# Patient Record
Sex: Female | Born: 1945 | Race: Black or African American | Hispanic: No | Marital: Married | Smoking: Never smoker
Health system: Southern US, Community
[De-identification: ages and names within clinical notes are randomized; demographics above are authoritative.]

## PROBLEM LIST (undated history)

## (undated) ENCOUNTER — Emergency Department: Discharge status: Absent without leave | Payer: Medicare Other

## (undated) DIAGNOSIS — M109 Gout, unspecified: Secondary | ICD-10-CM

## (undated) DIAGNOSIS — K5790 Diverticulosis of intestine, part unspecified, without perforation or abscess without bleeding: Secondary | ICD-10-CM

## (undated) DIAGNOSIS — K648 Other hemorrhoids: Secondary | ICD-10-CM

## (undated) DIAGNOSIS — I509 Heart failure, unspecified: Secondary | ICD-10-CM

## (undated) DIAGNOSIS — I639 Cerebral infarction, unspecified: Secondary | ICD-10-CM

## (undated) DIAGNOSIS — I5042 Chronic combined systolic (congestive) and diastolic (congestive) heart failure: Secondary | ICD-10-CM

## (undated) DIAGNOSIS — G629 Polyneuropathy, unspecified: Secondary | ICD-10-CM

## (undated) DIAGNOSIS — K219 Gastro-esophageal reflux disease without esophagitis: Secondary | ICD-10-CM

## (undated) DIAGNOSIS — Z8673 Personal history of transient ischemic attack (TIA), and cerebral infarction without residual deficits: Secondary | ICD-10-CM

## (undated) DIAGNOSIS — N189 Chronic kidney disease, unspecified: Secondary | ICD-10-CM

## (undated) DIAGNOSIS — E669 Obesity, unspecified: Secondary | ICD-10-CM

## (undated) DIAGNOSIS — M549 Dorsalgia, unspecified: Secondary | ICD-10-CM

## (undated) DIAGNOSIS — K449 Diaphragmatic hernia without obstruction or gangrene: Secondary | ICD-10-CM

## (undated) DIAGNOSIS — G4733 Obstructive sleep apnea (adult) (pediatric): Secondary | ICD-10-CM

## (undated) DIAGNOSIS — I10 Essential (primary) hypertension: Secondary | ICD-10-CM

## (undated) DIAGNOSIS — E119 Type 2 diabetes mellitus without complications: Secondary | ICD-10-CM

## (undated) DIAGNOSIS — I428 Other cardiomyopathies: Secondary | ICD-10-CM

## (undated) DIAGNOSIS — I219 Acute myocardial infarction, unspecified: Secondary | ICD-10-CM

## (undated) DIAGNOSIS — M2550 Pain in unspecified joint: Secondary | ICD-10-CM

## (undated) HISTORY — DX: Obesity, unspecified: E66.9

## (undated) HISTORY — DX: Personal history of transient ischemic attack (TIA), and cerebral infarction without residual deficits: Z86.73

## (undated) HISTORY — DX: Gout, unspecified: M10.9

## (undated) HISTORY — DX: Heart failure, unspecified: I50.9

## (undated) HISTORY — DX: Other cardiomyopathies: I42.8

## (undated) HISTORY — DX: Other hemorrhoids: K64.8

## (undated) HISTORY — DX: Chronic kidney disease, unspecified: N18.9

## (undated) HISTORY — DX: Pain in unspecified joint: M25.50

## (undated) HISTORY — DX: Cerebral infarction, unspecified: I63.9

## (undated) HISTORY — DX: Gastro-esophageal reflux disease without esophagitis: K21.9

## (undated) HISTORY — PX: EYE SURGERY: SHX253

## (undated) HISTORY — PX: CARDIAC CATHETERIZATION: SHX172

## (undated) HISTORY — DX: Diaphragmatic hernia without obstruction or gangrene: K44.9

## (undated) HISTORY — DX: Diverticulosis of intestine, part unspecified, without perforation or abscess without bleeding: K57.90

## (undated) HISTORY — DX: Obstructive sleep apnea (adult) (pediatric): G47.33

## (undated) HISTORY — DX: Polyneuropathy, unspecified: G62.9

## (undated) HISTORY — DX: Acute myocardial infarction, unspecified: I21.9

## (undated) HISTORY — DX: Dorsalgia, unspecified: M54.9

---

## 1972-04-26 HISTORY — PX: TUBAL LIGATION: SHX77

## 1997-11-15 ENCOUNTER — Other Ambulatory Visit: Admission: RE | Admit: 1997-11-15 | Discharge: 1997-11-15 | Payer: Self-pay | Admitting: Obstetrics and Gynecology

## 1998-03-25 ENCOUNTER — Other Ambulatory Visit: Admission: RE | Admit: 1998-03-25 | Discharge: 1998-03-25 | Payer: Self-pay | Admitting: Obstetrics and Gynecology

## 1998-04-26 HISTORY — PX: ABDOMINAL HYSTERECTOMY: SHX81

## 1998-12-30 ENCOUNTER — Ambulatory Visit (HOSPITAL_COMMUNITY): Admission: RE | Admit: 1998-12-30 | Discharge: 1998-12-30 | Payer: Self-pay | Admitting: Obstetrics and Gynecology

## 1998-12-30 ENCOUNTER — Encounter: Payer: Self-pay | Admitting: Obstetrics and Gynecology

## 1999-02-18 ENCOUNTER — Encounter (INDEPENDENT_AMBULATORY_CARE_PROVIDER_SITE_OTHER): Payer: Self-pay

## 1999-02-18 ENCOUNTER — Inpatient Hospital Stay (HOSPITAL_COMMUNITY): Admission: RE | Admit: 1999-02-18 | Discharge: 1999-02-20 | Payer: Self-pay | Admitting: Obstetrics and Gynecology

## 2000-01-04 ENCOUNTER — Encounter: Admission: RE | Admit: 2000-01-04 | Discharge: 2000-01-04 | Payer: Self-pay | Admitting: Obstetrics and Gynecology

## 2000-01-04 ENCOUNTER — Encounter: Payer: Self-pay | Admitting: Obstetrics and Gynecology

## 2000-01-12 ENCOUNTER — Encounter: Admission: RE | Admit: 2000-01-12 | Discharge: 2000-01-12 | Payer: Self-pay | Admitting: Obstetrics and Gynecology

## 2000-01-12 ENCOUNTER — Encounter: Payer: Self-pay | Admitting: Obstetrics and Gynecology

## 2000-05-19 ENCOUNTER — Encounter: Payer: Self-pay | Admitting: Endocrinology

## 2000-05-19 ENCOUNTER — Ambulatory Visit (HOSPITAL_COMMUNITY): Admission: RE | Admit: 2000-05-19 | Discharge: 2000-05-19 | Payer: Self-pay | Admitting: Endocrinology

## 2000-10-25 ENCOUNTER — Ambulatory Visit (HOSPITAL_COMMUNITY): Admission: RE | Admit: 2000-10-25 | Discharge: 2000-10-25 | Payer: Self-pay | Admitting: Endocrinology

## 2000-10-25 ENCOUNTER — Encounter: Payer: Self-pay | Admitting: Endocrinology

## 2001-01-13 ENCOUNTER — Encounter: Payer: Self-pay | Admitting: Obstetrics and Gynecology

## 2001-01-13 ENCOUNTER — Encounter: Admission: RE | Admit: 2001-01-13 | Discharge: 2001-01-13 | Payer: Self-pay | Admitting: Obstetrics and Gynecology

## 2001-01-26 ENCOUNTER — Encounter: Admission: RE | Admit: 2001-01-26 | Discharge: 2001-01-26 | Payer: Self-pay | Admitting: Endocrinology

## 2001-01-26 ENCOUNTER — Encounter: Payer: Self-pay | Admitting: Endocrinology

## 2001-03-14 ENCOUNTER — Other Ambulatory Visit: Admission: RE | Admit: 2001-03-14 | Discharge: 2001-03-14 | Payer: Self-pay | Admitting: Obstetrics and Gynecology

## 2002-01-30 ENCOUNTER — Encounter: Payer: Self-pay | Admitting: Endocrinology

## 2002-01-30 ENCOUNTER — Encounter: Admission: RE | Admit: 2002-01-30 | Discharge: 2002-01-30 | Payer: Self-pay | Admitting: Endocrinology

## 2002-02-21 ENCOUNTER — Encounter (INDEPENDENT_AMBULATORY_CARE_PROVIDER_SITE_OTHER): Payer: Self-pay | Admitting: *Deleted

## 2002-02-21 ENCOUNTER — Ambulatory Visit (HOSPITAL_COMMUNITY): Admission: RE | Admit: 2002-02-21 | Discharge: 2002-02-21 | Payer: Self-pay | Admitting: *Deleted

## 2002-11-26 ENCOUNTER — Encounter: Payer: Self-pay | Admitting: Orthopedic Surgery

## 2002-11-27 ENCOUNTER — Ambulatory Visit (HOSPITAL_COMMUNITY): Admission: RE | Admit: 2002-11-27 | Discharge: 2002-11-28 | Payer: Self-pay | Admitting: Orthopedic Surgery

## 2003-02-20 ENCOUNTER — Encounter: Admission: RE | Admit: 2003-02-20 | Discharge: 2003-02-20 | Payer: Self-pay | Admitting: Endocrinology

## 2003-04-27 HISTORY — PX: ACHILLES TENDON REPAIR: SUR1153

## 2003-04-27 HISTORY — PX: KNEE SURGERY: SHX244

## 2003-10-24 ENCOUNTER — Encounter: Admission: RE | Admit: 2003-10-24 | Discharge: 2003-10-24 | Payer: Self-pay

## 2003-12-03 ENCOUNTER — Observation Stay (HOSPITAL_COMMUNITY): Admission: RE | Admit: 2003-12-03 | Discharge: 2003-12-04 | Payer: Self-pay | Admitting: Orthopedic Surgery

## 2005-02-16 ENCOUNTER — Encounter: Admission: RE | Admit: 2005-02-16 | Discharge: 2005-02-16 | Payer: Self-pay | Admitting: Endocrinology

## 2005-10-07 ENCOUNTER — Encounter: Admission: RE | Admit: 2005-10-07 | Discharge: 2006-01-05 | Payer: Self-pay | Admitting: Surgery

## 2005-10-14 ENCOUNTER — Encounter (INDEPENDENT_AMBULATORY_CARE_PROVIDER_SITE_OTHER): Payer: Self-pay | Admitting: Specialist

## 2005-10-14 ENCOUNTER — Ambulatory Visit (HOSPITAL_COMMUNITY): Admission: RE | Admit: 2005-10-14 | Discharge: 2005-10-14 | Payer: Self-pay | Admitting: *Deleted

## 2005-10-15 ENCOUNTER — Ambulatory Visit (HOSPITAL_COMMUNITY): Admission: RE | Admit: 2005-10-15 | Discharge: 2005-10-15 | Payer: Self-pay | Admitting: Surgery

## 2005-10-18 ENCOUNTER — Ambulatory Visit (HOSPITAL_COMMUNITY): Admission: RE | Admit: 2005-10-18 | Discharge: 2005-10-18 | Payer: Self-pay | Admitting: Surgery

## 2005-11-15 ENCOUNTER — Ambulatory Visit (HOSPITAL_COMMUNITY): Admission: RE | Admit: 2005-11-15 | Discharge: 2005-11-15 | Payer: Self-pay | Admitting: Surgery

## 2005-12-29 ENCOUNTER — Ambulatory Visit (HOSPITAL_COMMUNITY): Admission: RE | Admit: 2005-12-29 | Discharge: 2005-12-29 | Payer: Self-pay | Admitting: *Deleted

## 2006-01-31 ENCOUNTER — Ambulatory Visit (HOSPITAL_COMMUNITY): Admission: RE | Admit: 2006-01-31 | Discharge: 2006-01-31 | Payer: Self-pay | Admitting: *Deleted

## 2006-11-25 ENCOUNTER — Encounter: Admission: RE | Admit: 2006-11-25 | Discharge: 2006-11-25 | Payer: Self-pay | Admitting: Endocrinology

## 2008-03-05 ENCOUNTER — Encounter: Admission: RE | Admit: 2008-03-05 | Discharge: 2008-04-25 | Payer: Self-pay

## 2008-04-29 ENCOUNTER — Encounter: Admission: RE | Admit: 2008-04-29 | Discharge: 2008-05-02 | Payer: Self-pay

## 2008-05-16 ENCOUNTER — Encounter: Admission: RE | Admit: 2008-05-16 | Discharge: 2008-05-16 | Payer: Self-pay | Admitting: Endocrinology

## 2008-07-09 ENCOUNTER — Ambulatory Visit: Payer: Self-pay | Admitting: Pulmonary Disease

## 2008-07-09 ENCOUNTER — Ambulatory Visit: Payer: Self-pay | Admitting: Cardiology

## 2008-07-09 ENCOUNTER — Inpatient Hospital Stay (HOSPITAL_COMMUNITY): Admission: EM | Admit: 2008-07-09 | Discharge: 2008-07-18 | Payer: Self-pay | Admitting: Emergency Medicine

## 2008-07-10 ENCOUNTER — Encounter (INDEPENDENT_AMBULATORY_CARE_PROVIDER_SITE_OTHER): Payer: Self-pay | Admitting: Internal Medicine

## 2008-11-24 DIAGNOSIS — Z8673 Personal history of transient ischemic attack (TIA), and cerebral infarction without residual deficits: Secondary | ICD-10-CM

## 2008-11-24 HISTORY — DX: Personal history of transient ischemic attack (TIA), and cerebral infarction without residual deficits: Z86.73

## 2008-12-12 ENCOUNTER — Encounter: Admission: RE | Admit: 2008-12-12 | Discharge: 2008-12-12 | Payer: Self-pay | Admitting: Endocrinology

## 2009-04-28 ENCOUNTER — Encounter: Admission: RE | Admit: 2009-04-28 | Discharge: 2009-04-28 | Payer: Self-pay | Admitting: Endocrinology

## 2009-06-04 ENCOUNTER — Emergency Department (HOSPITAL_COMMUNITY): Admission: EM | Admit: 2009-06-04 | Discharge: 2009-06-04 | Payer: Self-pay | Admitting: Emergency Medicine

## 2009-12-17 ENCOUNTER — Ambulatory Visit: Payer: Self-pay | Admitting: Cardiology

## 2009-12-17 ENCOUNTER — Telehealth (INDEPENDENT_AMBULATORY_CARE_PROVIDER_SITE_OTHER): Payer: Self-pay | Admitting: *Deleted

## 2010-01-26 ENCOUNTER — Ambulatory Visit: Payer: Self-pay | Admitting: Cardiology

## 2010-01-28 IMAGING — CR DG CHEST 2V
2 series · 2 of 2 positions shown · non-contrast
Comparison: 07/13/2008.

CLINICAL DATA: Respiratory failure.

CHEST - 2 VIEW

[w chest pa]
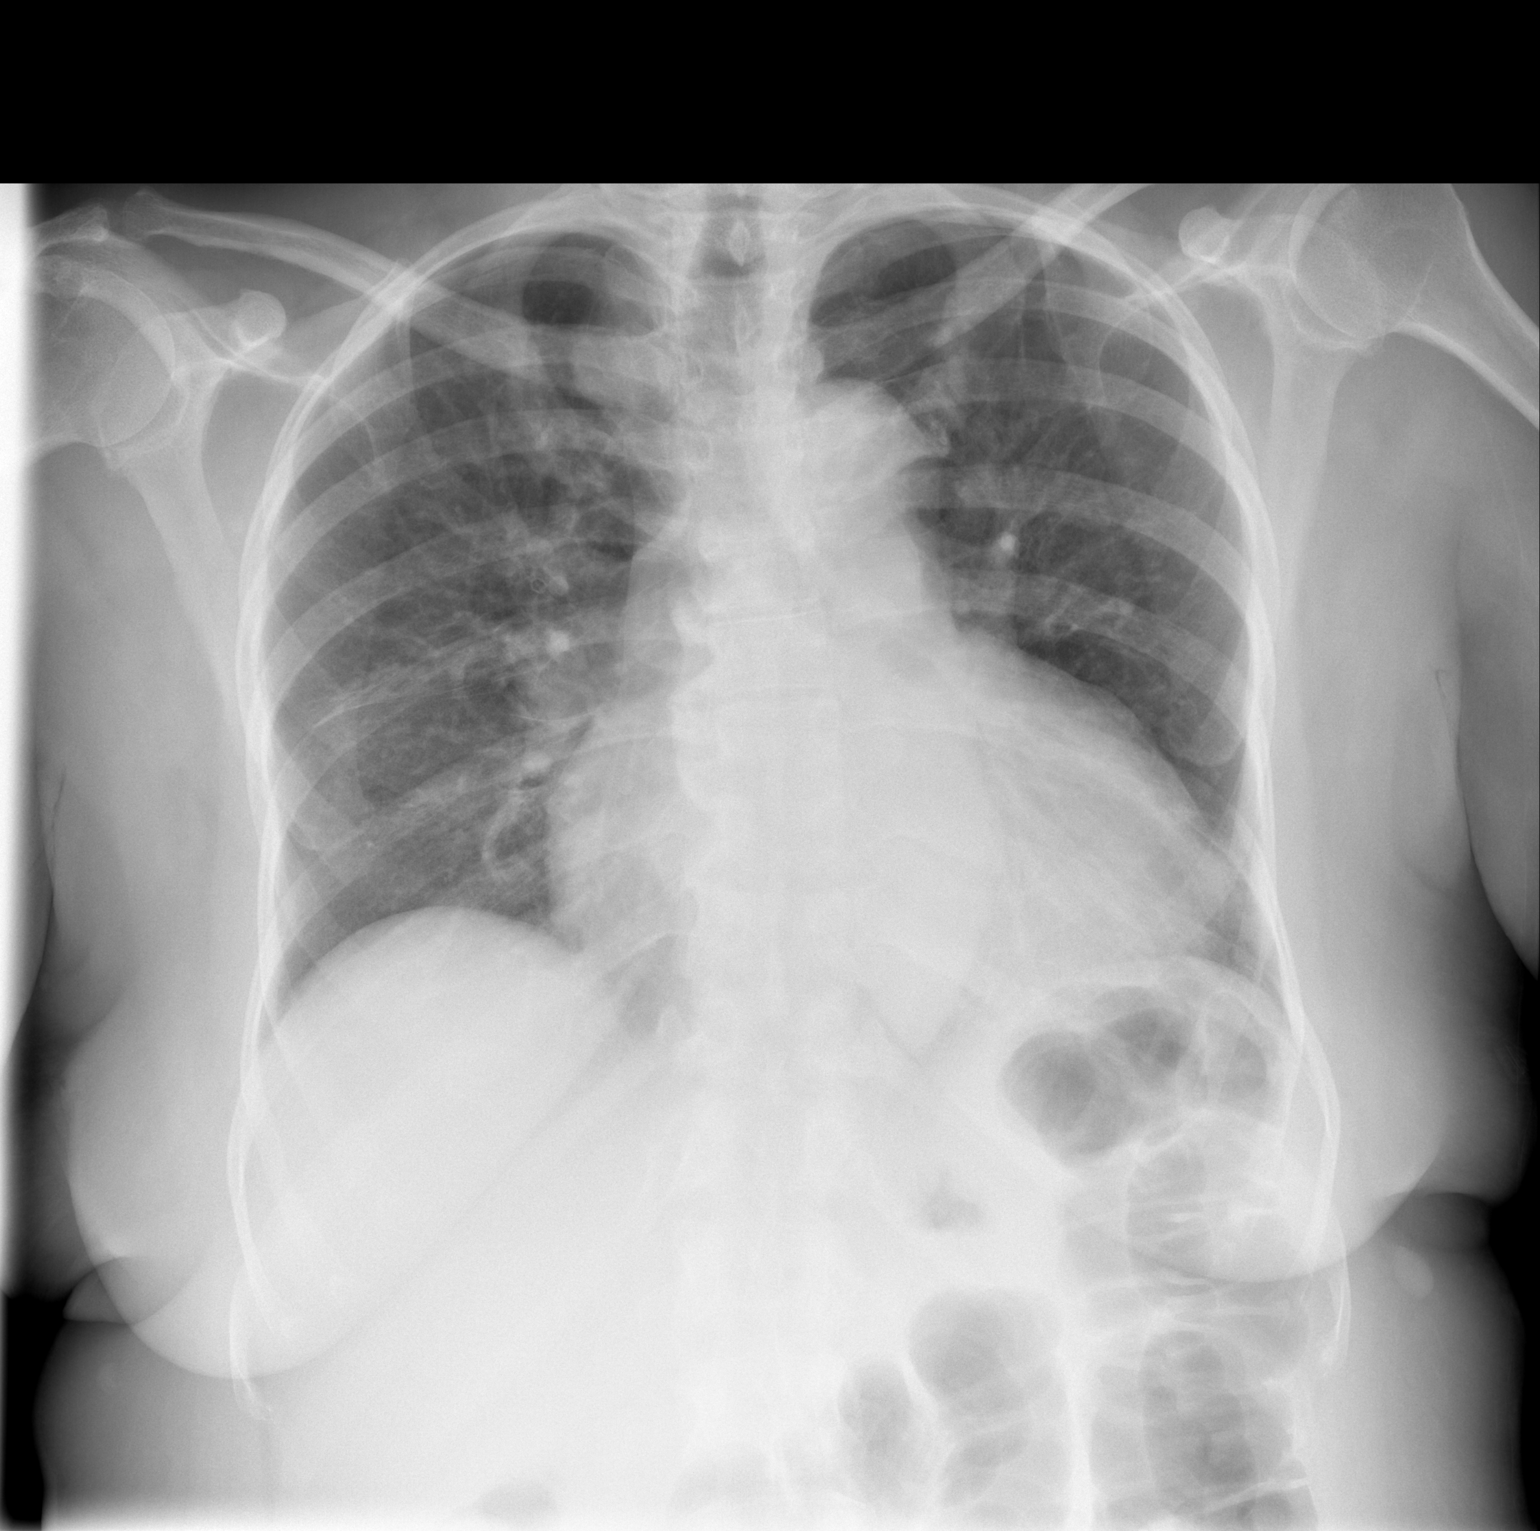

[w chest lat]
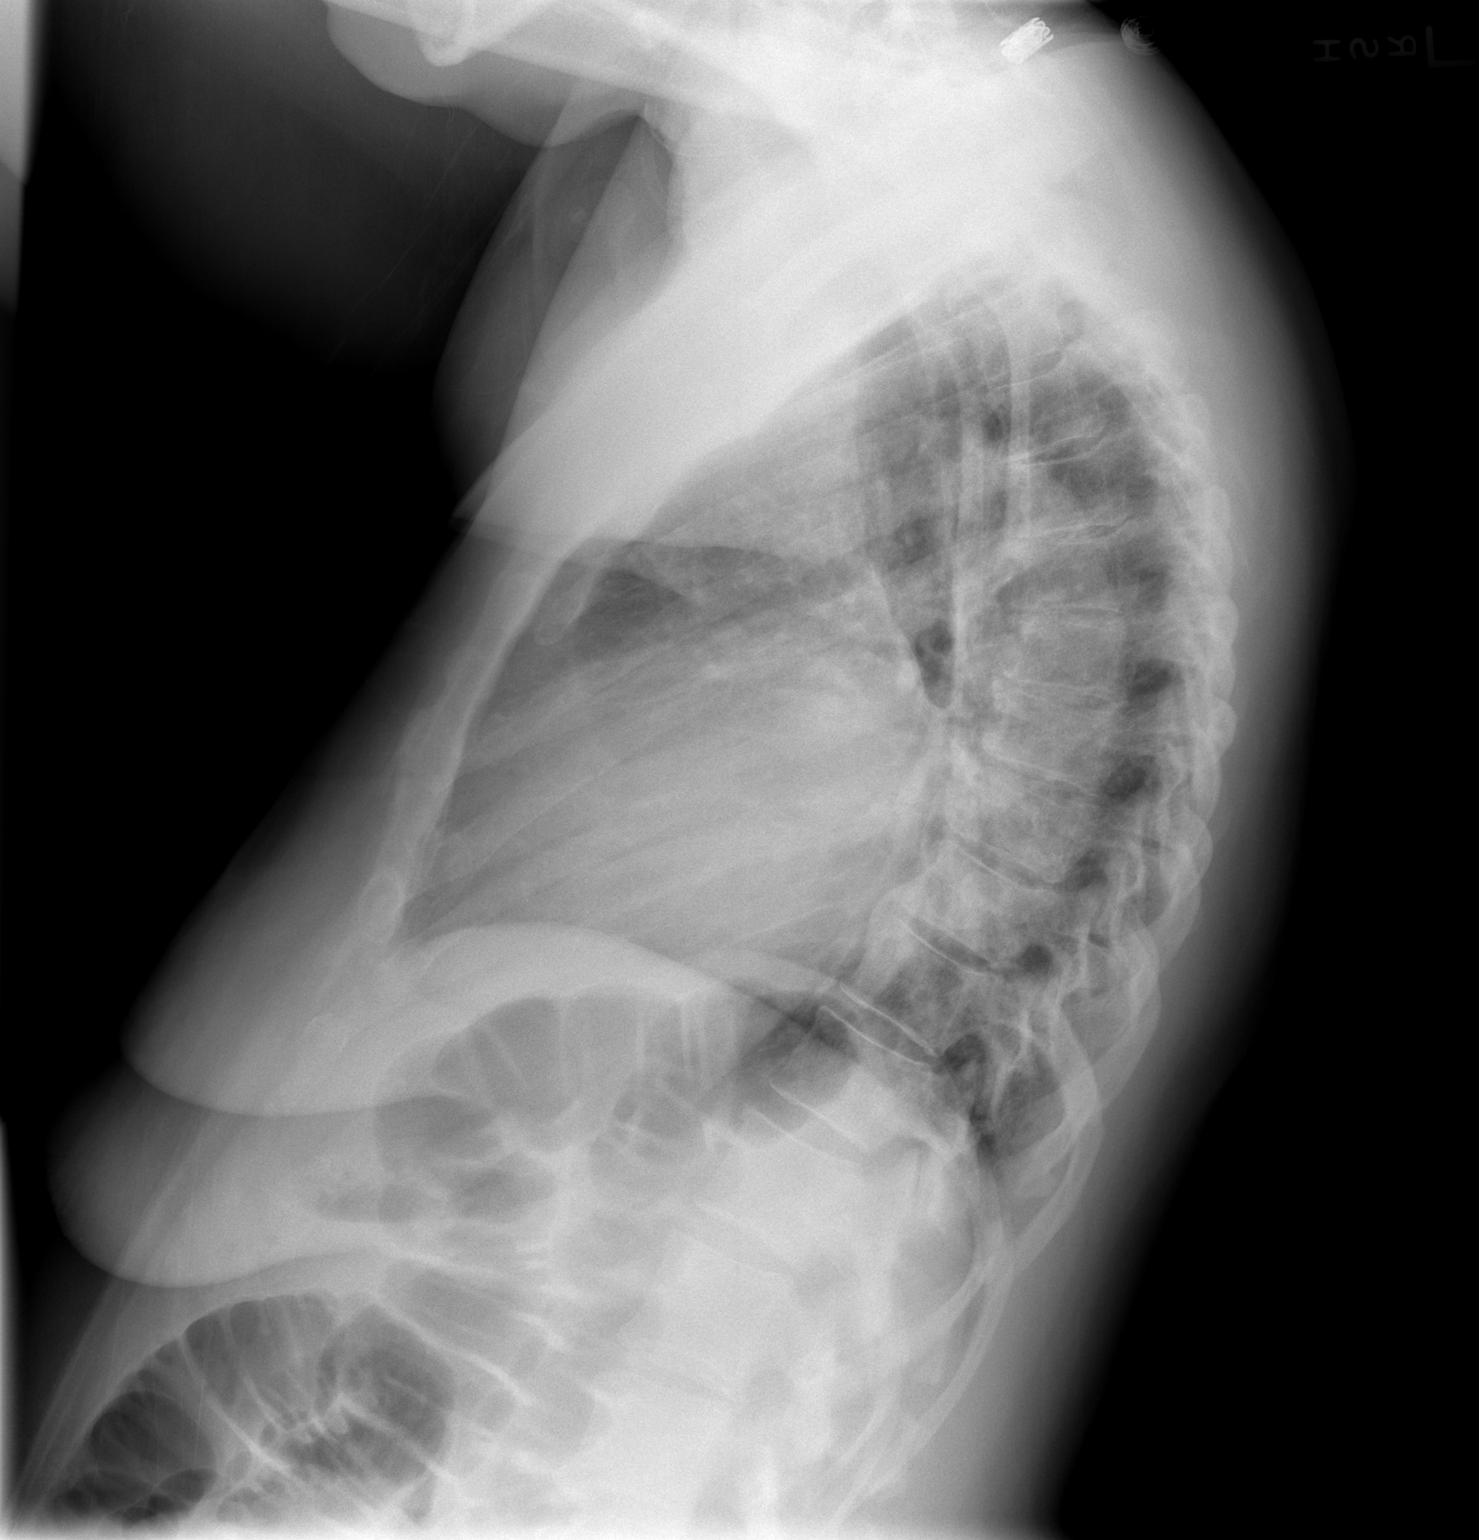

[2 of 2 positions shown; findings below may reference images not displayed]

FINDINGS: The heart is enlarged.  There is improvement in vascular
congestion.  There is no infiltrate or edema.  Negative for pleural
effusion.
IMPRESSION: Interval improvement in cardiac enlargement  and  pulmonary
vascular congestion.

## 2010-01-29 ENCOUNTER — Telehealth (INDEPENDENT_AMBULATORY_CARE_PROVIDER_SITE_OTHER): Payer: Self-pay | Admitting: Radiology

## 2010-01-30 IMAGING — CR DG CHEST 1V PORT
1 series · 1 of 1 positions shown · non-contrast
Comparison: 07/15/2008.

CLINICAL DATA: Respiratory failure.

PORTABLE CHEST - 1 VIEW

[AP]
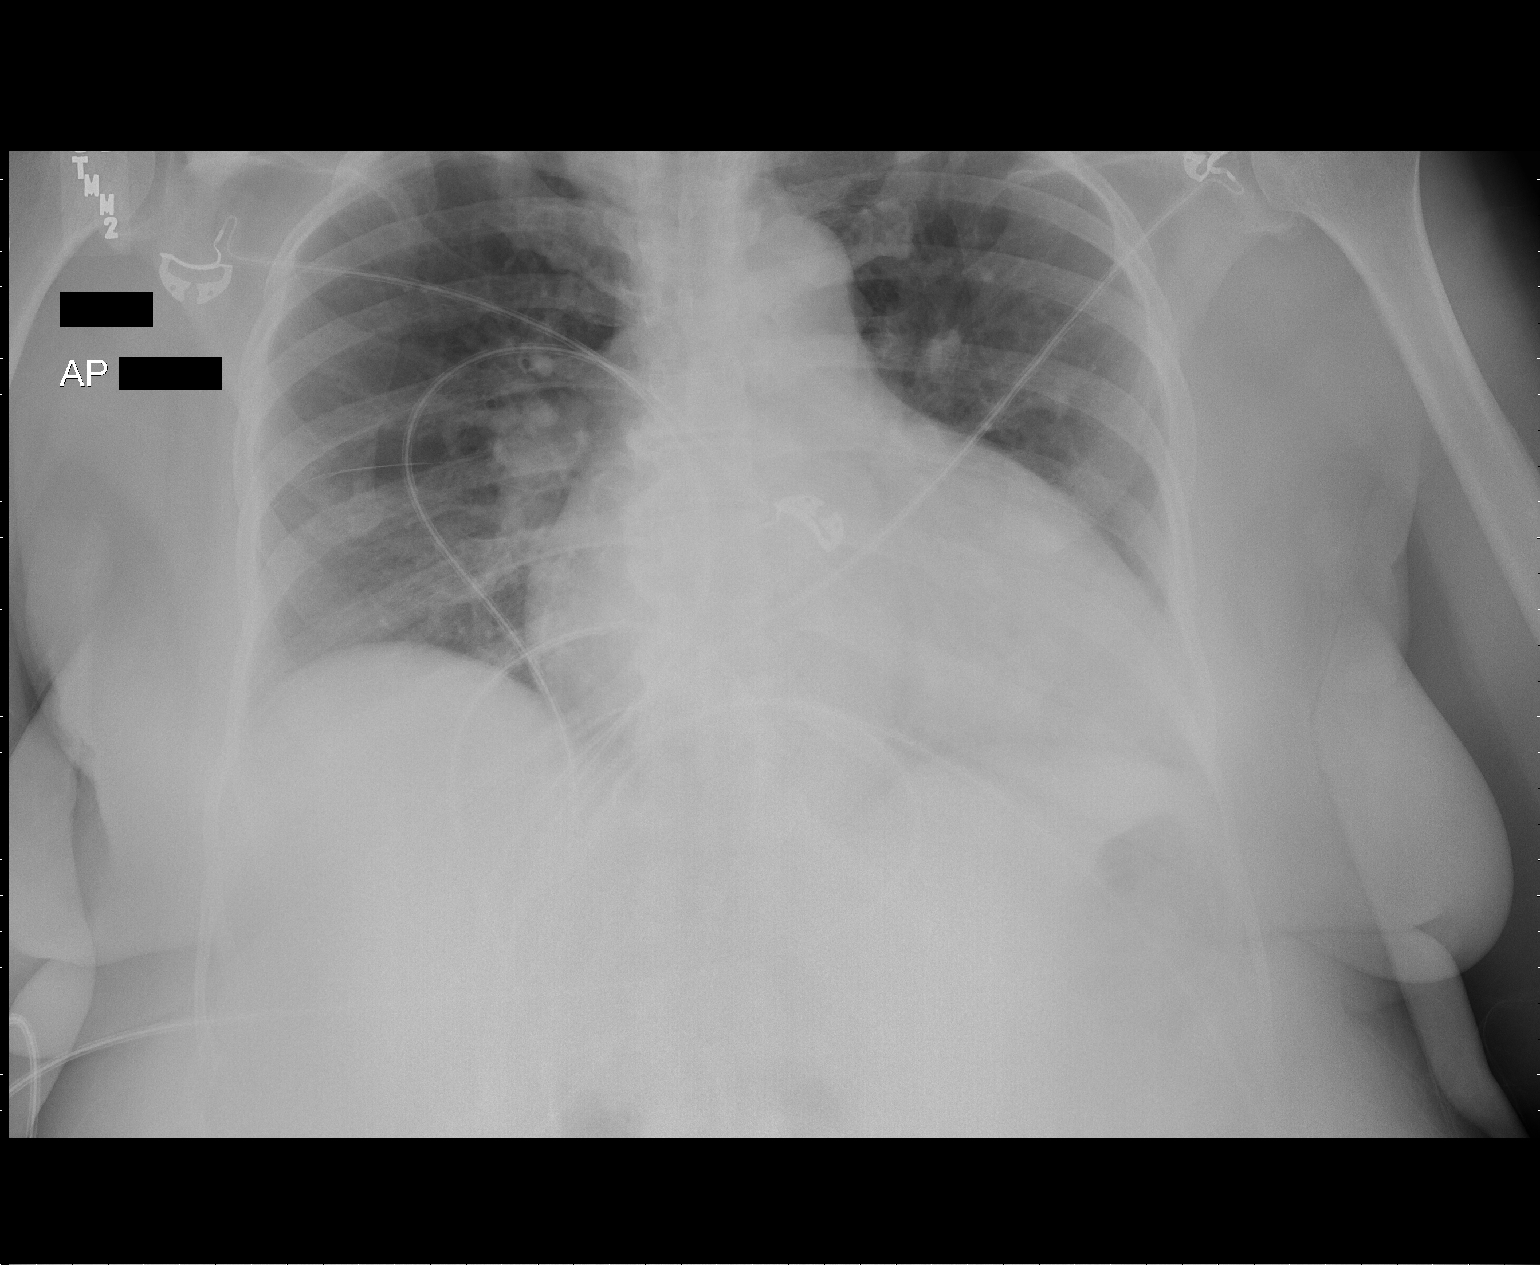

[1 of 1 positions shown; findings below may reference images not displayed]

FINDINGS: The cardiopericardial silhouette is enlarged.  Lung
volumes are low.  Moderate pulmonary vascular congestion is not
significantly changed.  No focal airspace disease is seen.
IMPRESSION: 1.  Stable cardiomegaly and moderate pulmonary vascular congestion.
2.  Low lung volumes.

## 2010-02-02 ENCOUNTER — Encounter: Payer: Self-pay | Admitting: Cardiology

## 2010-02-02 ENCOUNTER — Encounter (HOSPITAL_COMMUNITY)
Admission: RE | Admit: 2010-02-02 | Discharge: 2010-02-20 | Payer: Self-pay | Source: Home / Self Care | Admitting: Cardiology

## 2010-02-02 ENCOUNTER — Ambulatory Visit: Payer: Self-pay

## 2010-02-02 ENCOUNTER — Ambulatory Visit: Payer: Self-pay | Admitting: Internal Medicine

## 2010-02-05 ENCOUNTER — Ambulatory Visit: Payer: Self-pay

## 2010-02-23 ENCOUNTER — Ambulatory Visit: Payer: Self-pay | Admitting: Cardiology

## 2010-03-02 ENCOUNTER — Ambulatory Visit: Payer: Self-pay | Admitting: Cardiology

## 2010-03-10 ENCOUNTER — Ambulatory Visit: Payer: Self-pay | Admitting: Cardiology

## 2010-03-23 ENCOUNTER — Ambulatory Visit: Payer: Self-pay | Admitting: Cardiology

## 2010-05-18 ENCOUNTER — Encounter: Payer: Self-pay | Admitting: Endocrinology

## 2010-05-26 NOTE — Progress Notes (Signed)
  Recieved Records from [facility] gave to [place]  December 17, 2009 8:04 AM     Appended Document:  Records Recieved from [facility] gave to [place]:  These records are supposed to go to [facility] not here, faxed over to [facility]

## 2010-05-26 NOTE — Progress Notes (Signed)
Summary: nuc pre-procedure  Phone Note Outgoing Call   Call placed by: Charlton Amor, CNMT,  January 29, 2010 3:32 PM Call placed to: Patient Summary of Call: Left message with information on Myoview Information Sheet (see scanned document for details).  Initial call taken by: Charlton Amor, CNMT,  January 29, 2010 3:32 PM     Nuclear Med Background Indications for Stress Test: Evaluation for Ischemia, Surgical Clearance   History: Heart Catheterization, Myocardial Infarction  History Comments: 3/10 MI / Cath totalled LAD / CFX Med Tx CHF     Nuclear Pre-Procedure Cardiac Risk Factors: CVA, Hypertension

## 2010-05-26 NOTE — Assessment & Plan Note (Signed)
Summary: Cardiology Nuclear Testing  Nuclear Med Background Indications for Stress Test: Evaluation for Ischemia, Surgical Clearance   History: Heart Catheterization, Myocardial Infarction  History Comments: 3/10 MI / Cath totalled LAD / CFX Med Tx CHF     Nuclear Pre-Procedure Cardiac Risk Factors: CVA, Hypertension Caffeine/Decaff Intake: None NPO After: 8:30 PM IV 0.9% NS with Angio Cath: 22g     IV Site: R Antecubital IV Started by: Irven Baltimore, RN Chest Size (in) 42     Cup Size B     Height (in): 62.5 Weight (lb): 255 BMI: 46.06 Tech Comments: Held carvedilol this am.  Nuclear Med Study 1 or 2 day study:  2 day     Stress Test Type:  Carlton Adam Reading MD:  Kirk Ruths, MD     Referring MD:  Bea Laura Resting Radionuclide:  Technetium 25m Tetrofosmin     Resting Radionuclide Dose:  32.1 mCi  Stress Radionuclide:  Technetium 91m Tetrofosmin     Stress Radionuclide Dose:  33 mCi   Stress Protocol     Stress Test Technologist:  Ileene Hutchinson, EMT-P     Nuclear Technologist:  Annye Rusk, CNMT  Rest Procedure  Myocardial perfusion imaging was performed at rest 45 minutes following the intravenous administration of Technetium 86m Tetrofosmin.  Stress Procedure  The patient received IV Lexiscan 0.4 mg over 15-seconds.  Technetium 43m Tetrofosmin injected at 30-seconds.  There were nonspecific ST-T wave changes with infusion.  Quantitative spect images were obtained after a 45 minute delay.  QPS Raw Data Images:  There is no interference from other nuclear activity. Stress Images:  There is decreased uptake in the inferior wall Rest Images:  There is decreased uptake in the inferior wall, less prominent compared to the stress images. Subtraction (SDS):  These findings are consistent with prior inferior infarct and very mild peri-infarct ischemia. Transient Ischemic Dilatation:  .94  (Normal <1.22)  Lung/Heart Ratio:  .40  (Normal <0.45)  Quantitative Gated  Spect Images QGS EDV:  179 ml QGS ESV:  122 ml QGS EF:  32 % QGS cine images:  Inferior akinesis; evidence of left ventricular enlargement.   Overall Impression  Exercise Capacity: Lexiscan with no exercise. BP Response: Normal blood pressure response. Clinical Symptoms: No chest pain ECG Impression: No significant ST segment change suggestive of ischemia. Overall Impression: Abnormal lexiscan nuclear study with prior inferior infarct and very mild peri-infarct ischemia.  Appended Document: Cardiology Nuclear Testing copy sent to Dr. Doreatha Lew

## 2010-07-08 ENCOUNTER — Ambulatory Visit (INDEPENDENT_AMBULATORY_CARE_PROVIDER_SITE_OTHER): Payer: Federal, State, Local not specified - PPO | Admitting: Cardiology

## 2010-07-08 DIAGNOSIS — R0602 Shortness of breath: Secondary | ICD-10-CM

## 2010-07-08 DIAGNOSIS — I2510 Atherosclerotic heart disease of native coronary artery without angina pectoris: Secondary | ICD-10-CM

## 2010-08-06 LAB — COMPREHENSIVE METABOLIC PANEL
ALT: 605 U/L — ABNORMAL HIGH (ref 0–35)
AST: 1102 U/L — ABNORMAL HIGH (ref 0–37)
AST: 472 U/L — ABNORMAL HIGH (ref 0–37)
Albumin: 2.7 g/dL — ABNORMAL LOW (ref 3.5–5.2)
Albumin: 3 g/dL — ABNORMAL LOW (ref 3.5–5.2)
Alkaline Phosphatase: 80 U/L (ref 39–117)
Alkaline Phosphatase: 84 U/L (ref 39–117)
BUN: 19 mg/dL (ref 6–23)
BUN: 26 mg/dL — ABNORMAL HIGH (ref 6–23)
CO2: 29 mEq/L (ref 19–32)
Calcium: 7 mg/dL — ABNORMAL LOW (ref 8.4–10.5)
Calcium: 7.9 mg/dL — ABNORMAL LOW (ref 8.4–10.5)
Chloride: 105 mEq/L (ref 96–112)
Creatinine, Ser: 1.53 mg/dL — ABNORMAL HIGH (ref 0.4–1.2)
Creatinine, Ser: 1.63 mg/dL — ABNORMAL HIGH (ref 0.4–1.2)
GFR calc Af Amer: 39 mL/min — ABNORMAL LOW (ref >60)
GFR calc Af Amer: 41 mL/min — ABNORMAL LOW (ref >60)
GFR calc non Af Amer: 32 mL/min — ABNORMAL LOW (ref >60)
Glucose, Bld: 122 mg/dL — ABNORMAL HIGH (ref 70–99)
Glucose, Bld: 417 mg/dL — ABNORMAL HIGH (ref 70–99)
Potassium: 3.5 mEq/L (ref 3.5–5.1)
Potassium: 5.1 mEq/L (ref 3.5–5.1)
Sodium: 142 mEq/L (ref 135–145)
Total Bilirubin: 0.5 mg/dL (ref 0.3–1.2)
Total Protein: 6.3 g/dL (ref 6.0–8.3)
Total Protein: 6.4 g/dL (ref 6.0–8.3)

## 2010-08-06 LAB — HEPARIN LEVEL (UNFRACTIONATED)
Heparin Unfractionated: 0.29 IU/mL — ABNORMAL LOW (ref 0.30–0.70)
Heparin Unfractionated: 0.38 IU/mL (ref 0.30–0.70)
Heparin Unfractionated: 0.39 IU/mL (ref 0.30–0.70)
Heparin Unfractionated: 0.52 IU/mL (ref 0.30–0.70)
Heparin Unfractionated: 0.63 IU/mL (ref 0.30–0.70)
Heparin Unfractionated: <0.1 IU/mL — ABNORMAL LOW (ref 0.30–0.70)

## 2010-08-06 LAB — URINE CULTURE
Colony Count: 2000
Culture: NO GROWTH

## 2010-08-06 LAB — POCT I-STAT 3, ART BLOOD GAS (G3+)
Acid-Base Excess: 2 mmol/L (ref 0.0–2.0)
Acid-Base Excess: 3 mmol/L — ABNORMAL HIGH (ref 0.0–2.0)
Acid-Base Excess: 3 mmol/L — ABNORMAL HIGH (ref 0.0–2.0)
Acid-Base Excess: 4 mmol/L — ABNORMAL HIGH (ref 0.0–2.0)
Bicarbonate: 11.7 mEq/L — ABNORMAL LOW (ref 20.0–24.0)
Bicarbonate: 22.8 mEq/L (ref 20.0–24.0)
Bicarbonate: 26.1 mEq/L — ABNORMAL HIGH (ref 20.0–24.0)
Bicarbonate: 27.9 mEq/L — ABNORMAL HIGH (ref 20.0–24.0)
O2 Saturation: 100 %
O2 Saturation: 97 %
O2 Saturation: 98 %
O2 Saturation: 99 %
O2 Saturation: 99 %
Patient temperature: 98.2
Patient temperature: 99
TCO2: 12 mmol/L (ref 0–100)
TCO2: 24 mmol/L (ref 0–100)
TCO2: 29 mmol/L (ref 0–100)
TCO2: 30 mmol/L (ref 0–100)
pCO2 arterial: 44.6 mmHg (ref 35.0–45.0)
pCO2 arterial: 55.3 mmHg — ABNORMAL HIGH (ref 35.0–45.0)
pH, Arterial: 7.193 — CL (ref 7.350–7.400)
pO2, Arterial: 110 mmHg — ABNORMAL HIGH (ref 80.0–100.0)
pO2, Arterial: 129 mmHg — ABNORMAL HIGH (ref 80.0–100.0)
pO2, Arterial: 145 mmHg — ABNORMAL HIGH (ref 80.0–100.0)
pO2, Arterial: 166 mmHg — ABNORMAL HIGH (ref 80.0–100.0)
pO2, Arterial: 33 mmHg — CL (ref 80.0–100.0)

## 2010-08-06 LAB — URINALYSIS, ROUTINE W REFLEX MICROSCOPIC
Bilirubin Urine: NEGATIVE
Bilirubin Urine: NEGATIVE
Glucose, UA: 250 mg/dL — AB
Glucose, UA: NEGATIVE mg/dL
Ketones, ur: NEGATIVE mg/dL
Nitrite: NEGATIVE
Specific Gravity, Urine: 1.009 (ref 1.005–1.030)
Urobilinogen, UA: 0.2 mg/dL (ref 0.0–1.0)
pH: 6 (ref 5.0–8.0)

## 2010-08-06 LAB — CBC
HCT: 25.9 % — ABNORMAL LOW (ref 36.0–46.0)
HCT: 28.7 % — ABNORMAL LOW (ref 36.0–46.0)
HCT: 30.4 % — ABNORMAL LOW (ref 36.0–46.0)
HCT: 31.9 % — ABNORMAL LOW (ref 36.0–46.0)
HCT: 33.1 % — ABNORMAL LOW (ref 36.0–46.0)
Hemoglobin: 10.7 g/dL — ABNORMAL LOW (ref 12.0–15.0)
Hemoglobin: 10.9 g/dL — ABNORMAL LOW (ref 12.0–15.0)
Hemoglobin: 11.2 g/dL — ABNORMAL LOW (ref 12.0–15.0)
Hemoglobin: 8.7 g/dL — ABNORMAL LOW (ref 12.0–15.0)
Hemoglobin: 9.4 g/dL — ABNORMAL LOW (ref 12.0–15.0)
Hemoglobin: 9.8 g/dL — ABNORMAL LOW (ref 12.0–15.0)
MCHC: 32 g/dL (ref 30.0–36.0)
MCHC: 32 g/dL (ref 30.0–36.0)
MCHC: 32.3 g/dL (ref 30.0–36.0)
MCHC: 32.4 g/dL (ref 30.0–36.0)
MCHC: 32.6 g/dL (ref 30.0–36.0)
MCHC: 32.9 g/dL (ref 30.0–36.0)
MCHC: 33.2 g/dL (ref 30.0–36.0)
MCHC: 35 g/dL (ref 30.0–36.0)
MCV: 75.9 fL — ABNORMAL LOW (ref 78.0–100.0)
MCV: 76.3 fL — ABNORMAL LOW (ref 78.0–100.0)
MCV: 76.3 fL — ABNORMAL LOW (ref 78.0–100.0)
MCV: 76.4 fL — ABNORMAL LOW (ref 78.0–100.0)
MCV: 76.7 fL — ABNORMAL LOW (ref 78.0–100.0)
MCV: 77.5 fL — ABNORMAL LOW (ref 78.0–100.0)
Platelets: 152 10*3/uL (ref 150–400)
Platelets: 190 10*3/uL (ref 150–400)
Platelets: 197 10*3/uL (ref 150–400)
Platelets: 237 10*3/uL (ref 150–400)
Platelets: 281 10*3/uL (ref 150–400)
Platelets: 347 10*3/uL (ref 150–400)
RBC: 3.54 MIL/uL — ABNORMAL LOW (ref 3.87–5.11)
RBC: 3.78 MIL/uL — ABNORMAL LOW (ref 3.87–5.11)
RBC: 4.21 MIL/uL (ref 3.87–5.11)
RBC: 4.28 MIL/uL (ref 3.87–5.11)
RDW: 17.9 % — ABNORMAL HIGH (ref 11.5–15.5)
RDW: 18 % — ABNORMAL HIGH (ref 11.5–15.5)
RDW: 18.5 % — ABNORMAL HIGH (ref 11.5–15.5)
RDW: 18.7 % — ABNORMAL HIGH (ref 11.5–15.5)
RDW: 18.9 % — ABNORMAL HIGH (ref 11.5–15.5)
WBC: 10.5 10*3/uL (ref 4.0–10.5)
WBC: 11.3 10*3/uL — ABNORMAL HIGH (ref 4.0–10.5)
WBC: 9.2 10*3/uL (ref 4.0–10.5)
WBC: 9.6 10*3/uL (ref 4.0–10.5)

## 2010-08-06 LAB — CARDIAC PANEL(CRET KIN+CKTOT+MB+TROPI)
CK, MB: 2 ng/mL (ref 0.3–4.0)
CK, MB: 2 ng/mL (ref 0.3–4.0)
Relative Index: 1.7 (ref 0.0–2.5)
Relative Index: 1.8 (ref 0.0–2.5)
Total CK: 109 U/L (ref 7–177)
Total CK: 118 U/L (ref 7–177)
Total CK: 2033 U/L — ABNORMAL HIGH (ref 7–177)
Total CK: 2346 U/L — ABNORMAL HIGH (ref 7–177)
Total CK: 2430 U/L — ABNORMAL HIGH (ref 7–177)
Troponin I: 0.27 ng/mL — ABNORMAL HIGH (ref 0.00–0.06)
Troponin I: 30.68 ng/mL (ref 0.00–0.06)
Troponin I: 87.74 ng/mL (ref 0.00–0.06)

## 2010-08-06 LAB — GLUCOSE, CAPILLARY
Glucose-Capillary: 103 mg/dL — ABNORMAL HIGH (ref 70–99)
Glucose-Capillary: 113 mg/dL — ABNORMAL HIGH (ref 70–99)
Glucose-Capillary: 129 mg/dL — ABNORMAL HIGH (ref 70–99)
Glucose-Capillary: 133 mg/dL — ABNORMAL HIGH (ref 70–99)
Glucose-Capillary: 148 mg/dL — ABNORMAL HIGH (ref 70–99)
Glucose-Capillary: 148 mg/dL — ABNORMAL HIGH (ref 70–99)
Glucose-Capillary: 150 mg/dL — ABNORMAL HIGH (ref 70–99)
Glucose-Capillary: 153 mg/dL — ABNORMAL HIGH (ref 70–99)
Glucose-Capillary: 154 mg/dL — ABNORMAL HIGH (ref 70–99)
Glucose-Capillary: 155 mg/dL — ABNORMAL HIGH (ref 70–99)
Glucose-Capillary: 156 mg/dL — ABNORMAL HIGH (ref 70–99)
Glucose-Capillary: 160 mg/dL — ABNORMAL HIGH (ref 70–99)
Glucose-Capillary: 165 mg/dL — ABNORMAL HIGH (ref 70–99)
Glucose-Capillary: 170 mg/dL — ABNORMAL HIGH (ref 70–99)
Glucose-Capillary: 172 mg/dL — ABNORMAL HIGH (ref 70–99)
Glucose-Capillary: 180 mg/dL — ABNORMAL HIGH (ref 70–99)
Glucose-Capillary: 188 mg/dL — ABNORMAL HIGH (ref 70–99)
Glucose-Capillary: 190 mg/dL — ABNORMAL HIGH (ref 70–99)
Glucose-Capillary: 195 mg/dL — ABNORMAL HIGH (ref 70–99)
Glucose-Capillary: 202 mg/dL — ABNORMAL HIGH (ref 70–99)
Glucose-Capillary: 204 mg/dL — ABNORMAL HIGH (ref 70–99)
Glucose-Capillary: 223 mg/dL — ABNORMAL HIGH (ref 70–99)
Glucose-Capillary: 231 mg/dL — ABNORMAL HIGH (ref 70–99)
Glucose-Capillary: 246 mg/dL — ABNORMAL HIGH (ref 70–99)
Glucose-Capillary: 262 mg/dL — ABNORMAL HIGH (ref 70–99)

## 2010-08-06 LAB — CULTURE, BLOOD (ROUTINE X 2)
Culture: NO GROWTH
Culture: NO GROWTH
Culture: NO GROWTH

## 2010-08-06 LAB — LEGIONELLA ANTIGEN, URINE: Legionella Antigen, Urine: NEGATIVE

## 2010-08-06 LAB — BASIC METABOLIC PANEL
BUN: 19 mg/dL (ref 6–23)
BUN: 20 mg/dL (ref 6–23)
CO2: 25 mEq/L (ref 19–32)
CO2: 26 mEq/L (ref 19–32)
CO2: 26 mEq/L (ref 19–32)
CO2: 27 mEq/L (ref 19–32)
CO2: 30 mEq/L (ref 19–32)
Calcium: 7.3 mg/dL — ABNORMAL LOW (ref 8.4–10.5)
Calcium: 7.6 mg/dL — ABNORMAL LOW (ref 8.4–10.5)
Calcium: 7.9 mg/dL — ABNORMAL LOW (ref 8.4–10.5)
Calcium: 8.4 mg/dL (ref 8.4–10.5)
Chloride: 101 mEq/L (ref 96–112)
Chloride: 104 mEq/L (ref 96–112)
Chloride: 97 mEq/L (ref 96–112)
Chloride: 99 mEq/L (ref 96–112)
Creatinine, Ser: 1.14 mg/dL (ref 0.4–1.2)
Creatinine, Ser: 1.14 mg/dL (ref 0.4–1.2)
Creatinine, Ser: 1.14 mg/dL (ref 0.4–1.2)
Creatinine, Ser: 1.2 mg/dL (ref 0.4–1.2)
Creatinine, Ser: 1.27 mg/dL — ABNORMAL HIGH (ref 0.4–1.2)
GFR calc Af Amer: 39 mL/min — ABNORMAL LOW (ref >60)
GFR calc Af Amer: 57 mL/min — ABNORMAL LOW (ref >60)
GFR calc Af Amer: 59 mL/min — ABNORMAL LOW (ref >60)
GFR calc Af Amer: 59 mL/min — ABNORMAL LOW (ref >60)
GFR calc non Af Amer: 43 mL/min — ABNORMAL LOW (ref >60)
GFR calc non Af Amer: 48 mL/min — ABNORMAL LOW (ref >60)
GFR calc non Af Amer: 48 mL/min — ABNORMAL LOW (ref >60)
Glucose, Bld: 147 mg/dL — ABNORMAL HIGH (ref 70–99)
Glucose, Bld: 167 mg/dL — ABNORMAL HIGH (ref 70–99)
Glucose, Bld: 214 mg/dL — ABNORMAL HIGH (ref 70–99)
Potassium: 3 mEq/L — ABNORMAL LOW (ref 3.5–5.1)
Potassium: 3.1 mEq/L — ABNORMAL LOW (ref 3.5–5.1)
Potassium: 3.5 mEq/L (ref 3.5–5.1)
Potassium: 3.9 mEq/L (ref 3.5–5.1)
Sodium: 133 mEq/L — ABNORMAL LOW (ref 135–145)
Sodium: 133 mEq/L — ABNORMAL LOW (ref 135–145)
Sodium: 138 mEq/L (ref 135–145)
Sodium: 142 mEq/L (ref 135–145)

## 2010-08-06 LAB — CULTURE, RESPIRATORY W GRAM STAIN: Culture: NO GROWTH

## 2010-08-06 LAB — PROTIME-INR: Prothrombin Time: 13.3 seconds (ref 11.6–15.2)

## 2010-08-06 LAB — BRAIN NATRIURETIC PEPTIDE
Pro B Natriuretic peptide (BNP): 1118 pg/mL — ABNORMAL HIGH (ref 0.0–100.0)
Pro B Natriuretic peptide (BNP): 123 pg/mL — ABNORMAL HIGH (ref 0.0–100.0)
Pro B Natriuretic peptide (BNP): 2964 pg/mL — ABNORMAL HIGH (ref 0.0–100.0)
Pro B Natriuretic peptide (BNP): 716 pg/mL — ABNORMAL HIGH (ref 0.0–100.0)

## 2010-08-06 LAB — URINE MICROSCOPIC-ADD ON

## 2010-08-06 LAB — HEPATITIS PANEL, ACUTE
HCV Ab: NEGATIVE
Hepatitis B Surface Ag: NEGATIVE

## 2010-08-06 LAB — DIFFERENTIAL
Basophils Absolute: 0 10*3/uL (ref 0.0–0.1)
Basophils Relative: 0 % (ref 0–1)
Lymphocytes Relative: 15 % (ref 12–46)
Lymphocytes Relative: 18 % (ref 12–46)
Monocytes Absolute: 0.5 10*3/uL (ref 0.1–1.0)
Monocytes Relative: 3 % (ref 3–12)
Monocytes Relative: 6 % (ref 3–12)
Neutro Abs: 6.2 10*3/uL (ref 1.7–7.7)
Neutro Abs: 8.3 10*3/uL — ABNORMAL HIGH (ref 1.7–7.7)
Neutrophils Relative %: 79 % — ABNORMAL HIGH (ref 43–77)

## 2010-08-06 LAB — CROSSMATCH: ABO/RH(D): AB POS

## 2010-08-06 LAB — HEPATIC FUNCTION PANEL
AST: 98 U/L — ABNORMAL HIGH (ref 0–37)
Alkaline Phosphatase: 91 U/L (ref 39–117)
Bilirubin, Direct: 0.1 mg/dL (ref 0.0–0.3)
Total Bilirubin: 0.5 mg/dL (ref 0.3–1.2)

## 2010-08-06 LAB — HEMOCCULT GUIAC POC 1CARD (OFFICE): Fecal Occult Bld: NEGATIVE

## 2010-08-06 LAB — RETICULOCYTES: Retic Ct Pct: 0.6 % (ref 0.4–3.1)

## 2010-08-06 LAB — FOLATE: Folate: >20 ng/mL

## 2010-08-06 LAB — IRON AND TIBC: UIBC: 291 ug/dL

## 2010-08-06 LAB — APTT: aPTT: 112 seconds — ABNORMAL HIGH (ref 24–37)

## 2010-08-06 LAB — CK TOTAL AND CKMB (NOT AT ARMC)
CK, MB: 0.5 ng/mL (ref 0.3–4.0)
Total CK: 88 U/L (ref 7–177)

## 2010-08-06 LAB — FERRITIN: Ferritin: 339 ng/mL — ABNORMAL HIGH (ref 10–291)

## 2010-08-06 LAB — HEMOGLOBIN A1C: Mean Plasma Glucose: 163 mg/dL

## 2010-08-06 LAB — DIGOXIN LEVEL: Digoxin Level: 1 ng/mL (ref 0.8–2.0)

## 2010-08-06 LAB — ABO/RH: ABO/RH(D): AB POS

## 2010-09-08 NOTE — Group Therapy Note (Signed)
NAMESALAH, TINES                ACCOUNT NO.:  [id number]   MEDICAL RECORD NO.:  [id number]          PATIENT TYPE:  INP   LOCATION:  2908                         FACILITY:  [facility]   PHYSICIAN:  Hind I Elsaid, MD      DATE OF BIRTH:  24-Jan-1946                                 PROGRESS NOTE   CURRENT DISCHARGE DIAGNOSES:  1. Acute respiratory failure status post extubation.  2. Flash pulmonary edema.  3. Pneumonia.  4. Dilated cardiomyopathy.  5. Acute systolic congestive heart failure with an ejection fraction      25%.  6. Uncontrolled hypertension.  7. Diabetes mellitus.  8. 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  9. ST segment elevation myocardial infarction of the inferolateral      during cath.  10.Left anterior descending occlusion.  11.Hypothyroidism.  12.Renal insufficiency.  13.Anemia of iron and chronic disease, hemoglobin stable.  14.Ischemic dilated cardiomyopathy.   MEDICATIONS:  To be dictated at the date of actual discharge.   PROCEDURE:  1. Chest x-ray status post intubation.  2. Ultrasound of the kidney, normal renal ultrasound.  3. Chest x-ray, some improvement in air space.  4. Improved aeration.  5. Chest x-ray, interval improvement in cardiac enlargement and      pulmonary vascular congestion.  6. A 2D echo, left ventricular systolic function is moderately to      largely decreased.  EF 30%.  Left ventricular wall thickness was      above limit of normal, moderate to severe tricuspid regurg.  7. Cardiac cath, official report pending.   HISTORY OF PRESENT ILLNESS:  This is a 65 year old African American with history of hypertension,  diabetes, hyperlipidemia, followed by Dr. Glade Lloyd for 6 months for  congestive heart failure.  Patient was under the critical care service  from July 09, 2008, until July 12, 2008, when service switched to  transfer to [facility].  Patient admitted with shortness of breath and  was intubated and extubated  secondary to CHF.  Patient was then  transferred to telemetry.  1. Acute systolic congestive heart failure.  Patient found to have      ejection fraction 25% to 30%.  Patient was started on nitroglycerin      drip and Lasix with close monitoring of her urine output.  Also,      patient has spiked temperature and patient was rated as acute      respiratory failure secondary to flash pulmonary edema and      pneumonia.  Patient was placed on broad-spectrum antibiotics.      Blood cultures were negative and patient has 1 bottle of      staphylococcus species which was coagulase negative.  Urine culture      with enterococcus species with greater than 2000.  Her CHF improved      and cardiology being follow up with the patient and their      recommendation was to proceed with cath when the patient is stable.      Patient underwent cath and I do not have the official report today  but apparently there is large thrombus in the proximal left      circumflex and condition complicated by STEMI of the inferolateral      status post IV heparin and nitroglycerin infusion. Plan of this      patient to be addressed by cardiology for possible transesophageal      echo.  2. Hypertension which seems uncontrolled.  Patient's blood pressure      being monitored by the medication and patient had to resume Imdur      and digoxin.  3. Anemia.  Patient had workup done on the bus in 2007 which showed      normal EGD and colonoscopy with hemorrhoid.  H and H remained      stable during hospital stay.  4. Renal insufficiency.   DISPOSITION:  Patient needs further hospital stay until stabilization and cardiology  input will be appreciated.      Hind Franco Collet, MD  Electronically Signed     HIE/MEDQ  D:  07/16/2008  T:  07/16/2008  Job:  [id number]

## 2010-09-08 NOTE — Discharge Summary (Signed)
Grace Bradford, Grace Bradford                ACCOUNT NO.:  [id number]   MEDICAL RECORD NO.:  [id number]          PATIENT TYPE:  INP   LOCATION:  2908                         FACILITY:  [facility]   PHYSICIAN:  Noel Christmas, MD    DATE OF BIRTH:  08-07-1945   DATE OF ADMISSION:  07/09/2008  DATE OF DISCHARGE:  07/17/2008                               DISCHARGE SUMMARY   REASON FOR TRANSFER:  Family requests for the patient to be transferred  to [facility].  Please refer to the aforementioned to progress notes versus  discharge summary dictated yesterday by Dr. Donia Ast for final  discharge diagnoses, procedures, history of present illness.  In the  interim, the patient underwent a left heart catheterization yesterday  and the report is well-documented in Dr. Irven Shelling procedure note.  During  the left heart catheterization, an attempt at percutaneous angioplasty  of the left circumflex and the LAD were made.  Attempts at angioplasty  were mostly unsuccessful, so no stents were placed.  Subsequently the  patient's enzymes have been on the high side with a troponin being 30.6  this morning and increasing to 65.4 this afternoon.  The patient has  remained mostly stable during all this while.  Her vitals show a  temperature of 98.5, pulse 85, respirations 23, blood pressure 132/70  and saturating 98% on room air.  No chest pain and no shortness of  breath noted today.  As noted above, the family have requested the  patient be transferred to [facility] and this is because they have a  doctor over there they know.  A call was put through to the cardiologist  over at [facility], who has graciously accepted this patient.  The  call was placed by the cardiologist from here and we are getting  everything ready for this patient to be transferred over there.  In the  meantime, this patient is on the following medications over here.   1. Norvasc 10 mg daily.  2. Aspirin 325 mg daily.  3. Coreg 3.125 mg  b.i.d.  4. Plavix 75 mg daily.  5. Digoxin 0.125 mg daily.  6. Lovenox 100 mg subcu q.12 h.  7. Lasix 80 mg p.o. daily.  8. Bidil one tablet p.o. daily.  9. Hydrochlorothiazide 25 mg daily.  10.Insulin Lantus 10 units subcu q.h.s.  11.Levothyroxine 125 mcg p.o. daily.  12.Avelox 400 mg p.o. daily.  13.Protonix 40 mg p.o. daily.  14.Potassium chloride 40 mg p.o. daily.  15.Patient is also on Integrilin drip   Apart from this, patient is on p.r.n. medications and these include  morphine, Percocet, IV Lopressor, Tylenol.   Time used for transfer planning:  Greater than 30-minutes.      Noel Christmas, MD  Electronically Signed     GU/MEDQ  D:  07/17/2008  T:  07/17/2008  Job:  [id number]   cc:   [facility]

## 2010-09-08 NOTE — Cardiovascular Report (Signed)
Grace Bradford                ACCOUNT NO.:  [id number]   MEDICAL RECORD NO.:  [id number]          PATIENT TYPE:  INP   LOCATION:  2908                         FACILITY:  [facility]   PHYSICIAN:  Eden Lathe. Einar Gip, MD       DATE OF BIRTH:  15-Sep-1945   DATE OF PROCEDURE:  07/16/2008  DATE OF DISCHARGE:                            CARDIAC CATHETERIZATION   PROCEDURES PERFORMED:  1. Attempted percutaneous transluminal coronary angioplasty of the      circumflex coronary artery.  2. Attempted percutaneous transluminal coronary angioplasty at the      distal left anterior descending.   INDICATION:  [name] is a 3-year female who was recently  admitted to the [facility] with acute on chronic systolic and  diastolic heart failure who was found to have severe cardiomyopathy with  ejection fraction of 20%.  She also had pneumonia and hypertension.  Given the new diagnosis of cardiomyopathy, she also had positive cardiac  markers.  She was referred for cardiac catheterization.  She underwent  cardiac catheterization by Dr. Quincy Carnes, and please see the  dictation of the same for diagnostic catheterization.  Because of acute  thrombotic occlusion of the circumflex coronary artery and the distal  LAD, I was called in to see if I can intervene on the same.  Distal  embolization from left ventriculography was suspected given her  cardiomyopathy.   INTERVENTION DATA:  Failed attempt at crossing the 100% occluded small-  to-moderate size circumflex coronary artery.  In spite of multiple wires  that were utilized that includes [place], PT-2 moderate support,  Fielder XT, [employer] 3.0, I was unable to cross the lesion.  I  also used a balloon support, which was a 2.0 x 10-mm sprinter.  In spite  of this, I was unable to cross the stenosis and a stenosis into the  circumflex coronary artery.   Unsuccessful attempt at crossing the 100% occluded LAD.  Again balloon  support  was used.  This vessel especially being straight.  In spite of  being a straight vessel, I was unable to cross the lesion.   I suspect the embolic complications that occurred is form a very  organized thrombus.   I do not think that she is a patient for coronary artery bypass  grafting.  The vessel itself is small-to-moderate size at most and the  distal LAD is very small.  She has nonischemic cardiomyopathy.  Also  given the fact that she has embolic complications.  Perioperatively  during or after coronary artery bypass grafting, she would have a high  risk for embolic complication.  Overall, the risks out way the benefits  of coronary artery bypass grafting and revascularization.  Hence, she  would be considered for medical therapy only.  The situation has been  apprised to the patient's family.   A 400 mL of contrast was utilized for diagnostic and interventional  procedure.   TECHNIQUE OF THE PROCEDURE:  I exchanged a 5-French sheath to a 7-French  sheath.  Initially, I used a 7-French XB3.5 guide and  then I used a Q-  curve to engage the left main coronary artery.  Using multiple guide  wires, I was able to finally cross the occluded circumflex coronary  artery.  Because I was not able to advance it freely, I used a 2.0 x 10  mm sprinter balloon to give me support.  In spite of this, I was unable  to cross the circumflex coronary artery stenosis.  Given this, I  abandoned the procedure and I concentrate of the LAD.  Similar situation  was met.  In spite of the LAD being a straight vessel, I was unable to  cross the lesion in spite of balloon support.  Hence, I abandoned the  procedure.   There was a small amount of thrombus with a 30% stenosis noted in the  ramus intermediate branch.  However, this appeared to be stable and I  left this alone without performing any thrombectomy as I felt that we  can probably restudy her back if she remains stable.  Unless there is   significant EKG changes or enzymes continued to bump, I would recommend  medical therapy only with heparin, Integrilin, and possibly consider  giving her Coumadin.  We may also consider doing a TEE event when she is  stable to evaluate her LV mural thrombus.   Overall, I withdraw the guidewire and the guide catheter out of the  body.  The patient tolerated the procedure well.  No immediate  complication noted.   The patient did have chest pain 5-6/10 during the procedure.  However,  at the end of the procedure, her chest pain was significantly better and  she complained of 3/10 chest pain and she was feeling much more  comfortable.      Eden Lathe. Einar Gip, MD  Electronically Signed     JRG/MEDQ  D:  07/16/2008  T:  07/17/2008  Job:  [id number]

## 2010-09-08 NOTE — Cardiovascular Report (Signed)
NAMEANAYA, Grace Bradford                ACCOUNT NO.:  [id number]   MEDICAL RECORD NO.:  [id number]           PATIENT TYPE:   LOCATION:                                 FACILITY:   PHYSICIAN:  Quincy Carnes, MD      DATE OF BIRTH:  1945-08-24   DATE OF PROCEDURE:  07/16/2008  DATE OF DISCHARGE:                            CARDIAC CATHETERIZATION   INDICATIONS:  Grace Bradford is a 65 year old lady who presented to [facility] with evidence of pulmonary edema.  Further workup revealed  markedly decreased left ventricular function with an estimated ejection  fraction of 40%.  Grace Bradford was referred for cardiac catheterization to  evaluate for evidence of ischemic cardiomyopathy.  Furthermore, Ms.  Bradford had an abnormal cardiac stress test.   PROCEDURES:  1. Left heart catheterization.  2. Selective coronary angiography.  3. Left ventriculography.  4. Abdominal aortography.   DESCRIPTION OF PROCEDURE:  After informed consent was obtained, the  patient was brought to the [facility] at cath lab in  postabsorptive state.  Local anesthesia to the right groin was obtained  using 1% Xylocaine.  Subsequently 5-French arterial sheath was  introduced in the right femoral artery using the modified Seldinger  technique.  There were no complications.  A 5-inche #4 Judkins catheter  was used for selective coronary angiography of the native coronaries.  A  pigtail catheter was used for left ventriculography as well as abdominal  aortography.   FINDINGS:  Left main artery.  The left main artery is a medium size  caliber, short vessel that trifurcates into the LAD, ramus intermedius,  and left circumflex artery.  The left main artery is free of disease.   Left anterior descending coronary artery.  The LAD is a large triple  vessel wrapping around the apex of the heart.  It gives off 3 diagonal  branches and has mild lesion in the mid segment of the LAD.  There is a  mild, 40% lesion in  the mid segment of the LAD.   Ramus intermedius.  The ramus intermedius is a large vessel that was  free of disease.   Left circumflex artery.  The left circumflex artery is a small vessel  without angiographically significant disease.   Right coronary artery.  The right coronary artery is a large dominant  vessel that gives off the PDA.  The RCA has no angiographically  significant disease.   LV gram.  The left ventricle appears to be dilated with global  hypokinesis.  The ejection fraction is estimated at 20%.   HEMODYNAMICS:  Left ventricular pressure 172/18 and aortic pressure  176/92.   After completion of the left ventriculogram, ST elevation were noted on  telemetry.  This stay, the patient complained of new onset of chest  pain.  Subsequently, the left and right coronary artery system were  visualized again.  The left circumflex artery was found to be 100%  occluded in the proximal segment and the LAD was 100% occluded in the  distal segment.   Reviewing the procedure, the patient  likely presented with an left  ventricular thrombus that was disturbed during the left ventriculogram  and subsequently embolized to the left circumflex artery as well as the  LAD.  There was no evidence of dissection.  The patient was subsequently  started on heparin, nitroglycerin as well as integument and Dr. Einar Gip  was called for percutaneous coronary intervention and thrombectomy.   The patient remained hemodynamically stable at the end of the diagnostic  part of the procedure.   The procedure was performed by Dr. Quincy Carnes.  Dr. Ida Rogue  Proctored the case.      Quincy Carnes, MD  Electronically Signed     JE/MEDQ  D:  07/16/2008  T:  07/17/2008  Job:  [id number]

## 2010-09-11 NOTE — Op Note (Signed)
NAME:  Grace Bradford, Grace Bradford                          ACCOUNT NO.:  [id number]   MEDICAL RECORD NO.:  [id number]                   PATIENT TYPE:  AMB   LOCATION:  DAY                                  [facility]ACILITY[facility]  [facility]   PHYSICIAN:  Tarri Glenn, M.D.               DATE OF BIRTH:  04-03-1946   DATE OF PROCEDURE:  12/03/2003  DATE OF DISCHARGE:                                 OPERATIVE REPORT   PREOPERATIVE DIAGNOSES:  1. Torn medial meniscus.  2. Osteoarthritis, left knee.   POSTOPERATIVE DIAGNOSES:  1. Torn medial and lateral menisci.  2. Osteoarthritis, left knee.   OPERATION:  Left knee arthroscopy with (1) Partial medial and lateral  meniscectomy.  (2) Debridement of lateral femoral condyle.   SURGEON:  Laurice Record. Aplington, M.D.   ASSISTANT:  Nurse.   ANESTHESIA:  General.   PATHOLOGY AND JUSTIFICATION FOR PROCEDURE:  Pain, swelling, of left knee  with MRI demonstrating patellofemoral arthritis and a mid body tear of the  medial meniscus.  At surgery, the patella was worn.  There was not a lot to  correct arthroscopically in this joint.  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   DESCRIPTION OF PROCEDURE:  Satisfactory general anesthesia, pneumatic  tourniquet with leg Esmarched out nonsterilely.  She was a very heavy woman,  and we started out at 400 mmHg, thigh stabilizer.  Left leg was prepped with  DuraPrep and draped in a sterile field.  The right knee was supported  beneath and an Ace wrap was used to support the leg.  Superior and medial  saline inflow.  First, through an anterolateral portal, the medial  compartment of the knee joint was evaluated.  We had immediate problems with  substantial bleeding in the joint.   Also, the arthroscope had a scrape on  it.  We autoclaved a new scope but opening it, found that it was not  thoroughly sterilized and unfortunately, there was some contamination of the  leg, so we started on some prophylactic antibiotics at this time, and we  will keep her on it for 24 hours.  We continued with the original  arthroscope.  Despite adrenalin in the bags, bleeding continued, and I  finally released the tourniquet, and this allowed me to finish the case with  a relatively bloodless knee joint.  The findings noted above were observed  during the case.  Through an anterolateral portal, I was able to shave down  the anterior third of the medial meniscus and reaming with the 3.5 shaver,  shaved down the tear of the mid and curved area of the meniscus down  to a  stable rim.  Pre and postfilms were taken.  Looking up in the medial gutters  and suprapatellar area, I did some minimal debridement of the suprapubic  fossa, but there really was not a lot to fix arthroscopically.  On reversing  portals, shaved the lateral meniscus anteriorly, and the defect and the  lateral femoral condyle.  The knee joint was then irrigated until clear and  all fluid possible removed.  The two anterior portals were closed with 4-0  nylon.  Marcaine 0.5% 20 mL with adrenalin and 4 mg of  morphine were then instilled through the inflow apparatus which was removed  and this portal closed with 4-0 nylon as well.  Betadine, Adaptic dry  sterile dressing were applied.  Tourniquet was already released.  She was  taken to the recovery room in satisfactory condition with no known  complications.                                               Tarri Glenn, M.D.    JA/MEDQ  D:  12/03/2003  T:  12/03/2003  Job:  [id number]

## 2010-09-11 NOTE — Op Note (Signed)
   Grace Bradford, Grace Bradford                            ACCOUNT NO.:  [id number]   MEDICAL RECORD NO.:  [id number]                   PATIENT TYPE:  AMB   LOCATION:  ENDO                                 FACILITY:  [facility]   PHYSICIAN:  Waverly Ferrari, M.D.                 DATE OF BIRTH:  [birth year]   DATE OF PROCEDURE:  02/21/2002  DATE OF DISCHARGE:                                 OPERATIVE REPORT   PROCEDURE:  Upper endoscopy with biopsy.   ENDOSCOPIST:  Waverly Ferrari, M.D.   INDICATIONS:  GERD.   ANESTHESIA:  Demerol 50 mg, Versed 5 mg.   DESCRIPTION OF PROCEDURE:  With the patient mildly sedated in the left  lateral decubitus position, the Olympus videoscopic endoscope was inserted  into the mouth, passed under direct vision through the esophagus, which I  think appeared normal.  A hiatal hernia was seen and photographed, and the  most distal portion of the esophagus was sampled for Barretts.  We entered  into the stomach.  The fundus appeared normal.  The body and antrum showed  scattered areas of erythema that were photographed, and biopsy samples were  taken.  The duodenal bulb showed mild degree of duodenitis.  The second  portion of the duodenum was normal.  From this point, the endoscope was  slowly withdrawn taking circumferential views of entire duodenal mucosa  until the endoscope was pulled back into the stomach, placed in retroflexion  to view the stomach from below, and this showed the hiatal hernia.  The  endoscope was then straightened and withdrawn taking circumferential views  of the remaining gastric and esophageal mucosa.  The patient's  vital signs  and pulse oximeter remained stable.  The patient tolerated the procedure  well without apparent complications.   FINDINGS:  1. Erythema of body and antrum.  2. Mild duodenitis.  3. Hiatal hernia.  4. Distal esophageal biopsies taken.   PLAN:  Await biopsy report.  The patient will call me for results and follow  up  with me as an outpatient.                                               Waverly Ferrari, M.D.    GMO/MEDQ  D:  02/21/2002  T:  02/21/2002  Job:  [id number]

## 2010-09-11 NOTE — Op Note (Signed)
NAMEMONSSERRAT, KINZLER                ACCOUNT NO.:  [id number]   MEDICAL RECORD NO.:  [id number]          PATIENT TYPE:  AMB   LOCATION:  ENDO                         FACILITY:  [facility]   PHYSICIAN:  Waverly Ferrari, M.D.    DATE OF BIRTH:  Feb 18, 1946   DATE OF PROCEDURE:  10/14/2005  DATE OF DISCHARGE:                                 OPERATIVE REPORT   PROCEDURE:  Upper endoscopy with biopsy.   INDICATIONS:  Hemoccult positivity.   ANESTHESIA:  Demerol 60, Versed 6 mg.   DESCRIPTION OF PROCEDURE:  With the patient mildly sedated in the left  lateral decubitus position, the Olympus videoscopic endoscope was inserted  in the mouth and passed under direct vision through the esophagus which  appeared normal; into the stomach, fundus, body, antrum, duodenal bulb,  second portion of duodenum were visualized.  From this point the endoscope  was slowly withdrawn taking circumferential views of  the duodenal mucosa  until the endoscope had been pulled back, into the stomach, placed in  retroflexion to view the stomach from below.  The endoscope was then  straightened and withdrawn taking circumferential views of the remaining  gastric and esophageal mucosa; stopping in the antrum where linear erosions  were noted and photographed and biopsied, as were polyps that were seen in  the fundus of the stomach.  The patient's vital signs and pulse oximeter  remained stable.  The patient tolerated the procedure well without apparent  complications.   FINDINGS:  Fundic gland polyps, biopsied.  Antral layer erosions biopsied.  Await biopsy reports.  The patient will call me for results and follow up  with me as an outpatient.           ______________________________  Waverly Ferrari, M.D.     GMO/MEDQ  D:  10/14/2005  T:  10/14/2005  Job:  [id number]

## 2010-09-11 NOTE — Op Note (Signed)
[name], Grace Bradford                            ACCOUNT NO.:  [id number]   MEDICAL RECORD NO.:  [id number]                   PATIENT TYPE:  OIB   LOCATION:  2888                                 FACILITY:  [facility]   PHYSICIAN:  Anderson Malta, M.D.                 DATE OF BIRTH:  January 24, 1946   DATE OF PROCEDURE:  11/27/2002  DATE OF DISCHARGE:                                 OPERATIVE REPORT   PREOPERATIVE DIAGNOSIS:  Right Achilles tendon avulsion.   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   OPERATION PERFORMED:  Right Achilles tendon rupture repair with debridement  of tendinosis.   SURGEON:  Anderson Malta, M.D.   ANESTHESIA:  General endotracheal.   ESTIMATED BLOOD LOSS:  79mL.   DRAINS:  None.   TOURNIQUET TIME:  Ankle Esmarch utilized for one hour.   DESCRIPTION OF PROCEDURE:  The patient was brought to the operating room  where general endotracheal anesthesia was induced.  Preoperative antibiotics  were administered.  The patient was placed prone on the operating table with  the contralateral knee and extremities well padded.  The right leg was then  prepped with DuraPrep solution and draped in sterile manner.  Charlie Pitter was used  to cover the operative field.  Ankle Esmarch was utilized.  Incision was  made beginning at the medial aspect of the Achilles tendon approximately 10  cm above the tip of the calcaneus.  Incision was carried down along the  central medial aspect of the Achilles tendon.  A palpable defect was noted  distally.  The skin was incised.  The peritenon was also incised and then  was inflamed over the tendon.  The inflamed portion of the peritenon was  excised.  95% thickness tear was noted about 2 cm distal to the calcaneal  attachment of the Achilles tendon.  The Achilles tendon itself was very  woody-feeling. It was very rounded and not flattened as a normal tendon  would be. The diseased tendon was  debrided.  Multiple longitudinal punctures  with a 15 blade to promote healing were made.  Bony osteophytes were removed  at the distal attachment of the Achilles tendon.  Using a __________  two  drill holes were placed through the calcaneal tuberosity.  #2 FiberWire was  then placed through the drill holes.  Two #2 FiberWires were placed in  modified [place] fashion in the proximal tendon.  The edges were then  reapproximated and tied.  Four strands of FiberWire across the site across  the rupture site.  There was good reapproximation of collagenous tissue.  The ends were also debrided prior to reapproximation.  At this time the  tourniquet was released.  Bleeding points encountered were controlled with  electrocautery.  The skin was then carefully reapproximated using  interrupted inverted 3-0 Vicryl suture approximating the peritenon where  possible.  Approximating that part of the peritenon that was not excised  when possible.  The well-padded posterior splint was then applied.  The  patient tolerated the procedure well without immediate complications.                                               Anderson Malta, M.D.   GSD/MEDQ  D:  11/27/2002  T:  11/28/2002  Job:  [id number]

## 2010-09-11 NOTE — Op Note (Signed)
Grace Bradford, Grace Bradford                ACCOUNT NO.:  [id number]   MEDICAL RECORD NO.:  [id number]          PATIENT TYPE:  AMB   LOCATION:  ENDO                         FACILITY:  [facility]   PHYSICIAN:  Waverly Ferrari, M.D.    DATE OF BIRTH:  [birth year]   DATE OF PROCEDURE:  10/14/2005  DATE OF DISCHARGE:                                 OPERATIVE REPORT   PROCEDURE:  Colonoscopy.   INDICATIONS:  Hemoccult positivity.   ANESTHESIA:  Demerol 40, Versed 2 mg.   PROCEDURE:  With the patient mildly sedated in the left lateral decubitus  position, the Olympus videoscopic colonoscope was inserted in the rectum and  passed under direct vision with pressure applied and patient rolled to her  back.  We were able to reach the cecum, identified by ileocecal valve and  appendiceal orifice, both of which were photographed.  From this point, the  colonoscope was slowly withdrawn, taking circumferential views of colonic  mucosa, stopping to photograph diverticula seen along the way until we  reached the rectum which appeared normal on direct and showed hemorrhoids on  retroflexed view.  The endoscope was straightened and withdrawn.  The  patient's vital signs and pulse oximeter remained stable.  The patient  tolerated the procedure well without apparent complications.   FINDINGS:  Diverticulosis of the colon, right side mild, left-sided  moderate; internal hemorrhoids.  Otherwise an unremarkable exam.   PLAN:  See endoscopy note for further details.           ______________________________  Waverly Ferrari, M.D.     GMO/MEDQ  D:  10/14/2005  T:  10/14/2005  Job:  [id number]

## 2010-09-24 ENCOUNTER — Encounter: Payer: Self-pay | Admitting: Endocrinology

## 2010-09-28 ENCOUNTER — Encounter: Payer: Self-pay | Admitting: Cardiology

## 2010-09-28 ENCOUNTER — Ambulatory Visit (INDEPENDENT_AMBULATORY_CARE_PROVIDER_SITE_OTHER): Admitting: Cardiology

## 2010-09-28 VITALS — BP 106/52 | HR 88 | Ht 62.5 in | Wt 256.4 lb

## 2010-09-28 DIAGNOSIS — I509 Heart failure, unspecified: Secondary | ICD-10-CM

## 2010-09-28 DIAGNOSIS — J449 Chronic obstructive pulmonary disease, unspecified: Secondary | ICD-10-CM

## 2010-09-28 DIAGNOSIS — I428 Other cardiomyopathies: Secondary | ICD-10-CM

## 2010-09-28 DIAGNOSIS — E119 Type 2 diabetes mellitus without complications: Secondary | ICD-10-CM

## 2010-09-28 DIAGNOSIS — I6350 Cerebral infarction due to unspecified occlusion or stenosis of unspecified cerebral artery: Secondary | ICD-10-CM

## 2010-09-28 DIAGNOSIS — I639 Cerebral infarction, unspecified: Secondary | ICD-10-CM

## 2010-09-28 DIAGNOSIS — E669 Obesity, unspecified: Secondary | ICD-10-CM

## 2010-09-28 DIAGNOSIS — I10 Essential (primary) hypertension: Secondary | ICD-10-CM

## 2010-09-29 ENCOUNTER — Encounter: Payer: Self-pay | Admitting: Cardiology

## 2010-09-29 DIAGNOSIS — E669 Obesity, unspecified: Secondary | ICD-10-CM | POA: Insufficient documentation

## 2010-09-29 DIAGNOSIS — I429 Cardiomyopathy, unspecified: Secondary | ICD-10-CM | POA: Insufficient documentation

## 2010-09-29 DIAGNOSIS — J984 Other disorders of lung: Secondary | ICD-10-CM | POA: Insufficient documentation

## 2010-09-29 DIAGNOSIS — I10 Essential (primary) hypertension: Secondary | ICD-10-CM | POA: Insufficient documentation

## 2010-09-29 DIAGNOSIS — E119 Type 2 diabetes mellitus without complications: Secondary | ICD-10-CM | POA: Insufficient documentation

## 2010-09-29 DIAGNOSIS — I639 Cerebral infarction, unspecified: Secondary | ICD-10-CM | POA: Insufficient documentation

## 2010-09-29 NOTE — Assessment & Plan Note (Signed)
She has both a nonischemic cardiomyopathy that began initially and an ischemic cardiomyopathy in the result of complications of catheterization in March of 2010. She had a stress Cardiolite study with inferior akinesis and ejection fraction of 32% with left ventricular enlargement in October of 2011. She's continued to have shortness of breath but her last BNP on March 14 was only 124. She does have a history of diabetes with poor control but she reports her blood sugars have improved. He is hard to totally separate her COPD from her cardiomyopathy. We'll be checking a 2-D echocardiogram and BMP and chemistries today. For now will not have any significant change in medications
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
## 2010-10-01 ENCOUNTER — Other Ambulatory Visit: Payer: Self-pay | Admitting: *Deleted

## 2010-10-01 DIAGNOSIS — R079 Chest pain, unspecified: Secondary | ICD-10-CM

## 2010-10-01 DIAGNOSIS — R0602 Shortness of breath: Secondary | ICD-10-CM

## 2010-10-02 ENCOUNTER — Other Ambulatory Visit (INDEPENDENT_AMBULATORY_CARE_PROVIDER_SITE_OTHER): Payer: Federal, State, Local not specified - PPO | Admitting: *Deleted

## 2010-10-02 DIAGNOSIS — R0602 Shortness of breath: Secondary | ICD-10-CM

## 2010-10-02 LAB — BRAIN NATRIURETIC PEPTIDE: Pro B Natriuretic peptide (BNP): 526 pg/mL — ABNORMAL HIGH (ref 0.0–100.0)

## 2010-10-06 ENCOUNTER — Telehealth: Payer: Self-pay | Admitting: *Deleted

## 2010-10-06 DIAGNOSIS — I509 Heart failure, unspecified: Secondary | ICD-10-CM

## 2010-10-06 NOTE — Telephone Encounter (Signed)
Called 3x msg left at home number to increase lasix, pt to take lasix 80mg  am and 40 mg mid afternoon, reminded to keep app w lori for the 19 and will draw labs.Corwin Levins RN

## 2010-10-08 ENCOUNTER — Ambulatory Visit (HOSPITAL_COMMUNITY): Payer: Federal, State, Local not specified - PPO | Attending: Cardiology | Admitting: Radiology

## 2010-10-08 DIAGNOSIS — I501 Left ventricular failure: Secondary | ICD-10-CM | POA: Insufficient documentation

## 2010-10-08 DIAGNOSIS — I10 Essential (primary) hypertension: Secondary | ICD-10-CM | POA: Insufficient documentation

## 2010-10-08 DIAGNOSIS — E119 Type 2 diabetes mellitus without complications: Secondary | ICD-10-CM | POA: Insufficient documentation

## 2010-10-08 DIAGNOSIS — I379 Nonrheumatic pulmonary valve disorder, unspecified: Secondary | ICD-10-CM | POA: Insufficient documentation

## 2010-10-08 DIAGNOSIS — I428 Other cardiomyopathies: Secondary | ICD-10-CM

## 2010-10-08 DIAGNOSIS — I079 Rheumatic tricuspid valve disease, unspecified: Secondary | ICD-10-CM | POA: Insufficient documentation

## 2010-10-08 DIAGNOSIS — I509 Heart failure, unspecified: Secondary | ICD-10-CM

## 2010-10-09 ENCOUNTER — Telehealth: Payer: Self-pay | Admitting: *Deleted

## 2010-10-09 NOTE — Telephone Encounter (Signed)
Pt notified to increase Lasix to 80 mg in am and 40 mg in pm.  Pt will f/u with Cecille Rubin on 10/19/10.

## 2010-10-12 ENCOUNTER — Encounter: Payer: Self-pay | Admitting: *Deleted

## 2010-10-12 DIAGNOSIS — N189 Chronic kidney disease, unspecified: Secondary | ICD-10-CM | POA: Insufficient documentation

## 2010-10-12 DIAGNOSIS — G629 Polyneuropathy, unspecified: Secondary | ICD-10-CM | POA: Insufficient documentation

## 2010-10-12 DIAGNOSIS — M2550 Pain in unspecified joint: Secondary | ICD-10-CM | POA: Insufficient documentation

## 2010-10-12 DIAGNOSIS — I509 Heart failure, unspecified: Secondary | ICD-10-CM | POA: Insufficient documentation

## 2010-10-12 DIAGNOSIS — M549 Dorsalgia, unspecified: Secondary | ICD-10-CM | POA: Insufficient documentation

## 2010-10-12 DIAGNOSIS — I428 Other cardiomyopathies: Secondary | ICD-10-CM | POA: Insufficient documentation

## 2010-10-12 DIAGNOSIS — Z8673 Personal history of transient ischemic attack (TIA), and cerebral infarction without residual deficits: Secondary | ICD-10-CM | POA: Insufficient documentation

## 2010-10-12 DIAGNOSIS — I219 Acute myocardial infarction, unspecified: Secondary | ICD-10-CM | POA: Insufficient documentation

## 2010-10-12 DIAGNOSIS — K219 Gastro-esophageal reflux disease without esophagitis: Secondary | ICD-10-CM | POA: Insufficient documentation

## 2010-10-13 ENCOUNTER — Telehealth: Payer: Self-pay | Admitting: *Deleted

## 2010-10-13 ENCOUNTER — Ambulatory Visit: Admitting: Nurse Practitioner

## 2010-10-13 ENCOUNTER — Other Ambulatory Visit: Payer: Federal, State, Local not specified - PPO | Admitting: *Deleted

## 2010-10-13 NOTE — Telephone Encounter (Signed)
Pt notified of echo results and informed of appointment to discuss defibrillator with Dr. Rayann Heman on 11/16/10 at 0915.  Pt will see Truitt Merle NP on 10/19/10.

## 2010-10-19 ENCOUNTER — Other Ambulatory Visit (INDEPENDENT_AMBULATORY_CARE_PROVIDER_SITE_OTHER): Payer: Federal, State, Local not specified - PPO | Admitting: *Deleted

## 2010-10-19 ENCOUNTER — Encounter: Payer: Self-pay | Admitting: Nurse Practitioner

## 2010-10-19 ENCOUNTER — Ambulatory Visit (INDEPENDENT_AMBULATORY_CARE_PROVIDER_SITE_OTHER): Payer: Federal, State, Local not specified - PPO | Admitting: Nurse Practitioner

## 2010-10-19 VITALS — BP 108/80 | HR 84 | Ht 62.5 in | Wt 253.0 lb

## 2010-10-19 DIAGNOSIS — I509 Heart failure, unspecified: Secondary | ICD-10-CM

## 2010-10-19 LAB — BASIC METABOLIC PANEL
BUN: 58 mg/dL — ABNORMAL HIGH (ref 6–23)
CO2: 29 mEq/L (ref 19–32)
Calcium: 8.1 mg/dL — ABNORMAL LOW (ref 8.4–10.5)
Chloride: 100 mEq/L (ref 96–112)
Creatinine, Ser: 2.3 mg/dL — ABNORMAL HIGH (ref 0.4–1.2)
GFR: 27.06 mL/min — ABNORMAL LOW (ref >60.00)
Glucose, Bld: 288 mg/dL — ABNORMAL HIGH (ref 70–99)
Potassium: 4.4 mEq/L (ref 3.5–5.1)
Sodium: 138 mEq/L (ref 135–145)

## 2010-10-19 LAB — BRAIN NATRIURETIC PEPTIDE: Pro B Natriuretic peptide (BNP): 342 pg/mL — ABNORMAL HIGH (ref 0.0–100.0)

## 2010-10-19 NOTE — Patient Instructions (Signed)
Continue with your current medicines. Weigh yourself each morning and record. Take extra dose of diuretic for weight gain of 3 pounds in 24 hours. Limit sodium intake.  See Dr. Rayann Heman on July 23rd for consideration of a defibrillator See Dr. Tempie Hoist on August 1st
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
## 2010-10-19 NOTE — Assessment & Plan Note (Signed)
She will be seeing Dr. Rayann Heman for ICD consideration in July. She is to see Dr. Haroldine Laws in August. I have left her on her current regimen. I don't think her blood pressure will tolerate titration of her medicines at this time. We will see what her labs looks like. BMET and BNP are checked today. She does see Dr. Florene Glen for her kidneys. I suspect her progressive renal failure is resulting in worsening heart failure. She is not on low dose Digoxin but I would avoid due to her kidneys. Her options appear limited and her prognosis does not look good to me. May need to consider Palliative care consult.

## 2010-10-21 ENCOUNTER — Telehealth: Payer: Self-pay | Admitting: *Deleted

## 2010-10-21 NOTE — Telephone Encounter (Signed)
Message copied by Steva Ready on Wed Oct 21, 2010  8:42 AM ------      Message from: Burtis Junes      Created: Wed Oct 21, 2010  7:42 AM       Same medicines for now. I have sent a note to Dr. Haroldine Laws. They will try to see her sooner.

## 2010-10-21 NOTE — Telephone Encounter (Signed)
Pt notified of lab results and will continue same medications.  RN informed pt we are trying to get her in sooner with Dr. Haroldine Laws.

## 2010-11-16 ENCOUNTER — Ambulatory Visit: Payer: Federal, State, Local not specified - PPO | Admitting: Internal Medicine

## 2010-11-19 ENCOUNTER — Encounter: Payer: Self-pay | Admitting: Internal Medicine

## 2010-11-25 ENCOUNTER — Institutional Professional Consult (permissible substitution): Admitting: Internal Medicine

## 2010-11-26 ENCOUNTER — Encounter: Payer: Self-pay | Admitting: Internal Medicine

## 2010-11-26 ENCOUNTER — Ambulatory Visit (INDEPENDENT_AMBULATORY_CARE_PROVIDER_SITE_OTHER): Payer: Federal, State, Local not specified - PPO | Admitting: Internal Medicine

## 2010-11-26 VITALS — BP 100/66 | HR 96 | Resp 14 | Ht 62.0 in | Wt 245.0 lb

## 2010-11-26 DIAGNOSIS — I509 Heart failure, unspecified: Secondary | ICD-10-CM

## 2010-11-26 DIAGNOSIS — I5022 Chronic systolic (congestive) heart failure: Secondary | ICD-10-CM | POA: Insufficient documentation

## 2010-11-26 MED ORDER — HYDRALAZINE HCL 25 MG PO TABS: 37.5000 mg | ORAL_TABLET | Freq: Three times a day (TID) | ORAL | Status: DC

## 2010-11-26 NOTE — Assessment & Plan Note (Signed)
I have reviewed her previous cath films and echos. I have also reviewed her records in depth. Difficult situation. She has NYHA III-IIIB symptoms with well controlled volume status. She is currently comfortable with her functional abilities though she feels if she got even a little bit worse this might not be the case. Unfortunately, given her renal insufficiency and body habitus not ideal candidate for transplant or mechanical support; however could consider inotropic therapy. We have made the decision to continue current therapy and if she gets any worse would proceed with RHC to further evaluate. We also discussed risks/indications of ICD and she is currently not interested in proceeding with this even though she realizes that she is at increased risk for SCD. We will follow her closely in HF clinic. Will continue to titrate hydralazine and nitrates and at some point we may consider careful re-introduction of ACE-I if OK with Dr. Florene Glen. Time spent 75 minutes.

## 2010-11-26 NOTE — Assessment & Plan Note (Deleted)
NYHA 3 continues to be short of breath on exertion. Weight decreased 7 pounds since last visit. She is tolerating medications .
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
## 2010-11-26 NOTE — Patient Instructions (Signed)
Increase Hyrdalazine to 1 & 1/2 tab three times a day  Your physician recommends that you schedule a follow-up appointment in: 1 month in [facility]

## 2010-12-04 ENCOUNTER — Encounter: Payer: Self-pay | Admitting: Internal Medicine

## 2010-12-24 ENCOUNTER — Ambulatory Visit (HOSPITAL_COMMUNITY)
Admission: RE | Admit: 2010-12-24 | Discharge: 2010-12-24 | Disposition: A | Discharge status: Home / Self Care | Payer: Federal, State, Local not specified - PPO | Source: Ambulatory Visit | Attending: Internal Medicine | Admitting: Internal Medicine

## 2010-12-24 DIAGNOSIS — I5022 Chronic systolic (congestive) heart failure: Secondary | ICD-10-CM | POA: Insufficient documentation

## 2010-12-24 NOTE — Assessment & Plan Note (Signed)
Difficult situation. She has NYHA III-IIIB symptoms with well controlled volume status. Discussed possible RHC to re evaluate hemodynamics.  Discussed possible inotropic therapy. We also discussed risks/indications of ICD and she is currently not interested in proceeding with this even though she realizes that she is at increased risk for SCD. After discussion she prefers to re-evaluate in two months.

## 2010-12-24 NOTE — Patient Instructions (Signed)
Your physician recommends that you schedule a follow-up appointment in: 1 month  

## 2010-12-29 ENCOUNTER — Ambulatory Visit (HOSPITAL_COMMUNITY): Payer: Federal, State, Local not specified - PPO

## 2011-01-14 ENCOUNTER — Emergency Department (HOSPITAL_COMMUNITY)
Admission: EM | Admit: 2011-01-14 | Discharge: 2011-01-14 | Disposition: A | Discharge status: Home / Self Care | Payer: Federal, State, Local not specified - PPO | Attending: Emergency Medicine | Admitting: Emergency Medicine

## 2011-01-14 DIAGNOSIS — Z7982 Long term (current) use of aspirin: Secondary | ICD-10-CM | POA: Insufficient documentation

## 2011-01-14 DIAGNOSIS — R040 Epistaxis: Secondary | ICD-10-CM | POA: Insufficient documentation

## 2011-01-14 DIAGNOSIS — E039 Hypothyroidism, unspecified: Secondary | ICD-10-CM | POA: Insufficient documentation

## 2011-01-14 DIAGNOSIS — I509 Heart failure, unspecified: Secondary | ICD-10-CM | POA: Insufficient documentation

## 2011-01-14 DIAGNOSIS — I10 Essential (primary) hypertension: Secondary | ICD-10-CM | POA: Insufficient documentation

## 2011-01-14 DIAGNOSIS — Z79899 Other long term (current) drug therapy: Secondary | ICD-10-CM | POA: Insufficient documentation

## 2011-01-14 DIAGNOSIS — E119 Type 2 diabetes mellitus without complications: Secondary | ICD-10-CM | POA: Insufficient documentation

## 2011-01-14 LAB — POCT I-STAT, CHEM 8
BUN: 65 mg/dL — ABNORMAL HIGH (ref 6–23)
Creatinine, Ser: 2.1 mg/dL — ABNORMAL HIGH (ref 0.50–1.10)
Potassium: 4.5 mEq/L (ref 3.5–5.1)
Sodium: 136 mEq/L (ref 135–145)
TCO2: 31 mmol/L (ref 0–100)

## 2011-01-27 ENCOUNTER — Encounter (HOSPITAL_COMMUNITY): Payer: Federal, State, Local not specified - PPO

## 2011-02-01 ENCOUNTER — Ambulatory Visit (HOSPITAL_COMMUNITY)
Admission: RE | Admit: 2011-02-01 | Discharge: 2011-02-01 | Disposition: A | Discharge status: Home / Self Care | Payer: Federal, State, Local not specified - PPO | Source: Ambulatory Visit | Attending: Internal Medicine | Admitting: Internal Medicine

## 2011-02-01 ENCOUNTER — Encounter (HOSPITAL_COMMUNITY): Payer: Self-pay

## 2011-02-01 VITALS — BP 112/76 | HR 97 | Wt 243.5 lb

## 2011-02-01 DIAGNOSIS — I5022 Chronic systolic (congestive) heart failure: Secondary | ICD-10-CM | POA: Insufficient documentation
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
## 2011-02-01 NOTE — Patient Instructions (Addendum)
Important Heart Failure Medications: Carvedilol: helps control heart rate & blood pressure, keeps your heart from getting overworked Isosorbide Dinitrate/Hydralazine: helps reduce blood pressure, reduces the amount of stress on your heart Spironolactone: helps keep fluid off, helps you feel better, protects your heart **Plus, all of these medicines help you live longer!**  Other important medications: Furosemide: removes extra fluid and helps you feel better Metolazone: boosts the effect of furosemide  Please continue to weigh daily  Follow up one month

## 2011-02-01 NOTE — Assessment & Plan Note (Addendum)
NYHA III-III with well controlled volume status. She did not tolerate increase in Hydralazine due to dizziness.  We discussed risks/indications of ICD with her daughter and husband at length. She realizes that she is at increased risk for SCD however she declines at this time. Follow up in 1 month.   Patient seen and examined with Darrick Grinder, NP. We discussed all aspects of the encounter. I agree with the assessment and plan as stated above.

## 2011-02-14 ENCOUNTER — Emergency Department (HOSPITAL_COMMUNITY)
Admission: EM | Admit: 2011-02-14 | Discharge: 2011-02-15 | Disposition: A | Discharge status: Home / Self Care | Payer: Federal, State, Local not specified - PPO | Attending: Emergency Medicine | Admitting: Emergency Medicine

## 2011-02-14 ENCOUNTER — Inpatient Hospital Stay (INDEPENDENT_AMBULATORY_CARE_PROVIDER_SITE_OTHER)
Admission: RE | Admit: 2011-02-14 | Discharge: 2011-02-14 | Disposition: A | Discharge status: Home / Self Care | Payer: Federal, State, Local not specified - PPO | Source: Ambulatory Visit | Attending: Family Medicine | Admitting: Family Medicine

## 2011-02-14 ENCOUNTER — Emergency Department (HOSPITAL_COMMUNITY): Payer: Federal, State, Local not specified - PPO

## 2011-02-14 DIAGNOSIS — R109 Unspecified abdominal pain: Secondary | ICD-10-CM

## 2011-02-14 DIAGNOSIS — I10 Essential (primary) hypertension: Secondary | ICD-10-CM | POA: Insufficient documentation

## 2011-02-14 DIAGNOSIS — E119 Type 2 diabetes mellitus without complications: Secondary | ICD-10-CM | POA: Insufficient documentation

## 2011-02-14 DIAGNOSIS — K5900 Constipation, unspecified: Secondary | ICD-10-CM | POA: Insufficient documentation

## 2011-02-14 DIAGNOSIS — Z79899 Other long term (current) drug therapy: Secondary | ICD-10-CM | POA: Insufficient documentation

## 2011-02-14 DIAGNOSIS — E039 Hypothyroidism, unspecified: Secondary | ICD-10-CM | POA: Insufficient documentation

## 2011-02-14 DIAGNOSIS — R110 Nausea: Secondary | ICD-10-CM | POA: Insufficient documentation

## 2011-02-14 DIAGNOSIS — I509 Heart failure, unspecified: Secondary | ICD-10-CM | POA: Insufficient documentation

## 2011-02-14 LAB — URINALYSIS, ROUTINE W REFLEX MICROSCOPIC
Hgb urine dipstick: NEGATIVE
Protein, ur: NEGATIVE mg/dL
Urobilinogen, UA: 0.2 mg/dL (ref 0.0–1.0)

## 2011-02-14 LAB — COMPREHENSIVE METABOLIC PANEL
ALT: 9 U/L (ref 0–35)
BUN: 50 mg/dL — ABNORMAL HIGH (ref 6–23)
CO2: 30 mEq/L (ref 19–32)
Calcium: 9.1 mg/dL (ref 8.4–10.5)
Creatinine, Ser: 1.85 mg/dL — ABNORMAL HIGH (ref 0.50–1.10)
GFR calc Af Amer: 32 mL/min — ABNORMAL LOW (ref >90)
GFR calc non Af Amer: 28 mL/min — ABNORMAL LOW (ref >90)
Glucose, Bld: 183 mg/dL — ABNORMAL HIGH (ref 70–99)
Sodium: 140 mEq/L (ref 135–145)

## 2011-02-14 LAB — CBC
HCT: 37.4 % (ref 36.0–46.0)
Hemoglobin: 12.7 g/dL (ref 12.0–15.0)
MCH: 28.2 pg (ref 26.0–34.0)
MCV: 83.1 fL (ref 78.0–100.0)
RBC: 4.5 MIL/uL (ref 3.87–5.11)

## 2011-02-14 LAB — LIPASE, BLOOD: Lipase: 111 U/L — ABNORMAL HIGH (ref 11–59)

## 2011-02-14 LAB — DIFFERENTIAL
Lymphocytes Relative: 29 % (ref 12–46)
Lymphs Abs: 2.4 10*3/uL (ref 0.7–4.0)
Monocytes Absolute: 0.9 10*3/uL (ref 0.1–1.0)
Monocytes Relative: 11 % (ref 3–12)
Neutro Abs: 4.7 10*3/uL (ref 1.7–7.7)
Neutrophils Relative %: 58 % (ref 43–77)

## 2011-02-16 LAB — URINE CULTURE

## 2011-03-01 ENCOUNTER — Encounter: Payer: Self-pay | Admitting: Internal Medicine

## 2011-03-04 ENCOUNTER — Encounter (HOSPITAL_COMMUNITY): Payer: Self-pay

## 2011-03-04 ENCOUNTER — Ambulatory Visit (HOSPITAL_COMMUNITY)
Admission: RE | Admit: 2011-03-04 | Discharge: 2011-03-04 | Disposition: A | Discharge status: Home / Self Care | Payer: Federal, State, Local not specified - PPO | Source: Ambulatory Visit | Attending: Internal Medicine | Admitting: Internal Medicine

## 2011-03-04 DIAGNOSIS — I5022 Chronic systolic (congestive) heart failure: Secondary | ICD-10-CM | POA: Insufficient documentation

## 2011-03-04 MED ORDER — TORSEMIDE 20 MG PO TABS: ORAL_TABLET | ORAL | Status: DC

## 2011-03-04 NOTE — Assessment & Plan Note (Addendum)
NYHA III-IIIb. Volume status remains elevated with prn Metolazone. Due to CKD I suspect Lasix is not penetrating therefore will stop Lasix and Metolazone. Will start Toresemide 40 mg bid. She continues to decline ICD. Follow up in one week . Will check bmet next week.    Patient seen and examined with Darrick Grinder, NP. We discussed all aspects of the encounter. I agree with the assessment and plan as stated above.
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
## 2011-03-04 NOTE — Patient Instructions (Addendum)
  Stop Metolazone  Stop Furosemide  Take Demadex 40 mg in am and pm  Please continue to weigh and record daily  Follow up in one week.

## 2011-03-09 ENCOUNTER — Ambulatory Visit (HOSPITAL_COMMUNITY)
Admission: RE | Admit: 2011-03-09 | Discharge: 2011-03-09 | Disposition: A | Discharge status: Home / Self Care | Payer: Federal, State, Local not specified - PPO | Source: Ambulatory Visit | Attending: Internal Medicine | Admitting: Internal Medicine

## 2011-03-09 DIAGNOSIS — I5022 Chronic systolic (congestive) heart failure: Secondary | ICD-10-CM

## 2011-03-09 LAB — BASIC METABOLIC PANEL
Chloride: 97 mEq/L (ref 96–112)
GFR calc Af Amer: 25 mL/min — ABNORMAL LOW (ref >90)
Potassium: 4.5 mEq/L (ref 3.5–5.1)

## 2011-03-09 NOTE — Assessment & Plan Note (Addendum)
NYHA III-IIIb. Volume status mildly elevated. Continue Toresemide 40 mg bid. She continues to decline ICD.  Will check bmet today.  Follow up in three weeks.   Patient seen and examined with Darrick Grinder, NP. We discussed all aspects of the encounter. I agree with the assessment and plan as stated above.

## 2011-03-09 NOTE — Patient Instructions (Signed)
Please in 3-4 weeks  Continue to weigh and record daily
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
## 2011-03-12 ENCOUNTER — Encounter (HOSPITAL_COMMUNITY): Payer: Federal, State, Local not specified - PPO

## 2011-03-18 NOTE — Progress Notes (Signed)
Patient seen and examined with Amy Clegg, NP. We discussed all aspects of the encounter. I agree with the assessment and plan as stated above.   

## 2011-04-05 ENCOUNTER — Encounter (HOSPITAL_COMMUNITY): Payer: Federal, State, Local not specified - PPO

## 2011-04-12 ENCOUNTER — Ambulatory Visit (HOSPITAL_COMMUNITY)
Admission: RE | Admit: 2011-04-12 | Discharge: 2011-04-12 | Disposition: A | Discharge status: Home / Self Care | Payer: Federal, State, Local not specified - PPO | Source: Ambulatory Visit | Attending: Internal Medicine | Admitting: Internal Medicine

## 2011-04-12 VITALS — BP 132/78 | HR 95 | Wt 244.2 lb

## 2011-04-12 DIAGNOSIS — I5022 Chronic systolic (congestive) heart failure: Secondary | ICD-10-CM | POA: Insufficient documentation

## 2011-04-12 MED ORDER — HYDRALAZINE HCL 25 MG PO TABS: 37.5000 mg | ORAL_TABLET | Freq: Three times a day (TID) | ORAL | Status: DC

## 2011-04-12 NOTE — Assessment & Plan Note (Addendum)
Volume status up today.  NYHA III.  Will increase demadex 60/40 mg for 3 days then back to 40/40 mg if weight back down to 237.  Will also titrate hydralazine 37.5 mg TID.  BMET next visit.   Patient seen and examined with Darrick Grinder, NP. We discussed all aspects of the encounter. I agree with the assessment and plan as stated above.  Doing fairly well. Reinforced need for fluid restriction. Agree with med changes as above. Continues to refuse ICD.

## 2011-04-12 NOTE — Patient Instructions (Signed)
Increase demadex 60 mg (3 tabs) in am and 40 mg (2 tabs) in pm until weight back down 4 lbs.  If weight not back down by Thursday call the clinic.    Increase hydralazine 37.5 mg (1.5 tabs) three times a day.   Follow up in 1 month.    Do the following things EVERYDAY: 1) Weigh yourself in the morning before breakfast. Write it down and keep it in a log. 2) Take your medicines as prescribed 3) Eat low salt foods-Limit salt (sodium) to 2000mg  per day.  4) Stay as active as you can everyday
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
## 2011-04-12 NOTE — Assessment & Plan Note (Signed)
Increase hydralazine, as above.

## 2011-05-17 ENCOUNTER — Ambulatory Visit (HOSPITAL_COMMUNITY)
Admission: RE | Admit: 2011-05-17 | Discharge: 2011-05-17 | Disposition: A | Discharge status: Home / Self Care | Payer: Federal, State, Local not specified - PPO | Source: Ambulatory Visit | Attending: Internal Medicine | Admitting: Internal Medicine

## 2011-05-17 VITALS — BP 126/78 | HR 99 | Wt 240.8 lb

## 2011-05-17 DIAGNOSIS — I5022 Chronic systolic (congestive) heart failure: Secondary | ICD-10-CM | POA: Insufficient documentation

## 2011-05-17 DIAGNOSIS — I428 Other cardiomyopathies: Secondary | ICD-10-CM
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
## 2011-05-17 NOTE — Patient Instructions (Signed)
Increase hydralazine to 2 tablets (50 mg) three times daily.  Follow up 6 weeks with and echo.  Your physician has requested that you have an echocardiogram. Echocardiography is a painless test that uses sound waves to create images of your heart. It provides your doctor with information about the size and shape of your heart and how well your heart's chambers and valves are working. This procedure takes approximately one hour. There are no restrictions for this procedure.  Do the following things EVERYDAY: 1) Weigh yourself in the morning before breakfast. Write it down and keep it in a log. 2) Take your medicines as prescribed 3) Eat low salt foods-Limit salt (sodium) to 2000mg  per day.  4) Stay as active as you can everyday

## 2011-05-17 NOTE — Assessment & Plan Note (Addendum)
NYHA III with volume status stable.  Will increase Hydralazine 50 mg TID.  We discussed risks/indications of ICD with her and her husband at length. She realizes that she is at increased risk for SCD however she declines at this time. Follow up in 6 weeks with echo.   Patient seen and examined with Darrick Grinder, NP. We discussed all aspects of the encounter. I agree with the assessment and plan as stated above. Doing quite well. Functional capacity improving. Continues to refuse ICD. Increase hydralazine as BP tolerates.

## 2011-06-23 ENCOUNTER — Ambulatory Visit (HOSPITAL_COMMUNITY)
Admission: RE | Admit: 2011-06-23 | Discharge: 2011-06-23 | Disposition: A | Discharge status: Home / Self Care | Payer: Federal, State, Local not specified - PPO | Source: Ambulatory Visit | Attending: Physician Assistant | Admitting: Physician Assistant

## 2011-06-23 DIAGNOSIS — I5022 Chronic systolic (congestive) heart failure: Secondary | ICD-10-CM

## 2011-06-23 DIAGNOSIS — E119 Type 2 diabetes mellitus without complications: Secondary | ICD-10-CM | POA: Insufficient documentation

## 2011-06-23 DIAGNOSIS — I079 Rheumatic tricuspid valve disease, unspecified: Secondary | ICD-10-CM | POA: Insufficient documentation

## 2011-06-23 DIAGNOSIS — I509 Heart failure, unspecified: Secondary | ICD-10-CM | POA: Insufficient documentation

## 2011-06-23 DIAGNOSIS — I10 Essential (primary) hypertension: Secondary | ICD-10-CM | POA: Insufficient documentation

## 2011-06-23 DIAGNOSIS — I369 Nonrheumatic tricuspid valve disorder, unspecified: Secondary | ICD-10-CM

## 2011-06-23 NOTE — Progress Notes (Signed)
Encounter addended by: Scarlette Calico, RN on: 06/23/2011  2:05 PM<BR>     Documentation filed: Orders

## 2011-06-28 ENCOUNTER — Ambulatory Visit (HOSPITAL_COMMUNITY)
Admission: RE | Admit: 2011-06-28 | Discharge: 2011-06-28 | Disposition: A | Discharge status: Home / Self Care | Payer: Federal, State, Local not specified - PPO | Source: Ambulatory Visit | Attending: Internal Medicine | Admitting: Internal Medicine

## 2011-06-28 VITALS — BP 118/84 | HR 80 | Wt 242.0 lb

## 2011-06-28 DIAGNOSIS — K219 Gastro-esophageal reflux disease without esophagitis: Secondary | ICD-10-CM | POA: Insufficient documentation

## 2011-06-28 DIAGNOSIS — N189 Chronic kidney disease, unspecified: Secondary | ICD-10-CM | POA: Insufficient documentation

## 2011-06-28 DIAGNOSIS — I5022 Chronic systolic (congestive) heart failure: Secondary | ICD-10-CM

## 2011-06-28 DIAGNOSIS — I10 Essential (primary) hypertension: Secondary | ICD-10-CM | POA: Insufficient documentation

## 2011-06-28 DIAGNOSIS — I509 Heart failure, unspecified: Secondary | ICD-10-CM | POA: Insufficient documentation

## 2011-06-28 DIAGNOSIS — I2589 Other forms of chronic ischemic heart disease: Secondary | ICD-10-CM | POA: Insufficient documentation

## 2011-06-28 DIAGNOSIS — I129 Hypertensive chronic kidney disease with stage 1 through stage 4 chronic kidney disease, or unspecified chronic kidney disease: Secondary | ICD-10-CM | POA: Insufficient documentation

## 2011-06-28 DIAGNOSIS — IMO0001 Reserved for inherently not codable concepts without codable children: Secondary | ICD-10-CM | POA: Insufficient documentation
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
## 2011-06-28 NOTE — Assessment & Plan Note (Addendum)
Volume status looks good today.  Discussed echo results in full, answered all questions.  Will continue with current regimen.  She will have labs with Dr. Wilson Singer tomorrow.  Risk, benefits and indications for ICD were discussed and she refuses implantation at this time.    Patient seen and examined with Leone Haven PA-C. We discussed all aspects of the encounter. I agree with the assessment and plan as stated above. Echo reviewed EF remains 20-25%. Despite severe LV dysfunction she is doing well. Volume status looks good on exam and functional status improving. Continues to refuse ICD.

## 2011-06-28 NOTE — Assessment & Plan Note (Signed)
Stable.       - Continue to monitor

## 2011-06-28 NOTE — Patient Instructions (Addendum)
Continue current medications.    Follow up 3 months.   Do the following things EVERYDAY: 1) Weigh yourself in the morning before breakfast. Write it down and keep it in a log. 2) Take your medicines as prescribed 3) Eat low salt foods-Limit salt (sodium) to 2000mg  per day.  4) Stay as active as you can everyday

## 2011-09-22 ENCOUNTER — Other Ambulatory Visit (HOSPITAL_COMMUNITY): Payer: Self-pay | Admitting: *Deleted

## 2011-09-22 MED ORDER — TORSEMIDE 20 MG PO TABS: 40.0000 mg | ORAL_TABLET | Freq: Two times a day (BID) | ORAL | Status: DC

## 2011-09-30 ENCOUNTER — Ambulatory Visit (HOSPITAL_COMMUNITY)
Admission: RE | Admit: 2011-09-30 | Discharge: 2011-09-30 | Disposition: A | Discharge status: Home / Self Care | Payer: Federal, State, Local not specified - PPO | Source: Ambulatory Visit | Attending: Internal Medicine | Admitting: Internal Medicine

## 2011-09-30 ENCOUNTER — Encounter (HOSPITAL_COMMUNITY): Payer: Self-pay

## 2011-09-30 VITALS — BP 120/79 | HR 90 | Ht 62.0 in | Wt 241.0 lb

## 2011-09-30 DIAGNOSIS — Z8673 Personal history of transient ischemic attack (TIA), and cerebral infarction without residual deficits: Secondary | ICD-10-CM | POA: Insufficient documentation

## 2011-09-30 DIAGNOSIS — I428 Other cardiomyopathies: Secondary | ICD-10-CM | POA: Insufficient documentation

## 2011-09-30 DIAGNOSIS — N189 Chronic kidney disease, unspecified: Secondary | ICD-10-CM | POA: Insufficient documentation

## 2011-09-30 DIAGNOSIS — E119 Type 2 diabetes mellitus without complications: Secondary | ICD-10-CM | POA: Insufficient documentation

## 2011-09-30 DIAGNOSIS — Z7982 Long term (current) use of aspirin: Secondary | ICD-10-CM | POA: Insufficient documentation

## 2011-09-30 DIAGNOSIS — I252 Old myocardial infarction: Secondary | ICD-10-CM | POA: Insufficient documentation

## 2011-09-30 DIAGNOSIS — I2510 Atherosclerotic heart disease of native coronary artery without angina pectoris: Secondary | ICD-10-CM | POA: Insufficient documentation

## 2011-09-30 DIAGNOSIS — E669 Obesity, unspecified: Secondary | ICD-10-CM | POA: Insufficient documentation

## 2011-09-30 DIAGNOSIS — I5022 Chronic systolic (congestive) heart failure: Secondary | ICD-10-CM | POA: Insufficient documentation

## 2011-09-30 DIAGNOSIS — I509 Heart failure, unspecified: Secondary | ICD-10-CM | POA: Insufficient documentation

## 2011-09-30 DIAGNOSIS — I10 Essential (primary) hypertension: Secondary | ICD-10-CM | POA: Insufficient documentation

## 2011-09-30 DIAGNOSIS — K219 Gastro-esophageal reflux disease without esophagitis: Secondary | ICD-10-CM | POA: Insufficient documentation

## 2011-09-30 NOTE — Assessment & Plan Note (Signed)
Have encouraged her to watch portion sizes.  Have also asked her to start walking 20 minutes a day.  She is agreeable to this plan.
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
## 2011-09-30 NOTE — Assessment & Plan Note (Signed)
Well controlled, continue current therapy. 

## 2011-09-30 NOTE — Assessment & Plan Note (Signed)
Mrs. Stehl is doing well.  She is getting around more, NYHA III.  Her volume status looks good today.  Have discussed sliding scale lasix and daily weights.  She voices understanding.  Have discussed risk, benefits and indications for ICD placement but she continues to decline.  Tolerating therapy at this time, will continue.

## 2011-09-30 NOTE — Patient Instructions (Signed)
Continue current therapy.  Follow up 3 months with Dr. Haroldine Laws.    Do the following things EVERYDAY: 1) Weigh yourself in the morning before breakfast. Write it down and keep it in a log. 2) Take your medicines as prescribed 3) Eat low salt foods--Limit salt (sodium) to 2000 mg per day.  4) Stay as active as you can everyday 5) Limit all fluids for the day to less than 2 liters

## 2011-10-07 ENCOUNTER — Telehealth (HOSPITAL_COMMUNITY): Payer: Self-pay | Admitting: *Deleted

## 2011-10-07 MED ORDER — TORSEMIDE 20 MG PO TABS: ORAL_TABLET | ORAL | Status: DC

## 2011-10-07 NOTE — Telephone Encounter (Signed)
Received labs from [facility], pt's bun 81 cr 2.4 per Dr Haroldine Laws decrease Torsemide to 40 mg in AM and 20 mg in PM pt is aware and verbalizes understanding, she is aware that she can take extra tab if wt increasing

## 2011-10-12 ENCOUNTER — Encounter (HOSPITAL_COMMUNITY): Payer: Self-pay

## 2011-11-30 DIAGNOSIS — E119 Type 2 diabetes mellitus without complications: Secondary | ICD-10-CM | POA: Diagnosis not present

## 2011-11-30 DIAGNOSIS — R0609 Other forms of dyspnea: Secondary | ICD-10-CM | POA: Diagnosis not present

## 2011-11-30 DIAGNOSIS — E789 Disorder of lipoprotein metabolism, unspecified: Secondary | ICD-10-CM | POA: Diagnosis not present

## 2011-11-30 DIAGNOSIS — IMO0001 Reserved for inherently not codable concepts without codable children: Secondary | ICD-10-CM | POA: Diagnosis not present

## 2011-11-30 DIAGNOSIS — R0989 Other specified symptoms and signs involving the circulatory and respiratory systems: Secondary | ICD-10-CM | POA: Diagnosis not present

## 2011-11-30 DIAGNOSIS — I10 Essential (primary) hypertension: Secondary | ICD-10-CM | POA: Diagnosis not present

## 2011-12-07 DIAGNOSIS — E119 Type 2 diabetes mellitus without complications: Secondary | ICD-10-CM | POA: Diagnosis not present

## 2012-01-20 ENCOUNTER — Other Ambulatory Visit: Payer: Self-pay | Admitting: Internal Medicine

## 2012-01-20 NOTE — Telephone Encounter (Signed)
F/u   [facility], [place] [phone], needs a RX reflecting dosage increase. plz return call to pharmacy

## 2012-01-24 DIAGNOSIS — N390 Urinary tract infection, site not specified: Secondary | ICD-10-CM | POA: Diagnosis not present

## 2012-02-01 ENCOUNTER — Other Ambulatory Visit: Payer: Self-pay | Admitting: Endocrinology

## 2012-02-01 DIAGNOSIS — N390 Urinary tract infection, site not specified: Secondary | ICD-10-CM | POA: Diagnosis not present

## 2012-02-01 DIAGNOSIS — R102 Pelvic and perineal pain: Secondary | ICD-10-CM

## 2012-02-01 DIAGNOSIS — N9489 Other specified conditions associated with female genital organs and menstrual cycle: Secondary | ICD-10-CM | POA: Diagnosis not present

## 2012-02-04 ENCOUNTER — Ambulatory Visit
Admission: RE | Admit: 2012-02-04 | Discharge: 2012-02-04 | Disposition: A | Discharge status: Home / Self Care | Payer: Federal, State, Local not specified - PPO | Source: Ambulatory Visit | Attending: Endocrinology | Admitting: Endocrinology

## 2012-02-04 ENCOUNTER — Ambulatory Visit
Admission: RE | Admit: 2012-02-04 | Discharge: 2012-02-04 | Disposition: A | Discharge status: Home / Self Care | Payer: Medicare Other | Source: Ambulatory Visit | Attending: Endocrinology | Admitting: Endocrinology

## 2012-02-04 DIAGNOSIS — R102 Pelvic and perineal pain: Secondary | ICD-10-CM

## 2012-02-04 DIAGNOSIS — N9489 Other specified conditions associated with female genital organs and menstrual cycle: Secondary | ICD-10-CM | POA: Diagnosis not present

## 2012-02-25 DIAGNOSIS — N390 Urinary tract infection, site not specified: Secondary | ICD-10-CM | POA: Diagnosis not present

## 2012-02-25 DIAGNOSIS — E789 Disorder of lipoprotein metabolism, unspecified: Secondary | ICD-10-CM | POA: Diagnosis not present

## 2012-02-25 DIAGNOSIS — I10 Essential (primary) hypertension: Secondary | ICD-10-CM | POA: Diagnosis not present

## 2012-02-25 DIAGNOSIS — E119 Type 2 diabetes mellitus without complications: Secondary | ICD-10-CM | POA: Diagnosis not present

## 2012-03-06 DIAGNOSIS — R300 Dysuria: Secondary | ICD-10-CM | POA: Diagnosis not present

## 2012-03-06 DIAGNOSIS — N949 Unspecified condition associated with female genital organs and menstrual cycle: Secondary | ICD-10-CM | POA: Diagnosis not present

## 2012-03-06 DIAGNOSIS — R32 Unspecified urinary incontinence: Secondary | ICD-10-CM | POA: Diagnosis not present

## 2012-03-07 DIAGNOSIS — S199XXA Unspecified injury of neck, initial encounter: Secondary | ICD-10-CM | POA: Diagnosis not present

## 2012-03-07 DIAGNOSIS — S46909A Unspecified injury of unspecified muscle, fascia and tendon at shoulder and upper arm level, unspecified arm, initial encounter: Secondary | ICD-10-CM | POA: Diagnosis not present

## 2012-03-07 DIAGNOSIS — M542 Cervicalgia: Secondary | ICD-10-CM | POA: Diagnosis not present

## 2012-03-07 DIAGNOSIS — M25519 Pain in unspecified shoulder: Secondary | ICD-10-CM | POA: Diagnosis not present

## 2012-03-07 DIAGNOSIS — R05 Cough: Secondary | ICD-10-CM | POA: Diagnosis not present

## 2012-03-07 DIAGNOSIS — S4980XA Other specified injuries of shoulder and upper arm, unspecified arm, initial encounter: Secondary | ICD-10-CM | POA: Diagnosis not present

## 2012-03-07 DIAGNOSIS — R059 Cough, unspecified: Secondary | ICD-10-CM | POA: Diagnosis not present

## 2012-03-07 DIAGNOSIS — M79609 Pain in unspecified limb: Secondary | ICD-10-CM | POA: Diagnosis not present

## 2012-03-07 DIAGNOSIS — S0993XA Unspecified injury of face, initial encounter: Secondary | ICD-10-CM | POA: Diagnosis not present

## 2012-03-09 DIAGNOSIS — M19019 Primary osteoarthritis, unspecified shoulder: Secondary | ICD-10-CM | POA: Diagnosis not present

## 2012-03-14 DIAGNOSIS — M79609 Pain in unspecified limb: Secondary | ICD-10-CM | POA: Diagnosis not present

## 2012-03-21 ENCOUNTER — Ambulatory Visit: Payer: Federal, State, Local not specified - PPO | Admitting: Physical Therapy

## 2012-03-27 ENCOUNTER — Ambulatory Visit: Payer: Medicare Other | Attending: Endocrinology | Admitting: Physical Therapy

## 2012-03-27 DIAGNOSIS — M25519 Pain in unspecified shoulder: Secondary | ICD-10-CM | POA: Insufficient documentation

## 2012-03-27 DIAGNOSIS — IMO0001 Reserved for inherently not codable concepts without codable children: Secondary | ICD-10-CM | POA: Diagnosis not present

## 2012-03-27 DIAGNOSIS — R293 Abnormal posture: Secondary | ICD-10-CM | POA: Diagnosis not present

## 2012-03-28 DIAGNOSIS — R059 Cough, unspecified: Secondary | ICD-10-CM | POA: Diagnosis not present

## 2012-03-28 DIAGNOSIS — R0609 Other forms of dyspnea: Secondary | ICD-10-CM | POA: Diagnosis not present

## 2012-03-28 DIAGNOSIS — R0989 Other specified symptoms and signs involving the circulatory and respiratory systems: Secondary | ICD-10-CM | POA: Diagnosis not present

## 2012-03-28 DIAGNOSIS — I10 Essential (primary) hypertension: Secondary | ICD-10-CM | POA: Diagnosis not present

## 2012-03-28 DIAGNOSIS — E119 Type 2 diabetes mellitus without complications: Secondary | ICD-10-CM | POA: Diagnosis not present

## 2012-03-28 DIAGNOSIS — R05 Cough: Secondary | ICD-10-CM | POA: Diagnosis not present

## 2012-03-29 ENCOUNTER — Ambulatory Visit: Payer: Medicare Other | Admitting: Rehabilitative and Restorative Service Providers"

## 2012-03-30 DIAGNOSIS — E119 Type 2 diabetes mellitus without complications: Secondary | ICD-10-CM | POA: Diagnosis not present

## 2012-03-30 DIAGNOSIS — N189 Chronic kidney disease, unspecified: Secondary | ICD-10-CM | POA: Diagnosis not present

## 2012-03-30 DIAGNOSIS — E789 Disorder of lipoprotein metabolism, unspecified: Secondary | ICD-10-CM | POA: Diagnosis not present

## 2012-04-04 ENCOUNTER — Encounter: Payer: Medicare Other | Admitting: Physical Therapy

## 2012-04-05 DIAGNOSIS — R0609 Other forms of dyspnea: Secondary | ICD-10-CM | POA: Diagnosis not present

## 2012-04-05 DIAGNOSIS — I10 Essential (primary) hypertension: Secondary | ICD-10-CM | POA: Diagnosis not present

## 2012-04-05 DIAGNOSIS — E0789 Other specified disorders of thyroid: Secondary | ICD-10-CM | POA: Diagnosis not present

## 2012-04-05 DIAGNOSIS — R0989 Other specified symptoms and signs involving the circulatory and respiratory systems: Secondary | ICD-10-CM | POA: Diagnosis not present

## 2012-04-05 DIAGNOSIS — E789 Disorder of lipoprotein metabolism, unspecified: Secondary | ICD-10-CM | POA: Diagnosis not present

## 2012-04-06 ENCOUNTER — Ambulatory Visit: Payer: Medicare Other | Admitting: Physical Therapy

## 2012-04-07 ENCOUNTER — Other Ambulatory Visit (HOSPITAL_COMMUNITY): Payer: Self-pay | Admitting: *Deleted

## 2012-04-07 MED ORDER — TORSEMIDE 20 MG PO TABS: ORAL_TABLET | ORAL | Status: DC

## 2012-04-10 ENCOUNTER — Ambulatory Visit: Payer: Medicare Other | Admitting: Physical Therapy

## 2012-04-12 ENCOUNTER — Ambulatory Visit: Payer: Medicare Other | Admitting: Physical Therapy

## 2012-04-17 ENCOUNTER — Encounter: Payer: Self-pay | Admitting: Internal Medicine

## 2012-04-17 DIAGNOSIS — H43819 Vitreous degeneration, unspecified eye: Secondary | ICD-10-CM | POA: Diagnosis not present

## 2012-04-17 DIAGNOSIS — E1139 Type 2 diabetes mellitus with other diabetic ophthalmic complication: Secondary | ICD-10-CM | POA: Diagnosis not present

## 2012-04-17 DIAGNOSIS — H02839 Dermatochalasis of unspecified eye, unspecified eyelid: Secondary | ICD-10-CM | POA: Diagnosis not present

## 2012-04-17 DIAGNOSIS — H43399 Other vitreous opacities, unspecified eye: Secondary | ICD-10-CM | POA: Diagnosis not present

## 2012-04-17 DIAGNOSIS — H2510 Age-related nuclear cataract, unspecified eye: Secondary | ICD-10-CM | POA: Diagnosis not present

## 2012-04-17 DIAGNOSIS — H538 Other visual disturbances: Secondary | ICD-10-CM | POA: Diagnosis not present

## 2012-04-17 DIAGNOSIS — E11319 Type 2 diabetes mellitus with unspecified diabetic retinopathy without macular edema: Secondary | ICD-10-CM | POA: Diagnosis not present

## 2012-04-24 ENCOUNTER — Ambulatory Visit: Payer: Medicare Other | Admitting: Rehabilitative and Restorative Service Providers"

## 2012-04-27 ENCOUNTER — Encounter: Payer: Medicare Other | Admitting: Rehabilitative and Restorative Service Providers"

## 2012-04-27 DIAGNOSIS — R059 Cough, unspecified: Secondary | ICD-10-CM | POA: Diagnosis not present

## 2012-04-27 DIAGNOSIS — E789 Disorder of lipoprotein metabolism, unspecified: Secondary | ICD-10-CM | POA: Diagnosis not present

## 2012-04-27 DIAGNOSIS — E119 Type 2 diabetes mellitus without complications: Secondary | ICD-10-CM | POA: Diagnosis not present

## 2012-04-27 DIAGNOSIS — R05 Cough: Secondary | ICD-10-CM | POA: Diagnosis not present

## 2012-04-29 ENCOUNTER — Emergency Department (HOSPITAL_COMMUNITY)
Admission: EM | Admit: 2012-04-29 | Discharge: 2012-04-30 | Disposition: A | Discharge status: Home / Self Care | Payer: Medicare Other | Attending: Emergency Medicine | Admitting: Emergency Medicine

## 2012-04-29 ENCOUNTER — Encounter (HOSPITAL_COMMUNITY): Payer: Self-pay

## 2012-04-29 DIAGNOSIS — Z8679 Personal history of other diseases of the circulatory system: Secondary | ICD-10-CM | POA: Diagnosis not present

## 2012-04-29 DIAGNOSIS — R040 Epistaxis: Secondary | ICD-10-CM | POA: Diagnosis not present

## 2012-04-29 DIAGNOSIS — E669 Obesity, unspecified: Secondary | ICD-10-CM | POA: Insufficient documentation

## 2012-04-29 DIAGNOSIS — Z87828 Personal history of other (healed) physical injury and trauma: Secondary | ICD-10-CM | POA: Diagnosis not present

## 2012-04-29 DIAGNOSIS — I129 Hypertensive chronic kidney disease with stage 1 through stage 4 chronic kidney disease, or unspecified chronic kidney disease: Secondary | ICD-10-CM | POA: Diagnosis not present

## 2012-04-29 DIAGNOSIS — Z7982 Long term (current) use of aspirin: Secondary | ICD-10-CM | POA: Insufficient documentation

## 2012-04-29 DIAGNOSIS — Z8673 Personal history of transient ischemic attack (TIA), and cerebral infarction without residual deficits: Secondary | ICD-10-CM | POA: Insufficient documentation

## 2012-04-29 DIAGNOSIS — Z7902 Long term (current) use of antithrombotics/antiplatelets: Secondary | ICD-10-CM | POA: Diagnosis not present

## 2012-04-29 DIAGNOSIS — N189 Chronic kidney disease, unspecified: Secondary | ICD-10-CM | POA: Insufficient documentation

## 2012-04-29 DIAGNOSIS — Z79899 Other long term (current) drug therapy: Secondary | ICD-10-CM | POA: Diagnosis not present

## 2012-04-29 DIAGNOSIS — I252 Old myocardial infarction: Secondary | ICD-10-CM | POA: Insufficient documentation

## 2012-04-29 DIAGNOSIS — I509 Heart failure, unspecified: Secondary | ICD-10-CM | POA: Insufficient documentation

## 2012-04-29 DIAGNOSIS — E119 Type 2 diabetes mellitus without complications: Secondary | ICD-10-CM | POA: Insufficient documentation

## 2012-04-29 DIAGNOSIS — Z8669 Personal history of other diseases of the nervous system and sense organs: Secondary | ICD-10-CM | POA: Diagnosis not present

## 2012-04-29 DIAGNOSIS — Z8739 Personal history of other diseases of the musculoskeletal system and connective tissue: Secondary | ICD-10-CM | POA: Insufficient documentation

## 2012-04-29 DIAGNOSIS — Z8719 Personal history of other diseases of the digestive system: Secondary | ICD-10-CM | POA: Insufficient documentation

## 2012-04-29 MED ORDER — OXYMETAZOLINE HCL 0.05 % NA SOLN
2.0000 | Freq: Once | NASAL | Status: AC
  Administered 2012-04-29: 2 via NASAL
  Filled 2012-04-29: qty 30
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
## 2012-04-29 NOTE — ED Notes (Signed)
Was d/c'ing the patient to home and her nose started to rebleed

## 2012-04-29 NOTE — ED Notes (Signed)
Patient reports that she still feels blood in the back of her throat

## 2012-04-29 NOTE — ED Notes (Signed)
Per pt, spontaneous nosebleed tonight at 8pm. Pt takes plavix and asa.  No trauma to nose.  Rt nasal noted with slight blood.

## 2012-04-30 NOTE — ED Provider Notes (Signed)
Medical screening examination/treatment/procedure(s) were performed by non-physician practitioner and as supervising physician I was immediately available for consultation/collaboration. Rolland Porter, MD, Abram Sander   Janice Norrie, MD 04/30/12 Laureen Abrahams

## 2012-05-01 ENCOUNTER — Encounter: Payer: Medicare Other | Admitting: Physical Therapy

## 2012-05-04 ENCOUNTER — Encounter: Payer: Medicare Other | Admitting: Physical Therapy

## 2012-05-08 DIAGNOSIS — N183 Chronic kidney disease, stage 3 unspecified: Secondary | ICD-10-CM | POA: Diagnosis not present

## 2012-05-12 ENCOUNTER — Encounter: Payer: Self-pay | Admitting: Internal Medicine

## 2012-05-12 ENCOUNTER — Ambulatory Visit (INDEPENDENT_AMBULATORY_CARE_PROVIDER_SITE_OTHER): Payer: Federal, State, Local not specified - PPO | Admitting: Internal Medicine

## 2012-05-12 VITALS — BP 112/70 | HR 100 | Ht 62.5 in | Wt 244.6 lb

## 2012-05-12 DIAGNOSIS — Z1211 Encounter for screening for malignant neoplasm of colon: Secondary | ICD-10-CM | POA: Diagnosis not present

## 2012-05-12 DIAGNOSIS — K219 Gastro-esophageal reflux disease without esophagitis: Secondary | ICD-10-CM | POA: Diagnosis not present

## 2012-05-12 NOTE — Patient Instructions (Addendum)
I have scheduled a recall colonoscopy for you for 09/2015  Please follow up with Dr. Henrene Pastor as needed
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
## 2012-06-20 DIAGNOSIS — R32 Unspecified urinary incontinence: Secondary | ICD-10-CM | POA: Diagnosis not present

## 2012-06-20 DIAGNOSIS — R300 Dysuria: Secondary | ICD-10-CM | POA: Diagnosis not present

## 2012-06-20 DIAGNOSIS — N816 Rectocele: Secondary | ICD-10-CM | POA: Diagnosis not present

## 2012-07-05 DIAGNOSIS — E119 Type 2 diabetes mellitus without complications: Secondary | ICD-10-CM | POA: Diagnosis not present

## 2012-07-05 DIAGNOSIS — R0609 Other forms of dyspnea: Secondary | ICD-10-CM | POA: Diagnosis not present

## 2012-07-05 DIAGNOSIS — R0989 Other specified symptoms and signs involving the circulatory and respiratory systems: Secondary | ICD-10-CM | POA: Diagnosis not present

## 2012-07-05 DIAGNOSIS — G589 Mononeuropathy, unspecified: Secondary | ICD-10-CM | POA: Diagnosis not present

## 2012-07-05 DIAGNOSIS — R059 Cough, unspecified: Secondary | ICD-10-CM | POA: Diagnosis not present

## 2012-07-27 ENCOUNTER — Encounter (HOSPITAL_COMMUNITY): Payer: Self-pay

## 2012-07-27 ENCOUNTER — Telehealth (HOSPITAL_COMMUNITY): Payer: Self-pay | Admitting: Adult Health

## 2012-07-27 ENCOUNTER — Ambulatory Visit (HOSPITAL_COMMUNITY)
Admission: RE | Admit: 2012-07-27 | Discharge: 2012-07-27 | Disposition: A | Discharge status: Home / Self Care | Payer: Medicare Other | Source: Ambulatory Visit | Attending: Internal Medicine | Admitting: Internal Medicine

## 2012-07-27 VITALS — BP 140/80 | HR 100 | Wt 246.0 lb

## 2012-07-27 DIAGNOSIS — I5022 Chronic systolic (congestive) heart failure: Secondary | ICD-10-CM | POA: Insufficient documentation

## 2012-07-27 DIAGNOSIS — I509 Heart failure, unspecified: Secondary | ICD-10-CM | POA: Diagnosis not present

## 2012-07-27 LAB — BASIC METABOLIC PANEL
Calcium: 9.1 mg/dL (ref 8.4–10.5)
GFR calc Af Amer: 22 mL/min — ABNORMAL LOW (ref >90)
GFR calc non Af Amer: 19 mL/min — ABNORMAL LOW (ref >90)
Sodium: 139 mEq/L (ref 135–145)

## 2012-07-27 MED ORDER — TORSEMIDE 20 MG PO TABS: 20.0000 mg | ORAL_TABLET | Freq: Two times a day (BID) | ORAL | Status: DC

## 2012-07-27 NOTE — Patient Instructions (Addendum)
   Follow up in 4 months  Do the following things EVERYDAY: 1) Weigh yourself in the morning before breakfast. Write it down and keep it in a log. 2) Take your medicines as prescribed 3) Eat low salt foods-Limit salt (sodium) to 2000 mg per day.  4) Stay as active as you can everyday 5) Limit all fluids for the day to less than 2 liters 

## 2012-07-27 NOTE — Assessment & Plan Note (Signed)
NYHA III. Volume status stable. Continue current diuretic regimen. Check BMET today. Schedule ECHO. Follow up in 3 months.

## 2012-07-27 NOTE — Telephone Encounter (Signed)
Provided lab results. Renal function up. Instructed to hold Torsemide for 2 days. On Sunday (07/30/12) take Torsemide 20 mg twice a day. Check BMET on 07/31/12 at [facility] cardiology.  Grace Bradford verbalized understanding.
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
## 2012-07-27 NOTE — Addendum Note (Signed)
Encounter addended by: Kerry Dory, CMA on: 07/27/2012  2:26 PM<BR>     Documentation filed: Visit Diagnoses, Orders

## 2012-08-01 ENCOUNTER — Other Ambulatory Visit (INDEPENDENT_AMBULATORY_CARE_PROVIDER_SITE_OTHER): Payer: Medicare Other

## 2012-08-01 DIAGNOSIS — I5022 Chronic systolic (congestive) heart failure: Secondary | ICD-10-CM | POA: Diagnosis not present

## 2012-08-01 LAB — BASIC METABOLIC PANEL
GFR: 30.85 mL/min — ABNORMAL LOW (ref >60.00)
Potassium: 4 mEq/L (ref 3.5–5.1)
Sodium: 140 mEq/L (ref 135–145)

## 2012-08-03 ENCOUNTER — Ambulatory Visit (HOSPITAL_COMMUNITY)
Admission: RE | Admit: 2012-08-03 | Discharge: 2012-08-03 | Disposition: A | Discharge status: Home / Self Care | Payer: Medicare Other | Source: Ambulatory Visit | Attending: Internal Medicine | Admitting: Internal Medicine

## 2012-08-03 DIAGNOSIS — I252 Old myocardial infarction: Secondary | ICD-10-CM | POA: Insufficient documentation

## 2012-08-03 DIAGNOSIS — J449 Chronic obstructive pulmonary disease, unspecified: Secondary | ICD-10-CM | POA: Diagnosis not present

## 2012-08-03 DIAGNOSIS — I509 Heart failure, unspecified: Secondary | ICD-10-CM | POA: Insufficient documentation

## 2012-08-03 DIAGNOSIS — Z8673 Personal history of transient ischemic attack (TIA), and cerebral infarction without residual deficits: Secondary | ICD-10-CM | POA: Insufficient documentation

## 2012-08-03 DIAGNOSIS — I10 Essential (primary) hypertension: Secondary | ICD-10-CM | POA: Diagnosis not present

## 2012-08-03 DIAGNOSIS — I5022 Chronic systolic (congestive) heart failure: Secondary | ICD-10-CM

## 2012-08-03 DIAGNOSIS — J4489 Other specified chronic obstructive pulmonary disease: Secondary | ICD-10-CM | POA: Insufficient documentation

## 2012-08-03 DIAGNOSIS — E785 Hyperlipidemia, unspecified: Secondary | ICD-10-CM | POA: Insufficient documentation

## 2012-08-03 DIAGNOSIS — E119 Type 2 diabetes mellitus without complications: Secondary | ICD-10-CM | POA: Diagnosis not present

## 2012-08-03 DIAGNOSIS — I379 Nonrheumatic pulmonary valve disorder, unspecified: Secondary | ICD-10-CM | POA: Diagnosis not present

## 2012-08-03 NOTE — Progress Notes (Signed)
*  PRELIMINARY RESULTS* Echocardiogram 2D Echocardiogram has been performed.  Leavy Cella 08/03/2012, 1:12 PM

## 2012-08-08 DIAGNOSIS — R059 Cough, unspecified: Secondary | ICD-10-CM | POA: Diagnosis not present

## 2012-08-08 DIAGNOSIS — M109 Gout, unspecified: Secondary | ICD-10-CM | POA: Diagnosis not present

## 2012-08-08 DIAGNOSIS — R05 Cough: Secondary | ICD-10-CM | POA: Diagnosis not present

## 2012-08-08 DIAGNOSIS — H9210 Otorrhea, unspecified ear: Secondary | ICD-10-CM | POA: Diagnosis not present

## 2012-08-08 DIAGNOSIS — I509 Heart failure, unspecified: Secondary | ICD-10-CM | POA: Diagnosis not present

## 2012-08-08 DIAGNOSIS — I2510 Atherosclerotic heart disease of native coronary artery without angina pectoris: Secondary | ICD-10-CM | POA: Diagnosis not present

## 2012-08-10 DIAGNOSIS — M542 Cervicalgia: Secondary | ICD-10-CM | POA: Diagnosis not present

## 2012-08-10 DIAGNOSIS — E119 Type 2 diabetes mellitus without complications: Secondary | ICD-10-CM | POA: Diagnosis not present

## 2012-08-10 DIAGNOSIS — M25519 Pain in unspecified shoulder: Secondary | ICD-10-CM | POA: Diagnosis not present

## 2012-08-10 DIAGNOSIS — R0989 Other specified symptoms and signs involving the circulatory and respiratory systems: Secondary | ICD-10-CM | POA: Diagnosis not present

## 2012-08-10 DIAGNOSIS — S0993XA Unspecified injury of face, initial encounter: Secondary | ICD-10-CM | POA: Diagnosis not present

## 2012-08-10 DIAGNOSIS — S4980XA Other specified injuries of shoulder and upper arm, unspecified arm, initial encounter: Secondary | ICD-10-CM | POA: Diagnosis not present

## 2012-08-10 DIAGNOSIS — R059 Cough, unspecified: Secondary | ICD-10-CM | POA: Diagnosis not present

## 2012-08-10 DIAGNOSIS — R05 Cough: Secondary | ICD-10-CM | POA: Diagnosis not present

## 2012-08-10 DIAGNOSIS — S199XXA Unspecified injury of neck, initial encounter: Secondary | ICD-10-CM | POA: Diagnosis not present

## 2012-08-10 DIAGNOSIS — S46909A Unspecified injury of unspecified muscle, fascia and tendon at shoulder and upper arm level, unspecified arm, initial encounter: Secondary | ICD-10-CM | POA: Diagnosis not present

## 2012-08-10 DIAGNOSIS — R0609 Other forms of dyspnea: Secondary | ICD-10-CM | POA: Diagnosis not present

## 2012-08-16 ENCOUNTER — Telehealth (HOSPITAL_COMMUNITY): Payer: Self-pay | Admitting: Adult Health

## 2012-08-16 NOTE — Telephone Encounter (Signed)
Dr Haroldine Laws reviewed ECHO. No change from previous ECHOs.   Left message to return call.   Jakel Alphin NP-C 10:46 AM

## 2012-08-17 DIAGNOSIS — E119 Type 2 diabetes mellitus without complications: Secondary | ICD-10-CM | POA: Diagnosis not present

## 2012-08-17 DIAGNOSIS — R0989 Other specified symptoms and signs involving the circulatory and respiratory systems: Secondary | ICD-10-CM | POA: Diagnosis not present

## 2012-08-17 DIAGNOSIS — R059 Cough, unspecified: Secondary | ICD-10-CM | POA: Diagnosis not present

## 2012-08-17 DIAGNOSIS — R0609 Other forms of dyspnea: Secondary | ICD-10-CM | POA: Diagnosis not present

## 2012-08-17 DIAGNOSIS — R05 Cough: Secondary | ICD-10-CM | POA: Diagnosis not present

## 2012-10-10 DIAGNOSIS — R05 Cough: Secondary | ICD-10-CM | POA: Diagnosis not present

## 2012-10-10 DIAGNOSIS — M79609 Pain in unspecified limb: Secondary | ICD-10-CM | POA: Diagnosis not present

## 2012-10-10 DIAGNOSIS — E119 Type 2 diabetes mellitus without complications: Secondary | ICD-10-CM | POA: Diagnosis not present

## 2012-10-10 DIAGNOSIS — R059 Cough, unspecified: Secondary | ICD-10-CM | POA: Diagnosis not present

## 2012-10-23 DIAGNOSIS — E11319 Type 2 diabetes mellitus with unspecified diabetic retinopathy without macular edema: Secondary | ICD-10-CM | POA: Diagnosis not present

## 2012-10-23 DIAGNOSIS — H43399 Other vitreous opacities, unspecified eye: Secondary | ICD-10-CM | POA: Diagnosis not present

## 2012-10-23 DIAGNOSIS — E1139 Type 2 diabetes mellitus with other diabetic ophthalmic complication: Secondary | ICD-10-CM | POA: Diagnosis not present

## 2012-10-23 DIAGNOSIS — H2510 Age-related nuclear cataract, unspecified eye: Secondary | ICD-10-CM | POA: Diagnosis not present

## 2012-11-06 DIAGNOSIS — Z01818 Encounter for other preprocedural examination: Secondary | ICD-10-CM | POA: Diagnosis not present

## 2012-11-14 DIAGNOSIS — N183 Chronic kidney disease, stage 3 unspecified: Secondary | ICD-10-CM | POA: Diagnosis not present

## 2012-11-15 DIAGNOSIS — R0609 Other forms of dyspnea: Secondary | ICD-10-CM | POA: Diagnosis not present

## 2012-11-15 DIAGNOSIS — R0989 Other specified symptoms and signs involving the circulatory and respiratory systems: Secondary | ICD-10-CM | POA: Diagnosis not present

## 2012-11-15 DIAGNOSIS — R05 Cough: Secondary | ICD-10-CM | POA: Diagnosis not present

## 2012-11-15 DIAGNOSIS — I509 Heart failure, unspecified: Secondary | ICD-10-CM | POA: Diagnosis not present

## 2012-11-15 DIAGNOSIS — E119 Type 2 diabetes mellitus without complications: Secondary | ICD-10-CM | POA: Diagnosis not present

## 2012-11-15 DIAGNOSIS — R059 Cough, unspecified: Secondary | ICD-10-CM | POA: Diagnosis not present

## 2012-12-04 ENCOUNTER — Encounter: Payer: Self-pay | Admitting: Internal Medicine

## 2012-12-04 DIAGNOSIS — E119 Type 2 diabetes mellitus without complications: Secondary | ICD-10-CM | POA: Diagnosis not present

## 2012-12-05 DIAGNOSIS — N183 Chronic kidney disease, stage 3 unspecified: Secondary | ICD-10-CM | POA: Diagnosis not present

## 2012-12-11 DIAGNOSIS — R0989 Other specified symptoms and signs involving the circulatory and respiratory systems: Secondary | ICD-10-CM | POA: Diagnosis not present

## 2012-12-11 DIAGNOSIS — E119 Type 2 diabetes mellitus without complications: Secondary | ICD-10-CM | POA: Diagnosis not present

## 2012-12-11 DIAGNOSIS — L299 Pruritus, unspecified: Secondary | ICD-10-CM | POA: Diagnosis not present

## 2012-12-11 DIAGNOSIS — E0789 Other specified disorders of thyroid: Secondary | ICD-10-CM | POA: Diagnosis not present

## 2012-12-11 DIAGNOSIS — R0609 Other forms of dyspnea: Secondary | ICD-10-CM | POA: Diagnosis not present

## 2012-12-18 ENCOUNTER — Ambulatory Visit (HOSPITAL_COMMUNITY)
Admission: RE | Admit: 2012-12-18 | Discharge: 2012-12-18 | Disposition: A | Discharge status: Home / Self Care | Payer: Medicare Other | Source: Ambulatory Visit | Attending: Internal Medicine | Admitting: Internal Medicine

## 2012-12-18 VITALS — BP 104/78 | HR 100 | Wt 238.0 lb

## 2012-12-18 DIAGNOSIS — I5022 Chronic systolic (congestive) heart failure: Secondary | ICD-10-CM | POA: Diagnosis not present

## 2012-12-18 DIAGNOSIS — N189 Chronic kidney disease, unspecified: Secondary | ICD-10-CM | POA: Diagnosis not present

## 2012-12-18 DIAGNOSIS — I2510 Atherosclerotic heart disease of native coronary artery without angina pectoris: Secondary | ICD-10-CM | POA: Diagnosis not present

## 2012-12-18 DIAGNOSIS — I509 Heart failure, unspecified: Secondary | ICD-10-CM | POA: Insufficient documentation

## 2012-12-18 NOTE — Patient Instructions (Addendum)
   Follow up in 4 months  Do the following things EVERYDAY: 1) Weigh yourself in the morning before breakfast. Write it down and keep it in a log. 2) Take your medicines as prescribed 3) Eat low salt foods-Limit salt (sodium) to 2000 mg per day.  4) Stay as active as you can everyday 5) Limit all fluids for the day to less than 2 liters 

## 2012-12-23 DIAGNOSIS — I2510 Atherosclerotic heart disease of native coronary artery without angina pectoris: Secondary | ICD-10-CM | POA: Insufficient documentation
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
## 2013-01-23 DIAGNOSIS — R0989 Other specified symptoms and signs involving the circulatory and respiratory systems: Secondary | ICD-10-CM | POA: Diagnosis not present

## 2013-01-23 DIAGNOSIS — R3915 Urgency of urination: Secondary | ICD-10-CM | POA: Diagnosis not present

## 2013-01-23 DIAGNOSIS — R0609 Other forms of dyspnea: Secondary | ICD-10-CM | POA: Diagnosis not present

## 2013-01-23 DIAGNOSIS — N390 Urinary tract infection, site not specified: Secondary | ICD-10-CM | POA: Diagnosis not present

## 2013-01-23 DIAGNOSIS — E119 Type 2 diabetes mellitus without complications: Secondary | ICD-10-CM | POA: Diagnosis not present

## 2013-02-28 DIAGNOSIS — N183 Chronic kidney disease, stage 3 unspecified: Secondary | ICD-10-CM | POA: Diagnosis not present

## 2013-02-28 DIAGNOSIS — I129 Hypertensive chronic kidney disease with stage 1 through stage 4 chronic kidney disease, or unspecified chronic kidney disease: Secondary | ICD-10-CM | POA: Diagnosis not present

## 2013-03-07 DIAGNOSIS — E789 Disorder of lipoprotein metabolism, unspecified: Secondary | ICD-10-CM | POA: Diagnosis not present

## 2013-03-07 DIAGNOSIS — E119 Type 2 diabetes mellitus without complications: Secondary | ICD-10-CM | POA: Diagnosis not present

## 2013-03-07 DIAGNOSIS — I10 Essential (primary) hypertension: Secondary | ICD-10-CM | POA: Diagnosis not present

## 2013-03-07 DIAGNOSIS — I509 Heart failure, unspecified: Secondary | ICD-10-CM | POA: Diagnosis not present

## 2013-03-13 DIAGNOSIS — Z23 Encounter for immunization: Secondary | ICD-10-CM | POA: Diagnosis not present

## 2013-04-05 DIAGNOSIS — E119 Type 2 diabetes mellitus without complications: Secondary | ICD-10-CM | POA: Diagnosis not present

## 2013-04-09 DIAGNOSIS — Z1231 Encounter for screening mammogram for malignant neoplasm of breast: Secondary | ICD-10-CM | POA: Diagnosis not present

## 2013-04-12 DIAGNOSIS — E0789 Other specified disorders of thyroid: Secondary | ICD-10-CM | POA: Diagnosis not present

## 2013-04-12 DIAGNOSIS — R0989 Other specified symptoms and signs involving the circulatory and respiratory systems: Secondary | ICD-10-CM | POA: Diagnosis not present

## 2013-04-12 DIAGNOSIS — I10 Essential (primary) hypertension: Secondary | ICD-10-CM | POA: Diagnosis not present

## 2013-04-12 DIAGNOSIS — R0609 Other forms of dyspnea: Secondary | ICD-10-CM | POA: Diagnosis not present

## 2013-04-12 DIAGNOSIS — M7730 Calcaneal spur, unspecified foot: Secondary | ICD-10-CM | POA: Diagnosis not present

## 2013-04-12 DIAGNOSIS — M79609 Pain in unspecified limb: Secondary | ICD-10-CM | POA: Diagnosis not present

## 2013-04-12 DIAGNOSIS — E119 Type 2 diabetes mellitus without complications: Secondary | ICD-10-CM | POA: Diagnosis not present

## 2013-05-04 DIAGNOSIS — E119 Type 2 diabetes mellitus without complications: Secondary | ICD-10-CM | POA: Diagnosis not present

## 2013-05-04 DIAGNOSIS — E789 Disorder of lipoprotein metabolism, unspecified: Secondary | ICD-10-CM | POA: Diagnosis not present

## 2013-05-04 DIAGNOSIS — E0789 Other specified disorders of thyroid: Secondary | ICD-10-CM | POA: Diagnosis not present

## 2013-05-04 DIAGNOSIS — N189 Chronic kidney disease, unspecified: Secondary | ICD-10-CM | POA: Diagnosis not present

## 2013-05-07 DIAGNOSIS — M722 Plantar fascial fibromatosis: Secondary | ICD-10-CM | POA: Diagnosis not present

## 2013-05-15 DIAGNOSIS — E789 Disorder of lipoprotein metabolism, unspecified: Secondary | ICD-10-CM | POA: Diagnosis not present

## 2013-05-15 DIAGNOSIS — E119 Type 2 diabetes mellitus without complications: Secondary | ICD-10-CM | POA: Diagnosis not present

## 2013-05-15 DIAGNOSIS — I10 Essential (primary) hypertension: Secondary | ICD-10-CM | POA: Diagnosis not present

## 2013-05-15 DIAGNOSIS — I509 Heart failure, unspecified: Secondary | ICD-10-CM | POA: Diagnosis not present

## 2013-06-01 DIAGNOSIS — R32 Unspecified urinary incontinence: Secondary | ICD-10-CM | POA: Diagnosis not present

## 2013-06-01 DIAGNOSIS — N816 Rectocele: Secondary | ICD-10-CM | POA: Diagnosis not present

## 2013-06-01 DIAGNOSIS — R300 Dysuria: Secondary | ICD-10-CM | POA: Diagnosis not present

## 2013-06-05 DIAGNOSIS — E119 Type 2 diabetes mellitus without complications: Secondary | ICD-10-CM | POA: Diagnosis not present

## 2013-06-05 DIAGNOSIS — E789 Disorder of lipoprotein metabolism, unspecified: Secondary | ICD-10-CM | POA: Diagnosis not present

## 2013-06-05 DIAGNOSIS — L0291 Cutaneous abscess, unspecified: Secondary | ICD-10-CM | POA: Diagnosis not present

## 2013-06-05 DIAGNOSIS — L0390 Cellulitis, unspecified: Secondary | ICD-10-CM | POA: Diagnosis not present

## 2013-07-17 DIAGNOSIS — E789 Disorder of lipoprotein metabolism, unspecified: Secondary | ICD-10-CM | POA: Diagnosis not present

## 2013-07-17 DIAGNOSIS — E119 Type 2 diabetes mellitus without complications: Secondary | ICD-10-CM | POA: Diagnosis not present

## 2013-07-17 DIAGNOSIS — E0789 Other specified disorders of thyroid: Secondary | ICD-10-CM | POA: Diagnosis not present

## 2013-07-17 DIAGNOSIS — I509 Heart failure, unspecified: Secondary | ICD-10-CM | POA: Diagnosis not present

## 2013-07-18 ENCOUNTER — Ambulatory Visit (HOSPITAL_COMMUNITY)
Admission: RE | Admit: 2013-07-18 | Discharge: 2013-07-18 | Disposition: A | Discharge status: Home / Self Care | Payer: Medicare Other | Source: Ambulatory Visit | Attending: Internal Medicine | Admitting: Internal Medicine

## 2013-07-18 VITALS — BP 132/72 | HR 99 | Wt 247.5 lb

## 2013-07-18 DIAGNOSIS — I5022 Chronic systolic (congestive) heart failure: Secondary | ICD-10-CM | POA: Diagnosis not present

## 2013-07-18 DIAGNOSIS — N189 Chronic kidney disease, unspecified: Secondary | ICD-10-CM | POA: Insufficient documentation

## 2013-07-18 DIAGNOSIS — I2510 Atherosclerotic heart disease of native coronary artery without angina pectoris: Secondary | ICD-10-CM | POA: Insufficient documentation
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
## 2013-07-18 NOTE — Addendum Note (Signed)
Encounter addended by: Jolaine Artist, MD on: 07/18/2013  1:42 PM<BR>     Documentation filed: Notes Section

## 2013-07-18 NOTE — Addendum Note (Signed)
Encounter addended by: Scarlette Calico, RN on: 07/18/2013  1:45 PM<BR>     Documentation filed: Patient Instructions Section, Medications

## 2013-07-18 NOTE — Patient Instructions (Signed)
Stop Hydralazine and Isosorbide  We will contact you in 3 months to schedule your next appointment.

## 2013-07-25 DIAGNOSIS — R05 Cough: Secondary | ICD-10-CM | POA: Diagnosis not present

## 2013-07-25 DIAGNOSIS — I509 Heart failure, unspecified: Secondary | ICD-10-CM | POA: Diagnosis not present

## 2013-07-25 DIAGNOSIS — R059 Cough, unspecified: Secondary | ICD-10-CM | POA: Diagnosis not present

## 2013-07-25 DIAGNOSIS — I10 Essential (primary) hypertension: Secondary | ICD-10-CM | POA: Diagnosis not present

## 2013-07-25 DIAGNOSIS — E119 Type 2 diabetes mellitus without complications: Secondary | ICD-10-CM | POA: Diagnosis not present

## 2013-08-08 DIAGNOSIS — M1A00X0 Idiopathic chronic gout, unspecified site, without tophus (tophi): Secondary | ICD-10-CM | POA: Diagnosis not present

## 2013-08-08 DIAGNOSIS — M159 Polyosteoarthritis, unspecified: Secondary | ICD-10-CM | POA: Diagnosis not present

## 2013-08-30 ENCOUNTER — Encounter: Payer: Self-pay | Admitting: Internal Medicine

## 2013-08-30 DIAGNOSIS — R059 Cough, unspecified: Secondary | ICD-10-CM | POA: Diagnosis not present

## 2013-08-30 DIAGNOSIS — R05 Cough: Secondary | ICD-10-CM | POA: Diagnosis not present

## 2013-08-30 DIAGNOSIS — E119 Type 2 diabetes mellitus without complications: Secondary | ICD-10-CM | POA: Diagnosis not present

## 2013-08-31 DIAGNOSIS — R059 Cough, unspecified: Secondary | ICD-10-CM | POA: Diagnosis not present

## 2013-08-31 DIAGNOSIS — R05 Cough: Secondary | ICD-10-CM | POA: Diagnosis not present

## 2013-09-11 ENCOUNTER — Encounter: Payer: Self-pay | Admitting: Internal Medicine

## 2013-09-11 ENCOUNTER — Telehealth (HOSPITAL_COMMUNITY): Payer: Self-pay | Admitting: Cardiology

## 2013-09-11 DIAGNOSIS — I509 Heart failure, unspecified: Secondary | ICD-10-CM | POA: Diagnosis not present

## 2013-09-11 DIAGNOSIS — E119 Type 2 diabetes mellitus without complications: Secondary | ICD-10-CM | POA: Diagnosis not present

## 2013-09-11 DIAGNOSIS — R0609 Other forms of dyspnea: Secondary | ICD-10-CM | POA: Diagnosis not present

## 2013-09-11 DIAGNOSIS — R0989 Other specified symptoms and signs involving the circulatory and respiratory systems: Secondary | ICD-10-CM | POA: Diagnosis not present

## 2013-09-11 DIAGNOSIS — I10 Essential (primary) hypertension: Secondary | ICD-10-CM | POA: Diagnosis not present

## 2013-09-11 NOTE — Telephone Encounter (Signed)
Pt called with c/o increased edema and SOB  Coughing all night While waiting on some one to return he call pt also called Dr. Tina Griffiths office Pt will be seen at 2:00 pm today Advise pt to keep appt and call us back to schedule her follow up that is due inJune  Please advise further if neede

## 2013-09-12 ENCOUNTER — Ambulatory Visit (HOSPITAL_COMMUNITY)
Admission: RE | Admit: 2013-09-12 | Discharge: 2013-09-12 | Disposition: A | Discharge status: Home / Self Care | Payer: Medicare Other | Source: Ambulatory Visit | Attending: Internal Medicine | Admitting: Internal Medicine

## 2013-09-12 ENCOUNTER — Encounter (HOSPITAL_COMMUNITY): Payer: Self-pay

## 2013-09-12 VITALS — BP 133/64 | HR 89 | Resp 20 | Wt 252.5 lb

## 2013-09-12 DIAGNOSIS — N189 Chronic kidney disease, unspecified: Secondary | ICD-10-CM | POA: Diagnosis not present

## 2013-09-12 DIAGNOSIS — I428 Other cardiomyopathies: Secondary | ICD-10-CM | POA: Diagnosis not present

## 2013-09-12 DIAGNOSIS — I509 Heart failure, unspecified: Secondary | ICD-10-CM | POA: Diagnosis not present

## 2013-09-12 DIAGNOSIS — M2550 Pain in unspecified joint: Secondary | ICD-10-CM

## 2013-09-12 DIAGNOSIS — E669 Obesity, unspecified: Secondary | ICD-10-CM

## 2013-09-12 DIAGNOSIS — I5022 Chronic systolic (congestive) heart failure: Secondary | ICD-10-CM | POA: Diagnosis not present

## 2013-09-12 DIAGNOSIS — I2510 Atherosclerotic heart disease of native coronary artery without angina pectoris: Secondary | ICD-10-CM | POA: Diagnosis not present

## 2013-09-12 MED ORDER — ISOSORB DINITRATE-HYDRALAZINE 20-37.5 MG PO TABS: 2.0000 | ORAL_TABLET | Freq: Three times a day (TID) | ORAL | Status: DC

## 2013-09-12 NOTE — Patient Instructions (Signed)
Increase Bidil to 2 tablets three times a day  Take 2.5 mg (1/2 tablet) of metolazone for the next 2 days.  Follow up with Pulmonology about CPAP.  Call if any issues.  Follow up in 2 weeks.  Do the following things EVERYDAY: 1) Weigh yourself in the morning before breakfast. Write it down and keep it in a log. 2) Take your medicines as prescribed 3) Eat low salt foods-Limit salt (sodium) to 2000 mg per day.  4) Stay as active as you can everyday 5) Limit all fluids for the day to less than 2 liters 6)
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
## 2013-09-13 DIAGNOSIS — N183 Chronic kidney disease, stage 3 unspecified: Secondary | ICD-10-CM | POA: Diagnosis not present

## 2013-09-13 DIAGNOSIS — I10 Essential (primary) hypertension: Secondary | ICD-10-CM | POA: Diagnosis not present

## 2013-09-13 DIAGNOSIS — E8779 Other fluid overload: Secondary | ICD-10-CM | POA: Diagnosis not present

## 2013-09-19 ENCOUNTER — Institutional Professional Consult (permissible substitution): Payer: Medicare Other | Admitting: Pulmonary Disease

## 2013-09-21 ENCOUNTER — Encounter: Payer: Self-pay | Admitting: Pulmonary Disease

## 2013-09-21 ENCOUNTER — Ambulatory Visit (INDEPENDENT_AMBULATORY_CARE_PROVIDER_SITE_OTHER): Payer: Medicare Other | Admitting: Pulmonary Disease

## 2013-09-21 VITALS — BP 132/68 | HR 89 | Ht 62.5 in | Wt 256.0 lb

## 2013-09-21 DIAGNOSIS — K219 Gastro-esophageal reflux disease without esophagitis: Secondary | ICD-10-CM

## 2013-09-21 DIAGNOSIS — J449 Chronic obstructive pulmonary disease, unspecified: Secondary | ICD-10-CM | POA: Diagnosis not present

## 2013-09-21 DIAGNOSIS — R0609 Other forms of dyspnea: Secondary | ICD-10-CM | POA: Diagnosis not present

## 2013-09-21 DIAGNOSIS — I2510 Atherosclerotic heart disease of native coronary artery without angina pectoris: Secondary | ICD-10-CM | POA: Diagnosis not present

## 2013-09-21 DIAGNOSIS — I509 Heart failure, unspecified: Secondary | ICD-10-CM | POA: Diagnosis not present

## 2013-09-21 DIAGNOSIS — R0989 Other specified symptoms and signs involving the circulatory and respiratory systems: Secondary | ICD-10-CM

## 2013-09-21 DIAGNOSIS — R059 Cough, unspecified: Secondary | ICD-10-CM | POA: Insufficient documentation

## 2013-09-21 DIAGNOSIS — R05 Cough: Secondary | ICD-10-CM | POA: Diagnosis not present

## 2013-09-21 DIAGNOSIS — J398 Other specified diseases of upper respiratory tract: Secondary | ICD-10-CM | POA: Insufficient documentation

## 2013-09-21 DIAGNOSIS — R0600 Dyspnea, unspecified: Secondary | ICD-10-CM

## 2013-09-21 NOTE — Assessment & Plan Note (Signed)
Most likely due to postnasal drip and acid reflux.  Plan:  - Zyrtec, Nasacort, saline rinses - Continue PPI, add Pepcid at night, reviewed GERD lifestyle changes

## 2013-09-21 NOTE — Addendum Note (Signed)
Addended by: Len Blalock on: 09/21/2013 12:26 PM   Modules accepted: Orders

## 2013-09-21 NOTE — Patient Instructions (Signed)
We will set up a lung function test for you and a CT scan of the chest  For acid reflux, follow the information we gave you; consider taking pepcid at night in addition to your Zegerid  For the sinus congestion: Use Neil Med rinses with distilled water at least twice per day using the instructions on the package. 1/2 hour after using the [facility] Med rinse, use Nasacort two puffs in each nostril once per day.  Remember that the Nasacort can take 1-2 weeks to work after regular use. Use generic zyrtec (cetirizine) every day.  If this doesn't help, then stop taking it and use chlorpheniramine-phenylephrine combination tablets.  We will see you back in 2 weeks or sooner if needed
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
## 2013-09-21 NOTE — Assessment & Plan Note (Signed)
Reviewed lifestyle modification. Continue PPI Had nighttime H2 blockers Consider barium swallow considering mild intermittent dysphasia

## 2013-09-21 NOTE — Assessment & Plan Note (Signed)
Presumptive diagnosis. No PFT on record  Plan:  -continue Breo and Spiriva for now

## 2013-09-21 NOTE — Assessment & Plan Note (Signed)
She is a past medical history significant for COPD but she has never smoked and she tells me that she has never had breathing test. Per the differential diagnosis for her shortness of breath is broad. I think most significantly is her obesity and deconditioning followed by her congestive heart failure.  What bothers me those her most recent chest x-ray showed interstitial changes which I don't think are completely explained by congestive heart failure. It does not sound like this was taken during a time when she was particularly volume overloaded or she had a respiratory infection so I question the possibility of interstitial lung disease.  Plan:  -full pulmonary function test -Chest CT - Followup 2 weeks

## 2013-09-21 NOTE — Assessment & Plan Note (Signed)
Continue followup with Dr. Haroldine Laws

## 2013-09-24 ENCOUNTER — Inpatient Hospital Stay: Admission: RE | Admit: 2013-09-24 | Payer: Medicare Other | Source: Ambulatory Visit

## 2013-09-24 ENCOUNTER — Ambulatory Visit (HOSPITAL_COMMUNITY)
Admission: RE | Admit: 2013-09-24 | Discharge: 2013-09-24 | Disposition: A | Discharge status: Home / Self Care | Payer: Medicare Other | Source: Ambulatory Visit | Attending: Pulmonary Disease | Admitting: Pulmonary Disease

## 2013-09-24 DIAGNOSIS — R05 Cough: Secondary | ICD-10-CM | POA: Diagnosis not present

## 2013-09-24 DIAGNOSIS — R059 Cough, unspecified: Secondary | ICD-10-CM | POA: Insufficient documentation

## 2013-09-24 DIAGNOSIS — R0609 Other forms of dyspnea: Secondary | ICD-10-CM | POA: Insufficient documentation

## 2013-09-24 DIAGNOSIS — R062 Wheezing: Secondary | ICD-10-CM | POA: Insufficient documentation

## 2013-09-24 DIAGNOSIS — R0989 Other specified symptoms and signs involving the circulatory and respiratory systems: Secondary | ICD-10-CM | POA: Diagnosis not present

## 2013-09-24 DIAGNOSIS — R0600 Dyspnea, unspecified: Secondary | ICD-10-CM

## 2013-09-24 LAB — PULMONARY FUNCTION TEST
DL/VA % pred: 59 %
DL/VA: 2.7 ml/min/mmHg/L
DLCO UNC: 5.82 ml/min/mmHg
DLCO unc % pred: 27 %
FEF 25-75 POST: 2.28 L/s
FEF 25-75 Pre: 0.88 L/sec
FEF2575-%Change-Post: 157 %
FEF2575-%PRED-POST: 135 %
FEF2575-%Pred-Pre: 52 %
FEV1-%CHANGE-POST: 17 %
FEV1-%PRED-POST: 73 %
FEV1-%Pred-Pre: 63 %
FEV1-POST: 1.29 L
FEV1-Pre: 1.1 L
FEV1FVC-%CHANGE-POST: 7 %
FEV1FVC-%Pred-Pre: 98 %
FEV6-%Change-Post: 9 %
FEV6-%PRED-PRE: 65 %
FEV6-%Pred-Post: 71 %
FEV6-Post: 1.55 L
FEV6-Pre: 1.42 L
FEV6FVC-%Change-Post: 0 %
FEV6FVC-%PRED-POST: 104 %
FEV6FVC-%Pred-Pre: 103 %
FVC-%CHANGE-POST: 9 %
FVC-%Pred-Post: 69 %
FVC-%Pred-Pre: 63 %
FVC-PRE: 1.42 L
FVC-Post: 1.55 L
PRE FEV1/FVC RATIO: 77 %
PRE FEV6/FVC RATIO: 100 %
Post FEV1/FVC ratio: 83 %
Post FEV6/FVC ratio: 100 %
RV % PRED: 195 %
RV: 3.95 L
TLC % pred: 107 %
TLC: 5.1 L

## 2013-09-24 MED ORDER — ALBUTEROL SULFATE (2.5 MG/3ML) 0.083% IN NEBU
2.5000 mg | INHALATION_SOLUTION | Freq: Once | RESPIRATORY_TRACT | Status: AC
  Administered 2013-09-24: 2.5 mg via RESPIRATORY_TRACT

## 2013-09-25 ENCOUNTER — Encounter: Payer: Self-pay | Admitting: Pulmonary Disease

## 2013-09-25 ENCOUNTER — Telehealth: Payer: Self-pay | Admitting: Pulmonary Disease

## 2013-09-25 DIAGNOSIS — J449 Chronic obstructive pulmonary disease, unspecified: Secondary | ICD-10-CM
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
## 2013-09-25 NOTE — Progress Notes (Signed)
Quick Note:  lmtcb X1. Will wait to order CBC until I speak to her. ______

## 2013-09-25 NOTE — Telephone Encounter (Signed)
Notes Recorded by Len Blalock, CMA on 09/25/2013 at 12:32 PM lmtcb X1. Will wait to order CBC until I speak to her. ------ Please let her know that her PFT's showed that oxygen has a hard time getting in her blood stream from her lungs. We need to check a hemoglobin (CBC) and if that is normal we will need to do more tests like a CT scan and possibly an echocardiogram. ---   I spoke with patient about results and she verbalized understanding and had no questions. Order placed

## 2013-09-26 ENCOUNTER — Ambulatory Visit (HOSPITAL_COMMUNITY)
Admission: RE | Admit: 2013-09-26 | Discharge: 2013-09-26 | Disposition: A | Discharge status: Home / Self Care | Payer: Medicare Other | Source: Ambulatory Visit | Attending: Internal Medicine | Admitting: Internal Medicine

## 2013-09-26 ENCOUNTER — Encounter (HOSPITAL_COMMUNITY): Payer: Self-pay

## 2013-09-26 ENCOUNTER — Other Ambulatory Visit (INDEPENDENT_AMBULATORY_CARE_PROVIDER_SITE_OTHER): Payer: Medicare Other

## 2013-09-26 VITALS — BP 95/69 | HR 91 | Resp 20 | Ht 62.6 in | Wt 252.0 lb

## 2013-09-26 DIAGNOSIS — E669 Obesity, unspecified: Secondary | ICD-10-CM

## 2013-09-26 DIAGNOSIS — R0600 Dyspnea, unspecified: Secondary | ICD-10-CM

## 2013-09-26 DIAGNOSIS — I10 Essential (primary) hypertension: Secondary | ICD-10-CM

## 2013-09-26 DIAGNOSIS — I509 Heart failure, unspecified: Secondary | ICD-10-CM | POA: Insufficient documentation

## 2013-09-26 DIAGNOSIS — I5022 Chronic systolic (congestive) heart failure: Secondary | ICD-10-CM | POA: Diagnosis not present

## 2013-09-26 DIAGNOSIS — R0609 Other forms of dyspnea: Secondary | ICD-10-CM

## 2013-09-26 DIAGNOSIS — R0989 Other specified symptoms and signs involving the circulatory and respiratory systems: Secondary | ICD-10-CM

## 2013-09-26 DIAGNOSIS — I129 Hypertensive chronic kidney disease with stage 1 through stage 4 chronic kidney disease, or unspecified chronic kidney disease: Secondary | ICD-10-CM | POA: Insufficient documentation

## 2013-09-26 DIAGNOSIS — J449 Chronic obstructive pulmonary disease, unspecified: Secondary | ICD-10-CM

## 2013-09-26 DIAGNOSIS — I2510 Atherosclerotic heart disease of native coronary artery without angina pectoris: Secondary | ICD-10-CM | POA: Diagnosis not present

## 2013-09-26 DIAGNOSIS — N189 Chronic kidney disease, unspecified: Secondary | ICD-10-CM

## 2013-09-26 LAB — CBC WITH DIFFERENTIAL/PLATELET
BASOS PCT: 0.4 % (ref 0.0–3.0)
Basophils Absolute: 0 10*3/uL (ref 0.0–0.1)
EOS PCT: 2 % (ref 0.0–5.0)
Eosinophils Absolute: 0.1 10*3/uL (ref 0.0–0.7)
HEMATOCRIT: 38.3 % (ref 36.0–46.0)
Hemoglobin: 12.5 g/dL (ref 12.0–15.0)
LYMPHS ABS: 1.6 10*3/uL (ref 0.7–4.0)
Lymphocytes Relative: 27.4 % (ref 12.0–46.0)
MCHC: 32.7 g/dL (ref 30.0–36.0)
MCV: 84.1 fl (ref 78.0–100.0)
MONO ABS: 0.7 10*3/uL (ref 0.1–1.0)
Monocytes Relative: 11.3 % (ref 3.0–12.0)
Neutro Abs: 3.4 10*3/uL (ref 1.4–7.7)
Neutrophils Relative %: 58.9 % (ref 43.0–77.0)
Platelets: 220 10*3/uL (ref 150.0–400.0)
RBC: 4.55 Mil/uL (ref 3.87–5.11)
RDW: 14.9 % (ref 11.5–15.5)
WBC: 5.8 10*3/uL (ref 4.0–10.5)

## 2013-09-26 LAB — BASIC METABOLIC PANEL
BUN: 49 mg/dL — AB (ref 6–23)
CALCIUM: 7.8 mg/dL — AB (ref 8.4–10.5)
CO2: 30 mEq/L (ref 19–32)
Chloride: 94 mEq/L — ABNORMAL LOW (ref 96–112)
Creatinine, Ser: 1.97 mg/dL — ABNORMAL HIGH (ref 0.50–1.10)
GFR calc Af Amer: 29 mL/min — ABNORMAL LOW (ref >90)
GFR, EST NON AFRICAN AMERICAN: 25 mL/min — AB (ref >90)
GLUCOSE: 133 mg/dL — AB (ref 70–99)
Potassium: 3.6 mEq/L — ABNORMAL LOW (ref 3.7–5.3)
Sodium: 138 mEq/L (ref 137–147)

## 2013-09-26 MED ORDER — IVABRADINE HCL 5 MG PO TABS: 5.0000 mg | ORAL_TABLET | Freq: Two times a day (BID) | ORAL | Status: DC

## 2013-09-26 NOTE — Patient Instructions (Addendum)
Will get ECHO tomorrow morning at 9 am.  Go to pharmacy and see cost of Corlanor 5 mg twice a day  Stop Bidil and see if headaches improve.   Follow up in 1 month.  Call any issues.  Do the following things EVERYDAY: 1) Weigh yourself in the morning before breakfast. Write it down and keep it in a log. 2) Take your medicines as prescribed 3) Eat low salt foods-Limit salt (sodium) to 2000 mg per day.  4) Stay as active as you can everyday 5) Limit all fluids for the day to less than 2 liters 6)

## 2013-09-27 ENCOUNTER — Ambulatory Visit (HOSPITAL_COMMUNITY)
Admission: RE | Admit: 2013-09-27 | Discharge: 2013-09-27 | Disposition: A | Discharge status: Home / Self Care | Payer: Medicare Other | Source: Ambulatory Visit | Attending: Internal Medicine | Admitting: Internal Medicine

## 2013-09-27 ENCOUNTER — Ambulatory Visit (INDEPENDENT_AMBULATORY_CARE_PROVIDER_SITE_OTHER)
Admission: RE | Admit: 2013-09-27 | Discharge: 2013-09-27 | Disposition: A | Discharge status: Home / Self Care | Payer: Medicare Other | Source: Ambulatory Visit | Attending: Pulmonary Disease | Admitting: Pulmonary Disease

## 2013-09-27 ENCOUNTER — Other Ambulatory Visit (HOSPITAL_COMMUNITY): Payer: Self-pay | Admitting: Internal Medicine

## 2013-09-27 ENCOUNTER — Telehealth (HOSPITAL_COMMUNITY): Payer: Self-pay

## 2013-09-27 DIAGNOSIS — R0600 Dyspnea, unspecified: Secondary | ICD-10-CM

## 2013-09-27 DIAGNOSIS — I509 Heart failure, unspecified: Secondary | ICD-10-CM

## 2013-09-27 DIAGNOSIS — R0989 Other specified symptoms and signs involving the circulatory and respiratory systems: Secondary | ICD-10-CM

## 2013-09-27 DIAGNOSIS — J398 Other specified diseases of upper respiratory tract: Secondary | ICD-10-CM | POA: Diagnosis not present

## 2013-09-27 DIAGNOSIS — R0609 Other forms of dyspnea: Secondary | ICD-10-CM | POA: Diagnosis not present

## 2013-09-27 DIAGNOSIS — J988 Other specified respiratory disorders: Secondary | ICD-10-CM | POA: Diagnosis not present

## 2013-09-27 DIAGNOSIS — I369 Nonrheumatic tricuspid valve disorder, unspecified: Secondary | ICD-10-CM | POA: Diagnosis not present

## 2013-09-27 NOTE — Progress Notes (Signed)
  Echocardiogram 2D Echocardiogram has been performed.  Alyse Low F Bazil Dhanani 09/27/2013, 11:20 AM

## 2013-09-27 NOTE — Telephone Encounter (Signed)
Lab results reviewed with patient, instructed to take extra 20 meq potassium today.  Aware and agreeable.

## 2013-09-27 NOTE — Progress Notes (Signed)
Quick Note:  lmtcb X1 ______ 

## 2013-09-27 NOTE — Addendum Note (Signed)
Encounter addended by: Georgina Peer, [place] on: 09/27/2013  8:13 AM<BR>     Documentation filed: Inpatient Document Flowsheet

## 2013-09-28 ENCOUNTER — Telehealth: Payer: Self-pay

## 2013-09-28 ENCOUNTER — Telehealth (HOSPITAL_COMMUNITY): Payer: Self-pay | Admitting: Anesthesiology

## 2013-09-28 ENCOUNTER — Encounter: Payer: Self-pay | Admitting: Pulmonary Disease

## 2013-09-28 DIAGNOSIS — R911 Solitary pulmonary nodule: Secondary | ICD-10-CM

## 2013-09-28 MED ORDER — IVABRADINE HCL 5 MG PO TABS: 2.5000 mg | ORAL_TABLET | Freq: Two times a day (BID) | ORAL | Status: DC

## 2013-09-28 MED ORDER — SPIRONOLACTONE 25 MG PO TABS: 25.0000 mg | ORAL_TABLET | Freq: Once | ORAL | Status: DC

## 2013-09-28 NOTE — Telephone Encounter (Signed)
Message copied by Len Blalock on Fri Sep 28, 2013  9:35 AM ------      Message from: Juanito Doom      Created: Fri Sep 28, 2013  9:17 AM       A,            Please let her know that her chest CT showed that she has narrow airways which are making her short of breath and she has a pulmonary nodule.  Because of her smoking history we need to get a repeat CT chest in 6 months.  Can you order that?            Thanks      B ------

## 2013-09-28 NOTE — Addendum Note (Signed)
Encounter addended by: [name], NP on: 09/28/2013 12:50 PM<BR>     Documentation filed: Notes Section

## 2013-09-28 NOTE — Progress Notes (Signed)
Quick Note:  Spoke with pt, she is aware. Nothing further needed. ______

## 2013-09-28 NOTE — Telephone Encounter (Signed)
Reviewed ECHO results and EF remain 20-25%, peak PA pressure severely increased 93. WE discussed possibly scheduling for RHC.  Discussed with Dr. Aundra Dubin and CrCl by [place] gault 52.56 mL/min. Will start corlanor 2.5 mg BID. Patient aware and will come back to clinic in 2 weeks. Decrease spiro to 25 mg daily.

## 2013-09-28 NOTE — Telephone Encounter (Signed)
Pt is aware of results and recs.  CT ordered in 6 months.  Nothing further needed.

## 2013-10-09 ENCOUNTER — Telehealth (HOSPITAL_COMMUNITY): Payer: Self-pay

## 2013-10-11 ENCOUNTER — Encounter: Payer: Self-pay | Admitting: Pulmonary Disease

## 2013-10-11 ENCOUNTER — Encounter (HOSPITAL_COMMUNITY): Payer: Medicare Other

## 2013-10-11 ENCOUNTER — Ambulatory Visit (INDEPENDENT_AMBULATORY_CARE_PROVIDER_SITE_OTHER): Payer: Medicare Other | Admitting: Pulmonary Disease

## 2013-10-11 ENCOUNTER — Ambulatory Visit: Payer: Medicare Other

## 2013-10-11 VITALS — BP 134/76 | HR 89 | Ht 62.5 in | Wt 247.0 lb

## 2013-10-11 DIAGNOSIS — R0609 Other forms of dyspnea: Secondary | ICD-10-CM

## 2013-10-11 DIAGNOSIS — R0600 Dyspnea, unspecified: Secondary | ICD-10-CM

## 2013-10-11 DIAGNOSIS — I2510 Atherosclerotic heart disease of native coronary artery without angina pectoris: Secondary | ICD-10-CM

## 2013-10-11 DIAGNOSIS — J398 Other specified diseases of upper respiratory tract: Secondary | ICD-10-CM | POA: Diagnosis not present

## 2013-10-11 DIAGNOSIS — R0989 Other specified symptoms and signs involving the circulatory and respiratory systems: Secondary | ICD-10-CM

## 2013-10-11 DIAGNOSIS — J988 Other specified respiratory disorders: Secondary | ICD-10-CM

## 2013-10-11 DIAGNOSIS — G4733 Obstructive sleep apnea (adult) (pediatric): Secondary | ICD-10-CM | POA: Diagnosis not present

## 2013-10-11 DIAGNOSIS — R911 Solitary pulmonary nodule: Secondary | ICD-10-CM

## 2013-10-11 MED ORDER — ZOLPIDEM TARTRATE 10 MG PO TABS: ORAL_TABLET | ORAL | Status: DC

## 2013-10-11 NOTE — Assessment & Plan Note (Signed)
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 There is no other evidence of parenchymal lung disease other than some small airways disease. Tracheomalacia can be associated with obesity and connective tissue diseases such as relapsing polychondritis. She tells me that last year she did have a strange episode where she had significant ear redness and bleeding which was never fully understood. She has not specifically had nose or other joint problems.  Plan: -Check ANA, serum ANCA, sedimentation rate and CRP -Set up sleep study for CPAP (to act as a pneumatic stent)

## 2013-10-11 NOTE — Assessment & Plan Note (Addendum)
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  My best guess is that this is related to her prior cigarette use (small airways disease) and obesity (tracheomalacia).  Plan: -Continue Breo

## 2013-10-11 NOTE — Assessment & Plan Note (Signed)
This is currently untreated. She is sleepy today in clinic and has chronic daytime fatigue. She has classic symptoms. Apparently she has had a positive polysomnogram several years ago. She has gained weight since then.  Plan:  -split-night study

## 2013-10-11 NOTE — Patient Instructions (Signed)
We will order a sleep study We will call you with the results of today's blood work We will see you back in 6-8 weeks

## 2013-10-11 NOTE — Assessment & Plan Note (Signed)
She will need a repeat CT chest in 6 months.
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
## 2013-10-12 LAB — ANCA TITERS: C-ANCA: 1:80 {titer} — ABNORMAL HIGH

## 2013-10-12 LAB — ANCA SCREEN W REFLEX TITER
ATYPICAL P-ANCA SCREEN: NEGATIVE
c-ANCA Screen: POSITIVE — AB
p-ANCA Screen: NEGATIVE

## 2013-10-15 ENCOUNTER — Ambulatory Visit
Admission: RE | Admit: 2013-10-15 | Discharge: 2013-10-15 | Disposition: A | Discharge status: Home / Self Care | Payer: Medicare Other | Source: Ambulatory Visit | Attending: Pulmonary Disease | Admitting: Pulmonary Disease

## 2013-10-15 DIAGNOSIS — R911 Solitary pulmonary nodule: Secondary | ICD-10-CM

## 2013-10-18 DIAGNOSIS — M159 Polyosteoarthritis, unspecified: Secondary | ICD-10-CM | POA: Diagnosis not present

## 2013-10-18 DIAGNOSIS — M1A00X0 Idiopathic chronic gout, unspecified site, without tophus (tophi): Secondary | ICD-10-CM | POA: Diagnosis not present

## 2013-10-18 DIAGNOSIS — M25569 Pain in unspecified knee: Secondary | ICD-10-CM | POA: Diagnosis not present

## 2013-10-23 ENCOUNTER — Telehealth: Payer: Self-pay

## 2013-10-23 DIAGNOSIS — N183 Chronic kidney disease, stage 3 unspecified: Secondary | ICD-10-CM | POA: Diagnosis not present

## 2013-10-23 DIAGNOSIS — E8779 Other fluid overload: Secondary | ICD-10-CM | POA: Diagnosis not present

## 2013-10-23 DIAGNOSIS — I10 Essential (primary) hypertension: Secondary | ICD-10-CM | POA: Diagnosis not present

## 2013-10-23 DIAGNOSIS — J398 Other specified diseases of upper respiratory tract: Secondary | ICD-10-CM

## 2013-10-23 NOTE — Telephone Encounter (Signed)
Message copied by Len Blalock on Tue Oct 23, 2013 11:44 AM ------      Message from: Simonne Maffucci B      Created: Mon Oct 22, 2013  9:16 PM       A,            Please let her know that her lab work was abnormal and she needs to have a test called a pr-3 antibody.            Please order that test so we can see that result before the next clinic.            Thanks      B ------

## 2013-10-23 NOTE — Telephone Encounter (Signed)
lmtcb X1 to relay results and recs.  Future lab has been placed already, pt just needs to know to come have it drawn.

## 2013-10-24 NOTE — Telephone Encounter (Signed)
Spoke with pt, she is aware of results and recs.  Lab is placed and verified rov on 8/11.  Nothing further needed at this time.

## 2013-10-24 NOTE — Telephone Encounter (Signed)
Pt returned Ashley's call.

## 2013-10-25 ENCOUNTER — Encounter (HOSPITAL_COMMUNITY): Payer: Medicare Other

## 2013-10-25 ENCOUNTER — Other Ambulatory Visit: Payer: Self-pay | Admitting: *Deleted

## 2013-10-25 ENCOUNTER — Encounter (HOSPITAL_COMMUNITY): Payer: Self-pay | Admitting: *Deleted

## 2013-10-25 ENCOUNTER — Ambulatory Visit (HOSPITAL_COMMUNITY)
Admission: RE | Admit: 2013-10-25 | Discharge: 2013-10-25 | Disposition: A | Discharge status: Home / Self Care | Payer: Medicare Other | Source: Ambulatory Visit | Attending: Internal Medicine | Admitting: Internal Medicine

## 2013-10-25 VITALS — BP 124/76 | HR 78 | Wt 254.2 lb

## 2013-10-25 DIAGNOSIS — I272 Pulmonary hypertension, unspecified: Secondary | ICD-10-CM

## 2013-10-25 DIAGNOSIS — I509 Heart failure, unspecified: Secondary | ICD-10-CM | POA: Insufficient documentation

## 2013-10-25 DIAGNOSIS — N189 Chronic kidney disease, unspecified: Secondary | ICD-10-CM | POA: Insufficient documentation

## 2013-10-25 DIAGNOSIS — I428 Other cardiomyopathies: Secondary | ICD-10-CM | POA: Diagnosis not present

## 2013-10-25 DIAGNOSIS — I5022 Chronic systolic (congestive) heart failure: Secondary | ICD-10-CM | POA: Insufficient documentation

## 2013-10-25 DIAGNOSIS — I252 Old myocardial infarction: Secondary | ICD-10-CM | POA: Insufficient documentation

## 2013-10-25 DIAGNOSIS — I2510 Atherosclerotic heart disease of native coronary artery without angina pectoris: Secondary | ICD-10-CM | POA: Insufficient documentation

## 2013-10-25 DIAGNOSIS — J9610 Chronic respiratory failure, unspecified whether with hypoxia or hypercapnia: Secondary | ICD-10-CM | POA: Insufficient documentation

## 2013-10-25 DIAGNOSIS — E669 Obesity, unspecified: Secondary | ICD-10-CM | POA: Diagnosis not present

## 2013-10-25 DIAGNOSIS — I129 Hypertensive chronic kidney disease with stage 1 through stage 4 chronic kidney disease, or unspecified chronic kidney disease: Secondary | ICD-10-CM | POA: Diagnosis not present

## 2013-10-25 DIAGNOSIS — K219 Gastro-esophageal reflux disease without esophagitis: Secondary | ICD-10-CM | POA: Insufficient documentation

## 2013-10-25 DIAGNOSIS — E119 Type 2 diabetes mellitus without complications: Secondary | ICD-10-CM | POA: Insufficient documentation

## 2013-10-25 DIAGNOSIS — I2789 Other specified pulmonary heart diseases: Secondary | ICD-10-CM | POA: Diagnosis not present

## 2013-10-25 DIAGNOSIS — G4733 Obstructive sleep apnea (adult) (pediatric): Secondary | ICD-10-CM | POA: Diagnosis not present

## 2013-10-25 DIAGNOSIS — I6990 Unspecified sequelae of unspecified cerebrovascular disease: Secondary | ICD-10-CM | POA: Insufficient documentation

## 2013-10-25 DIAGNOSIS — Z7982 Long term (current) use of aspirin: Secondary | ICD-10-CM | POA: Insufficient documentation

## 2013-10-25 MED ORDER — IVABRADINE HCL 5 MG PO TABS: 5.0000 mg | ORAL_TABLET | Freq: Two times a day (BID) | ORAL | Status: DC

## 2013-10-25 NOTE — Patient Instructions (Signed)
Increase Corlanor to 5 mg Twice daily   You are scheduled for a heart catheterization on Thur 11/01/13, see instruction sheet  Your physician recommends that you schedule a follow-up appointment in: 1 month
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
## 2013-10-28 DIAGNOSIS — I272 Pulmonary hypertension, unspecified: Secondary | ICD-10-CM | POA: Insufficient documentation

## 2013-10-29 ENCOUNTER — Other Ambulatory Visit: Payer: Medicare Other

## 2013-10-29 ENCOUNTER — Encounter (HOSPITAL_COMMUNITY): Payer: Self-pay | Admitting: Pharmacy Technician

## 2013-10-29 ENCOUNTER — Telehealth (HOSPITAL_COMMUNITY): Payer: Self-pay | Admitting: Cardiology

## 2013-10-29 DIAGNOSIS — J398 Other specified diseases of upper respiratory tract: Secondary | ICD-10-CM

## 2013-10-29 NOTE — Telephone Encounter (Signed)
Pt scheduled for RHC on 11/01/13 Cpt 93453 With pts current insurance no pre cert req'd

## 2013-10-30 LAB — PROTEINASE-3 AUTO ABS: Serine Protease 3: <1

## 2013-11-01 ENCOUNTER — Encounter (HOSPITAL_COMMUNITY): Payer: Self-pay

## 2013-11-01 ENCOUNTER — Encounter (HOSPITAL_COMMUNITY)
Admission: RE | Disposition: A | Discharge status: Home / Self Care | Payer: Self-pay | Source: Ambulatory Visit | Attending: Internal Medicine

## 2013-11-01 ENCOUNTER — Inpatient Hospital Stay (HOSPITAL_COMMUNITY): Payer: Medicare Other

## 2013-11-01 ENCOUNTER — Inpatient Hospital Stay (HOSPITAL_COMMUNITY)
Admission: RE | Admit: 2013-11-01 | Discharge: 2013-11-06 | DRG: 314 | Disposition: A | Discharge status: Home / Self Care | Payer: Medicare Other | Source: Ambulatory Visit | Attending: Internal Medicine | Admitting: Internal Medicine

## 2013-11-01 DIAGNOSIS — Z6841 Body Mass Index (BMI) 40.0 and over, adult: Secondary | ICD-10-CM

## 2013-11-01 DIAGNOSIS — I5023 Acute on chronic systolic (congestive) heart failure: Secondary | ICD-10-CM | POA: Diagnosis present

## 2013-11-01 DIAGNOSIS — I252 Old myocardial infarction: Secondary | ICD-10-CM | POA: Diagnosis not present

## 2013-11-01 DIAGNOSIS — J988 Other specified respiratory disorders: Secondary | ICD-10-CM

## 2013-11-01 DIAGNOSIS — E1149 Type 2 diabetes mellitus with other diabetic neurological complication: Secondary | ICD-10-CM | POA: Diagnosis present

## 2013-11-01 DIAGNOSIS — I2789 Other specified pulmonary heart diseases: Secondary | ICD-10-CM | POA: Diagnosis not present

## 2013-11-01 DIAGNOSIS — G589 Mononeuropathy, unspecified: Secondary | ICD-10-CM | POA: Diagnosis present

## 2013-11-01 DIAGNOSIS — I2510 Atherosclerotic heart disease of native coronary artery without angina pectoris: Secondary | ICD-10-CM | POA: Diagnosis not present

## 2013-11-01 DIAGNOSIS — K219 Gastro-esophageal reflux disease without esophagitis: Secondary | ICD-10-CM | POA: Diagnosis present

## 2013-11-01 DIAGNOSIS — Z7982 Long term (current) use of aspirin: Secondary | ICD-10-CM | POA: Diagnosis not present

## 2013-11-01 DIAGNOSIS — Z79899 Other long term (current) drug therapy: Secondary | ICD-10-CM | POA: Diagnosis not present

## 2013-11-01 DIAGNOSIS — Z8673 Personal history of transient ischemic attack (TIA), and cerebral infarction without residual deficits: Secondary | ICD-10-CM | POA: Diagnosis not present

## 2013-11-01 DIAGNOSIS — I5043 Acute on chronic combined systolic (congestive) and diastolic (congestive) heart failure: Secondary | ICD-10-CM

## 2013-11-01 DIAGNOSIS — J9610 Chronic respiratory failure, unspecified whether with hypoxia or hypercapnia: Secondary | ICD-10-CM | POA: Diagnosis present

## 2013-11-01 DIAGNOSIS — Z794 Long term (current) use of insulin: Secondary | ICD-10-CM | POA: Diagnosis not present

## 2013-11-01 DIAGNOSIS — I2582 Chronic total occlusion of coronary artery: Secondary | ICD-10-CM | POA: Diagnosis present

## 2013-11-01 DIAGNOSIS — K766 Portal hypertension: Secondary | ICD-10-CM

## 2013-11-01 DIAGNOSIS — N183 Chronic kidney disease, stage 3 unspecified: Secondary | ICD-10-CM | POA: Diagnosis present

## 2013-11-01 DIAGNOSIS — E1142 Type 2 diabetes mellitus with diabetic polyneuropathy: Secondary | ICD-10-CM | POA: Diagnosis present

## 2013-11-01 DIAGNOSIS — I129 Hypertensive chronic kidney disease with stage 1 through stage 4 chronic kidney disease, or unspecified chronic kidney disease: Secondary | ICD-10-CM | POA: Diagnosis present

## 2013-11-01 DIAGNOSIS — J398 Other specified diseases of upper respiratory tract: Secondary | ICD-10-CM | POA: Diagnosis present

## 2013-11-01 DIAGNOSIS — I2721 Secondary pulmonary arterial hypertension: Secondary | ICD-10-CM | POA: Diagnosis present

## 2013-11-01 DIAGNOSIS — I509 Heart failure, unspecified: Secondary | ICD-10-CM | POA: Diagnosis present

## 2013-11-01 DIAGNOSIS — R509 Fever, unspecified: Secondary | ICD-10-CM

## 2013-11-01 DIAGNOSIS — G609 Hereditary and idiopathic neuropathy, unspecified: Secondary | ICD-10-CM | POA: Diagnosis present

## 2013-11-01 DIAGNOSIS — I428 Other cardiomyopathies: Secondary | ICD-10-CM | POA: Diagnosis present

## 2013-11-01 DIAGNOSIS — I5022 Chronic systolic (congestive) heart failure: Secondary | ICD-10-CM

## 2013-11-01 DIAGNOSIS — I272 Pulmonary hypertension, unspecified: Secondary | ICD-10-CM

## 2013-11-01 DIAGNOSIS — E876 Hypokalemia: Secondary | ICD-10-CM | POA: Diagnosis present

## 2013-11-01 DIAGNOSIS — I279 Pulmonary heart disease, unspecified: Secondary | ICD-10-CM | POA: Diagnosis not present

## 2013-11-01 HISTORY — PX: RIGHT HEART CATHETERIZATION: SHX5447

## 2013-11-01 LAB — CBC
HCT: 38.9 % (ref 36.0–46.0)
HEMATOCRIT: 36.2 % (ref 36.0–46.0)
HEMOGLOBIN: 11 g/dL — AB (ref 12.0–15.0)
Hemoglobin: 11.8 g/dL — ABNORMAL LOW (ref 12.0–15.0)
MCH: 27.2 pg (ref 26.0–34.0)
MCH: 26.8 pg (ref 26.0–34.0)
MCHC: 30.3 g/dL (ref 30.0–36.0)
MCHC: 30.4 g/dL (ref 30.0–36.0)
MCV: 89.6 fL (ref 78.0–100.0)
MCV: 88.1 fL (ref 78.0–100.0)
PLATELETS: 181 10*3/uL (ref 150–400)
Platelets: 176 10*3/uL (ref 150–400)
RBC: 4.34 MIL/uL (ref 3.87–5.11)
RBC: 4.11 MIL/uL (ref 3.87–5.11)
RDW: 14.8 % (ref 11.5–15.5)
RDW: 14.8 % (ref 11.5–15.5)
WBC: 6.3 10*3/uL (ref 4.0–10.5)
WBC: 7 10*3/uL (ref 4.0–10.5)

## 2013-11-01 LAB — POCT I-STAT 3, VENOUS BLOOD GAS (G3P V)
ACID-BASE EXCESS: 6 mmol/L — AB (ref 0.0–2.0)
Acid-Base Excess: 3 mmol/L — ABNORMAL HIGH (ref 0.0–2.0)
BICARBONATE: 34.5 meq/L — AB (ref 20.0–24.0)
Bicarbonate: 31 mEq/L — ABNORMAL HIGH (ref 20.0–24.0)
O2 SAT: 53 %
O2 SAT: 58 %
PCO2 VEN: 64.6 mmHg — AB (ref 45.0–50.0)
PO2 VEN: 33 mmHg (ref 30.0–45.0)
TCO2: 33 mmol/L (ref 0–100)
TCO2: 37 mmol/L (ref 0–100)
pCO2, Ven: 68.3 mmHg — ABNORMAL HIGH (ref 45.0–50.0)
pH, Ven: 7.29 (ref 7.250–7.300)
pH, Ven: 7.312 — ABNORMAL HIGH (ref 7.250–7.300)
pO2, Ven: 34 mmHg (ref 30.0–45.0)

## 2013-11-01 LAB — CREATININE, SERUM
Creatinine, Ser: 1.58 mg/dL — ABNORMAL HIGH (ref 0.50–1.10)
GFR calc non Af Amer: 33 mL/min — ABNORMAL LOW (ref >90)
GFR, EST AFRICAN AMERICAN: 38 mL/min — AB (ref >90)

## 2013-11-01 LAB — BASIC METABOLIC PANEL
Anion gap: 10 (ref 5–15)
BUN: 42 mg/dL — ABNORMAL HIGH (ref 6–23)
CO2: 34 mEq/L — ABNORMAL HIGH (ref 19–32)
Calcium: 7.6 mg/dL — ABNORMAL LOW (ref 8.4–10.5)
Chloride: 104 mEq/L (ref 96–112)
Creatinine, Ser: 1.68 mg/dL — ABNORMAL HIGH (ref 0.50–1.10)
GFR, EST AFRICAN AMERICAN: 36 mL/min — AB (ref >90)
GFR, EST NON AFRICAN AMERICAN: 31 mL/min — AB (ref >90)
Glucose, Bld: 84 mg/dL (ref 70–99)
POTASSIUM: 4.3 meq/L (ref 3.7–5.3)
SODIUM: 148 meq/L — AB (ref 137–147)

## 2013-11-01 LAB — GLUCOSE, CAPILLARY
Glucose-Capillary: 79 mg/dL (ref 70–99)
Glucose-Capillary: 77 mg/dL (ref 70–99)
Glucose-Capillary: 71 mg/dL (ref 70–99)
Glucose-Capillary: 210 mg/dL — ABNORMAL HIGH (ref 70–99)

## 2013-11-01 LAB — PROTIME-INR
INR: 1.15 (ref 0.00–1.49)
PROTHROMBIN TIME: 14.7 s (ref 11.6–15.2)

## 2013-11-01 LAB — MRSA PCR SCREENING: MRSA by PCR: POSITIVE — AB

## 2013-11-01 SURGERY — RIGHT HEART CATH
Anesthesia: LOCAL | Laterality: Right

## 2013-11-01 MED ORDER — FEBUXOSTAT 40 MG PO TABS
40.0000 mg | ORAL_TABLET | Freq: Every day | ORAL | Status: DC
  Administered 2013-11-01 – 2013-11-06 (×6): 40 mg via ORAL
  Filled 2013-11-01 (×6): qty 1

## 2013-11-01 MED ORDER — SODIUM CHLORIDE 0.9 % IJ SOLN: 3.0000 mL | Freq: Two times a day (BID) | INTRAMUSCULAR | Status: DC

## 2013-11-01 MED ORDER — ENOXAPARIN SODIUM 40 MG/0.4ML ~~LOC~~ SOLN
40.0000 mg | SUBCUTANEOUS | Status: DC
  Filled 2013-11-01 (×2): qty 0.4

## 2013-11-01 MED ORDER — INSULIN DETEMIR 100 UNIT/ML ~~LOC~~ SOLN
60.0000 [IU] | Freq: Every day | SUBCUTANEOUS | Status: DC
  Administered 2013-11-01 – 2013-11-05 (×4): 60 [IU] via SUBCUTANEOUS
  Filled 2013-11-01 (×7): qty 0.6

## 2013-11-01 MED ORDER — IVABRADINE HCL 5 MG PO TABS
5.0000 mg | ORAL_TABLET | Freq: Two times a day (BID) | ORAL | Status: DC
  Administered 2013-11-01: 5 mg via ORAL

## 2013-11-01 MED ORDER — ACETAMINOPHEN 325 MG PO TABS: 650.0000 mg | ORAL_TABLET | ORAL | Status: DC | PRN

## 2013-11-01 MED ORDER — SPIRONOLACTONE 25 MG PO TABS
25.0000 mg | ORAL_TABLET | Freq: Once | ORAL | Status: AC
  Administered 2013-11-01: 25 mg via ORAL
  Filled 2013-11-01: qty 1

## 2013-11-01 MED ORDER — LEVOTHYROXINE SODIUM 125 MCG PO TABS
125.0000 ug | ORAL_TABLET | Freq: Every day | ORAL | Status: DC
  Administered 2013-11-02 – 2013-11-06 (×5): 125 ug via ORAL
  Filled 2013-11-01 (×6): qty 1

## 2013-11-01 MED ORDER — MIDAZOLAM HCL 2 MG/2ML IJ SOLN
INTRAMUSCULAR | Status: AC
  Filled 2013-11-01: qty 2

## 2013-11-01 MED ORDER — SODIUM CHLORIDE 0.9 % IV SOLN: 250.0000 mL | INTRAVENOUS | Status: DC | PRN

## 2013-11-01 MED ORDER — SODIUM CHLORIDE 0.9 % IJ SOLN: 3.0000 mL | INTRAMUSCULAR | Status: DC | PRN

## 2013-11-01 MED ORDER — INSULIN ASPART 100 UNIT/ML ~~LOC~~ SOLN
0.0000 [IU] | Freq: Three times a day (TID) | SUBCUTANEOUS | Status: DC
  Administered 2013-11-02: 4 [IU] via SUBCUTANEOUS
  Administered 2013-11-02 – 2013-11-05 (×5): 3 [IU] via SUBCUTANEOUS

## 2013-11-01 MED ORDER — TIOTROPIUM BROMIDE MONOHYDRATE 18 MCG IN CAPS
1.0000 | ORAL_CAPSULE | Freq: Every day | RESPIRATORY_TRACT | Status: DC
  Administered 2013-11-02 – 2013-11-06 (×5): 18 ug via RESPIRATORY_TRACT
  Filled 2013-11-01: qty 5

## 2013-11-01 MED ORDER — ALBUTEROL SULFATE (5 MG/ML) 0.5% IN NEBU: 2.5000 mg | INHALATION_SOLUTION | Freq: Every day | RESPIRATORY_TRACT | Status: DC | PRN

## 2013-11-01 MED ORDER — FESOTERODINE FUMARATE ER 4 MG PO TB24
4.0000 mg | ORAL_TABLET | Freq: Every day | ORAL | Status: DC
Start: 2013-11-01 — End: 2013-11-06
  Administered 2013-11-01 – 2013-11-06 (×6): 4 mg via ORAL
  Filled 2013-11-01 (×7): qty 1

## 2013-11-01 MED ORDER — ONDANSETRON HCL 4 MG/2ML IJ SOLN: 4.0000 mg | Freq: Four times a day (QID) | INTRAMUSCULAR | Status: DC | PRN

## 2013-11-01 MED ORDER — ASPIRIN 81 MG PO CHEW
CHEWABLE_TABLET | ORAL | Status: AC
  Filled 2013-11-01: qty 1

## 2013-11-01 MED ORDER — HYDROCODONE-ACETAMINOPHEN 7.5-325 MG PO TABS
1.0000 | ORAL_TABLET | Freq: Four times a day (QID) | ORAL | Status: DC | PRN
  Administered 2013-11-02 – 2013-11-03 (×6): 1 via ORAL
  Filled 2013-11-01 (×6): qty 1

## 2013-11-01 MED ORDER — FUROSEMIDE 10 MG/ML IJ SOLN
80.0000 mg | Freq: Two times a day (BID) | INTRAMUSCULAR | Status: DC
  Administered 2013-11-01 – 2013-11-02 (×2): 80 mg via INTRAVENOUS
  Filled 2013-11-01 (×4): qty 8

## 2013-11-01 MED ORDER — SODIUM CHLORIDE 0.9 % IV SOLN
INTRAVENOUS | Status: DC
  Administered 2013-11-01: 12:00:00 via INTRAVENOUS

## 2013-11-01 MED ORDER — ENOXAPARIN SODIUM 40 MG/0.4ML ~~LOC~~ SOLN
40.0000 mg | SUBCUTANEOUS | Status: DC
  Filled 2013-11-01: qty 0.4

## 2013-11-01 MED ORDER — ASPIRIN EC 81 MG PO TBEC
81.0000 mg | DELAYED_RELEASE_TABLET | Freq: Every day | ORAL | Status: DC
  Administered 2013-11-02 – 2013-11-06 (×5): 81 mg via ORAL
  Filled 2013-11-01 (×6): qty 1

## 2013-11-01 MED ORDER — ASPIRIN 81 MG PO CHEW
81.0000 mg | CHEWABLE_TABLET | ORAL | Status: AC
  Administered 2013-11-01: 81 mg via ORAL

## 2013-11-01 MED ORDER — INSULIN ASPART 100 UNIT/ML ~~LOC~~ SOLN
0.0000 [IU] | Freq: Every day | SUBCUTANEOUS | Status: DC
  Administered 2013-11-01: 2 [IU] via SUBCUTANEOUS

## 2013-11-01 MED ORDER — PREGABALIN 75 MG PO CAPS
75.0000 mg | ORAL_CAPSULE | Freq: Every day | ORAL | Status: DC
  Administered 2013-11-01 – 2013-11-06 (×6): 75 mg via ORAL
  Filled 2013-11-01: qty 3
  Filled 2013-11-01 (×2): qty 1
  Filled 2013-11-01 (×3): qty 3

## 2013-11-01 MED ORDER — FENTANYL CITRATE 0.05 MG/ML IJ SOLN
INTRAMUSCULAR | Status: AC
  Filled 2013-11-01: qty 2

## 2013-11-01 MED ORDER — ALBUTEROL SULFATE (2.5 MG/3ML) 0.083% IN NEBU: 2.5000 mg | INHALATION_SOLUTION | Freq: Every day | RESPIRATORY_TRACT | Status: DC | PRN

## 2013-11-01 MED ORDER — SILDENAFIL CITRATE 20 MG PO TABS
20.0000 mg | ORAL_TABLET | Freq: Three times a day (TID) | ORAL | Status: DC
  Administered 2013-11-01 – 2013-11-02 (×2): 20 mg via ORAL
  Filled 2013-11-01 (×5): qty 1

## 2013-11-01 MED ORDER — CARVEDILOL 25 MG PO TABS
25.0000 mg | ORAL_TABLET | Freq: Two times a day (BID) | ORAL | Status: DC
  Administered 2013-11-01 – 2013-11-03 (×4): 25 mg via ORAL
  Filled 2013-11-01 (×6): qty 1

## 2013-11-01 MED ORDER — SODIUM CHLORIDE 0.9 % IJ SOLN
3.0000 mL | Freq: Two times a day (BID) | INTRAMUSCULAR | Status: DC
  Administered 2013-11-01 – 2013-11-05 (×7): 3 mL via INTRAVENOUS

## 2013-11-01 MED ORDER — ENOXAPARIN SODIUM 40 MG/0.4ML ~~LOC~~ SOLN: 40.0000 mg | SUBCUTANEOUS | Status: DC

## 2013-11-01 MED ORDER — FLUTICASONE FUROATE-VILANTEROL 100-25 MCG/INH IN AEPB
1.0000 | INHALATION_SPRAY | Freq: Every day | RESPIRATORY_TRACT | Status: DC
  Administered 2013-11-03 – 2013-11-06 (×4): 1 via RESPIRATORY_TRACT

## 2013-11-01 MED ORDER — LIDOCAINE HCL (PF) 1 % IJ SOLN
INTRAMUSCULAR | Status: AC
  Filled 2013-11-01: qty 30

## 2013-11-01 MED ORDER — IVABRADINE HCL 5 MG PO TABS
2.5000 mg | ORAL_TABLET | Freq: Two times a day (BID) | ORAL | Status: DC
  Administered 2013-11-01 – 2013-11-06 (×10): 2.5 mg via ORAL
  Filled 2013-11-01 (×3): qty 0.5

## 2013-11-01 MED ORDER — INSULIN DETEMIR 100 UNIT/ML ~~LOC~~ SOLN
80.0000 [IU] | Freq: Every day | SUBCUTANEOUS | Status: DC
  Administered 2013-11-02 – 2013-11-06 (×5): 80 [IU] via SUBCUTANEOUS
  Filled 2013-11-01 (×5): qty 0.8

## 2013-11-01 MED ORDER — VANCOMYCIN HCL IN DEXTROSE 1-5 GM/200ML-% IV SOLN
1000.0000 mg | Freq: Once | INTRAVENOUS | Status: AC
  Administered 2013-11-01: 1000 mg via INTRAVENOUS
  Filled 2013-11-01: qty 200

## 2013-11-01 MED ORDER — HEPARIN (PORCINE) IN NACL 2-0.9 UNIT/ML-% IJ SOLN
INTRAMUSCULAR | Status: AC
  Filled 2013-11-01: qty 500

## 2013-11-01 MED ORDER — CLOPIDOGREL BISULFATE 75 MG PO TABS
75.0000 mg | ORAL_TABLET | Freq: Every day | ORAL | Status: DC
  Administered 2013-11-01 – 2013-11-06 (×6): 75 mg via ORAL
  Filled 2013-11-01 (×7): qty 1

## 2013-11-01 MED ORDER — ASPIRIN 81 MG PO TABS: 81.0000 mg | ORAL_TABLET | Freq: Every day | ORAL | Status: DC

## 2013-11-01 MED ORDER — LIRAGLUTIDE 18 MG/3ML ~~LOC~~ SOPN
1.8000 mg | PEN_INJECTOR | Freq: Every day | SUBCUTANEOUS | Status: DC
  Administered 2013-11-01 – 2013-11-05 (×5): 1.8 mg via SUBCUTANEOUS

## 2013-11-01 MED ORDER — METOLAZONE 2.5 MG PO TABS
2.5000 mg | ORAL_TABLET | Freq: Every day | ORAL | Status: DC
  Administered 2013-11-01 – 2013-11-02 (×2): 2.5 mg via ORAL
  Filled 2013-11-01 (×2): qty 1

## 2013-11-01 MED ORDER — POTASSIUM CHLORIDE CRYS ER 20 MEQ PO TBCR
20.0000 meq | EXTENDED_RELEASE_TABLET | Freq: Two times a day (BID) | ORAL | Status: DC
  Administered 2013-11-01 – 2013-11-02 (×3): 20 meq via ORAL
  Filled 2013-11-01 (×4): qty 1

## 2013-11-01 NOTE — Progress Notes (Signed)
Lab results called to Jennifer ONeal,RN °
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
## 2013-11-01 NOTE — CV Procedure (Addendum)
Cardiac Cath Procedure Note:  Indication:  Heart failure. Pulmonary HTN.   Procedures performed:  1) Right heart catheterization  Description of procedure:   The risks and indication of the procedure were explained. Consent was signed and placed on the chart. An appropriate timeout was taken prior to the procedure. The right neck was prepped and draped in the routine sterile fashion and anesthetized with 1% local lidocaine.   A 7 FR venous sheath was placed in the right internal jugular vein using a modified Seldinger technique. A standard Swan-Ganz catheter was used for the procedure.   Complications: None apparent.  Findings:  RA = 23 RV = 108/8/27 PA =  102/47 (66) PCW = 30 Fick cardiac output/index = 4.2/2.0 Them CO/CI = 3.4/1.6 PVR = 10.6 FA sat = 98% PA sat = 53%, 58%  Assessment: 1. Markedly elevated biventricular pressures with severe PAH  Plan/Discussion:  PAH out of proportion to left-sided filling pressures. Will admit. Keep swan in. Diurese. Start revatio. Optimize oxygenation. Would avoid Flolan with LV dysfunction. Given cardiac output would not start milrinone at this point.   [name],MD 1:47 PM

## 2013-11-01 NOTE — Progress Notes (Signed)
Utilization Review Completed.Donne Anon T7/12/2013

## 2013-11-02 ENCOUNTER — Inpatient Hospital Stay (HOSPITAL_COMMUNITY): Payer: Medicare Other

## 2013-11-02 ENCOUNTER — Encounter (HOSPITAL_COMMUNITY)
Admission: RE | Disposition: A | Discharge status: Home / Self Care | Payer: Self-pay | Source: Ambulatory Visit | Attending: Internal Medicine

## 2013-11-02 DIAGNOSIS — I509 Heart failure, unspecified: Secondary | ICD-10-CM | POA: Diagnosis not present

## 2013-11-02 DIAGNOSIS — J398 Other specified diseases of upper respiratory tract: Secondary | ICD-10-CM

## 2013-11-02 DIAGNOSIS — I2789 Other specified pulmonary heart diseases: Principal | ICD-10-CM

## 2013-11-02 DIAGNOSIS — I279 Pulmonary heart disease, unspecified: Secondary | ICD-10-CM

## 2013-11-02 DIAGNOSIS — J988 Other specified respiratory disorders: Secondary | ICD-10-CM

## 2013-11-02 DIAGNOSIS — I5043 Acute on chronic combined systolic (congestive) and diastolic (congestive) heart failure: Secondary | ICD-10-CM

## 2013-11-02 DIAGNOSIS — I5022 Chronic systolic (congestive) heart failure: Secondary | ICD-10-CM

## 2013-11-02 HISTORY — PX: RIGHT HEART CATHETERIZATION: SHX5447

## 2013-11-02 LAB — BASIC METABOLIC PANEL
Anion gap: 13 (ref 5–15)
BUN: 33 mg/dL — ABNORMAL HIGH (ref 6–23)
CALCIUM: 8 mg/dL — AB (ref 8.4–10.5)
CO2: 33 mEq/L — ABNORMAL HIGH (ref 19–32)
CREATININE: 1.46 mg/dL — AB (ref 0.50–1.10)
Chloride: 99 mEq/L (ref 96–112)
GFR calc Af Amer: 42 mL/min — ABNORMAL LOW (ref >90)
GFR calc non Af Amer: 36 mL/min — ABNORMAL LOW (ref >90)
GLUCOSE: 85 mg/dL (ref 70–99)
Potassium: 4 mEq/L (ref 3.7–5.3)
SODIUM: 145 meq/L (ref 137–147)

## 2013-11-02 LAB — GLUCOSE, CAPILLARY
GLUCOSE-CAPILLARY: 96 mg/dL (ref 70–99)
Glucose-Capillary: 142 mg/dL — ABNORMAL HIGH (ref 70–99)
Glucose-Capillary: 164 mg/dL — ABNORMAL HIGH (ref 70–99)
Glucose-Capillary: 152 mg/dL — ABNORMAL HIGH (ref 70–99)

## 2013-11-02 LAB — POCT I-STAT 3, VENOUS BLOOD GAS (G3P V)
Acid-Base Excess: 7 mmol/L — ABNORMAL HIGH (ref 0.0–2.0)
Bicarbonate: 32.3 mEq/L — ABNORMAL HIGH (ref 20.0–24.0)
O2 Saturation: 51 %
PCO2 VEN: 49.3 mmHg (ref 45.0–50.0)
PH VEN: 7.425 — AB (ref 7.250–7.300)
TCO2: 34 mmol/L (ref 0–100)
pO2, Ven: 27 mmHg — CL (ref 30.0–45.0)

## 2013-11-02 LAB — HEMOGLOBIN A1C
Hgb A1c MFr Bld: 8.1 % — ABNORMAL HIGH (ref <5.7)
Mean Plasma Glucose: 186 mg/dL — ABNORMAL HIGH (ref <117)

## 2013-11-02 SURGERY — RIGHT HEART CATH
Anesthesia: LOCAL | Laterality: Right

## 2013-11-02 MED ORDER — LIDOCAINE HCL (PF) 1 % IJ SOLN
INTRAMUSCULAR | Status: AC
  Filled 2013-11-02: qty 30

## 2013-11-02 MED ORDER — BIOTENE DRY MOUTH MT LIQD
15.0000 mL | Freq: Two times a day (BID) | OROMUCOSAL | Status: DC
  Administered 2013-11-02 – 2013-11-05 (×8): 15 mL via OROMUCOSAL

## 2013-11-02 MED ORDER — ONDANSETRON HCL 4 MG/2ML IJ SOLN: 4.0000 mg | Freq: Four times a day (QID) | INTRAMUSCULAR | Status: DC | PRN

## 2013-11-02 MED ORDER — SODIUM CHLORIDE 0.9 % IJ SOLN
3.0000 mL | Freq: Two times a day (BID) | INTRAMUSCULAR | Status: DC
  Administered 2013-11-02 – 2013-11-05 (×6): 3 mL via INTRAVENOUS

## 2013-11-02 MED ORDER — HYDROXYZINE HCL 25 MG PO TABS
25.0000 mg | ORAL_TABLET | Freq: Four times a day (QID) | ORAL | Status: DC | PRN
  Administered 2013-11-02: 25 mg via ORAL
  Filled 2013-11-02: qty 1

## 2013-11-02 MED ORDER — ACETAMINOPHEN 325 MG PO TABS
650.0000 mg | ORAL_TABLET | ORAL | Status: DC | PRN
  Administered 2013-11-04 – 2013-11-05 (×3): 650 mg via ORAL
  Filled 2013-11-02 (×4): qty 2

## 2013-11-02 MED ORDER — HEPARIN (PORCINE) IN NACL 2-0.9 UNIT/ML-% IJ SOLN
INTRAMUSCULAR | Status: AC
  Filled 2013-11-02: qty 500

## 2013-11-02 MED ORDER — CHLORHEXIDINE GLUCONATE CLOTH 2 % EX PADS
6.0000 | MEDICATED_PAD | Freq: Every day | CUTANEOUS | Status: AC
  Administered 2013-11-02 – 2013-11-06 (×5): 6 via TOPICAL

## 2013-11-02 MED ORDER — MUPIROCIN 2 % EX OINT
1.0000 "application " | TOPICAL_OINTMENT | Freq: Two times a day (BID) | CUTANEOUS | Status: DC
  Administered 2013-11-03 – 2013-11-06 (×8): 1 via NASAL
  Filled 2013-11-02: qty 22

## 2013-11-02 MED ORDER — TORSEMIDE 20 MG PO TABS
40.0000 mg | ORAL_TABLET | Freq: Two times a day (BID) | ORAL | Status: DC
  Administered 2013-11-03: 40 mg via ORAL
  Filled 2013-11-02 (×3): qty 2

## 2013-11-02 MED ORDER — SODIUM CHLORIDE 0.9 % IV SOLN: 250.0000 mL | INTRAVENOUS | Status: DC | PRN

## 2013-11-02 MED ORDER — ENOXAPARIN SODIUM 60 MG/0.6ML ~~LOC~~ SOLN
50.0000 mg | SUBCUTANEOUS | Status: DC
  Administered 2013-11-02 – 2013-11-05 (×4): 50 mg via SUBCUTANEOUS
  Filled 2013-11-02 (×5): qty 0.6

## 2013-11-02 MED ORDER — SILDENAFIL CITRATE 20 MG PO TABS: 40.0000 mg | ORAL_TABLET | Freq: Three times a day (TID) | ORAL | Status: DC

## 2013-11-02 MED ORDER — SODIUM CHLORIDE 0.9 % IJ SOLN: 3.0000 mL | INTRAMUSCULAR | Status: DC | PRN

## 2013-11-02 MED ORDER — BENZONATATE 100 MG PO CAPS
200.0000 mg | ORAL_CAPSULE | Freq: Three times a day (TID) | ORAL | Status: DC | PRN
  Administered 2013-11-02: 200 mg via ORAL
  Filled 2013-11-02: qty 2

## 2013-11-02 MED ORDER — SILDENAFIL CITRATE 20 MG PO TABS
40.0000 mg | ORAL_TABLET | Freq: Three times a day (TID) | ORAL | Status: DC
  Administered 2013-11-02 – 2013-11-04 (×8): 40 mg via ORAL
  Filled 2013-11-02 (×12): qty 2

## 2013-11-02 MED ORDER — HEPARIN (PORCINE) IN NACL 2-0.9 UNIT/ML-% IJ SOLN
INTRAMUSCULAR | Status: AC
  Filled 2013-11-02: qty 1500

## 2013-11-02 NOTE — Care Management (Signed)
Attn Physicians   For Revatio PRIOR Noel $ AMOUNT IS GIVEN - Please call - [phone]

## 2013-11-02 NOTE — CV Procedure (Signed)
Cardiac Cath Procedure Note:  Indication:  PAH  Procedures performed:  1) Right heart catheterization 2) Replacement of RIJ sheath and Swan Ganz catheter  Description of procedure:   The risks and indication of the procedure were explained. Consent was signed and placed on the chart. An appropriate timeout was taken prior to the procedure. The right neck - including the pre-existing 7FR sheath - was prepped and draped in the routine sterile fashion and anesthetized with 1% local lidocaine. The current sheath was replaced over a wire with an 8FR sheath.  A standard Swan-Ganz catheter was used for the procedure.   Complications: None apparent.  Findings:  RA = 7 RV = 69/0/8 PA = 68/19 (38) PCW = 14 Fick cardiac output/index = 4.7/2.2 PVR = 5.1 WU FA sat = 91% PA sat = 51%  Assessment:  Marked improvement in hemodynamics with diuresis and Revatio.   Plan/Discussion:  Will switch to oral diuretics. Continue Revatio. Hopefully can pull Swan in am.   Glori Bickers MD 3:48 PM

## 2013-11-02 NOTE — Progress Notes (Signed)
CARDIAC REHAB PHASE I   PRE:  Rate/Rhythm: 82 SR    BP: sitting 124/55    SaO2: 94 RA  MODE:  Ambulation: 190 ft   POST:  Rate/Rhythm: 92 SR    BP: sitting 132/69     SaO2: 100 RA  Pt able to ambulate with RW, assist x2 with supervision. Steady with slow pace. No c/o, pt talked entire walk. Now going to lab for swan. Will f/u when swan out. [id number]   Grace Bradford [place] CES, ACSM 11/02/2013 2:22 PM

## 2013-11-02 NOTE — Progress Notes (Signed)
Advanced Heart Failure Rounding Note   Subjective:    Grace Bradford is a 68 y/o woman with multiple medical problems. She has h/o obesity, DM2, HTN, HL and CRI. She has a history of CHF with a diagnosis of NICM from 2007. Follow up studies showed a normal EF in 2009. In March of 2010 with acute pulmonary edema. Underwent cath by Dr. Felton Clinton showing EF 40% with mild non-obstructive CAD. Unfortunately cath complicated by acute MI thought due to embolization of LV clot. Had total occlusion of ostial LCx and distal LAD. Unable to be opened. PCI c/b dissection of large ramus branch. Post-cath course c/b contrast nephropathy. Refuses ICD.   Scheduled RHC yesterday: RA = 23  RV = 108/8/27  PA = 102/47 (66)  PCW = 30  Fick cardiac output/index = 4.2/2.0  Them CO/CI = 3.4/1.6  PVR = 10.6  FA sat = 98%  PA sat = 53%, 58%  She was admitted following the procedure for severe PAH and was started on diuretics and revatio. Last night swan curled up in RV and was taken out. Weight down 7 lbs and 24 hr I/o -3.2 liters. Reports breathing is a little better. Denies CP. + cough    Objective:   Weight Range:  Vital Signs:   Temp:  [98.1 F (36.7 C)-99.6 F (37.6 C)] 99.1 F (37.3 C) (07/10 0440) Pulse Rate:  [80-83] 83 (07/09 1255) Resp:  [17-31] 22 (07/10 0600) BP: (123-161)/(60-120) 154/96 mmHg (07/10 0600) SpO2:  [93 %-100 %] 93 % (07/10 0500) Weight:  [246 lb 7.6 oz (111.8 kg)-253 lb 4.9 oz (114.9 kg)] 246 lb 7.6 oz (111.8 kg) (07/10 0500)    Weight change: Filed Weights   11/01/13 1053 11/01/13 1515 11/02/13 0500  Weight: 250 lb (113.399 kg) 253 lb 4.9 oz (114.9 kg) 246 lb 7.6 oz (111.8 kg)    Intake/Output:   Intake/Output Summary (Last 24 hours) at 11/02/13 0701 Last data filed at 11/02/13 0600  Gross per 24 hour  Intake    360 ml  Output   3550 ml  Net  -3190 ml     Physical Exam: General: Obese. Sitting up in chair. HEENT: normal  Neck: supple. JVP difficult to assess d/t body  habitus but appears elevated; Carotids 2+ bilat; no bruits. No lymphadenopathy or thryomegaly appreciated.  Cor: PMI nondisplaced. Regular rate & rhythm. No rubs, gallops or murmurs.  Lungs: clear  Abdomen: obese soft, nontender, nondistended. No bruits or masses. Good bowel sounds.  Extremities: no cyanosis, clubbing, rash, 1+ bilateral edema. Multiple excoriations  Neuro: alert & orientedx3, cranial nerves grossly intact. moves all 4 extremities w/o difficulty. Affect pleasant   Telemetry: SR 80s  Labs: Basic Metabolic Panel:  Recent Labs Lab 11/01/13 1104 11/01/13 1530 11/02/13 0445  NA 148*  --  145  K 4.3  --  4.0  CL 104  --  99  CO2 34*  --  33*  GLUCOSE 84  --  85  BUN 42*  --  33*  CREATININE 1.68* 1.58* 1.46*  CALCIUM 7.6*  --  8.0*    Liver Function Tests: No results found for this basename: AST, ALT, ALKPHOS, BILITOT, PROT, ALBUMIN,  in the last 168 hours No results found for this basename: LIPASE, AMYLASE,  in the last 168 hours No results found for this basename: AMMONIA,  in the last 168 hours  CBC:  Recent Labs Lab 11/01/13 1104 11/01/13 1530  WBC 6.3 7.0  HGB 11.8* 11.0*  HCT  38.9 36.2  MCV 89.6 88.1  PLT 181 176    Cardiac Enzymes: No results found for this basename: CKTOTAL, CKMB, CKMBINDEX, TROPONINI,  in the last 168 hours  BNP: BNP (last 3 results) No results found for this basename: PROBNP,  in the last 8760 hours   Other results:  EKG:   Imaging:  No results found.   Medications:     Scheduled Medications: . antiseptic oral rinse  15 mL Mouth Rinse BID  . aspirin EC  81 mg Oral Daily  . carvedilol  25 mg Oral BID WC  . Chlorhexidine Gluconate Cloth  6 each Topical Q0600  . clopidogrel  75 mg Oral Daily  . enoxaparin (LOVENOX) injection  40 mg Subcutaneous Q24H  . febuxostat  40 mg Oral Daily  . fesoterodine  4 mg Oral Daily  . Fluticasone Furoate-Vilanterol  1 puff Inhalation Daily  . furosemide  80 mg Intravenous BID   . insulin aspart  0-20 Units Subcutaneous TID WC  . insulin aspart  0-5 Units Subcutaneous QHS  . insulin detemir  60 Units Subcutaneous QHS  . insulin detemir  80 Units Subcutaneous Daily  . ivabradine HCl  2.5 mg Oral BID  . levothyroxine  125 mcg Oral QAC breakfast  . Liraglutide  1.8 mg Subcutaneous QHS  . metolazone  2.5 mg Oral Daily  . [START ON 11/03/2013] mupirocin ointment  1 application Nasal BID  . potassium chloride  20 mEq Oral BID  . pregabalin  75 mg Oral Daily  . sildenafil  20 mg Oral TID  . sodium chloride  3 mL Intravenous Q12H  . tiotropium  1 capsule Inhalation Daily     Infusions:     PRN Medications:  sodium chloride, acetaminophen, albuterol, HYDROcodone-acetaminophen, ondansetron (ZOFRAN) IV, sodium chloride   Assessment:   1) Severe pulmonary HTN 2) A/C systolic HF - EF 0000000 3) CAD 4) Morbid Obesity 5) DM2 - with peripheral neuropathy 6) CKD stage III 7) Chronic respiratory failure with severe tracheomalacia   Plan/Discussion:    Grace Bradford was admitted after scheduled RHC for elevated pulmonary pressures and wedge. Her swan became curled in RV and was discontinued.   Volume status improving and weight is down 7 lbs and 24 hr I/O - 3.1 liters. Renal function stable. Will continue to diurese and follow closely.   Will titrate revatio to 40 mg TID and follow closely.  SBP 140s will restart Spiro 25 mg daily.   Consult CR.   Length of Stay: 1 [name] NP-C 11/02/2013, 7:01 AM  Advanced Heart Failure Team Pager [phone] (M-F; 7a - 4p)  Please contact [facility] for night-coverage after hours (4p -7a ) and weekends on [email or web address]  Patient seen and examined with [name], NP. We discussed all aspects of the encounter. I agree with the assessment and plan as stated above.   Diuresing well. Feels a bit better. [name] was discontinued last night due to malpositioning. Agree with titrating Revatio for severe PAH. Will replace  Swan today in cath lab. Will continue diuresis. May still need to consider milrinone.   [name],MD 9:44 AM

## 2013-11-02 NOTE — Progress Notes (Addendum)
[employer] and they said patient does not need prior-auth but to just send medication to pharmacy and let them know she had no hypotension and then they would need to call them back. Called pharmacy and they said she needed prior-auth. Told pharmacy to call Help Desk and about codes claim [phone] and they said they would call.   [employer] back and life approval for sildenafil. Authorization # [id number]  [name] B NP-C 3:55 PM

## 2013-11-03 DIAGNOSIS — I2510 Atherosclerotic heart disease of native coronary artery without angina pectoris: Secondary | ICD-10-CM

## 2013-11-03 DIAGNOSIS — I2789 Other specified pulmonary heart diseases: Secondary | ICD-10-CM | POA: Diagnosis not present

## 2013-11-03 DIAGNOSIS — I5023 Acute on chronic systolic (congestive) heart failure: Secondary | ICD-10-CM

## 2013-11-03 DIAGNOSIS — R509 Fever, unspecified: Secondary | ICD-10-CM

## 2013-11-03 LAB — GLUCOSE, CAPILLARY
GLUCOSE-CAPILLARY: 107 mg/dL — AB (ref 70–99)
GLUCOSE-CAPILLARY: 125 mg/dL — AB (ref 70–99)
Glucose-Capillary: 96 mg/dL (ref 70–99)
Glucose-Capillary: 93 mg/dL (ref 70–99)

## 2013-11-03 LAB — CARBOXYHEMOGLOBIN
CARBOXYHEMOGLOBIN: 1.6 % — AB (ref 0.5–1.5)
METHEMOGLOBIN: 0.8 % (ref 0.0–1.5)
O2 Saturation: 49.1 %
Total hemoglobin: 12.2 g/dL (ref 12.0–16.0)

## 2013-11-03 LAB — BASIC METABOLIC PANEL
Anion gap: 12 (ref 5–15)
BUN: 37 mg/dL — AB (ref 6–23)
CO2: 32 meq/L (ref 19–32)
CREATININE: 1.82 mg/dL — AB (ref 0.50–1.10)
Calcium: 7.6 mg/dL — ABNORMAL LOW (ref 8.4–10.5)
Chloride: 100 mEq/L (ref 96–112)
GFR calc Af Amer: 32 mL/min — ABNORMAL LOW (ref >90)
GFR calc non Af Amer: 28 mL/min — ABNORMAL LOW (ref >90)
Glucose, Bld: 86 mg/dL (ref 70–99)
Potassium: 3.5 mEq/L — ABNORMAL LOW (ref 3.7–5.3)
Sodium: 144 mEq/L (ref 137–147)

## 2013-11-03 MED ORDER — CARVEDILOL 12.5 MG PO TABS
12.5000 mg | ORAL_TABLET | Freq: Two times a day (BID) | ORAL | Status: DC
  Administered 2013-11-03 – 2013-11-04 (×2): 12.5 mg via ORAL
  Filled 2013-11-03 (×4): qty 1

## 2013-11-03 MED ORDER — POTASSIUM CHLORIDE CRYS ER 20 MEQ PO TBCR
40.0000 meq | EXTENDED_RELEASE_TABLET | Freq: Once | ORAL | Status: AC
  Administered 2013-11-03: 40 meq via ORAL
  Filled 2013-11-03: qty 2

## 2013-11-03 MED ORDER — VANCOMYCIN HCL 10 G IV SOLR
1250.0000 mg | INTRAVENOUS | Status: DC
  Administered 2013-11-03 – 2013-11-05 (×3): 1250 mg via INTRAVENOUS
  Filled 2013-11-03 (×4): qty 1250

## 2013-11-03 MED ORDER — LOSARTAN POTASSIUM 25 MG PO TABS
25.0000 mg | ORAL_TABLET | Freq: Every day | ORAL | Status: DC
  Administered 2013-11-03: 25 mg via ORAL
  Filled 2013-11-03 (×2): qty 1

## 2013-11-03 MED ORDER — GUAIFENESIN ER 600 MG PO TB12
1200.0000 mg | ORAL_TABLET | Freq: Two times a day (BID) | ORAL | Status: DC
  Administered 2013-11-03 – 2013-11-06 (×7): 1200 mg via ORAL
  Filled 2013-11-03 (×8): qty 2

## 2013-11-03 MED ORDER — TORSEMIDE 20 MG PO TABS
60.0000 mg | ORAL_TABLET | Freq: Two times a day (BID) | ORAL | Status: DC
  Administered 2013-11-03 – 2013-11-05 (×5): 60 mg via ORAL
  Filled 2013-11-03 (×8): qty 3

## 2013-11-03 NOTE — Progress Notes (Addendum)
Advanced Heart Failure Rounding Note   Subjective:    Grace Bradford is a 68 y/o woman with multiple medical problems. She has h/o obesity, DM2, HTN, HL and CRI. She has a history of CHF with a diagnosis of NICM from 2007. Follow up studies showed a normal EF in 2009. In March of 2010 with acute pulmonary edema. Underwent cath by Dr. Felton Clinton showing EF 40% with mild non-obstructive CAD. Unfortunately cath complicated by acute MI thought due to embolization of LV clot. Had total occlusion of ostial LCx and distal LAD. Unable to be opened. PCI c/b dissection of large ramus branch. Post-cath course c/b contrast nephropathy. EF now 20-25%. Refuses ICD.   HAs not been on ACE/ARB due to CKD (Cr has ranged 1.7-2.5)  Admitted 7/9 after RHC with markedly elevated biventricular pressures with PAPs  105 and PCWP 30 and PVR 10.6 Taken back to cath lab yesterday for replacement of Swan after it was dislodged. PA pressures much better after diuresis and addition of Revatio.   RHC 7/10 RA = 7  RV = 69/0/8  PA = 68/19 (38)  PCW = 14  Fick cardiac output/index = 4.7/2.2  PVR = 5.1 WU  FA sat = 91%  PA sat = 51%  Continues to diurese. Though weight unchanged. Cr up 1.5 -> 1.8. Breathing better.  Low grade temps overnight. Now afebrile.   [name] numbers this am (done personally with nurse) CVP = 13 PA = 77/30 (45) PCW = 20 Thermo = 3.9/1.8 Co-ox 49%   Objective:   Weight Range:  Vital Signs:   Temp:  [97.8 F (36.6 C)-100 F (37.8 C)] 98.6 F (37 C) (07/11 0900) Pulse Rate:  [79-90] 83 (07/11 0400) Resp:  [15-33] 19 (07/11 0900) BP: (109-146)/(44-86) 145/67 mmHg (07/11 0800) SpO2:  [91 %-97 %] 96 % (07/11 0900) Weight:  [111.585 kg (246 lb)] 111.585 kg (246 lb) (07/11 0500) Last BM Date: 10/31/13  Weight change: Filed Weights   11/01/13 1515 11/02/13 0500 11/03/13 0500  Weight: 114.9 kg (253 lb 4.9 oz) 111.8 kg (246 lb 7.6 oz) 111.585 kg (246 lb)    Intake/Output:   Intake/Output Summary  (Last 24 hours) at 11/03/13 1036 Last data filed at 11/03/13 0900  Gross per 24 hour  Intake   2040 ml  Output   1500 ml  Net    540 ml     Physical Exam: General: Lying in bed. NAD HEENT: normal  Neck: supple. RIJ swan Cor: PMI nonpalpable Regular rate & rhythm. Prominent P2. +s3 Lungs: clear  Abdomen: obese soft, nontender, nondistended. No bruits or masses. Good bowel sounds.  Extremities: no cyanosis, clubbing, rash, tr bilateral edema. Multiple excoriations  Neuro: alert & orientedx3, cranial nerves grossly intact. moves all 4 extremities w/o difficulty. Affect pleasant   Telemetry: SR 80s  Labs: Basic Metabolic Panel:  Recent Labs Lab 11/01/13 1104 11/01/13 1530 11/02/13 0445 11/03/13 0500  NA 148*  --  145 144  K 4.3  --  4.0 3.5*  CL 104  --  99 100  CO2 34*  --  33* 32  GLUCOSE 84  --  85 86  BUN 42*  --  33* 37*  CREATININE 1.68* 1.58* 1.46* 1.82*  CALCIUM 7.6*  --  8.0* 7.6*    Liver Function Tests: No results found for this basename: AST, ALT, ALKPHOS, BILITOT, PROT, ALBUMIN,  in the last 168 hours No results found for this basename: LIPASE, AMYLASE,  in the last 168 hours  No results found for this basename: AMMONIA,  in the last 168 hours  CBC:  Recent Labs Lab 11/01/13 1104 11/01/13 1530  WBC 6.3 7.0  HGB 11.8* 11.0*  HCT 38.9 36.2  MCV 89.6 88.1  PLT 181 176    Cardiac Enzymes: No results found for this basename: CKTOTAL, CKMB, CKMBINDEX, TROPONINI,  in the last 168 hours  BNP: BNP (last 3 results) No results found for this basename: PROBNP,  in the last 8760 hours   Other results:    Imaging: Dg Chest Port 1 View  11/02/2013   CLINICAL DATA:  Cardiac failure  EXAM: PORTABLE CHEST - 1 VIEW  COMPARISON:  09/27/2013  FINDINGS: Cardiac shadow remains enlarged. The right jugular sheath is noted. Mild vascular congestion is seen without focal interstitial edema. No focal infiltrates are noted.  IMPRESSION: Cardiomegaly and mild  vascular congestion. The overall appearance is stable from the prior study.   Electronically Signed   By: Inez Catalina M.D.   On: 11/02/2013 07:20     Medications:     Scheduled Medications: . antiseptic oral rinse  15 mL Mouth Rinse BID  . aspirin EC  81 mg Oral Daily  . carvedilol  25 mg Oral BID WC  . Chlorhexidine Gluconate Cloth  6 each Topical Q0600  . clopidogrel  75 mg Oral Daily  . enoxaparin (LOVENOX) injection  50 mg Subcutaneous Q24H  . febuxostat  40 mg Oral Daily  . fesoterodine  4 mg Oral Daily  . Fluticasone Furoate-Vilanterol  1 puff Inhalation Daily  . guaiFENesin  1,200 mg Oral BID  . insulin aspart  0-20 Units Subcutaneous TID WC  . insulin aspart  0-5 Units Subcutaneous QHS  . insulin detemir  60 Units Subcutaneous QHS  . insulin detemir  80 Units Subcutaneous Daily  . ivabradine HCl  2.5 mg Oral BID  . levothyroxine  125 mcg Oral QAC breakfast  . Liraglutide  1.8 mg Subcutaneous QHS  . mupirocin ointment  1 application Nasal BID  . pregabalin  75 mg Oral Daily  . sildenafil  40 mg Oral TID  . sodium chloride  3 mL Intravenous Q12H  . sodium chloride  3 mL Intravenous Q12H  . tiotropium  1 capsule Inhalation Daily  . torsemide  40 mg Oral BID    Infusions:    PRN Medications: sodium chloride, sodium chloride, acetaminophen, albuterol, benzonatate, HYDROcodone-acetaminophen, hydrOXYzine, ondansetron (ZOFRAN) IV, sodium chloride, sodium chloride   Assessment:   1) Severe pulmonary HTN 2) A/C systolic HF - EF 0000000 3) CAD 4) Morbid Obesity 5) DM2 - with peripheral neuropathy 6) CKD stage III 7) Chronic respiratory failure with severe tracheomalacia 8) Hypokalemia 9) Fever  Plan/Discussion:    Difficult situation. Hemodynamics improved with diuresis and sildenafil however outputs are very marginal. We have not had her on ACE/ARB due to CKD. With markedly elevated PVR I feel that she needs sildenafil so hydral/nitrates not an option. I am thus  leaning toward milrinone. I discussed this with her and she is reluctant but will not refuse if we think it is necessary. Will continue to follow co-ox but I think if it remains below 50 or 55% we should consider. Will cut carvedilol back to 12.5 and add low-dose losartan.  She has previously refused ICD. Continue diuresis as renal function tolerates.   She continue with low grade temps. Follow fever curve. With possible contamination of [name] will treat empirically with vanc while swan in.   Supp  K+   The patient is critically ill with multiple organ systems failure and requires high complexity decision making for assessment and support, frequent evaluation and titration of therapies, application of advanced monitoring technologies and extensive interpretation of multiple databases.   Critical Care Time devoted to patient care services described in this note is 45 Minutes.    Length of Stay: 2 Glori Bickers MD 11/03/2013, 10:36 AM  Advanced Heart Failure Team Pager [phone] (M-F; 7a - 4p)  Please contact [facility] for night-coverage after hours (4p -7a ) and weekends on [email or web address]
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
## 2013-11-04 DIAGNOSIS — N183 Chronic kidney disease, stage 3 unspecified: Secondary | ICD-10-CM

## 2013-11-04 DIAGNOSIS — J398 Other specified diseases of upper respiratory tract: Secondary | ICD-10-CM | POA: Diagnosis not present

## 2013-11-04 DIAGNOSIS — I5023 Acute on chronic systolic (congestive) heart failure: Secondary | ICD-10-CM | POA: Diagnosis not present

## 2013-11-04 DIAGNOSIS — I2789 Other specified pulmonary heart diseases: Secondary | ICD-10-CM | POA: Diagnosis not present

## 2013-11-04 LAB — CBC WITH DIFFERENTIAL/PLATELET
BASOS ABS: 0 10*3/uL (ref 0.0–0.1)
Basophils Relative: 0 % (ref 0–1)
Eosinophils Absolute: 0.2 10*3/uL (ref 0.0–0.7)
Eosinophils Relative: 3 % (ref 0–5)
HCT: 38.3 % (ref 36.0–46.0)
Hemoglobin: 12.1 g/dL (ref 12.0–15.0)
LYMPHS ABS: 2.4 10*3/uL (ref 0.7–4.0)
Lymphocytes Relative: 35 % (ref 12–46)
MCH: 27.1 pg (ref 26.0–34.0)
MCHC: 31.6 g/dL (ref 30.0–36.0)
MCV: 85.7 fL (ref 78.0–100.0)
MONO ABS: 0.8 10*3/uL (ref 0.1–1.0)
Monocytes Relative: 11 % (ref 3–12)
Neutro Abs: 3.5 10*3/uL (ref 1.7–7.7)
Neutrophils Relative %: 51 % (ref 43–77)
Platelets: 137 10*3/uL — ABNORMAL LOW (ref 150–400)
RBC: 4.47 MIL/uL (ref 3.87–5.11)
RDW: 14.7 % (ref 11.5–15.5)
WBC: 7 10*3/uL (ref 4.0–10.5)

## 2013-11-04 LAB — BASIC METABOLIC PANEL
ANION GAP: 14 (ref 5–15)
BUN: 37 mg/dL — ABNORMAL HIGH (ref 6–23)
CO2: 31 mEq/L (ref 19–32)
Calcium: 7.9 mg/dL — ABNORMAL LOW (ref 8.4–10.5)
Chloride: 101 mEq/L (ref 96–112)
Creatinine, Ser: 1.74 mg/dL — ABNORMAL HIGH (ref 0.50–1.10)
GFR calc Af Amer: 34 mL/min — ABNORMAL LOW (ref >90)
GFR, EST NON AFRICAN AMERICAN: 29 mL/min — AB (ref >90)
GLUCOSE: 48 mg/dL — AB (ref 70–99)
Potassium: 3.7 mEq/L (ref 3.7–5.3)
Sodium: 146 mEq/L (ref 137–147)

## 2013-11-04 LAB — CARBOXYHEMOGLOBIN
Carboxyhemoglobin: 1.4 % (ref 0.5–1.5)
Methemoglobin: 0.7 % (ref 0.0–1.5)
O2 Saturation: 49.5 %
TOTAL HEMOGLOBIN: 12.6 g/dL (ref 12.0–16.0)

## 2013-11-04 LAB — GLUCOSE, CAPILLARY
Glucose-Capillary: 129 mg/dL — ABNORMAL HIGH (ref 70–99)
Glucose-Capillary: 76 mg/dL (ref 70–99)
Glucose-Capillary: 70 mg/dL (ref 70–99)
Glucose-Capillary: 84 mg/dL (ref 70–99)

## 2013-11-04 MED ORDER — CARVEDILOL 6.25 MG PO TABS
6.2500 mg | ORAL_TABLET | Freq: Two times a day (BID) | ORAL | Status: DC
  Administered 2013-11-04 – 2013-11-06 (×4): 6.25 mg via ORAL
  Filled 2013-11-04 (×6): qty 1

## 2013-11-04 MED ORDER — DICLOFENAC SODIUM 1 % TD GEL
2.0000 g | Freq: Four times a day (QID) | TRANSDERMAL | Status: DC
  Administered 2013-11-04 – 2013-11-06 (×10): 2 g via TOPICAL
  Filled 2013-11-04: qty 100

## 2013-11-04 MED ORDER — LOSARTAN POTASSIUM 50 MG PO TABS
50.0000 mg | ORAL_TABLET | Freq: Every day | ORAL | Status: DC
  Administered 2013-11-04 – 2013-11-06 (×3): 50 mg via ORAL
  Filled 2013-11-04 (×3): qty 1

## 2013-11-04 NOTE — Progress Notes (Signed)
Advanced Heart Failure Rounding Note   Subjective:    Grace Bradford is a 68 y/o woman with multiple medical problems. She has h/o obesity, DM2, HTN, HL and CRI. She has a history of CHF with a diagnosis of NICM from 2007. Follow up studies showed a normal EF in 2009. In March of 2010 with acute pulmonary edema. Underwent cath by Dr. Felton Clinton showing EF 40% with mild non-obstructive CAD. Unfortunately cath complicated by acute MI thought due to embolization of LV clot. Had total occlusion of ostial LCx and distal LAD. Unable to be opened. PCI c/b dissection of large ramus branch. Post-cath course c/b contrast nephropathy. EF now 20-25%. Refuses ICD.   HAs not been on ACE/ARB due to CKD (Cr has ranged 1.7-2.5)  Admitted 7/9 after RHC with markedly elevated biventricular pressures with PAPs  105 and PCWP 30 and PVR 10.6 Taken back to cath lab yesterday for replacement of Swan after it was dislodged. PA pressures much better after diuresis and addition of Revatio.   RHC 7/10 RA = 7  RV = 69/0/8  PA = 68/19 (38)  PCW = 14  Fick cardiac output/index = 4.7/2.2  PVR = 5.1 WU  FA sat = 91%  PA sat = 51%  Feels ok. Breathing better. Weight down 1 pound on oral torsemide. Vanc started yesterday for low grade temps and possible line infection. Tmax 99.5.   [name] numbers this am (done personally with nurse) CVP = 6 PA = 58/22 (36)  PCW = 16 Thermo = 4.1/1.9 Co-ox 49%   Objective:   Weight Range:  Vital Signs:   Temp:  [98.2 F (36.8 C)-99.5 F (37.5 C)] 98.2 F (36.8 C) (07/12 0615) Resp:  [15-34] 18 (07/12 0615) BP: (98-128)/(49-89) 103/89 mmHg (07/12 0615) SpO2:  [88 %-100 %] 95 % (07/12 0615) Weight:  [111.4 kg (245 lb 9.5 oz)] 111.4 kg (245 lb 9.5 oz) (07/12 0500) Last BM Date: 10/31/13  Weight change: Filed Weights   11/02/13 0500 11/03/13 0500 11/04/13 0500  Weight: 111.8 kg (246 lb 7.6 oz) 111.585 kg (246 lb) 111.4 kg (245 lb 9.5 oz)    Intake/Output:   Intake/Output Summary  (Last 24 hours) at 11/04/13 0829 Last data filed at 11/04/13 0600  Gross per 24 hour  Intake   1020 ml  Output   4700 ml  Net  -3680 ml     Physical Exam: General: Sitting in chair. NAD HEENT: normal  Neck: supple. RIJ swan Cor: PMI nonpalpable Regular rate & rhythm. Prominent P2. +s3 Lungs: clear  Abdomen: obese soft, nontender, nondistended. No bruits or masses. Good bowel sounds.  Extremities: no cyanosis, clubbing, rash, no edema. Multiple excoriations  Neuro: alert & orientedx3, cranial nerves grossly intact. moves all 4 extremities w/o difficulty. Affect pleasant   Telemetry: SR 80s  Labs: Basic Metabolic Panel:  Recent Labs Lab 11/01/13 1104 11/01/13 1530 11/02/13 0445 11/03/13 0500 11/04/13 0601  NA 148*  --  145 144 146  K 4.3  --  4.0 3.5* 3.7  CL 104  --  99 100 101  CO2 34*  --  33* 32 31  GLUCOSE 84  --  85 86 48*  BUN 42*  --  33* 37* 37*  CREATININE 1.68* 1.58* 1.46* 1.82* 1.74*  CALCIUM 7.6*  --  8.0* 7.6* 7.9*    Liver Function Tests: No results found for this basename: AST, ALT, ALKPHOS, BILITOT, PROT, ALBUMIN,  in the last 168 hours No results found for this  basename: LIPASE, AMYLASE,  in the last 168 hours No results found for this basename: AMMONIA,  in the last 168 hours  CBC:  Recent Labs Lab 11/01/13 1104 11/01/13 1530 11/04/13 0601  WBC 6.3 7.0 7.0  NEUTROABS  --   --  3.5  HGB 11.8* 11.0* 12.1  HCT 38.9 36.2 38.3  MCV 89.6 88.1 85.7  PLT 181 176 137*    Cardiac Enzymes: No results found for this basename: CKTOTAL, CKMB, CKMBINDEX, TROPONINI,  in the last 168 hours  BNP: BNP (last 3 results) No results found for this basename: PROBNP,  in the last 8760 hours   Other results:    Imaging: No results found.   Medications:     Scheduled Medications: . antiseptic oral rinse  15 mL Mouth Rinse BID  . aspirin EC  81 mg Oral Daily  . carvedilol  12.5 mg Oral BID WC  . Chlorhexidine Gluconate Cloth  6 each Topical  Q0600  . clopidogrel  75 mg Oral Daily  . diclofenac sodium  2 g Topical QID  . enoxaparin (LOVENOX) injection  50 mg Subcutaneous Q24H  . febuxostat  40 mg Oral Daily  . fesoterodine  4 mg Oral Daily  . Fluticasone Furoate-Vilanterol  1 puff Inhalation Daily  . guaiFENesin  1,200 mg Oral BID  . insulin aspart  0-20 Units Subcutaneous TID WC  . insulin aspart  0-5 Units Subcutaneous QHS  . insulin detemir  60 Units Subcutaneous QHS  . insulin detemir  80 Units Subcutaneous Daily  . ivabradine HCl  2.5 mg Oral BID  . levothyroxine  125 mcg Oral QAC breakfast  . Liraglutide  1.8 mg Subcutaneous QHS  . losartan  25 mg Oral Daily  . mupirocin ointment  1 application Nasal BID  . pregabalin  75 mg Oral Daily  . sildenafil  40 mg Oral TID  . sodium chloride  3 mL Intravenous Q12H  . sodium chloride  3 mL Intravenous Q12H  . tiotropium  1 capsule Inhalation Daily  . torsemide  60 mg Oral BID  . vancomycin  1,250 mg Intravenous Q24H    Infusions:    PRN Medications: sodium chloride, sodium chloride, acetaminophen, albuterol, benzonatate, HYDROcodone-acetaminophen, hydrOXYzine, ondansetron (ZOFRAN) IV, sodium chloride, sodium chloride   Assessment:   1) Severe pulmonary HTN 2) A/C systolic HF - EF 0000000 3) CAD 4) Morbid Obesity 5) DM2 - with peripheral neuropathy 6) CKD stage III 7) Chronic respiratory failure with severe tracheomalacia 8) Hypokalemia 9) Fever  Plan/Discussion:    Difficult situation. Hemodynamics improved with diuresis and sildenafil however outputs remain marginal. The question is whether she will need IV milrinone instead (or in addition to) sildenafil. She is reluctant about this - and so am I as she has refused ICD in past.   I will continue to cut carvedilol back and increase losartan. Hydral/nitrates not an option with sildenafil. We will pull swan and leave sheath. Follow co-ox. If stays below 50% would consider adding milrinone in next day or two.  If > 50% can go home off milrinone and see how she does.    Continues to refuse ICD. Can go to SDU. Renal function stable.   She continue with low grade temps. Follow fever curve. With possible contamination of [name] will treat empirically with vanc while swan in.   The patient is critically ill with multiple organ systems failure and requires high complexity decision making for assessment and support, frequent evaluation and titration of  therapies, application of advanced monitoring technologies and extensive interpretation of multiple databases.   Critical Care Time devoted to patient care services described in this note is 35 Minutes.    Length of Stay: 3 Glori Bickers MD 11/04/2013, 8:29 AM  Advanced Heart Failure Team Pager [phone] (M-F; [place])  Please contact [facility] for night-coverage after hours (4p -7a ) and weekends on [email or web address]

## 2013-11-05 DIAGNOSIS — I2789 Other specified pulmonary heart diseases: Secondary | ICD-10-CM | POA: Diagnosis not present

## 2013-11-05 DIAGNOSIS — I5023 Acute on chronic systolic (congestive) heart failure: Secondary | ICD-10-CM | POA: Diagnosis not present

## 2013-11-05 LAB — CBC WITH DIFFERENTIAL/PLATELET
Basophils Absolute: 0 10*3/uL (ref 0.0–0.1)
Basophils Relative: 0 % (ref 0–1)
EOS ABS: 0.2 10*3/uL (ref 0.0–0.7)
Eosinophils Relative: 3 % (ref 0–5)
HEMATOCRIT: 38.3 % (ref 36.0–46.0)
Hemoglobin: 12.2 g/dL (ref 12.0–15.0)
LYMPHS PCT: 33 % (ref 12–46)
Lymphs Abs: 2.2 10*3/uL (ref 0.7–4.0)
MCH: 26.9 pg (ref 26.0–34.0)
MCHC: 31.9 g/dL (ref 30.0–36.0)
MCV: 84.5 fL (ref 78.0–100.0)
MONO ABS: 0.8 10*3/uL (ref 0.1–1.0)
Monocytes Relative: 11 % (ref 3–12)
Neutro Abs: 3.5 10*3/uL (ref 1.7–7.7)
Neutrophils Relative %: 53 % (ref 43–77)
Platelets: 141 10*3/uL — ABNORMAL LOW (ref 150–400)
RBC: 4.53 MIL/uL (ref 3.87–5.11)
RDW: 14.6 % (ref 11.5–15.5)
WBC: 6.7 10*3/uL (ref 4.0–10.5)

## 2013-11-05 LAB — CARBOXYHEMOGLOBIN
CARBOXYHEMOGLOBIN: 1.7 % — AB (ref 0.5–1.5)
CARBOXYHEMOGLOBIN: 1.6 % — AB (ref 0.5–1.5)
Carboxyhemoglobin: 1.5 % (ref 0.5–1.5)
Carboxyhemoglobin: 1.8 % — ABNORMAL HIGH (ref 0.5–1.5)
METHEMOGLOBIN: 0.6 % (ref 0.0–1.5)
Methemoglobin: 0.7 % (ref 0.0–1.5)
Methemoglobin: 1 % (ref 0.0–1.5)
Methemoglobin: 0.5 % (ref 0.0–1.5)
O2 SAT: 61.5 %
O2 SAT: 99.4 %
O2 SAT: 61.2 %
O2 Saturation: 80.8 %
TOTAL HEMOGLOBIN: 12.7 g/dL (ref 12.0–16.0)
Total hemoglobin: 13.2 g/dL (ref 12.0–16.0)
Total hemoglobin: 4.3 g/dL — CL (ref 12.0–16.0)
Total hemoglobin: 12.8 g/dL (ref 12.0–16.0)

## 2013-11-05 LAB — GLUCOSE, CAPILLARY
GLUCOSE-CAPILLARY: 106 mg/dL — AB (ref 70–99)
Glucose-Capillary: 64 mg/dL — ABNORMAL LOW (ref 70–99)
Glucose-Capillary: 184 mg/dL — ABNORMAL HIGH (ref 70–99)
Glucose-Capillary: 131 mg/dL — ABNORMAL HIGH (ref 70–99)
Glucose-Capillary: 138 mg/dL — ABNORMAL HIGH (ref 70–99)
Glucose-Capillary: 107 mg/dL — ABNORMAL HIGH (ref 70–99)

## 2013-11-05 LAB — BASIC METABOLIC PANEL
Anion gap: 17 — ABNORMAL HIGH (ref 5–15)
BUN: 39 mg/dL — AB (ref 6–23)
CO2: 27 meq/L (ref 19–32)
CREATININE: 1.69 mg/dL — AB (ref 0.50–1.10)
Calcium: 8 mg/dL — ABNORMAL LOW (ref 8.4–10.5)
Chloride: 98 mEq/L (ref 96–112)
GFR calc Af Amer: 35 mL/min — ABNORMAL LOW (ref >90)
GFR calc non Af Amer: 30 mL/min — ABNORMAL LOW (ref >90)
Glucose, Bld: 107 mg/dL — ABNORMAL HIGH (ref 70–99)
Potassium: 3.3 mEq/L — ABNORMAL LOW (ref 3.7–5.3)
Sodium: 142 mEq/L (ref 137–147)

## 2013-11-05 MED ORDER — SILDENAFIL CITRATE 20 MG PO TABS
60.0000 mg | ORAL_TABLET | Freq: Three times a day (TID) | ORAL | Status: DC
  Administered 2013-11-05 – 2013-11-06 (×4): 60 mg via ORAL
  Filled 2013-11-05 (×6): qty 3

## 2013-11-05 NOTE — Progress Notes (Addendum)
Patient ID: Grace Bradford, female   DOB: Oct 19, 1945, 68 y.o.   MRN: [id number] Advanced Heart Failure Rounding Note   Subjective:    Grace Bradford is a 68 y/o woman with multiple medical problems. She has h/o obesity, DM2, HTN, HL and CRI. She has a history of CHF with a diagnosis of NICM from 2007. Follow up studies showed a normal EF in 2009. In March of 2010 with acute pulmonary edema. Underwent cath by Dr. Felton Clinton showing EF 40% with mild non-obstructive CAD. Unfortunately cath complicated by acute MI thought due to embolization of LV clot. Had total occlusion of ostial LCx and distal LAD. Unable to be opened. PCI c/b dissection of large ramus branch. Post-cath course c/b contrast nephropathy. EF now 20-25%. Refuses ICD.   HAs not been on ACE/ARB due to CKD (Cr has ranged 1.7-2.5)  Admitted 7/9 after RHC with markedly elevated biventricular pressures with PAPs  105 and PCWP 30 and PVR 10.6 Taken back to cath lab yesterday for replacement of Swan after it was dislodged. PA pressures much better after diuresis and addition of Revatio.   RHC 7/10 RA = 7  RV = 69/0/8  PA = 68/19 (38)  PCW = 14  Fick cardiac output/index = 4.7/2.2  PVR = 5.1 WU  FA sat = 91%  PA sat = 51%  Feels ok. Breathing better. Weight down 2 pounds with I/Os even on oral torsemide. Vanc started 7/11 for low grade temps and possible line infection. She is now afebrile with normal WBCs. Creatinine stable. Grace Bradford out.   CVP 6, co-ox 80% (?accuracy given significant rise).   Objective:   Weight Range:  Vital Signs:   Temp:  [98.3 F (36.8 C)-99.3 F (37.4 C)] 98.4 F (36.9 C) (07/13 0400) Pulse Rate:  [77-89] 77 (07/13 0600) Resp:  [20-30] 22 (07/13 0600) BP: (98-128)/(49-75) 106/49 mmHg (07/13 0600) SpO2:  [91 %-100 %] 91 % (07/13 0600) Weight:  [110.451 kg (243 lb 8 oz)] 110.451 kg (243 lb 8 oz) (07/13 0600) Last BM Date: 10/31/13  Weight change: Filed Weights   11/03/13 0500 11/04/13 0500 11/05/13 0600  Weight:  111.585 kg (246 lb) 111.4 kg (245 lb 9.5 oz) 110.451 kg (243 lb 8 oz)    Intake/Output:   Intake/Output Summary (Last 24 hours) at 11/05/13 0714 Last data filed at 11/05/13 0400  Gross per 24 hour  Intake 1497.5 ml  Output   1550 ml  Net  -52.5 ml     Physical Exam: General: Sitting in chair. NAD HEENT: normal  Neck: supple. RIJ swan, no JVD.  Cor: PMI nonpalpable Regular rate & rhythm. Prominent P2. No gallop. Lungs: clear  Abdomen: obese soft, nontender, nondistended. No bruits or masses. Good bowel sounds.  Extremities: no cyanosis, clubbing, rash, no edema. Multiple excoriations  Neuro: alert & orientedx3, cranial nerves grossly intact. moves all 4 extremities w/o difficulty. Affect pleasant   Telemetry: SR 80s  Labs: Basic Metabolic Panel:  Recent Labs Lab 11/01/13 1104 11/01/13 1530 11/02/13 0445 11/03/13 0500 11/04/13 0601 11/05/13 0500  NA 148*  --  145 144 146 142  K 4.3  --  4.0 3.5* 3.7 3.3*  CL 104  --  99 100 101 98  CO2 34*  --  33* 32 31 27  GLUCOSE 84  --  85 86 48* 107*  BUN 42*  --  33* 37* 37* 39*  CREATININE 1.68* 1.58* 1.46* 1.82* 1.74* 1.69*  CALCIUM 7.6*  --  8.0* 7.6*  7.9* 8.0*    Liver Function Tests: No results found for this basename: AST, ALT, ALKPHOS, BILITOT, PROT, ALBUMIN,  in the last 168 hours No results found for this basename: LIPASE, AMYLASE,  in the last 168 hours No results found for this basename: AMMONIA,  in the last 168 hours  CBC:  Recent Labs Lab 11/01/13 1104 11/01/13 1530 11/04/13 0601 11/05/13 0500  WBC 6.3 7.0 7.0 6.7  NEUTROABS  --   --  3.5 3.5  HGB 11.8* 11.0* 12.1 12.2  HCT 38.9 36.2 38.3 38.3  MCV 89.6 88.1 85.7 84.5  PLT 181 176 137* 141*    Cardiac Enzymes: No results found for this basename: CKTOTAL, CKMB, CKMBINDEX, TROPONINI,  in the last 168 hours  BNP: BNP (last 3 results) No results found for this basename: PROBNP,  in the last 8760 hours   Other results:    Imaging: No  results found.   Medications:     Scheduled Medications: . antiseptic oral rinse  15 mL Mouth Rinse BID  . aspirin EC  81 mg Oral Daily  . carvedilol  6.25 mg Oral BID WC  . Chlorhexidine Gluconate Cloth  6 each Topical Q0600  . clopidogrel  75 mg Oral Daily  . diclofenac sodium  2 g Topical QID  . enoxaparin (LOVENOX) injection  50 mg Subcutaneous Q24H  . febuxostat  40 mg Oral Daily  . fesoterodine  4 mg Oral Daily  . Fluticasone Furoate-Vilanterol  1 puff Inhalation Daily  . guaiFENesin  1,200 mg Oral BID  . insulin aspart  0-20 Units Subcutaneous TID WC  . insulin aspart  0-5 Units Subcutaneous QHS  . insulin detemir  60 Units Subcutaneous QHS  . insulin detemir  80 Units Subcutaneous Daily  . ivabradine HCl  2.5 mg Oral BID  . levothyroxine  125 mcg Oral QAC breakfast  . Liraglutide  1.8 mg Subcutaneous QHS  . losartan  50 mg Oral Daily  . mupirocin ointment  1 application Nasal BID  . pregabalin  75 mg Oral Daily  . sildenafil  60 mg Oral TID  . sodium chloride  3 mL Intravenous Q12H  . sodium chloride  3 mL Intravenous Q12H  . tiotropium  1 capsule Inhalation Daily  . torsemide  60 mg Oral BID  . 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  2) A/C systolic HF - EF 0000000 3) CAD 4) Morbid Obesity 5) DM2 - with peripheral neuropathy 6) CKD stage III 7) Chronic respiratory failure with severe tracheomalacia 8) Hypokalemia 9) Fever: Resolved with PCWP out.   Plan/Discussion:    Difficult situation. Hemodynamics improved with diuresis and sildenafil however outputs had been marginal. The question is whether she will need IV milrinone instead (or in addition to) sildenafil. She is reluctant about this - and  so am I as she has refused ICD in past.  This morning, co-ox is considerably higher.  I am unsure if this is accurate and will repeat.  If co-ox remains improved, would plan for home tomorrow.  Renal function stable, CVP 4 so will continue current torsemide.   Coreg cut back and losartan increased. Hydral/nitrates not an option with sildenafil. Will increase sildenafil to 60 tid.    Continues to refuse ICD.   Afebrile now,  WBCs normal.  Will continue vancomycin while RIJ sheath in.   Length of Stay: 4 Loralie Champagne MD 11/05/2013, 7:14 AM  Advanced Heart Failure Team Pager [phone] (M-F; [place])  Please contact [facility] for night-coverage after hours (4p -7a ) and weekends on [email or web address]

## 2013-11-05 NOTE — Care Management Note (Addendum)
    Page 1 of 1   11/06/2013     12:19:05 PM CARE MANAGEMENT NOTE 11/06/2013  Patient:  Grace Bradford, Grace Bradford   Account Number:  [id number]  Date Initiated:  11/05/2013  Documentation initiated by:  Elissa Hefty  Subjective/Objective Assessment:   adm w heart failure     Action/Plan:   lives w husband, pcp dr Viona Gilmore Wilson Singer   Anticipated DC Date:  11/06/2013   Anticipated DC Plan:  [place]  [facility]  CM consult      Choice offered to / List presented to:          [facility] arranged  [place] - 11 Patient Refused      Status of service:   Medicare Important Message given?  YES (If response is "NO", the following Medicare IM given date fields will be blank) Date Medicare IM given:  11/05/2013 Medicare IM given by:  Elissa Hefty Date Additional Medicare IM given:   Additional Medicare IM given by:    Discharge Disposition:    Per UR Regulation:  Reviewed for med. necessity/level of care/duration of stay  If discussed at [place] of Stay Meetings, dates discussed:   11/06/2013    Comments:  7/14 1217 debbie Jaymir Struble rn,bsn spoke w pt. she follows up in heart failure clinic. pt does not want hhc. she states fam keeps close eye on her and md but does not feel she needs hhc.

## 2013-11-05 NOTE — Progress Notes (Signed)
CARDIAC REHAB PHASE I   PRE:  Rate/Rhythm: 82 SR  BP:  Supine:   Sitting: 99/49  Standing:    SaO2: 97 RA  MODE:  Ambulation: 270 ft   POST:  Rate/Rhythm: 92 SR  BP:  Supine:   Sitting: 107/50  Standing:    SaO2: 97 RA 1422-1500 Assisted X 1 and used walker to ambulate. Gait steady with walker. Pt able to walk 270 feet without c/o of pain or SOB. Pt back to recliner after walk with call light in reach.  Rodney Langton RN 11/05/2013 2:56 PM

## 2013-11-05 NOTE — Progress Notes (Signed)
Inpatient Diabetes Program Recommendations  AACE/ADA: New Consensus Statement on Inpatient Glycemic Control (2013)  Target Ranges:  Prepandial:   less than 140 mg/dL      Peak postprandial:   less than 180 mg/dL (1-2 hours)      Critically ill patients:  140 - 180 mg/dL  Results for JERNIE, KOLACZ (MRN [id number]) as of 11/05/2013 11:12  Ref. Range 11/04/2013 12:17 11/04/2013 16:22 11/04/2013 22:26 11/05/2013 07:38  Glucose-Capillary Latest Range: 70-99 mg/dL 76 70 84 64 (L)   Inpatient Diabetes Program Recommendations Insulin - Basal: consider reducing Levemir dose to 55 units BID  Thank you  Raoul Pitch BSN, RN,CDE Inpatient Diabetes Coordinator [phone] (team pager)

## 2013-11-06 DIAGNOSIS — I2789 Other specified pulmonary heart diseases: Secondary | ICD-10-CM | POA: Diagnosis not present

## 2013-11-06 DIAGNOSIS — I5023 Acute on chronic systolic (congestive) heart failure: Secondary | ICD-10-CM | POA: Diagnosis not present

## 2013-11-06 LAB — BASIC METABOLIC PANEL
Anion gap: 17 — ABNORMAL HIGH (ref 5–15)
BUN: 49 mg/dL — ABNORMAL HIGH (ref 6–23)
CALCIUM: 7.4 mg/dL — AB (ref 8.4–10.5)
CO2: 25 mEq/L (ref 19–32)
CREATININE: 1.93 mg/dL — AB (ref 0.50–1.10)
Chloride: 101 mEq/L (ref 96–112)
GFR calc Af Amer: 30 mL/min — ABNORMAL LOW (ref >90)
GFR calc non Af Amer: 26 mL/min — ABNORMAL LOW (ref >90)
GLUCOSE: 115 mg/dL — AB (ref 70–99)
Potassium: 3.2 mEq/L — ABNORMAL LOW (ref 3.7–5.3)
Sodium: 143 mEq/L (ref 137–147)

## 2013-11-06 LAB — CBC WITH DIFFERENTIAL/PLATELET
BASOS ABS: 0 10*3/uL (ref 0.0–0.1)
Basophils Relative: 0 % (ref 0–1)
EOS PCT: 5 % (ref 0–5)
Eosinophils Absolute: 0.4 10*3/uL (ref 0.0–0.7)
HEMATOCRIT: 37.9 % (ref 36.0–46.0)
Hemoglobin: 12 g/dL (ref 12.0–15.0)
LYMPHS ABS: 2.2 10*3/uL (ref 0.7–4.0)
Lymphocytes Relative: 30 % (ref 12–46)
MCH: 26.9 pg (ref 26.0–34.0)
MCHC: 31.7 g/dL (ref 30.0–36.0)
MCV: 85 fL (ref 78.0–100.0)
MONO ABS: 0.7 10*3/uL (ref 0.1–1.0)
Monocytes Relative: 10 % (ref 3–12)
Neutro Abs: 4 10*3/uL (ref 1.7–7.7)
Neutrophils Relative %: 55 % (ref 43–77)
Platelets: 155 10*3/uL (ref 150–400)
RBC: 4.46 MIL/uL (ref 3.87–5.11)
RDW: 14.6 % (ref 11.5–15.5)
WBC: 7.2 10*3/uL (ref 4.0–10.5)

## 2013-11-06 LAB — CARBOXYHEMOGLOBIN
Carboxyhemoglobin: 1.4 % (ref 0.5–1.5)
Methemoglobin: 0.7 % (ref 0.0–1.5)
O2 Saturation: 77.6 %
TOTAL HEMOGLOBIN: 12.6 g/dL (ref 12.0–16.0)

## 2013-11-06 LAB — GLUCOSE, CAPILLARY
GLUCOSE-CAPILLARY: 93 mg/dL (ref 70–99)
Glucose-Capillary: 98 mg/dL (ref 70–99)

## 2013-11-06 MED ORDER — LOSARTAN POTASSIUM 50 MG PO TABS: 50.0000 mg | ORAL_TABLET | Freq: Every day | ORAL | Status: DC

## 2013-11-06 MED ORDER — TORSEMIDE 20 MG PO TABS: 60.0000 mg | ORAL_TABLET | Freq: Two times a day (BID) | ORAL | Status: DC

## 2013-11-06 MED ORDER — SILDENAFIL CITRATE 20 MG PO TABS: 60.0000 mg | ORAL_TABLET | Freq: Three times a day (TID) | ORAL | Status: DC

## 2013-11-06 MED ORDER — POTASSIUM CHLORIDE CRYS ER 20 MEQ PO TBCR
40.0000 meq | EXTENDED_RELEASE_TABLET | Freq: Once | ORAL | Status: AC
  Administered 2013-11-06: 40 meq via ORAL
  Filled 2013-11-06: qty 2

## 2013-11-06 MED ORDER — CARVEDILOL 6.25 MG PO TABS: 6.2500 mg | ORAL_TABLET | Freq: Two times a day (BID) | ORAL | Status: DC

## 2013-11-06 NOTE — Progress Notes (Signed)
[id number] Cardiac Rehab Completed CHF education with pt. I gave her CHF packet. We discussed CHF zones, daily weights, low sodium low carb diet, exercise guidelines and fluid restrictions. Pt voices understanding. Pt agrees to [place]. CRP in [place], will send referral.

## 2013-11-06 NOTE — Discharge Instructions (Signed)

## 2013-11-06 NOTE — Progress Notes (Signed)
Patient evaluated for community based chronic disease management services with [facility] as a benefit of patient's [employer]. Spoke with patient at bedside to explain [facility] services.  Patient has declined services at this time.  Left contact information and [facility] literature at bedside. Made Inpatient Case Manager aware that [facility] following. Of note, [facility] services does not replace or interfere with any services that are arranged by inpatient case management or social work.  For additional questions or referrals please contact Corliss Blacker BSN RN [facility] Liaison at [phone].

## 2013-11-06 NOTE — Progress Notes (Signed)
Patient ID: Grace Bradford, female   DOB: 12/22/1945, 68 y.o.   MRN: [id number] Advanced Heart Failure Rounding Note   Subjective:    Grace Bradford is a 68 y/o woman with multiple medical problems. She has h/o obesity, DM2, HTN, HL and CRI. She has a history of CHF with a diagnosis of NICM from 2007. Follow up studies showed a normal EF in 2009. In March of 2010 with acute pulmonary edema. Underwent cath by Dr. Felton Clinton showing EF 40% with mild non-obstructive CAD. Unfortunately cath complicated by acute MI thought due to embolization of LV clot. Had total occlusion of ostial LCx and distal LAD. Unable to be opened. PCI c/b dissection of large ramus branch. Post-cath course c/b contrast nephropathy. EF now 20-25%. Refuses ICD.   HAs not been on ACE/ARB due to CKD (Cr has ranged 1.7-2.5)  Admitted 7/9 after RHC with markedly elevated biventricular pressures with PAPs  105 and PCWP 30 and PVR 10.6 Taken back to cath lab yesterday for replacement of Swan after it was dislodged. PA pressures much better after diuresis and addition of Revatio.   RHC 7/10 RA = 7  RV = 69/0/8  PA = 68/19 (38)  PCW = 14  Fick cardiac output/index = 4.7/2.2  PVR = 5.1 WU  FA sat = 91%  PA sat = 51%  Yesterday sildenafil was increased to 60 mg tid. Denies SOB/Orthopnea. Wants to go home   CVP 7, co-ox 77%   Creatinine 1. 6>1.9  Objective:   Weight Range:  Vital Signs:   Temp:  [97.6 F (36.4 C)-98.8 F (37.1 C)] 98.1 F (36.7 C) (07/14 0300) Pulse Rate:  [78-87] 87 (07/14 0300) Resp:  [18-30] 21 (07/14 0300) BP: (75-130)/(49-74) 130/74 mmHg (07/14 0300) SpO2:  [91 %-97 %] 92 % (07/14 0300) Weight:  [245 lb 2.4 oz (111.2 kg)] 245 lb 2.4 oz (111.2 kg) (07/14 0300) Last BM Date: 11/03/13  Weight change: Filed Weights   11/04/13 0500 11/05/13 0600 11/06/13 0300  Weight: 245 lb 9.5 oz (111.4 kg) 243 lb 8 oz (110.451 kg) 245 lb 2.4 oz (111.2 kg)    Intake/Output:   Intake/Output Summary (Last 24 hours) at  11/06/13 0726 Last data filed at 11/05/13 2200  Gross per 24 hour  Intake    970 ml  Output    400 ml  Net    570 ml     Physical Exam: CVP 7 General: Lying in bed.  HEENT: normal  Neck: supple. RIJ swan, no JVD.  Cor: PMI nonpalpable Regular rate & rhythm. Prominent P2. No gallop. Lungs: clear  Abdomen: obese soft, nontender, nondistended. No bruits or masses. Good bowel sounds.  Extremities: no cyanosis, clubbing, rash, no edema. Multiple excoriations  Neuro: alert & orientedx3, cranial nerves grossly intact. moves all 4 extremities w/o difficulty. Affect pleasant   Telemetry: SR 80s  Labs: Basic Metabolic Panel:  Recent Labs Lab 11/02/13 0445 11/03/13 0500 11/04/13 0601 11/05/13 0500 11/06/13 0410  NA 145 144 146 142 143  K 4.0 3.5* 3.7 3.3* 3.2*  CL 99 100 101 98 101  CO2 33* 32 31 27 25   GLUCOSE 85 86 48* 107* 115*  BUN 33* 37* 37* 39* 49*  CREATININE 1.46* 1.82* 1.74* 1.69* 1.93*  CALCIUM 8.0* 7.6* 7.9* 8.0* 7.4*    Liver Function Tests: No results found for this basename: AST, ALT, ALKPHOS, BILITOT, PROT, ALBUMIN,  in the last 168 hours No results found for this basename: LIPASE, AMYLASE,  in  the last 168 hours No results found for this basename: AMMONIA,  in the last 168 hours  CBC:  Recent Labs Lab 11/01/13 1104 11/01/13 1530 11/04/13 0601 11/05/13 0500 11/06/13 0410  WBC 6.3 7.0 7.0 6.7 7.2  NEUTROABS  --   --  3.5 3.5 4.0  HGB 11.8* 11.0* 12.1 12.2 12.0  HCT 38.9 36.2 38.3 38.3 37.9  MCV 89.6 88.1 85.7 84.5 85.0  PLT 181 176 137* 141* 155    Cardiac Enzymes: No results found for this basename: CKTOTAL, CKMB, CKMBINDEX, TROPONINI,  in the last 168 hours  BNP: BNP (last 3 results) No results found for this basename: PROBNP,  in the last 8760 hours   Other results:    Imaging: No results found.   Medications:     Scheduled Medications: . antiseptic oral rinse  15 mL Mouth Rinse BID  . aspirin EC  81 mg Oral Daily  .  carvedilol  6.25 mg Oral BID WC  . Chlorhexidine Gluconate Cloth  6 each Topical Q0600  . clopidogrel  75 mg Oral Daily  . diclofenac sodium  2 g Topical QID  . enoxaparin (LOVENOX) injection  50 mg Subcutaneous Q24H  . febuxostat  40 mg Oral Daily  . fesoterodine  4 mg Oral Daily  . Fluticasone Furoate-Vilanterol  1 puff Inhalation Daily  . guaiFENesin  1,200 mg Oral BID  . insulin aspart  0-20 Units Subcutaneous TID WC  . insulin aspart  0-5 Units Subcutaneous QHS  . insulin detemir  60 Units Subcutaneous QHS  . insulin detemir  80 Units Subcutaneous Daily  . ivabradine HCl  2.5 mg Oral BID  . levothyroxine  125 mcg Oral QAC breakfast  . Liraglutide  1.8 mg Subcutaneous QHS  . losartan  50 mg Oral Daily  . mupirocin ointment  1 application Nasal BID  . pregabalin  75 mg Oral Daily  . sildenafil  60 mg Oral TID  . sodium chloride  3 mL Intravenous Q12H  . sodium chloride  3 mL Intravenous Q12H  . tiotropium  1 capsule Inhalation Daily  . torsemide  60 mg Oral BID  . 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  2) A/C systolic HF - EF 0000000 3) CAD 4) Morbid Obesity 5) DM2 - with peripheral neuropathy 6) CKD stage III 7) Chronic respiratory failure with severe tracheomalacia 8) Hypokalemia 9) Fever: Resolved with PCWP out.   Plan/Discussion:    CO-OX 77%. No need for home milrinone. Creatinine trending up will hold torsemide. Volume status ok CVP 7. Continue Coreg 6.25 mg twice a day and losartan 50 mg daily. Hydral/nitrates not an option with sildenafil. Continue sildenafil to 60 tid.    Continues to refuse ICD.   Afebrile now, WBCs normal.  Will continue vancomycin while RIJ sheath in.   Home today with  follow up in next week in HF clinic with BMET.   Length of Stay: 5 CLEGG,AMYNP-C  11/06/2013, 7:26 AM  Advanced Heart Failure Team Pager [phone] (M-F; [place])  Please contact [facility] for night-coverage after hours (4p -7a ) and weekends on [email or web address]  Patient seen with NP, agree with the above note. Co-ox excellent on current regimen.  Creatinine is trending up.  I am going to have her hold  torsemide today and restart 60 mg bid (home dose) tomorrow.  She can go home today on adjusted Coreg and losartan and with the addition of sildenafil for mixed pulmonary hypertension.  She will followup next week in the office and will need to follow BMET closely.   Loralie Champagne 11/06/2013 8:47 AM

## 2013-11-06 NOTE — Discharge Summary (Signed)
Advanced Heart Failure Team  Discharge Summary   Patient ID: Grace Bradford MRN: [id number], DOB/AGE: May 10, 1945 68 y.o. 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  2) A/C systolic HF  - EF 20-25%  3) CAD  4) Morbid Obesity  5) DM2  - with peripheral neuropathy  6) CKD stage III  7) Chronic respiratory failure with severe tracheomalacia  8) Hypokalemia  9) Fever: Resolved with PCWP out.    Hospital Course:  Grace Bradford is a 68 y/o woman with multiple medical problems. She has h/o obesity, DM2, HTN, HL and CRI. She has a history of CHF with a diagnosis of NICM from 2007. Follow up studies showed a normal EF in 2009. In March of 2010 with acute pulmonary edema. Underwent cath by Dr. Garen Lah showing EF 40% with mild non-obstructive CAD. Unfortunately cath complicated by acute MI thought due to embolization of LV clot. Had total occlusion of ostial LCx and distal LAD. Unable to be opened. PCI c/b dissection of large ramus branch. Post-cath course c/b contrast nephropathy. EF now 20-25%. She refuses ICD.   She was admitted after RHC with markedly elevated biventricular pressures with PAPs 105 and PCWP 30 and PVR 10.6. She was taken back to the cath lab the next day for replacement of Swan after it was dislodged. PA pressures improved after diuresis and with the addition of Revatio. Even though her hemodynamics continued to improved,  Outputs remained marginal.  Milrinone was considered however she was reluctant and given that she doesn't have an ICD it was not pursued. At that point carvedilol was cut back to 6.25 mg twice a day and losartan was increased to 50 mg daily to bring down the SVR. Sildenafil was also increased to 60 mg tid without difficulty. Slowly she started to improve and maintained CO-OX > 60%. As her volume status improved she was transitioned to torsemide. She did have creatinine bump on the day of discharge  (creatinine 1.6>1.9) and she was instructed to hold torsemide for 24 hour.  She will not be placed on hydralazine/imdur due to sildenafil. She will remain off spironolactone due to CKD.   She received a short course of vancomycin for low grade temps thought to be from possible line infection. Ernestine Conrad was removed and PICC line was placed. Fevers resolved at discharge.  She will continue to be followed closely in the HF clinic. She will be seen July 21 at 9:20 and will check BMET.   RHC 11/01/13  RA = 23  RV = 108/8/27  PA = 102/47 (66)  PCW = 30  Fick cardiac output/index = 4.2/2.0  Them CO/CI = 3.4/1.6  PVR = 10.6  FA sat = 98%  PA sat = 53%, 58%   RHC 11/02/13  RA = 7  RV = 69/0/8  PA = 68/19 (38)  PCW = 14  Fick cardiac output/index = 4.7/2.2  PVR = 5.1 WU  FA sat = 91%  PA sat = 51%    Discharge Weight Range: 245 pounds.  Discharge Vitals: Blood pressure 124/63, pulse 81, temperature 97.8 F (36.6 C), temperature source Oral, resp. rate 24, height 5\' 2"  (1.575 m), weight 245 lb 2.4 oz (111.2 kg), SpO2 96.00%.  Labs: Lab Results  Component Value Date   WBC 7.2 11/06/2013   HGB 12.0 11/06/2013   HCT 37.9 11/06/2013   MCV 85.0 11/06/2013   PLT 155 11/06/2013  Recent Labs Lab 11/06/13 0410  NA 143  K 3.2*  CL 101  CO2 25  BUN 49*  CREATININE 1.93*  CALCIUM 7.4*  GLUCOSE 115*   No results found for this basename: CHOL, HDL, LDLCALC, TRIG   BNP (last 3 results) No results found for this basename: PROBNP,  in the last 8760 hours  Diagnostic Studies/Procedures   No results found.  Discharge Medications     Medication List    STOP taking these medications       chlorpheniramine-HYDROcodone 10-8 MG/5ML Lqcr  Commonly known as:  TUSSIONEX     nitroGLYCERIN 0.4 MG SL tablet  Commonly known as:  NITROSTAT     spironolactone 25 MG tablet  Commonly known as:  ALDACTONE      TAKE these medications       albuterol (5 MG/ML) 0.5% nebulizer solution  Commonly  known as:  PROVENTIL  Take 2.5 mg by nebulization daily as needed for shortness of breath.     aspirin 81 MG tablet  Take 81 mg by mouth daily.     BREO ELLIPTA 100-25 MCG/INH Aepb  Generic drug:  Fluticasone Furoate-Vilanterol  Inhale 2 puffs into the lungs daily.     carvedilol 6.25 MG tablet  Commonly known as:  COREG  Take 1 tablet (6.25 mg total) by mouth 2 (two) times daily with a meal.     guaiFENesin 600 MG 12 hr tablet  Commonly known as:  MUCINEX  Take 1,200 mg by mouth 4 (four) times daily.     HYDROcodone-acetaminophen 7.5-325 MG per tablet  Commonly known as:  NORCO  Take 1 tablet by mouth every 6 (six) hours as needed for moderate pain.     hydrOXYzine 25 MG tablet  Commonly known as:  ATARAX/VISTARIL  Take 25 mg by mouth every 6 (six) hours as needed for itching.     ivabradine HCl 5 MG Tabs tablet  Commonly known as:  CORLANOR  Take 5 mg by mouth 2 (two) times daily.     KLOR-CON M20 20 MEQ tablet  Generic drug:  potassium chloride SA  Take 20 mEq by mouth See admin instructions. 2 tablets in the am, and 1 tablet in the pm, daily     LEVEMIR FLEXPEN 100 UNIT/ML Pen  Generic drug:  Insulin Detemir  Inject 60-80 Units into the skin See admin instructions. 80 units AM & 60 units PM     loratadine 10 MG tablet  Commonly known as:  CLARITIN  Take 10 mg by mouth as needed. For allergies     losartan 50 MG tablet  Commonly known as:  COZAAR  Take 1 tablet (50 mg total) by mouth daily.     LYRICA 75 MG capsule  Generic drug:  pregabalin  Take 1 capsule by mouth Twice daily.     metaxalone 800 MG tablet  Commonly known as:  SKELAXIN  Take 800 mg by mouth 3 (three) times daily as needed. For muscle spasms     multivitamin tablet  Take 1 tablet by mouth daily.     omeprazole-sodium bicarbonate 40-1100 MG per capsule  Commonly known as:  ZEGERID  Take 1 capsule by mouth Daily.     PLAVIX 75 MG tablet  Generic drug:  clopidogrel  Take 1 tablet by  mouth Daily.     pravastatin 80 MG tablet  Commonly known as:  PRAVACHOL  Take 1 tablet by mouth Daily.     promethazine 25 MG tablet  Commonly known as:  PHENERGAN  Take 25 mg by mouth every 6 (six) hours as needed for nausea.     sildenafil 20 MG tablet  Commonly known as:  REVATIO  Take 3 tablets (60 mg total) by mouth 3 (three) times daily.     SPIRIVA HANDIHALER 18 MCG inhalation capsule  Generic drug:  tiotropium  Place 1 capsule into inhaler and inhale Daily.     SYNTHROID 125 MCG tablet  Generic drug:  levothyroxine  Take 1 tablet by mouth Daily.     tolterodine 4 MG 24 hr capsule  Commonly known as:  DETROL LA  Take 8 mg by mouth daily.     torsemide 20 MG tablet  Commonly known as:  DEMADEX  Take 3 tablets (60 mg total) by mouth 2 (two) times daily. Hold 7/14 and restart 7/15     ULORIC 40 MG tablet  Generic drug:  febuxostat  Take 1 tablet by mouth Daily.     VICTOZA [place]  Inject 1.8 mg into the skin at bedtime.     zolpidem 10 MG tablet  Commonly known as:  AMBIEN  Take one at bedtime before sleep study.        Disposition   The patient will be discharged in stable condition to home.     Discharge Instructions   ACE Inhibitor / ARB already ordered    Complete by:  As directed      Diet - low sodium heart healthy    Complete by:  As directed      Heart Failure patients record your daily weight using the same scale at the same time of day    Complete by:  As directed      Increase activity slowly    Complete by:  As directed           Follow-up Information   Follow up with Marca Ancona, MD On 11/13/2013. (at 9:20 Garage Code 4000)    Specialty:  Cardiology   Contact information:   [address] [place].  [address] [place] [address] [phone]         Duration of Discharge Encounter: Greater than 35 minutes   Signed, Miriah Maruyama  NP-C  11/06/2013, 11:27 AM

## 2013-11-07 ENCOUNTER — Telehealth (HOSPITAL_COMMUNITY): Payer: Self-pay | Admitting: *Deleted

## 2013-11-07 NOTE — Telephone Encounter (Signed)
Received fax from [place] pt needs PA for Sildenafil, called insurance company at [phone], medication was approved through 11/08/14 although pt can not get med from local pharmacy it has to come from [place] and her insurance approves [facility], called them at [phone] and gave verbal order to [facility] pharmD as pt has no medication at this time he will have this expedited, pt is aware and agreeable, [facility] also aware

## 2013-11-09 ENCOUNTER — Telehealth: Payer: Self-pay | Admitting: Pulmonary Disease

## 2013-11-09 DIAGNOSIS — M1A00X0 Idiopathic chronic gout, unspecified site, without tophus (tophi): Secondary | ICD-10-CM | POA: Diagnosis not present

## 2013-11-09 DIAGNOSIS — E119 Type 2 diabetes mellitus without complications: Secondary | ICD-10-CM | POA: Diagnosis not present

## 2013-11-09 NOTE — Telephone Encounter (Signed)
Pt calling stating that she received a call from [place] stating that they needed to set up an appt for an ONO Nothing ordered in our system other than sleep study Pt is scheduled for sleep study 11/25/13  Arbie Cookey to call back once she finds order so that know what physician ordered and if it were even our office.  Will hole in triage until fax received

## 2013-11-12 NOTE — Telephone Encounter (Signed)
Called, spoke with Arbie Cookey with [facility].  Was advised they have an order signed by [facility] from 10/23/13 for ONO on RA.  They attempted to call pt qod since June 30, but wasn't able to reach her until she called them back on 11/07/13 and advised Arbie Cookey she had been in the hospital and did not use o2 during that time.  She was d/c'd on 11/06/13.  Dr. Lake Bells, pls advise if you would like to proceed with pt having an ONO.  I see there is an order in epic for a split night sleep study but do not see where an actual ONO order was placed in Epic.  Sleep study is scheduled for Aug 2. Pt has OV with you on Aug 11.

## 2013-11-12 NOTE — Telephone Encounter (Signed)
Yes to ONO

## 2013-11-12 NOTE — Telephone Encounter (Signed)
Called and spoke with pt and she is aware that BQ wants her to have the ONO.  i have called carol from [place] and she is aware that [facility] wants the pt to have the ONO and they will get this scheduled and make the pt aware. Nothing further is needed.

## 2013-11-13 ENCOUNTER — Ambulatory Visit (HOSPITAL_COMMUNITY)
Admit: 2013-11-13 | Discharge: 2013-11-13 | Disposition: A | Discharge status: Home / Self Care | Payer: Medicare Other | Source: Ambulatory Visit | Attending: Cardiology | Admitting: Cardiology

## 2013-11-13 ENCOUNTER — Ambulatory Visit (HOSPITAL_COMMUNITY): Payer: Medicare Other

## 2013-11-13 VITALS — BP 116/72 | HR 82 | Wt 239.2 lb

## 2013-11-13 DIAGNOSIS — G4733 Obstructive sleep apnea (adult) (pediatric): Secondary | ICD-10-CM | POA: Insufficient documentation

## 2013-11-13 DIAGNOSIS — I25119 Atherosclerotic heart disease of native coronary artery with unspecified angina pectoris: Secondary | ICD-10-CM

## 2013-11-13 DIAGNOSIS — I129 Hypertensive chronic kidney disease with stage 1 through stage 4 chronic kidney disease, or unspecified chronic kidney disease: Secondary | ICD-10-CM | POA: Diagnosis not present

## 2013-11-13 DIAGNOSIS — Z7901 Long term (current) use of anticoagulants: Secondary | ICD-10-CM | POA: Insufficient documentation

## 2013-11-13 DIAGNOSIS — K219 Gastro-esophageal reflux disease without esophagitis: Secondary | ICD-10-CM | POA: Diagnosis not present

## 2013-11-13 DIAGNOSIS — I272 Pulmonary hypertension, unspecified: Secondary | ICD-10-CM

## 2013-11-13 DIAGNOSIS — K5730 Diverticulosis of large intestine without perforation or abscess without bleeding: Secondary | ICD-10-CM | POA: Diagnosis not present

## 2013-11-13 DIAGNOSIS — IMO0002 Reserved for concepts with insufficient information to code with codable children: Secondary | ICD-10-CM | POA: Insufficient documentation

## 2013-11-13 DIAGNOSIS — I209 Angina pectoris, unspecified: Secondary | ICD-10-CM | POA: Insufficient documentation

## 2013-11-13 DIAGNOSIS — I509 Heart failure, unspecified: Secondary | ICD-10-CM | POA: Diagnosis not present

## 2013-11-13 DIAGNOSIS — Z8673 Personal history of transient ischemic attack (TIA), and cerebral infarction without residual deficits: Secondary | ICD-10-CM | POA: Insufficient documentation

## 2013-11-13 DIAGNOSIS — N183 Chronic kidney disease, stage 3 unspecified: Secondary | ICD-10-CM | POA: Diagnosis not present

## 2013-11-13 DIAGNOSIS — I5023 Acute on chronic systolic (congestive) heart failure: Secondary | ICD-10-CM | POA: Diagnosis not present

## 2013-11-13 DIAGNOSIS — Z7982 Long term (current) use of aspirin: Secondary | ICD-10-CM | POA: Diagnosis not present

## 2013-11-13 DIAGNOSIS — I252 Old myocardial infarction: Secondary | ICD-10-CM | POA: Diagnosis not present

## 2013-11-13 DIAGNOSIS — I428 Other cardiomyopathies: Secondary | ICD-10-CM | POA: Insufficient documentation

## 2013-11-13 DIAGNOSIS — G589 Mononeuropathy, unspecified: Secondary | ICD-10-CM | POA: Diagnosis not present

## 2013-11-13 DIAGNOSIS — E119 Type 2 diabetes mellitus without complications: Secondary | ICD-10-CM | POA: Diagnosis not present

## 2013-11-13 DIAGNOSIS — I5022 Chronic systolic (congestive) heart failure: Secondary | ICD-10-CM | POA: Diagnosis not present

## 2013-11-13 DIAGNOSIS — I2789 Other specified pulmonary heart diseases: Secondary | ICD-10-CM | POA: Diagnosis not present

## 2013-11-13 DIAGNOSIS — E669 Obesity, unspecified: Secondary | ICD-10-CM | POA: Diagnosis not present

## 2013-11-13 DIAGNOSIS — I2510 Atherosclerotic heart disease of native coronary artery without angina pectoris: Secondary | ICD-10-CM | POA: Insufficient documentation

## 2013-11-13 DIAGNOSIS — J9610 Chronic respiratory failure, unspecified whether with hypoxia or hypercapnia: Secondary | ICD-10-CM | POA: Insufficient documentation

## 2013-11-13 DIAGNOSIS — Z794 Long term (current) use of insulin: Secondary | ICD-10-CM | POA: Diagnosis not present

## 2013-11-13 LAB — BASIC METABOLIC PANEL
Anion gap: 21 — ABNORMAL HIGH (ref 5–15)
BUN: 78 mg/dL — AB (ref 6–23)
CHLORIDE: 86 meq/L — AB (ref 96–112)
CO2: 27 mEq/L (ref 19–32)
CREATININE: 2.32 mg/dL — AB (ref 0.50–1.10)
Calcium: 8.5 mg/dL (ref 8.4–10.5)
GFR calc Af Amer: 24 mL/min — ABNORMAL LOW (ref >90)
GFR calc non Af Amer: 21 mL/min — ABNORMAL LOW (ref >90)
GLUCOSE: 145 mg/dL — AB (ref 70–99)
POTASSIUM: 2.9 meq/L — AB (ref 3.7–5.3)
Sodium: 134 mEq/L — ABNORMAL LOW (ref 137–147)

## 2013-11-13 LAB — LIPID PANEL
CHOLESTEROL: 142 mg/dL (ref 0–200)
HDL: 54 mg/dL (ref >39)
LDL Cholesterol: 66 mg/dL (ref 0–99)
TRIGLYCERIDES: 108 mg/dL (ref <150)
Total CHOL/HDL Ratio: 2.6 RATIO
VLDL: 22 mg/dL (ref 0–40)

## 2013-11-13 LAB — PRO B NATRIURETIC PEPTIDE: Pro B Natriuretic peptide (BNP): 787.2 pg/mL — ABNORMAL HIGH (ref 0–125)

## 2013-11-13 MED ORDER — POTASSIUM CHLORIDE CRYS ER 20 MEQ PO TBCR: EXTENDED_RELEASE_TABLET | ORAL | Status: DC
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
## 2013-11-13 NOTE — Patient Instructions (Addendum)
Restart Potassium at 2 tabs in AM and 1 tab in PM  Labs today  Your physician recommends that you schedule a follow-up appointment in: 2 weeks

## 2013-11-15 DIAGNOSIS — E119 Type 2 diabetes mellitus without complications: Secondary | ICD-10-CM | POA: Diagnosis not present

## 2013-11-15 DIAGNOSIS — I509 Heart failure, unspecified: Secondary | ICD-10-CM | POA: Diagnosis not present

## 2013-11-16 ENCOUNTER — Telehealth (HOSPITAL_COMMUNITY): Payer: Self-pay | Admitting: *Deleted

## 2013-11-16 MED ORDER — TORSEMIDE 20 MG PO TABS: 60.0000 mg | ORAL_TABLET | Freq: Every day | ORAL | Status: DC

## 2013-11-16 NOTE — Telephone Encounter (Signed)
Pt aware, bmet 7/30

## 2013-11-16 NOTE — Telephone Encounter (Signed)
Message copied by Scarlette Calico on Fri Nov 16, 2013 11:32 AM ------      Message from: Larey Dresser      Created: Tue Nov 13, 2013  5:45 PM       Creatinine is higher.  Let's decrease torsemide to 60 mg once daily and restart KCl.  Will need to repeat BMET in 1 week. Please call patient. ------

## 2013-11-20 ENCOUNTER — Telehealth (HOSPITAL_COMMUNITY): Payer: Self-pay | Admitting: Vascular Surgery

## 2013-11-20 NOTE — Telephone Encounter (Signed)
Please call pt .Marland Kitchen Pt gained 5 lbs.. please

## 2013-11-21 NOTE — Telephone Encounter (Signed)
Left message trying to return patient's call concerning weight gain of approx. 5 lb.  She is coming in Friday 7/31 to have lab work done, instructed we could look her over at this time.  Instructed to call back if she needed to be fit in beforehand, otherwise we will see her Friday. Renee Pain

## 2013-11-22 ENCOUNTER — Other Ambulatory Visit (HOSPITAL_COMMUNITY): Payer: Medicare Other

## 2013-11-23 ENCOUNTER — Telehealth (HOSPITAL_COMMUNITY): Payer: Self-pay | Admitting: Cardiology

## 2013-11-23 ENCOUNTER — Ambulatory Visit (HOSPITAL_COMMUNITY)
Admission: RE | Admit: 2013-11-23 | Discharge: 2013-11-23 | Disposition: A | Discharge status: Home / Self Care | Payer: Medicare Other | Source: Ambulatory Visit | Attending: Internal Medicine | Admitting: Internal Medicine

## 2013-11-23 DIAGNOSIS — N183 Chronic kidney disease, stage 3 unspecified: Secondary | ICD-10-CM | POA: Diagnosis not present

## 2013-11-23 DIAGNOSIS — I5022 Chronic systolic (congestive) heart failure: Secondary | ICD-10-CM | POA: Diagnosis not present

## 2013-11-23 LAB — BASIC METABOLIC PANEL
Anion gap: 15 (ref 5–15)
BUN: 60 mg/dL — ABNORMAL HIGH (ref 6–23)
CO2: 25 mEq/L (ref 19–32)
Calcium: 7.7 mg/dL — ABNORMAL LOW (ref 8.4–10.5)
Chloride: 101 mEq/L (ref 96–112)
Creatinine, Ser: 1.83 mg/dL — ABNORMAL HIGH (ref 0.50–1.10)
GFR calc Af Amer: 32 mL/min — ABNORMAL LOW (ref >90)
GFR, EST NON AFRICAN AMERICAN: 28 mL/min — AB (ref >90)
GLUCOSE: 166 mg/dL — AB (ref 70–99)
POTASSIUM: 4.7 meq/L (ref 3.7–5.3)
Sodium: 141 mEq/L (ref 137–147)

## 2013-11-23 NOTE — Telephone Encounter (Signed)
Message copied by JEFFRIES, Sharlot Gowda on Fri Nov 23, 2013  2:42 PM ------      Message from: Larey Dresser      Created: Fri Nov 23, 2013 12:45 PM       K and creatinine better . ------

## 2013-11-23 NOTE — Telephone Encounter (Signed)
Pt aware.

## 2013-11-25 ENCOUNTER — Ambulatory Visit (HOSPITAL_BASED_OUTPATIENT_CLINIC_OR_DEPARTMENT_OTHER): Payer: Medicare Other | Attending: Pulmonary Disease | Admitting: Radiology

## 2013-11-25 VITALS — Ht 62.0 in | Wt 244.0 lb

## 2013-11-25 DIAGNOSIS — G4733 Obstructive sleep apnea (adult) (pediatric): Secondary | ICD-10-CM | POA: Diagnosis not present

## 2013-11-25 DIAGNOSIS — I4949 Other premature depolarization: Secondary | ICD-10-CM | POA: Diagnosis not present

## 2013-11-25 DIAGNOSIS — G4710 Hypersomnia, unspecified: Secondary | ICD-10-CM | POA: Diagnosis present

## 2013-11-25 DIAGNOSIS — G4730 Sleep apnea, unspecified: Secondary | ICD-10-CM | POA: Diagnosis present

## 2013-11-25 DIAGNOSIS — J398 Other specified diseases of upper respiratory tract: Secondary | ICD-10-CM

## 2013-11-27 ENCOUNTER — Ambulatory Visit (HOSPITAL_COMMUNITY)
Admission: RE | Admit: 2013-11-27 | Discharge: 2013-11-27 | Disposition: A | Discharge status: Home / Self Care | Payer: Medicare Other | Source: Ambulatory Visit | Attending: Internal Medicine | Admitting: Internal Medicine

## 2013-11-27 ENCOUNTER — Encounter (HOSPITAL_COMMUNITY): Payer: Self-pay

## 2013-11-27 VITALS — BP 122/80 | HR 93 | Wt 249.4 lb

## 2013-11-27 DIAGNOSIS — I129 Hypertensive chronic kidney disease with stage 1 through stage 4 chronic kidney disease, or unspecified chronic kidney disease: Secondary | ICD-10-CM | POA: Insufficient documentation

## 2013-11-27 DIAGNOSIS — E1142 Type 2 diabetes mellitus with diabetic polyneuropathy: Secondary | ICD-10-CM | POA: Diagnosis not present

## 2013-11-27 DIAGNOSIS — N189 Chronic kidney disease, unspecified: Secondary | ICD-10-CM | POA: Diagnosis not present

## 2013-11-27 DIAGNOSIS — I2789 Other specified pulmonary heart diseases: Secondary | ICD-10-CM

## 2013-11-27 DIAGNOSIS — E785 Hyperlipidemia, unspecified: Secondary | ICD-10-CM | POA: Insufficient documentation

## 2013-11-27 DIAGNOSIS — E1149 Type 2 diabetes mellitus with other diabetic neurological complication: Secondary | ICD-10-CM | POA: Insufficient documentation

## 2013-11-27 DIAGNOSIS — I272 Pulmonary hypertension, unspecified: Secondary | ICD-10-CM

## 2013-11-27 DIAGNOSIS — Z7982 Long term (current) use of aspirin: Secondary | ICD-10-CM | POA: Diagnosis not present

## 2013-11-27 DIAGNOSIS — E669 Obesity, unspecified: Secondary | ICD-10-CM | POA: Insufficient documentation

## 2013-11-27 DIAGNOSIS — Z8673 Personal history of transient ischemic attack (TIA), and cerebral infarction without residual deficits: Secondary | ICD-10-CM | POA: Diagnosis not present

## 2013-11-27 DIAGNOSIS — K219 Gastro-esophageal reflux disease without esophagitis: Secondary | ICD-10-CM | POA: Insufficient documentation

## 2013-11-27 DIAGNOSIS — J9610 Chronic respiratory failure, unspecified whether with hypoxia or hypercapnia: Secondary | ICD-10-CM | POA: Diagnosis not present

## 2013-11-27 DIAGNOSIS — G4733 Obstructive sleep apnea (adult) (pediatric): Secondary | ICD-10-CM | POA: Diagnosis not present

## 2013-11-27 DIAGNOSIS — Z8249 Family history of ischemic heart disease and other diseases of the circulatory system: Secondary | ICD-10-CM | POA: Diagnosis not present

## 2013-11-27 DIAGNOSIS — M549 Dorsalgia, unspecified: Secondary | ICD-10-CM | POA: Diagnosis not present

## 2013-11-27 DIAGNOSIS — I5022 Chronic systolic (congestive) heart failure: Secondary | ICD-10-CM | POA: Diagnosis not present

## 2013-11-27 DIAGNOSIS — N184 Chronic kidney disease, stage 4 (severe): Secondary | ICD-10-CM | POA: Diagnosis not present

## 2013-11-27 DIAGNOSIS — J398 Other specified diseases of upper respiratory tract: Secondary | ICD-10-CM | POA: Diagnosis not present

## 2013-11-27 DIAGNOSIS — I509 Heart failure, unspecified: Secondary | ICD-10-CM | POA: Diagnosis not present

## 2013-11-27 DIAGNOSIS — I428 Other cardiomyopathies: Secondary | ICD-10-CM | POA: Insufficient documentation

## 2013-11-27 DIAGNOSIS — I252 Old myocardial infarction: Secondary | ICD-10-CM | POA: Diagnosis not present

## 2013-11-27 DIAGNOSIS — J449 Chronic obstructive pulmonary disease, unspecified: Secondary | ICD-10-CM | POA: Diagnosis not present

## 2013-11-27 DIAGNOSIS — I5023 Acute on chronic systolic (congestive) heart failure: Secondary | ICD-10-CM

## 2013-11-27 DIAGNOSIS — J988 Other specified respiratory disorders: Secondary | ICD-10-CM | POA: Diagnosis not present

## 2013-11-27 DIAGNOSIS — I2510 Atherosclerotic heart disease of native coronary artery without angina pectoris: Secondary | ICD-10-CM

## 2013-11-27 MED ORDER — IVABRADINE HCL 5 MG PO TABS: 7.5000 mg | ORAL_TABLET | Freq: Two times a day (BID) | ORAL | Status: DC

## 2013-11-27 MED ORDER — TORSEMIDE 20 MG PO TABS: ORAL_TABLET | ORAL | Status: DC

## 2013-11-27 NOTE — Addendum Note (Signed)
Encounter addended by: Kerry Dory, CMA on: 11/27/2013  1:14 PM<BR>     Documentation filed: Orders

## 2013-11-27 NOTE — Patient Instructions (Signed)
INCREASE Corlanor to 7.5 mg twice a day INCREASE Torsemide to 60 mg in the AM and 40 mg in the PM  Labs needed in one week (BmET)  Your physician recommends that you schedule a follow-up appointment in: 2 weeks  Do the following things EVERYDAY: 1) Weigh yourself in the morning before breakfast. Write it down and keep it in a log. 2) Take your medicines as prescribed 3) Eat low salt foods-Limit salt (sodium) to 2000 mg per day.  4) Stay as active as you can everyday 5) Limit all fluids for the day to less than 2 liters 6)
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
## 2013-11-28 ENCOUNTER — Encounter (HOSPITAL_COMMUNITY): Payer: Medicare Other

## 2013-11-29 DIAGNOSIS — G4730 Sleep apnea, unspecified: Secondary | ICD-10-CM

## 2013-11-29 DIAGNOSIS — G4710 Hypersomnia, unspecified: Secondary | ICD-10-CM | POA: Diagnosis not present

## 2013-11-29 NOTE — Sleep Study (Signed)
   NAME: Grace Bradford DATE OF BIRTH:  Apr 10, 1946 MEDICAL RECORD NUMBER [id number]  LOCATION: [facility]  PHYSICIAN: Kathee Delton  DATE OF STUDY: 11/25/2013  SLEEP STUDY TYPE: Nocturnal Polysomnogram               REFERRING PHYSICIAN: Juanito Doom, MD  INDICATION FOR STUDY: Hypersomnia with sleep apnea  EPWORTH SLEEPINESS SCORE:  5 HEIGHT: 5\' 2"  (157.5 cm)  WEIGHT: 244 lb (110.678 kg)    Body mass index is 44.62 kg/(m^2).  NECK SIZE: 16 in.  MEDICATIONS: Reviewed in the sleep record  SLEEP ARCHITECTURE: The patient had a total sleep time of 375 minutes with no slow-wave sleep and decreased quantity of REM. Sleep onset latency was normal at 13 minutes, and REM onset was normal at 125 minutes. Sleep efficiency was mildly reduced at 89%.  RESPIRATORY DATA: The patient underwent a split night study where she was found to have 42 obstructive events in the first 168 minutes of sleep. This gave her an AHI of 15 events per hour. The events were not positional, and there was loud snoring noted throughout. By protocol, the patient was fitted with a medium ResMed air fit F10 full face mask, and CPAP titration was initiated. She was increased as high as 20 cm of water continued to have breakthrough events, and therefore was changed to bilevel at a pressure of 22/19 because of high pressure needs. Unfortunately, there was not enough time by the end of the study in order to reach a therapeutic pressure.  OXYGEN DATA: There was oxygen desaturation as low as 89% with the patient's obstructive events  CARDIAC DATA: Occasional PVC noted.  MOVEMENT/PARASOMNIA: No significant limb movements or other abnormal behaviors were noted.  IMPRESSION/ RECOMMENDATION:    1) split-night study reveals mild obstructive sleep apnea/hypopnea syndrome, with an AHI of 15 events per hour and oxygen desaturation as low as 89%. The patient was then fitted with a medium air fit F10 full face  mask, and CPAP pressure was increased as high as 20 cm of water with persistent breakthrough events. She was then changed to bilevel at 22/19 because of her high pressure needs, but there was inadequate time left during the night to optimize her pressure. I would recommend trying the patient on an auto bilevel device versus scheduling a return to the sleep Center for a full night of BiPAP titration. The patient should also be encouraged to work aggressively on weight loss.  2) occasional PVC noted, but no clinically significant arrhythmias were seen.     [place], American Board of Sleep Medicine  ELECTRONICALLY SIGNED ON:  11/29/2013, 2:34 PM [place] PH: [phone]   FX: [phone] [place]

## 2013-11-30 NOTE — Addendum Note (Signed)
Addended by: Len Blalock on: 11/30/2013 05:18 PM   Modules accepted: Orders

## 2013-12-04 ENCOUNTER — Other Ambulatory Visit (HOSPITAL_COMMUNITY): Payer: Self-pay | Admitting: Cardiology

## 2013-12-04 ENCOUNTER — Encounter: Payer: Self-pay | Admitting: Pulmonary Disease

## 2013-12-04 ENCOUNTER — Ambulatory Visit (INDEPENDENT_AMBULATORY_CARE_PROVIDER_SITE_OTHER): Payer: Medicare Other | Admitting: Pulmonary Disease

## 2013-12-04 ENCOUNTER — Other Ambulatory Visit (INDEPENDENT_AMBULATORY_CARE_PROVIDER_SITE_OTHER): Payer: Medicare Other

## 2013-12-04 VITALS — BP 132/70 | HR 71 | Ht 62.5 in | Wt 244.0 lb

## 2013-12-04 DIAGNOSIS — I2510 Atherosclerotic heart disease of native coronary artery without angina pectoris: Secondary | ICD-10-CM | POA: Diagnosis not present

## 2013-12-04 DIAGNOSIS — J988 Other specified respiratory disorders: Secondary | ICD-10-CM | POA: Diagnosis not present

## 2013-12-04 DIAGNOSIS — I2789 Other specified pulmonary heart diseases: Secondary | ICD-10-CM

## 2013-12-04 DIAGNOSIS — J398 Other specified diseases of upper respiratory tract: Secondary | ICD-10-CM

## 2013-12-04 DIAGNOSIS — I509 Heart failure, unspecified: Secondary | ICD-10-CM

## 2013-12-04 DIAGNOSIS — J984 Other disorders of lung: Secondary | ICD-10-CM

## 2013-12-04 DIAGNOSIS — G4733 Obstructive sleep apnea (adult) (pediatric): Secondary | ICD-10-CM | POA: Diagnosis not present

## 2013-12-04 DIAGNOSIS — I5022 Chronic systolic (congestive) heart failure: Secondary | ICD-10-CM

## 2013-12-04 DIAGNOSIS — I272 Pulmonary hypertension, unspecified: Secondary | ICD-10-CM

## 2013-12-04 DIAGNOSIS — R911 Solitary pulmonary nodule: Secondary | ICD-10-CM

## 2013-12-04 LAB — BASIC METABOLIC PANEL
BUN: 25 mg/dL — ABNORMAL HIGH (ref 6–23)
CHLORIDE: 102 meq/L (ref 96–112)
CO2: 27 mEq/L (ref 19–32)
Calcium: 8.6 mg/dL (ref 8.4–10.5)
Creatinine, Ser: 1.6 mg/dL — ABNORMAL HIGH (ref 0.4–1.2)
GFR: 42.58 mL/min — AB (ref >60.00)
GLUCOSE: 122 mg/dL — AB (ref 70–99)
Potassium: 4.7 mEq/L (ref 3.5–5.1)
SODIUM: 141 meq/L (ref 135–145)

## 2013-12-04 MED ORDER — BENZONATATE 100 MG PO CAPS: 100.0000 mg | ORAL_CAPSULE | Freq: Four times a day (QID) | ORAL | Status: DC | PRN

## 2013-12-04 NOTE — Assessment & Plan Note (Signed)
This is complicated by systolic heart failure.  Continue sildenafil as prescribed by the heart failure clinic

## 2013-12-04 NOTE — Assessment & Plan Note (Signed)
She will need a repeat CT chest in December 2015

## 2013-12-04 NOTE — Assessment & Plan Note (Signed)
Her polysomnogram showed obstructive sleep apnea which in the setting of her pulmonary hypertension and systolic heart failure necessitates treatment with CPAP at a minimum. My partner felt that the most appropriate step would be to treat with bilevel. I will arrange for this at home with an auto titrating device.
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
## 2013-12-04 NOTE — Patient Instructions (Signed)
Take the tessalon perles as needed for cough Stop taking spiriva We will arrange a home Bilevel titration test for you Try to lose weight with diet and exercise We will see you back in 2-3 months

## 2013-12-04 NOTE — Assessment & Plan Note (Signed)
This is most likely due to obesity as there is no other evidence of an underlying connective tissue disease. The best form of treatment is weight loss and CPAP at night.  Plan:  -advised to lose weight -See obstructive sleep apnea

## 2013-12-04 NOTE — Assessment & Plan Note (Signed)
It does not sound like she has gained much benefit from [place] or [place].  Plan: -Trial off of bronchodilators

## 2013-12-10 ENCOUNTER — Telehealth: Payer: Self-pay

## 2013-12-10 ENCOUNTER — Encounter: Payer: Self-pay | Admitting: Pulmonary Disease

## 2013-12-10 NOTE — Telephone Encounter (Signed)
Message copied by Len Blalock on Mon Dec 10, 2013  2:57 PM ------      Message from: Simonne Maffucci B      Created: Mon Dec 10, 2013  2:45 PM       A,            Please let her know that her ONO was normal            Thanks      B ------

## 2013-12-10 NOTE — Telephone Encounter (Signed)
[phone] returning call or call her cell [phone]

## 2013-12-10 NOTE — Telephone Encounter (Signed)
lmtcb X1 to relay results. 

## 2013-12-10 NOTE — Telephone Encounter (Signed)
lmtcb X2 to relay results.
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
## 2013-12-11 ENCOUNTER — Encounter (HOSPITAL_COMMUNITY): Payer: Self-pay

## 2013-12-11 ENCOUNTER — Ambulatory Visit (HOSPITAL_COMMUNITY)
Admission: RE | Admit: 2013-12-11 | Discharge: 2013-12-11 | Disposition: A | Discharge status: Home / Self Care | Payer: Medicare Other | Source: Ambulatory Visit | Attending: Internal Medicine | Admitting: Internal Medicine

## 2013-12-11 VITALS — BP 112/70 | HR 70 | Resp 18 | Wt 243.0 lb

## 2013-12-11 DIAGNOSIS — I2789 Other specified pulmonary heart diseases: Secondary | ICD-10-CM | POA: Diagnosis not present

## 2013-12-11 DIAGNOSIS — J9610 Chronic respiratory failure, unspecified whether with hypoxia or hypercapnia: Secondary | ICD-10-CM | POA: Diagnosis not present

## 2013-12-11 DIAGNOSIS — IMO0002 Reserved for concepts with insufficient information to code with codable children: Secondary | ICD-10-CM | POA: Diagnosis not present

## 2013-12-11 DIAGNOSIS — I2510 Atherosclerotic heart disease of native coronary artery without angina pectoris: Secondary | ICD-10-CM | POA: Insufficient documentation

## 2013-12-11 DIAGNOSIS — K219 Gastro-esophageal reflux disease without esophagitis: Secondary | ICD-10-CM | POA: Diagnosis not present

## 2013-12-11 DIAGNOSIS — E785 Hyperlipidemia, unspecified: Secondary | ICD-10-CM | POA: Diagnosis not present

## 2013-12-11 DIAGNOSIS — I509 Heart failure, unspecified: Secondary | ICD-10-CM | POA: Diagnosis not present

## 2013-12-11 DIAGNOSIS — Z8673 Personal history of transient ischemic attack (TIA), and cerebral infarction without residual deficits: Secondary | ICD-10-CM | POA: Diagnosis not present

## 2013-12-11 DIAGNOSIS — N189 Chronic kidney disease, unspecified: Secondary | ICD-10-CM | POA: Insufficient documentation

## 2013-12-11 DIAGNOSIS — I129 Hypertensive chronic kidney disease with stage 1 through stage 4 chronic kidney disease, or unspecified chronic kidney disease: Secondary | ICD-10-CM | POA: Diagnosis not present

## 2013-12-11 DIAGNOSIS — Z7982 Long term (current) use of aspirin: Secondary | ICD-10-CM | POA: Insufficient documentation

## 2013-12-11 DIAGNOSIS — E1149 Type 2 diabetes mellitus with other diabetic neurological complication: Secondary | ICD-10-CM | POA: Diagnosis not present

## 2013-12-11 DIAGNOSIS — I5022 Chronic systolic (congestive) heart failure: Secondary | ICD-10-CM | POA: Diagnosis not present

## 2013-12-11 DIAGNOSIS — E1142 Type 2 diabetes mellitus with diabetic polyneuropathy: Secondary | ICD-10-CM | POA: Diagnosis not present

## 2013-12-11 DIAGNOSIS — I272 Pulmonary hypertension, unspecified: Secondary | ICD-10-CM

## 2013-12-11 DIAGNOSIS — I428 Other cardiomyopathies: Secondary | ICD-10-CM | POA: Diagnosis not present

## 2013-12-11 DIAGNOSIS — G4733 Obstructive sleep apnea (adult) (pediatric): Secondary | ICD-10-CM | POA: Diagnosis not present

## 2013-12-11 DIAGNOSIS — E669 Obesity, unspecified: Secondary | ICD-10-CM | POA: Insufficient documentation

## 2013-12-11 DIAGNOSIS — I252 Old myocardial infarction: Secondary | ICD-10-CM | POA: Insufficient documentation

## 2013-12-11 DIAGNOSIS — M1710 Unilateral primary osteoarthritis, unspecified knee: Secondary | ICD-10-CM | POA: Diagnosis not present

## 2013-12-11 DIAGNOSIS — Z794 Long term (current) use of insulin: Secondary | ICD-10-CM | POA: Insufficient documentation

## 2013-12-11 DIAGNOSIS — M159 Polyosteoarthritis, unspecified: Secondary | ICD-10-CM | POA: Diagnosis not present

## 2013-12-11 DIAGNOSIS — E119 Type 2 diabetes mellitus without complications: Secondary | ICD-10-CM | POA: Diagnosis not present

## 2013-12-11 DIAGNOSIS — M1A00X0 Idiopathic chronic gout, unspecified site, without tophus (tophi): Secondary | ICD-10-CM | POA: Diagnosis not present

## 2013-12-11 DIAGNOSIS — M25569 Pain in unspecified knee: Secondary | ICD-10-CM | POA: Diagnosis not present

## 2013-12-11 NOTE — Patient Instructions (Signed)
Follow up 6  weeks with Dr Haroldine Laws   Do the following things EVERYDAY: 1) Weigh yourself in the morning before breakfast. Write it down and keep it in a log. 2) Take your medicines as prescribed 3) Eat low salt foods-Limit salt (sodium) to 2000 mg per day.  4) Stay as active as you can everyday 5) Limit all fluids for the day to less than 2 liters

## 2013-12-17 NOTE — Telephone Encounter (Signed)
Spoke with pt, she is aware of results.  Nothing further needed at this time.

## 2013-12-18 ENCOUNTER — Telehealth (HOSPITAL_COMMUNITY): Payer: Self-pay | Admitting: Vascular Surgery

## 2013-12-18 DIAGNOSIS — E119 Type 2 diabetes mellitus without complications: Secondary | ICD-10-CM | POA: Diagnosis not present

## 2013-12-18 DIAGNOSIS — I509 Heart failure, unspecified: Secondary | ICD-10-CM | POA: Diagnosis not present

## 2013-12-18 DIAGNOSIS — E789 Disorder of lipoprotein metabolism, unspecified: Secondary | ICD-10-CM | POA: Diagnosis not present

## 2013-12-18 DIAGNOSIS — N19 Unspecified kidney failure: Secondary | ICD-10-CM | POA: Diagnosis not present

## 2013-12-18 NOTE — Telephone Encounter (Signed)
Please call pt about prescription Revatio ... Pt called to get refill be [facility] does not have a prescription.. Please advise

## 2013-12-18 NOTE — Telephone Encounter (Signed)
Left message to call back  

## 2013-12-19 ENCOUNTER — Telehealth (HOSPITAL_COMMUNITY): Payer: Self-pay | Admitting: *Deleted

## 2013-12-19 MED ORDER — SILDENAFIL CITRATE 20 MG PO TABS: 60.0000 mg | ORAL_TABLET | Freq: Three times a day (TID) | ORAL | Status: DC

## 2013-12-19 NOTE — Telephone Encounter (Signed)
Received form from [place] pt needs PA for Corlanor 7.5 mg Twice daily, called [employer] at [phone] (pt's ID [id number]) med was approved through 04/26/14, pharmacy and pt aware

## 2013-12-19 NOTE — Telephone Encounter (Signed)
Spoke with pt refill for sildenafil sent into [facility]

## 2013-12-25 ENCOUNTER — Encounter (HOSPITAL_COMMUNITY): Payer: Self-pay | Admitting: Emergency Medicine

## 2013-12-25 ENCOUNTER — Emergency Department (HOSPITAL_COMMUNITY)
Admission: EM | Admit: 2013-12-25 | Discharge: 2013-12-25 | Disposition: A | Discharge status: Home / Self Care | Payer: Medicare Other | Attending: Emergency Medicine | Admitting: Emergency Medicine

## 2013-12-25 DIAGNOSIS — Z8669 Personal history of other diseases of the nervous system and sense organs: Secondary | ICD-10-CM | POA: Diagnosis not present

## 2013-12-25 DIAGNOSIS — I252 Old myocardial infarction: Secondary | ICD-10-CM | POA: Insufficient documentation

## 2013-12-25 DIAGNOSIS — X500XXA Overexertion from strenuous movement or load, initial encounter: Secondary | ICD-10-CM | POA: Insufficient documentation

## 2013-12-25 DIAGNOSIS — K219 Gastro-esophageal reflux disease without esophagitis: Secondary | ICD-10-CM | POA: Diagnosis not present

## 2013-12-25 DIAGNOSIS — Z8673 Personal history of transient ischemic attack (TIA), and cerebral infarction without residual deficits: Secondary | ICD-10-CM | POA: Insufficient documentation

## 2013-12-25 DIAGNOSIS — IMO0002 Reserved for concepts with insufficient information to code with codable children: Secondary | ICD-10-CM | POA: Diagnosis not present

## 2013-12-25 DIAGNOSIS — E119 Type 2 diabetes mellitus without complications: Secondary | ICD-10-CM | POA: Insufficient documentation

## 2013-12-25 DIAGNOSIS — E669 Obesity, unspecified: Secondary | ICD-10-CM | POA: Insufficient documentation

## 2013-12-25 DIAGNOSIS — N189 Chronic kidney disease, unspecified: Secondary | ICD-10-CM | POA: Diagnosis not present

## 2013-12-25 DIAGNOSIS — Z7982 Long term (current) use of aspirin: Secondary | ICD-10-CM | POA: Diagnosis not present

## 2013-12-25 DIAGNOSIS — I129 Hypertensive chronic kidney disease with stage 1 through stage 4 chronic kidney disease, or unspecified chronic kidney disease: Secondary | ICD-10-CM | POA: Diagnosis not present

## 2013-12-25 DIAGNOSIS — Z79899 Other long term (current) drug therapy: Secondary | ICD-10-CM | POA: Insufficient documentation

## 2013-12-25 DIAGNOSIS — Y9289 Other specified places as the place of occurrence of the external cause: Secondary | ICD-10-CM | POA: Diagnosis not present

## 2013-12-25 DIAGNOSIS — I509 Heart failure, unspecified: Secondary | ICD-10-CM | POA: Insufficient documentation

## 2013-12-25 DIAGNOSIS — Y9389 Activity, other specified: Secondary | ICD-10-CM | POA: Diagnosis not present

## 2013-12-25 DIAGNOSIS — I10 Essential (primary) hypertension: Secondary | ICD-10-CM | POA: Diagnosis not present

## 2013-12-25 DIAGNOSIS — S39012A Strain of muscle, fascia and tendon of lower back, initial encounter: Secondary | ICD-10-CM

## 2013-12-25 DIAGNOSIS — S336XXA Sprain of sacroiliac joint, initial encounter: Secondary | ICD-10-CM | POA: Diagnosis not present

## 2013-12-25 LAB — URINALYSIS, ROUTINE W REFLEX MICROSCOPIC
Bilirubin Urine: NEGATIVE
Glucose, UA: NEGATIVE mg/dL
Hgb urine dipstick: NEGATIVE
Ketones, ur: NEGATIVE mg/dL
NITRITE: NEGATIVE
PH: 5.5 (ref 5.0–8.0)
Protein, ur: NEGATIVE mg/dL
SPECIFIC GRAVITY, URINE: 1.011 (ref 1.005–1.030)
Urobilinogen, UA: 0.2 mg/dL (ref 0.0–1.0)

## 2013-12-25 LAB — URINE MICROSCOPIC-ADD ON

## 2013-12-25 MED ORDER — DIAZEPAM 5 MG PO TABS
5.0000 mg | ORAL_TABLET | Freq: Once | ORAL | Status: AC
  Administered 2013-12-25: 5 mg via ORAL
  Filled 2013-12-25: qty 1

## 2013-12-25 MED ORDER — DIAZEPAM 5 MG PO TABS: 2.5000 mg | ORAL_TABLET | Freq: Two times a day (BID) | ORAL | Status: DC | PRN
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
## 2013-12-25 NOTE — ED Notes (Signed)
Urine cup given to patient.  Patient is unable to void at this time.

## 2013-12-25 NOTE — Discharge Instructions (Signed)
Back Pain, Adult Low back pain is very common. About 1 in 5 people have back pain.The cause of low back pain is rarely dangerous. The pain often gets better over time.About half of people with a sudden onset of back pain feel better in just 2 weeks. About 8 in 10 people feel better by 6 weeks.  CAUSES Some common causes of back pain include:  Strain of the muscles or ligaments supporting the spine.  Wear and tear (degeneration) of the spinal discs.  Arthritis.  Direct injury to the back. DIAGNOSIS Most of the time, the direct cause of low back pain is not known.However, back pain can be treated effectively even when the exact cause of the pain is unknown.Answering your caregiver's questions about your overall health and symptoms is one of the most accurate ways to make sure the cause of your pain is not dangerous. If your caregiver needs more information, he or she may order lab work or imaging tests (X-rays or MRIs).However, even if imaging tests show changes in your back, this usually does not require surgery. HOME CARE INSTRUCTIONS For many people, back pain returns.Since low back pain is rarely dangerous, it is often a condition that people can learn to manageon their own.   Remain active. It is stressful on the back to sit or stand in one place. Do not sit, drive, or stand in one place for more than 30 minutes at a time. Take short walks on level surfaces as soon as pain allows.Try to increase the length of time you walk each day.  Do not stay in bed.Resting more than 1 or 2 days can delay your recovery.  Do not avoid exercise or work.Your body is made to move.It is not dangerous to be active, even though your back may hurt.Your back will likely heal faster if you return to being active before your pain is gone.  Pay attention to your body when you bend and lift. Many people have less discomfortwhen lifting if they bend their knees, keep the load close to their bodies,and  avoid twisting. Often, the most comfortable positions are those that put less stress on your recovering back.  Find a comfortable position to sleep. Use a firm mattress and lie on your side with your knees slightly bent. If you lie on your back, put a pillow under your knees.  Only take over-the-counter or prescription medicines as directed by your caregiver. Over-the-counter medicines to reduce pain and inflammation are often the most helpful.Your caregiver may prescribe muscle relaxant drugs.These medicines help dull your pain so you can more quickly return to your normal activities and healthy exercise.  Put ice on the injured area.  Put ice in a plastic bag.  Place a towel between your skin and the bag.  Leave the ice on for 15-20 minutes, 03-04 times a day for the first 2 to 3 days. After that, ice and heat may be alternated to reduce pain and spasms.  Ask your caregiver about trying back exercises and gentle massage. This may be of some benefit.  Avoid feeling anxious or stressed.Stress increases muscle tension and can worsen back pain.It is important to recognize when you are anxious or stressed and learn ways to manage it.Exercise is a great option. SEEK MEDICAL CARE IF:  You have pain that is not relieved with rest or medicine.  You have pain that does not improve in 1 week.  You have new symptoms.  You are generally not feeling well. SEEK   IMMEDIATE MEDICAL CARE IF:   You have pain that radiates from your back into your legs.  You develop new bowel or bladder control problems.  You have unusual weakness or numbness in your arms or legs.  You develop nausea or vomiting.  You develop abdominal pain.  You feel faint. Document Released: 04/12/2005 Document Revised: 10/12/2011 Document Reviewed: 08/14/2013 Exit[employer] Patient Information 2015 [employer]. This information is not intended to replace advice given to you by your health care provider. Make sure you  discuss any questions you have with your health care provider.  

## 2013-12-25 NOTE — ED Notes (Signed)
Pt reports right lower back pain x 4 days. Worse with movement and palpation of area. Pt denies any urinary symptoms. Pt has hx of kidney infections, states this feels similar. Pt denies abdominal pain, N/V/D. PT AO x4.

## 2013-12-25 NOTE — ED Provider Notes (Signed)
Medical screening examination/treatment/procedure(s) were performed by non-physician practitioner and as supervising physician I was immediately available for consultation/collaboration.   EKG Interpretation None       Charlesetta Shanks, MD 12/25/13 [phone]

## 2013-12-26 ENCOUNTER — Encounter: Payer: Self-pay | Admitting: Pulmonary Disease

## 2014-01-12 DIAGNOSIS — M722 Plantar fascial fibromatosis: Secondary | ICD-10-CM | POA: Diagnosis not present

## 2014-01-24 ENCOUNTER — Ambulatory Visit (HOSPITAL_COMMUNITY)
Admission: RE | Admit: 2014-01-24 | Discharge: 2014-01-24 | Disposition: A | Discharge status: Home / Self Care | Payer: Medicare Other | Source: Ambulatory Visit | Attending: Internal Medicine | Admitting: Internal Medicine

## 2014-01-24 VITALS — BP 140/76 | HR 78 | Wt 244.8 lb

## 2014-01-24 DIAGNOSIS — J9610 Chronic respiratory failure, unspecified whether with hypoxia or hypercapnia: Secondary | ICD-10-CM | POA: Insufficient documentation

## 2014-01-24 DIAGNOSIS — I10 Essential (primary) hypertension: Secondary | ICD-10-CM | POA: Insufficient documentation

## 2014-01-24 DIAGNOSIS — I270 Primary pulmonary hypertension: Secondary | ICD-10-CM

## 2014-01-24 DIAGNOSIS — I272 Other secondary pulmonary hypertension: Secondary | ICD-10-CM | POA: Diagnosis not present

## 2014-01-24 DIAGNOSIS — I2510 Atherosclerotic heart disease of native coronary artery without angina pectoris: Secondary | ICD-10-CM | POA: Insufficient documentation

## 2014-01-24 DIAGNOSIS — G4733 Obstructive sleep apnea (adult) (pediatric): Secondary | ICD-10-CM | POA: Insufficient documentation

## 2014-01-24 DIAGNOSIS — I5022 Chronic systolic (congestive) heart failure: Secondary | ICD-10-CM | POA: Insufficient documentation

## 2014-01-24 DIAGNOSIS — I509 Heart failure, unspecified: Secondary | ICD-10-CM | POA: Diagnosis present

## 2014-01-24 DIAGNOSIS — N189 Chronic kidney disease, unspecified: Secondary | ICD-10-CM | POA: Diagnosis not present

## 2014-01-24 DIAGNOSIS — K219 Gastro-esophageal reflux disease without esophagitis: Secondary | ICD-10-CM | POA: Insufficient documentation

## 2014-01-24 DIAGNOSIS — I252 Old myocardial infarction: Secondary | ICD-10-CM | POA: Insufficient documentation

## 2014-01-24 DIAGNOSIS — I130 Hypertensive heart and chronic kidney disease with heart failure and stage 1 through stage 4 chronic kidney disease, or unspecified chronic kidney disease: Secondary | ICD-10-CM | POA: Insufficient documentation

## 2014-01-24 DIAGNOSIS — I429 Cardiomyopathy, unspecified: Secondary | ICD-10-CM | POA: Diagnosis not present

## 2014-01-24 DIAGNOSIS — E119 Type 2 diabetes mellitus without complications: Secondary | ICD-10-CM | POA: Diagnosis not present

## 2014-01-24 DIAGNOSIS — Z8673 Personal history of transient ischemic attack (TIA), and cerebral infarction without residual deficits: Secondary | ICD-10-CM | POA: Insufficient documentation

## 2014-01-24 MED ORDER — POTASSIUM CHLORIDE CRYS ER 20 MEQ PO TBCR: 20.0000 meq | EXTENDED_RELEASE_TABLET | Freq: Every day | ORAL | Status: DC

## 2014-01-24 MED ORDER — LOSARTAN POTASSIUM 100 MG PO TABS: 100.0000 mg | ORAL_TABLET | Freq: Every day | ORAL | Status: DC
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
## 2014-01-24 NOTE — Addendum Note (Signed)
Encounter addended by: Kerry Dory, CMA on: 01/24/2014  1:08 PM<BR>     Documentation filed: Orders, Patient Instructions Section

## 2014-01-24 NOTE — Patient Instructions (Signed)
INCREASE Losartan to 100 mg daily. DECREASE Potassium to 20 meQ daily  Labs needed in one week (BMET)  Your physician has requested that you have an echocardiogram. Echocardiography is a painless test that uses sound waves to create images of your heart. It provides your doctor with information about the size and shape of your heart and how well your heart's chambers and valves are working. This procedure takes approximately one hour. There are no restrictions for this procedure.  Your physician recommends that you schedule a follow-up appointment in: 2 months  Do the following things EVERYDAY: 1) Weigh yourself in the morning before breakfast. Write it down and keep it in a log. 2) Take your medicines as prescribed 3) Eat low salt foods-Limit salt (sodium) to 2000 mg per day.  4) Stay as active as you can everyday 5) Limit all fluids for the day to less than 2 liters 6)

## 2014-01-25 ENCOUNTER — Other Ambulatory Visit (HOSPITAL_COMMUNITY): Payer: Self-pay | Admitting: Cardiology

## 2014-01-25 DIAGNOSIS — I509 Heart failure, unspecified: Secondary | ICD-10-CM

## 2014-01-28 ENCOUNTER — Encounter (HOSPITAL_COMMUNITY): Payer: Self-pay | Admitting: Internal Medicine

## 2014-01-29 ENCOUNTER — Ambulatory Visit (HOSPITAL_COMMUNITY)
Admission: RE | Admit: 2014-01-29 | Discharge: 2014-01-29 | Disposition: A | Discharge status: Home / Self Care | Payer: Medicare Other | Source: Ambulatory Visit | Attending: Internal Medicine | Admitting: Internal Medicine

## 2014-01-29 DIAGNOSIS — M722 Plantar fascial fibromatosis: Secondary | ICD-10-CM | POA: Diagnosis not present

## 2014-01-29 DIAGNOSIS — I5023 Acute on chronic systolic (congestive) heart failure: Secondary | ICD-10-CM

## 2014-01-29 DIAGNOSIS — I2721 Secondary pulmonary arterial hypertension: Secondary | ICD-10-CM

## 2014-01-29 DIAGNOSIS — I272 Other secondary pulmonary hypertension: Secondary | ICD-10-CM | POA: Diagnosis not present

## 2014-01-29 DIAGNOSIS — I5022 Chronic systolic (congestive) heart failure: Secondary | ICD-10-CM | POA: Diagnosis not present

## 2014-01-29 DIAGNOSIS — M79671 Pain in right foot: Secondary | ICD-10-CM | POA: Diagnosis not present

## 2014-01-29 DIAGNOSIS — K766 Portal hypertension: Secondary | ICD-10-CM

## 2014-01-29 LAB — BASIC METABOLIC PANEL
Anion gap: 15 (ref 5–15)
BUN: 43 mg/dL — ABNORMAL HIGH (ref 6–23)
CALCIUM: 8.2 mg/dL — AB (ref 8.4–10.5)
CO2: 26 meq/L (ref 19–32)
CREATININE: 1.81 mg/dL — AB (ref 0.50–1.10)
Chloride: 99 mEq/L (ref 96–112)
GFR calc Af Amer: 32 mL/min — ABNORMAL LOW (ref >90)
GFR calc non Af Amer: 28 mL/min — ABNORMAL LOW (ref >90)
Glucose, Bld: 163 mg/dL — ABNORMAL HIGH (ref 70–99)
Potassium: 4.4 mEq/L (ref 3.7–5.3)
Sodium: 140 mEq/L (ref 137–147)

## 2014-01-30 ENCOUNTER — Ambulatory Visit (HOSPITAL_COMMUNITY)
Admission: RE | Admit: 2014-01-30 | Discharge: 2014-01-30 | Disposition: A | Discharge status: Home / Self Care | Payer: Medicare Other | Source: Ambulatory Visit | Attending: Internal Medicine | Admitting: Internal Medicine

## 2014-01-30 ENCOUNTER — Encounter (HOSPITAL_COMMUNITY): Payer: Self-pay | Admitting: *Deleted

## 2014-01-30 DIAGNOSIS — I517 Cardiomegaly: Secondary | ICD-10-CM | POA: Insufficient documentation

## 2014-01-30 DIAGNOSIS — I10 Essential (primary) hypertension: Secondary | ICD-10-CM | POA: Diagnosis not present

## 2014-01-30 DIAGNOSIS — I340 Nonrheumatic mitral (valve) insufficiency: Secondary | ICD-10-CM | POA: Diagnosis not present

## 2014-01-30 DIAGNOSIS — E119 Type 2 diabetes mellitus without complications: Secondary | ICD-10-CM | POA: Diagnosis not present

## 2014-01-30 DIAGNOSIS — Z8673 Personal history of transient ischemic attack (TIA), and cerebral infarction without residual deficits: Secondary | ICD-10-CM | POA: Diagnosis not present

## 2014-01-30 DIAGNOSIS — I361 Nonrheumatic tricuspid (valve) insufficiency: Secondary | ICD-10-CM | POA: Insufficient documentation

## 2014-01-30 DIAGNOSIS — E785 Hyperlipidemia, unspecified: Secondary | ICD-10-CM | POA: Diagnosis not present

## 2014-01-30 DIAGNOSIS — I509 Heart failure, unspecified: Secondary | ICD-10-CM | POA: Diagnosis not present

## 2014-01-30 DIAGNOSIS — I369 Nonrheumatic tricuspid valve disorder, unspecified: Secondary | ICD-10-CM | POA: Diagnosis not present

## 2014-01-30 NOTE — Progress Notes (Signed)
Echo Lab  2D Echocardiogram completed.  [place], RDCS 01/30/2014 10:28 AM

## 2014-02-07 ENCOUNTER — Ambulatory Visit (INDEPENDENT_AMBULATORY_CARE_PROVIDER_SITE_OTHER): Payer: Medicare Other | Admitting: Pulmonary Disease

## 2014-02-07 ENCOUNTER — Encounter: Payer: Self-pay | Admitting: Pulmonary Disease

## 2014-02-07 VITALS — BP 134/70 | HR 68 | Ht 62.5 in | Wt 249.0 lb

## 2014-02-07 DIAGNOSIS — E669 Obesity, unspecified: Secondary | ICD-10-CM

## 2014-02-07 DIAGNOSIS — J398 Other specified diseases of upper respiratory tract: Secondary | ICD-10-CM

## 2014-02-07 DIAGNOSIS — J984 Other disorders of lung: Secondary | ICD-10-CM | POA: Diagnosis not present

## 2014-02-07 DIAGNOSIS — I5022 Chronic systolic (congestive) heart failure: Secondary | ICD-10-CM | POA: Diagnosis not present

## 2014-02-07 DIAGNOSIS — I270 Primary pulmonary hypertension: Secondary | ICD-10-CM

## 2014-02-07 DIAGNOSIS — R05 Cough: Secondary | ICD-10-CM

## 2014-02-07 DIAGNOSIS — J9610 Chronic respiratory failure, unspecified whether with hypoxia or hypercapnia: Secondary | ICD-10-CM

## 2014-02-07 DIAGNOSIS — R059 Cough, unspecified: Secondary | ICD-10-CM

## 2014-02-07 DIAGNOSIS — I272 Pulmonary hypertension, unspecified: Secondary | ICD-10-CM

## 2014-02-07 MED ORDER — OMEPRAZOLE 20 MG PO CPDR: 20.0000 mg | DELAYED_RELEASE_CAPSULE | Freq: Two times a day (BID) | ORAL | Status: DC

## 2014-02-07 NOTE — Assessment & Plan Note (Signed)
Stable interval. Continue treatment per the Advanced heart failure clinic.

## 2014-02-07 NOTE — Assessment & Plan Note (Signed)
Due to acid reflux. Plan: -weight loss -double PPI dose -GERD lifestyle modification education and encouragement given

## 2014-02-07 NOTE — Patient Instructions (Addendum)
Exercise regularly and lose weight Use the BIPAP machine Follow the GERD lifestyle modfications we gave you Increase the dose of the omeprazole (zegrid) to twice a day, I have changed your prescription We will get you enrolled in pulmonary rehab We will see you back in 6 months or sooner if needed

## 2014-02-07 NOTE — Assessment & Plan Note (Signed)
Educated at length to quit.  If she could lose weight it would help her cough (GERD related) and obesity hypoventilation syndrome and OSA.  Plan: -pulmonary rehab referral
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
## 2014-02-07 NOTE — Assessment & Plan Note (Signed)
I explained to her at length that the only real treatment for this is weight loss, followed by using BIPAP every night as it provides a "pneumoatic stent" for the airways.  There is no focal area of narrowing that would benefit from a physical airway stent.  Her husband is very helpful in motivating her. I encouraged her to use the BIPAP machine during the daytime while awake to try to get used to the way it feels.  Plan: -Use BIPAP -see obesity

## 2014-02-07 NOTE — Assessment & Plan Note (Signed)
Continue treatment per Advanced heart failure treatment

## 2014-02-07 NOTE — Assessment & Plan Note (Signed)
She has had no significant change in her breathing off of bronchodilators.  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

## 2014-02-07 NOTE — Addendum Note (Signed)
Addended by: Len Blalock on: 02/07/2014 02:21 PM   Modules accepted: Orders

## 2014-02-09 ENCOUNTER — Other Ambulatory Visit: Payer: Self-pay

## 2014-02-11 ENCOUNTER — Telehealth (HOSPITAL_COMMUNITY): Payer: Self-pay

## 2014-02-11 NOTE — Telephone Encounter (Signed)
I have called and left a message with [facility] to inquire about participation in Pulmonary Rehab. Will send letter in mail and follow up.

## 2014-02-12 ENCOUNTER — Other Ambulatory Visit: Payer: Self-pay

## 2014-02-12 MED ORDER — OMEPRAZOLE 20 MG PO CPDR: 20.0000 mg | DELAYED_RELEASE_CAPSULE | Freq: Two times a day (BID) | ORAL | Status: DC

## 2014-02-15 ENCOUNTER — Telehealth (HOSPITAL_COMMUNITY): Payer: Self-pay

## 2014-02-15 NOTE — Telephone Encounter (Signed)
Called patient regarding entrance to Pulmonary Rehab.  Patient states that they are interested in attending the program.  Grace Bradford is going to verify insurance coverage and follow up.

## 2014-02-20 DIAGNOSIS — E8770 Fluid overload, unspecified: Secondary | ICD-10-CM | POA: Diagnosis not present

## 2014-02-20 DIAGNOSIS — I272 Other secondary pulmonary hypertension: Secondary | ICD-10-CM | POA: Diagnosis not present

## 2014-02-20 DIAGNOSIS — N183 Chronic kidney disease, stage 3 (moderate): Secondary | ICD-10-CM | POA: Diagnosis not present

## 2014-02-20 DIAGNOSIS — I10 Essential (primary) hypertension: Secondary | ICD-10-CM | POA: Diagnosis not present

## 2014-02-21 ENCOUNTER — Telehealth (HOSPITAL_COMMUNITY): Payer: Self-pay

## 2014-02-21 NOTE — Telephone Encounter (Signed)
Called patient to see if she got the opportunity to contact insurance companies to verify coverage for Pulmonary Rehab.  Patient states that she has not had time yet and that she would call next week since they are going out of town today for the weekend.

## 2014-03-04 ENCOUNTER — Telehealth (HOSPITAL_COMMUNITY): Payer: Self-pay

## 2014-03-04 NOTE — Telephone Encounter (Signed)
I have called and left a message with [employer] to inquire about participation in Pulmonary Rehab. Will send letter in mail and follow up.

## 2014-03-11 ENCOUNTER — Telehealth (HOSPITAL_COMMUNITY): Payer: Self-pay | Admitting: Vascular Surgery

## 2014-03-11 DIAGNOSIS — I509 Heart failure, unspecified: Secondary | ICD-10-CM | POA: Diagnosis not present

## 2014-03-11 DIAGNOSIS — J811 Chronic pulmonary edema: Secondary | ICD-10-CM | POA: Diagnosis not present

## 2014-03-11 DIAGNOSIS — R05 Cough: Secondary | ICD-10-CM | POA: Diagnosis not present

## 2014-03-11 DIAGNOSIS — E118 Type 2 diabetes mellitus with unspecified complications: Secondary | ICD-10-CM | POA: Diagnosis not present

## 2014-03-11 DIAGNOSIS — E790 Hyperuricemia without signs of inflammatory arthritis and tophaceous disease: Secondary | ICD-10-CM | POA: Diagnosis not present

## 2014-03-11 MED ORDER — METOLAZONE 2.5 MG PO TABS: 2.5000 mg | ORAL_TABLET | ORAL | Status: DC

## 2014-03-11 NOTE — Telephone Encounter (Signed)
Left message to call back  

## 2014-03-11 NOTE — Telephone Encounter (Signed)
Spoke w/pt she states wt up over past couple of weeks, she has noticed increased SOB over past few days, she states her abd is distended, no LE edema.  She states she has been taking an extra 20 mg of Torsemide for past 3 days but wt is not coming down, today wt is 250 and usually runs about 240.  Discussed w/Dr Bensimhon will have pt take metolazone 2.5 mg for 2 days along with extra 20 meq of KCL, pt is aware and agreeable, rx sent in

## 2014-03-11 NOTE — Telephone Encounter (Signed)
Pt called weight is up... 12 LBS in last 2 weeks.. Pt has been torsemide... Letter from [facility] starting Jan 1 prior approval for [employer]  [id number]  [id number] FAX.Marland Kitchen

## 2014-03-12 DIAGNOSIS — I509 Heart failure, unspecified: Secondary | ICD-10-CM | POA: Diagnosis not present

## 2014-03-13 DIAGNOSIS — M150 Primary generalized (osteo)arthritis: Secondary | ICD-10-CM | POA: Diagnosis not present

## 2014-03-13 DIAGNOSIS — M1A09X0 Idiopathic chronic gout, multiple sites, without tophus (tophi): Secondary | ICD-10-CM | POA: Diagnosis not present

## 2014-03-13 DIAGNOSIS — M170 Bilateral primary osteoarthritis of knee: Secondary | ICD-10-CM | POA: Diagnosis not present

## 2014-03-13 DIAGNOSIS — M25511 Pain in right shoulder: Secondary | ICD-10-CM | POA: Diagnosis not present

## 2014-03-14 ENCOUNTER — Telehealth (HOSPITAL_COMMUNITY): Payer: Self-pay | Admitting: *Deleted

## 2014-03-27 ENCOUNTER — Encounter (HOSPITAL_COMMUNITY): Payer: Medicare Other

## 2014-04-04 ENCOUNTER — Encounter (HOSPITAL_COMMUNITY): Payer: Self-pay | Admitting: Internal Medicine

## 2014-04-05 ENCOUNTER — Other Ambulatory Visit (HOSPITAL_COMMUNITY): Payer: Self-pay | Admitting: Cardiology

## 2014-04-05 DIAGNOSIS — I5022 Chronic systolic (congestive) heart failure: Secondary | ICD-10-CM

## 2014-04-05 MED ORDER — IVABRADINE HCL 5 MG PO TABS: 7.5000 mg | ORAL_TABLET | Freq: Two times a day (BID) | ORAL | Status: DC

## 2014-04-08 ENCOUNTER — Ambulatory Visit (INDEPENDENT_AMBULATORY_CARE_PROVIDER_SITE_OTHER)
Admission: RE | Admit: 2014-04-08 | Discharge: 2014-04-08 | Disposition: A | Discharge status: Home / Self Care | Payer: Medicare Other | Source: Ambulatory Visit | Attending: Pulmonary Disease | Admitting: Pulmonary Disease

## 2014-04-08 ENCOUNTER — Telehealth (HOSPITAL_COMMUNITY): Payer: Self-pay

## 2014-04-08 DIAGNOSIS — R928 Other abnormal and inconclusive findings on diagnostic imaging of breast: Secondary | ICD-10-CM | POA: Diagnosis not present

## 2014-04-08 DIAGNOSIS — R911 Solitary pulmonary nodule: Secondary | ICD-10-CM | POA: Diagnosis not present

## 2014-04-08 NOTE — Telephone Encounter (Signed)
I have called and left a message with Grace Bradford to inquire about participation in Pulmonary Rehab. Will send letter in mail and follow up.

## 2014-04-11 NOTE — Progress Notes (Signed)
Quick Note:  lmtcb X1 for pt. ______ 

## 2014-04-12 ENCOUNTER — Encounter (HOSPITAL_COMMUNITY): Payer: Medicare Other

## 2014-04-15 DIAGNOSIS — E118 Type 2 diabetes mellitus with unspecified complications: Secondary | ICD-10-CM | POA: Diagnosis not present

## 2014-04-16 ENCOUNTER — Other Ambulatory Visit: Payer: Self-pay | Admitting: Rheumatology

## 2014-04-16 ENCOUNTER — Telehealth: Payer: Self-pay | Admitting: Pulmonary Disease

## 2014-04-16 DIAGNOSIS — M19011 Primary osteoarthritis, right shoulder: Secondary | ICD-10-CM | POA: Diagnosis not present

## 2014-04-16 DIAGNOSIS — M25511 Pain in right shoulder: Secondary | ICD-10-CM

## 2014-04-16 DIAGNOSIS — M150 Primary generalized (osteo)arthritis: Secondary | ICD-10-CM | POA: Diagnosis not present

## 2014-04-16 DIAGNOSIS — M170 Bilateral primary osteoarthritis of knee: Secondary | ICD-10-CM | POA: Diagnosis not present

## 2014-04-16 DIAGNOSIS — M1A09X0 Idiopathic chronic gout, multiple sites, without tophus (tophi): Secondary | ICD-10-CM | POA: Diagnosis not present

## 2014-04-16 NOTE — Progress Notes (Signed)
Quick Note:  lmtcb for pt. ______ 

## 2014-04-16 NOTE — Telephone Encounter (Signed)
Notes Recorded by Juanito Doom, MD on 04/10/2014 at 6:05 PM A, Please let her know that Dr. Barbie Banner the radiology says that the nodule has nearly disappeared which is good so she doesn't need more scans. However her breast tissue was abnormal and he wanted to make sure she has had a recent mamogram. If she hasn't had one in the last year then she needs one scheduled soon. Thanks B  Spoke with the pt and notified of results per Dr Lake Bells  She states that she has not had mammogram this year and will call her PCP so that they can set this up for her

## 2014-04-16 NOTE — Progress Notes (Signed)
Quick Note:  Pt notified- see PN dated 04/16/14 ______

## 2014-04-17 ENCOUNTER — Ambulatory Visit
Admission: RE | Admit: 2014-04-17 | Discharge: 2014-04-17 | Disposition: A | Discharge status: Home / Self Care | Payer: Medicare Other | Source: Ambulatory Visit | Attending: Rheumatology | Admitting: Rheumatology

## 2014-04-17 DIAGNOSIS — M62511 Muscle wasting and atrophy, not elsewhere classified, right shoulder: Secondary | ICD-10-CM | POA: Diagnosis not present

## 2014-04-17 DIAGNOSIS — M25511 Pain in right shoulder: Secondary | ICD-10-CM

## 2014-04-17 DIAGNOSIS — M7581 Other shoulder lesions, right shoulder: Secondary | ICD-10-CM | POA: Diagnosis not present

## 2014-04-24 DIAGNOSIS — I10 Essential (primary) hypertension: Secondary | ICD-10-CM | POA: Diagnosis not present

## 2014-04-24 DIAGNOSIS — E118 Type 2 diabetes mellitus with unspecified complications: Secondary | ICD-10-CM | POA: Diagnosis not present

## 2014-04-24 DIAGNOSIS — M25519 Pain in unspecified shoulder: Secondary | ICD-10-CM | POA: Diagnosis not present

## 2014-04-24 DIAGNOSIS — M109 Gout, unspecified: Secondary | ICD-10-CM | POA: Diagnosis not present

## 2014-04-30 DIAGNOSIS — M25511 Pain in right shoulder: Secondary | ICD-10-CM | POA: Diagnosis not present

## 2014-05-13 ENCOUNTER — Ambulatory Visit (HOSPITAL_COMMUNITY)
Admission: RE | Admit: 2014-05-13 | Discharge: 2014-05-13 | Disposition: A | Discharge status: Home / Self Care | Payer: Medicare Other | Source: Ambulatory Visit | Attending: Internal Medicine | Admitting: Internal Medicine

## 2014-05-13 VITALS — BP 132/62 | HR 76 | Wt 247.5 lb

## 2014-05-13 DIAGNOSIS — N183 Chronic kidney disease, stage 3 unspecified: Secondary | ICD-10-CM

## 2014-05-13 DIAGNOSIS — M75101 Unspecified rotator cuff tear or rupture of right shoulder, not specified as traumatic: Secondary | ICD-10-CM | POA: Diagnosis not present

## 2014-05-13 DIAGNOSIS — N189 Chronic kidney disease, unspecified: Secondary | ICD-10-CM | POA: Diagnosis not present

## 2014-05-13 DIAGNOSIS — J398 Other specified diseases of upper respiratory tract: Secondary | ICD-10-CM | POA: Diagnosis not present

## 2014-05-13 DIAGNOSIS — I2510 Atherosclerotic heart disease of native coronary artery without angina pectoris: Secondary | ICD-10-CM | POA: Insufficient documentation

## 2014-05-13 DIAGNOSIS — Z79899 Other long term (current) drug therapy: Secondary | ICD-10-CM | POA: Diagnosis not present

## 2014-05-13 DIAGNOSIS — I429 Cardiomyopathy, unspecified: Secondary | ICD-10-CM | POA: Insufficient documentation

## 2014-05-13 DIAGNOSIS — E669 Obesity, unspecified: Secondary | ICD-10-CM | POA: Diagnosis not present

## 2014-05-13 DIAGNOSIS — E789 Disorder of lipoprotein metabolism, unspecified: Secondary | ICD-10-CM | POA: Diagnosis not present

## 2014-05-13 DIAGNOSIS — R51 Headache: Secondary | ICD-10-CM | POA: Diagnosis not present

## 2014-05-13 DIAGNOSIS — G4733 Obstructive sleep apnea (adult) (pediatric): Secondary | ICD-10-CM | POA: Insufficient documentation

## 2014-05-13 DIAGNOSIS — I270 Primary pulmonary hypertension: Secondary | ICD-10-CM | POA: Diagnosis not present

## 2014-05-13 DIAGNOSIS — E119 Type 2 diabetes mellitus without complications: Secondary | ICD-10-CM | POA: Insufficient documentation

## 2014-05-13 DIAGNOSIS — I272 Pulmonary hypertension, unspecified: Secondary | ICD-10-CM

## 2014-05-13 DIAGNOSIS — Z7982 Long term (current) use of aspirin: Secondary | ICD-10-CM | POA: Diagnosis not present

## 2014-05-13 DIAGNOSIS — I129 Hypertensive chronic kidney disease with stage 1 through stage 4 chronic kidney disease, or unspecified chronic kidney disease: Secondary | ICD-10-CM | POA: Insufficient documentation

## 2014-05-13 DIAGNOSIS — E118 Type 2 diabetes mellitus with unspecified complications: Secondary | ICD-10-CM | POA: Diagnosis not present

## 2014-05-13 DIAGNOSIS — E0789 Other specified disorders of thyroid: Secondary | ICD-10-CM | POA: Diagnosis not present

## 2014-05-13 DIAGNOSIS — I5022 Chronic systolic (congestive) heart failure: Secondary | ICD-10-CM | POA: Diagnosis not present

## 2014-05-13 NOTE — Patient Instructions (Signed)
Lab today  We will contact you in 3 months to schedule your next appointment.  
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
## 2014-05-15 ENCOUNTER — Telehealth (HOSPITAL_COMMUNITY): Payer: Self-pay | Admitting: Cardiology

## 2014-05-15 NOTE — Telephone Encounter (Signed)
Fax received from [facility] approved Ref # [id number] Valid 03/14/14-05/13/2015  Copy faxed to pts pharmacy [facility] [phone]

## 2014-05-20 DIAGNOSIS — E118 Type 2 diabetes mellitus with unspecified complications: Secondary | ICD-10-CM | POA: Diagnosis not present

## 2014-05-20 DIAGNOSIS — I10 Essential (primary) hypertension: Secondary | ICD-10-CM | POA: Diagnosis not present

## 2014-05-20 DIAGNOSIS — I509 Heart failure, unspecified: Secondary | ICD-10-CM | POA: Diagnosis not present

## 2014-05-21 DIAGNOSIS — Z09 Encounter for follow-up examination after completed treatment for conditions other than malignant neoplasm: Secondary | ICD-10-CM | POA: Diagnosis not present

## 2014-05-21 DIAGNOSIS — N63 Unspecified lump in breast: Secondary | ICD-10-CM | POA: Diagnosis not present

## 2014-05-22 ENCOUNTER — Other Ambulatory Visit (HOSPITAL_COMMUNITY): Payer: Self-pay | Admitting: Cardiology

## 2014-05-22 MED ORDER — CARVEDILOL 6.25 MG PO TABS: 6.2500 mg | ORAL_TABLET | Freq: Two times a day (BID) | ORAL | Status: DC

## 2014-07-30 ENCOUNTER — Other Ambulatory Visit (HOSPITAL_COMMUNITY): Payer: Self-pay | Admitting: Internal Medicine

## 2014-07-30 ENCOUNTER — Other Ambulatory Visit (HOSPITAL_COMMUNITY): Payer: Self-pay | Admitting: *Deleted

## 2014-07-30 DIAGNOSIS — I272 Pulmonary hypertension, unspecified: Secondary | ICD-10-CM

## 2014-07-30 MED ORDER — LOSARTAN POTASSIUM 100 MG PO TABS: 100.0000 mg | ORAL_TABLET | Freq: Every day | ORAL | Status: DC

## 2014-08-07 DIAGNOSIS — M25562 Pain in left knee: Secondary | ICD-10-CM | POA: Diagnosis not present

## 2014-08-07 DIAGNOSIS — M25561 Pain in right knee: Secondary | ICD-10-CM | POA: Diagnosis not present

## 2014-08-07 DIAGNOSIS — M170 Bilateral primary osteoarthritis of knee: Secondary | ICD-10-CM | POA: Diagnosis not present

## 2014-08-14 ENCOUNTER — Telehealth: Payer: Self-pay | Admitting: Pulmonary Disease

## 2014-08-14 NOTE — Telephone Encounter (Signed)
Called and spoke to [name]. Pt's oral appliance prescription form was faxed to have BQ sign and return.   [name] have you received this?

## 2014-08-15 NOTE — Telephone Encounter (Signed)
I do not have this order  

## 2014-08-15 NOTE — Telephone Encounter (Signed)
Called and spoke with Thayer Ohm, advised her that we have not received order.  She said she would fax again.  Awaiting fax.

## 2014-08-16 NOTE — Telephone Encounter (Signed)
Spoke with pt''s dentist, wanted to just let BQ know that he agrees with BQ's plan of care for pt, and that he would like to meet BQ at some point in the future.    Forwarding to BQ as fyi

## 2014-08-16 NOTE — Telephone Encounter (Signed)
Form received and in BQ's look at folder.    To [name] for follow up

## 2014-08-20 ENCOUNTER — Telehealth: Payer: Self-pay | Admitting: Pulmonary Disease

## 2014-08-20 DIAGNOSIS — E8770 Fluid overload, unspecified: Secondary | ICD-10-CM | POA: Diagnosis not present

## 2014-08-20 DIAGNOSIS — I10 Essential (primary) hypertension: Secondary | ICD-10-CM | POA: Diagnosis not present

## 2014-08-20 DIAGNOSIS — I272 Other secondary pulmonary hypertension: Secondary | ICD-10-CM | POA: Diagnosis not present

## 2014-08-20 DIAGNOSIS — N183 Chronic kidney disease, stage 3 (moderate): Secondary | ICD-10-CM | POA: Diagnosis not present

## 2014-08-20 NOTE — Telephone Encounter (Signed)
[facility]. [phone] Wants to make sure that we have gotten the fax. If we have that is great just fax it back when completed.

## 2014-08-20 NOTE — Telephone Encounter (Signed)
Called and spoke to [facility] and informed her pt will only be on CPAP and not dental appliance d/t severity of sleep apnea, per [name]. [facility] verbalized understanding and denied anything further needed.

## 2014-08-20 NOTE — Telephone Encounter (Signed)
Spoke with Dental office-caller is on another line at this time but co-worker who is aware of situation at hand will relay message to her. BQ back in office tomorrow afternoon and can advise if he is willing to reconsider signing form for oral appliance only. Pt has severe sleep apnea and they are aware that he may not reconsider signing as CPAP is the best option for patient and her overall health.   BQ please advise. Thanks.

## 2014-08-21 NOTE — Telephone Encounter (Signed)
If the patient wants to consider this then I need to refer her to one of my sleep colleagues.  Please find out if she wants this or not.  I do not feel strongly about it and think she could get by with CPAP alone.

## 2014-08-21 NOTE — Telephone Encounter (Signed)
lmtcb x1 for Grace Bradford.

## 2014-08-22 NOTE — Telephone Encounter (Signed)
Spoke with patient, aware of rec's per BQ Appt scheduled to see Dr Halford Chessman 10/10/14 at 11:45 (pt wishes to move forward with Dental Appliance) Will send to BQ as FYI.

## 2014-08-23 ENCOUNTER — Other Ambulatory Visit (HOSPITAL_COMMUNITY): Payer: Self-pay | Admitting: Cardiology

## 2014-08-23 DIAGNOSIS — I5022 Chronic systolic (congestive) heart failure: Secondary | ICD-10-CM

## 2014-08-23 MED ORDER — IVABRADINE HCL 5 MG PO TABS: 7.5000 mg | ORAL_TABLET | Freq: Two times a day (BID) | ORAL | Status: DC

## 2014-08-26 ENCOUNTER — Other Ambulatory Visit (HOSPITAL_COMMUNITY): Payer: Self-pay

## 2014-08-26 DIAGNOSIS — I5022 Chronic systolic (congestive) heart failure: Secondary | ICD-10-CM

## 2014-08-26 MED ORDER — IVABRADINE HCL 5 MG PO TABS: 7.5000 mg | ORAL_TABLET | Freq: Two times a day (BID) | ORAL | Status: DC

## 2014-09-03 DIAGNOSIS — N183 Chronic kidney disease, stage 3 (moderate): Secondary | ICD-10-CM | POA: Diagnosis not present

## 2014-09-13 DIAGNOSIS — M150 Primary generalized (osteo)arthritis: Secondary | ICD-10-CM | POA: Diagnosis not present

## 2014-09-13 DIAGNOSIS — M1A09X0 Idiopathic chronic gout, multiple sites, without tophus (tophi): Secondary | ICD-10-CM | POA: Diagnosis not present

## 2014-09-13 DIAGNOSIS — N184 Chronic kidney disease, stage 4 (severe): Secondary | ICD-10-CM | POA: Diagnosis not present

## 2014-09-13 DIAGNOSIS — M170 Bilateral primary osteoarthritis of knee: Secondary | ICD-10-CM | POA: Diagnosis not present

## 2014-09-18 ENCOUNTER — Telehealth: Payer: Self-pay | Admitting: Cardiology

## 2014-09-18 NOTE — Telephone Encounter (Signed)
New Prob   Pharmacist is calling regarding a drug interaction between Nitrostat and Sildenafil. Risk for hypotension. Please call.

## 2014-09-18 NOTE — Telephone Encounter (Signed)
Called and spoke with pharmacist and advised that after further review, Nitrostat is not listed on patient's medication list and I will forward to [facility] for further review by the physician or designated provider.

## 2014-09-19 NOTE — Telephone Encounter (Signed)
Will forward to provider for review.

## 2014-10-01 ENCOUNTER — Other Ambulatory Visit (HOSPITAL_COMMUNITY): Payer: Self-pay | Admitting: *Deleted

## 2014-10-01 MED ORDER — SILDENAFIL CITRATE 20 MG PO TABS: ORAL_TABLET | ORAL | Status: DC

## 2014-10-01 NOTE — Telephone Encounter (Signed)
Spoke w/Kristen at [employer], [facility] branch, advised ntg stat was d/c'd 11/07/13 she will note that on pt's chart

## 2014-10-10 ENCOUNTER — Ambulatory Visit (INDEPENDENT_AMBULATORY_CARE_PROVIDER_SITE_OTHER): Payer: Medicare Other | Admitting: Pulmonary Disease

## 2014-10-10 ENCOUNTER — Encounter: Payer: Self-pay | Admitting: Pulmonary Disease

## 2014-10-10 VITALS — BP 128/64 | HR 78 | Ht 62.5 in | Wt 244.6 lb

## 2014-10-10 DIAGNOSIS — G4733 Obstructive sleep apnea (adult) (pediatric): Secondary | ICD-10-CM | POA: Diagnosis not present

## 2014-10-10 DIAGNOSIS — J398 Other specified diseases of upper respiratory tract: Secondary | ICD-10-CM

## 2014-10-10 DIAGNOSIS — I272 Pulmonary hypertension, unspecified: Secondary | ICD-10-CM

## 2014-10-10 DIAGNOSIS — I270 Primary pulmonary hypertension: Secondary | ICD-10-CM

## 2014-10-10 DIAGNOSIS — I5022 Chronic systolic (congestive) heart failure: Secondary | ICD-10-CM

## 2014-10-10 NOTE — Patient Instructions (Signed)
Will arrange for referral to get oral appliance to treat sleep apnea Follow up in 4 months
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
## 2014-10-10 NOTE — Progress Notes (Deleted)
   Subjective:    Patient ID: Grace Bradford, female    DOB: January 08, 1946, 69 y.o.   MRN: [id number]  HPI    Review of Systems  Constitutional: Negative for fever and unexpected weight change.  HENT: Positive for congestion and sinus pressure. Negative for dental problem, ear pain, nosebleeds, postnasal drip, rhinorrhea, sneezing, sore throat and trouble swallowing.   Eyes: Negative for redness and itching.  Respiratory: Positive for shortness of breath and wheezing. Negative for cough and chest tightness.   Cardiovascular: Negative for palpitations and leg swelling.  Gastrointestinal: Negative for nausea and vomiting.  Genitourinary: Negative for dysuria.  Musculoskeletal: Negative for joint swelling.  Skin: Negative for rash.  Neurological: Positive for headaches.  Hematological: Does not bruise/bleed easily.  Psychiatric/Behavioral: Negative for dysphoric mood. The patient is not nervous/anxious.        Objective:   Physical Exam        Assessment & Plan:

## 2014-10-14 DIAGNOSIS — E118 Type 2 diabetes mellitus with unspecified complications: Secondary | ICD-10-CM | POA: Diagnosis not present

## 2014-10-14 DIAGNOSIS — N289 Disorder of kidney and ureter, unspecified: Secondary | ICD-10-CM | POA: Diagnosis not present

## 2014-10-14 DIAGNOSIS — G4730 Sleep apnea, unspecified: Secondary | ICD-10-CM | POA: Diagnosis not present

## 2014-10-14 DIAGNOSIS — R109 Unspecified abdominal pain: Secondary | ICD-10-CM | POA: Diagnosis not present

## 2014-10-15 ENCOUNTER — Other Ambulatory Visit: Payer: Self-pay | Admitting: Endocrinology

## 2014-10-15 DIAGNOSIS — N390 Urinary tract infection, site not specified: Secondary | ICD-10-CM | POA: Diagnosis not present

## 2014-10-15 DIAGNOSIS — R1084 Generalized abdominal pain: Secondary | ICD-10-CM

## 2014-10-15 DIAGNOSIS — R109 Unspecified abdominal pain: Secondary | ICD-10-CM | POA: Diagnosis not present

## 2014-10-17 ENCOUNTER — Emergency Department (HOSPITAL_COMMUNITY)
Admission: EM | Admit: 2014-10-17 | Discharge: 2014-10-18 | Disposition: A | Discharge status: Home / Self Care | Payer: Medicare Other | Attending: Emergency Medicine | Admitting: Emergency Medicine

## 2014-10-17 ENCOUNTER — Encounter (HOSPITAL_COMMUNITY): Payer: Self-pay | Admitting: *Deleted

## 2014-10-17 DIAGNOSIS — I129 Hypertensive chronic kidney disease with stage 1 through stage 4 chronic kidney disease, or unspecified chronic kidney disease: Secondary | ICD-10-CM | POA: Insufficient documentation

## 2014-10-17 DIAGNOSIS — K8020 Calculus of gallbladder without cholecystitis without obstruction: Secondary | ICD-10-CM | POA: Insufficient documentation

## 2014-10-17 DIAGNOSIS — G629 Polyneuropathy, unspecified: Secondary | ICD-10-CM | POA: Diagnosis not present

## 2014-10-17 DIAGNOSIS — K8050 Calculus of bile duct without cholangitis or cholecystitis without obstruction: Secondary | ICD-10-CM | POA: Diagnosis not present

## 2014-10-17 DIAGNOSIS — E119 Type 2 diabetes mellitus without complications: Secondary | ICD-10-CM | POA: Diagnosis not present

## 2014-10-17 DIAGNOSIS — Z7982 Long term (current) use of aspirin: Secondary | ICD-10-CM | POA: Insufficient documentation

## 2014-10-17 DIAGNOSIS — Z8673 Personal history of transient ischemic attack (TIA), and cerebral infarction without residual deficits: Secondary | ICD-10-CM | POA: Insufficient documentation

## 2014-10-17 DIAGNOSIS — N189 Chronic kidney disease, unspecified: Secondary | ICD-10-CM | POA: Diagnosis not present

## 2014-10-17 DIAGNOSIS — Z79899 Other long term (current) drug therapy: Secondary | ICD-10-CM | POA: Diagnosis not present

## 2014-10-17 DIAGNOSIS — E669 Obesity, unspecified: Secondary | ICD-10-CM | POA: Diagnosis not present

## 2014-10-17 DIAGNOSIS — Z87828 Personal history of other (healed) physical injury and trauma: Secondary | ICD-10-CM | POA: Insufficient documentation

## 2014-10-17 DIAGNOSIS — I252 Old myocardial infarction: Secondary | ICD-10-CM | POA: Insufficient documentation

## 2014-10-17 DIAGNOSIS — I509 Heart failure, unspecified: Secondary | ICD-10-CM | POA: Diagnosis not present

## 2014-10-17 DIAGNOSIS — N390 Urinary tract infection, site not specified: Secondary | ICD-10-CM | POA: Diagnosis not present

## 2014-10-17 DIAGNOSIS — R1011 Right upper quadrant pain: Secondary | ICD-10-CM | POA: Diagnosis present

## 2014-10-17 DIAGNOSIS — K219 Gastro-esophageal reflux disease without esophagitis: Secondary | ICD-10-CM | POA: Insufficient documentation

## 2014-10-17 DIAGNOSIS — Z7902 Long term (current) use of antithrombotics/antiplatelets: Secondary | ICD-10-CM | POA: Diagnosis not present

## 2014-10-17 DIAGNOSIS — R1013 Epigastric pain: Secondary | ICD-10-CM | POA: Diagnosis not present

## 2014-10-17 LAB — CBC WITH DIFFERENTIAL/PLATELET
BASOS ABS: 0 10*3/uL (ref 0.0–0.1)
Basophils Relative: 0 % (ref 0–1)
EOS PCT: 1 % (ref 0–5)
Eosinophils Absolute: 0.1 10*3/uL (ref 0.0–0.7)
HEMATOCRIT: 37.9 % (ref 36.0–46.0)
Hemoglobin: 12.4 g/dL (ref 12.0–15.0)
LYMPHS ABS: 2 10*3/uL (ref 0.7–4.0)
Lymphocytes Relative: 29 % (ref 12–46)
MCH: 28.1 pg (ref 26.0–34.0)
MCHC: 32.7 g/dL (ref 30.0–36.0)
MCV: 85.7 fL (ref 78.0–100.0)
Monocytes Absolute: 0.6 10*3/uL (ref 0.1–1.0)
Monocytes Relative: 9 % (ref 3–12)
NEUTROS ABS: 4.4 10*3/uL (ref 1.7–7.7)
Neutrophils Relative %: 61 % (ref 43–77)
PLATELETS: 223 10*3/uL (ref 150–400)
RBC: 4.42 MIL/uL (ref 3.87–5.11)
RDW: 14.9 % (ref 11.5–15.5)
WBC: 7.2 10*3/uL (ref 4.0–10.5)

## 2014-10-17 LAB — URINE MICROSCOPIC-ADD ON

## 2014-10-17 LAB — COMPREHENSIVE METABOLIC PANEL
ALK PHOS: 71 U/L (ref 38–126)
ALT: 15 U/L (ref 14–54)
ANION GAP: 12 (ref 5–15)
AST: 18 U/L (ref 15–41)
Albumin: 3.9 g/dL (ref 3.5–5.0)
BUN: 59 mg/dL — AB (ref 6–20)
CO2: 27 mmol/L (ref 22–32)
Calcium: 9.6 mg/dL (ref 8.9–10.3)
Chloride: 103 mmol/L (ref 101–111)
Creatinine, Ser: 2.08 mg/dL — ABNORMAL HIGH (ref 0.44–1.00)
GFR calc Af Amer: 27 mL/min — ABNORMAL LOW (ref >60)
GFR calc non Af Amer: 24 mL/min — ABNORMAL LOW (ref >60)
GLUCOSE: 159 mg/dL — AB (ref 65–99)
Potassium: 3.4 mmol/L — ABNORMAL LOW (ref 3.5–5.1)
Sodium: 142 mmol/L (ref 135–145)
Total Bilirubin: 0.4 mg/dL (ref 0.3–1.2)
Total Protein: 8.5 g/dL — ABNORMAL HIGH (ref 6.5–8.1)

## 2014-10-17 LAB — URINALYSIS, ROUTINE W REFLEX MICROSCOPIC
Bilirubin Urine: NEGATIVE
GLUCOSE, UA: NEGATIVE mg/dL
HGB URINE DIPSTICK: NEGATIVE
Ketones, ur: NEGATIVE mg/dL
Nitrite: NEGATIVE
Protein, ur: NEGATIVE mg/dL
Specific Gravity, Urine: 1.013 (ref 1.005–1.030)
Urobilinogen, UA: 0.2 mg/dL (ref 0.0–1.0)
pH: 5.5 (ref 5.0–8.0)

## 2014-10-17 LAB — LIPASE, BLOOD: Lipase: 146 U/L — ABNORMAL HIGH (ref 22–51)

## 2014-10-17 MED ORDER — ONDANSETRON 8 MG PO TBDP
8.0000 mg | ORAL_TABLET | Freq: Once | ORAL | Status: AC
  Administered 2014-10-17: 8 mg via ORAL
  Filled 2014-10-17: qty 1

## 2014-10-17 NOTE — ED Notes (Signed)
Pt states that she has had abd pain x 1 week; pt states that she saw her MD on Monday and he ordered blood work; pt advised that the MD stated that the bloodwork was fine; pt states that he scheduled her for a CT scan which is scheduled for tomorrow; pt states that she has not been able to keep anything down and that when she takes a couple of sips she is in severe pain

## 2014-10-18 ENCOUNTER — Other Ambulatory Visit: Payer: Medicare Other

## 2014-10-18 ENCOUNTER — Emergency Department (HOSPITAL_COMMUNITY): Payer: Medicare Other

## 2014-10-18 DIAGNOSIS — K8020 Calculus of gallbladder without cholecystitis without obstruction: Secondary | ICD-10-CM | POA: Diagnosis not present

## 2014-10-18 MED ORDER — OXYCODONE-ACETAMINOPHEN 5-325 MG PO TABS: 1.0000 | ORAL_TABLET | Freq: Four times a day (QID) | ORAL | Status: DC | PRN

## 2014-10-18 MED ORDER — ONDANSETRON HCL 4 MG/2ML IJ SOLN
4.0000 mg | Freq: Once | INTRAMUSCULAR | Status: AC
  Administered 2014-10-18: 4 mg via INTRAVENOUS
  Filled 2014-10-18: qty 2

## 2014-10-18 MED ORDER — OXYCODONE-ACETAMINOPHEN 5-325 MG PO TABS
1.0000 | ORAL_TABLET | Freq: Once | ORAL | Status: AC
  Administered 2014-10-18: 1 via ORAL
  Filled 2014-10-18: qty 1

## 2014-10-18 MED ORDER — ONDANSETRON 4 MG PO TBDP: 4.0000 mg | ORAL_TABLET | Freq: Three times a day (TID) | ORAL | Status: DC | PRN

## 2014-10-18 MED ORDER — CEPHALEXIN 500 MG PO CAPS
500.0000 mg | ORAL_CAPSULE | Freq: Two times a day (BID) | ORAL | Status: DC
Start: 2014-10-18 — End: 2014-11-25

## 2014-10-18 MED ORDER — MORPHINE SULFATE 4 MG/ML IJ SOLN
4.0000 mg | Freq: Once | INTRAMUSCULAR | Status: AC
  Administered 2014-10-18: 4 mg via INTRAVENOUS
  Filled 2014-10-18: qty 1

## 2014-10-18 MED ORDER — PANTOPRAZOLE SODIUM 40 MG IV SOLR
40.0000 mg | Freq: Once | INTRAVENOUS | Status: AC
  Administered 2014-10-18: 40 mg via INTRAVENOUS
  Filled 2014-10-18: qty 40
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
## 2014-10-18 NOTE — Discharge Instructions (Signed)
You were seen today and found to have gallstones. This is likely the cause of your pain. He should follow a low-fat diet. You'll be given follow-up with general surgery.  Biliary Colic  Biliary colic is a steady or irregular pain in the upper abdomen. It is usually under the right side of the rib cage. It happens when gallstones interfere with the normal flow of bile from the gallbladder. Bile is a liquid that helps to digest fats. Bile is made in the liver and stored in the gallbladder. When you eat a meal, bile passes from the gallbladder through the cystic duct and the common bile duct into the small intestine. There, it mixes with partially digested food. If a gallstone blocks either of these ducts, the normal flow of bile is blocked. The muscle cells in the bile duct contract forcefully to try to move the stone. This causes the pain of biliary colic.  SYMPTOMS   A person with biliary colic usually complains of pain in the upper abdomen. This pain can be:  In the center of the upper abdomen just below the breastbone.  In the upper-right part of the abdomen, near the gallbladder and liver.  Spread back toward the right shoulder blade.  Nausea and vomiting.  The pain usually occurs after eating.  Biliary colic is usually triggered by the digestive system's demand for bile. The demand for bile is high after fatty meals. Symptoms can also occur when a person who has been fasting suddenly eats a very large meal. Most episodes of biliary colic pass after 1 to 5 hours. After the most intense pain passes, your abdomen may continue to ache mildly for about 24 hours. DIAGNOSIS  After you describe your symptoms, your caregiver will perform a physical exam. He or she will pay attention to the upper right portion of your belly (abdomen). This is the area of your liver and gallbladder. An ultrasound will help your caregiver look for gallstones. Specialized scans of the gallbladder may also be done. Blood  tests may be done, especially if you have fever or if your pain persists. PREVENTION  Biliary colic can be prevented by controlling the risk factors for gallstones. Some of these risk factors, such as heredity, increasing age, and pregnancy are a normal part of life. Obesity and a high-fat diet are risk factors you can change through a healthy lifestyle. Women going through menopause who take hormone replacement therapy (estrogen) are also more likely to develop biliary colic. TREATMENT   Pain medication may be prescribed.  You may be encouraged to eat a fat-free diet.  If the first episode of biliary colic is severe, or episodes of colic keep retuning, surgery to remove the gallbladder (cholecystectomy) is usually recommended. This procedure can be done through small incisions using an instrument called a laparoscope. The procedure often requires a brief stay in the hospital. Some people can leave the hospital the same day. It is the most widely used treatment in people troubled by painful gallstones. It is effective and safe, with no complications in more than 90% of cases.  If surgery cannot be done, medication that dissolves gallstones may be used. This medication is expensive and can take months or years to work. Only small stones will dissolve.  Rarely, medication to dissolve gallstones is combined with a procedure called shock-wave lithotripsy. This procedure uses carefully aimed shock waves to break up gallstones. In many people treated with this procedure, gallstones form again within a few years. PROGNOSIS  If gallstones block your cystic duct or common bile duct, you are at risk for repeated episodes of biliary colic. There is also a 25% chance that you will develop a gallbladder infection(acute cholecystitis), or some other complication of gallstones within 10 to 20 years. If you have surgery, schedule it at a time that is convenient for you and at a time when you are not sick. HOME CARE  INSTRUCTIONS   Drink plenty of clear fluids.  Avoid fatty, greasy or fried foods, or any foods that make your pain worse.  Take medications as directed. SEEK MEDICAL CARE IF:   You develop a fever over 100.5 F (38.1 C).  Your pain gets worse over time.  You develop nausea that prevents you from eating and drinking.  You develop vomiting. SEEK IMMEDIATE MEDICAL CARE IF:   You have continuous or severe belly (abdominal) pain which is not relieved with medications.  You develop nausea and vomiting which is not relieved with medications.  You have symptoms of biliary colic and you suddenly develop a fever and shaking chills. This may signal cholecystitis. Call your caregiver immediately.  You develop a yellow color to your skin or the white part of your eyes (jaundice). Document Released: 09/13/2005 Document Revised: 07/05/2011 Document Reviewed: 11/23/2007 [facility] Patient Information 2015 [place], [place]. This information is not intended to replace advice given to you by your health care provider. Make sure you discuss any questions you have with your health care provider. Urinary Tract Infection Urinary tract infections (UTIs) can develop anywhere along your urinary tract. Your urinary tract is your body's drainage system for removing wastes and extra water. Your urinary tract includes two kidneys, two ureters, a bladder, and a urethra. Your kidneys are a pair of bean-shaped organs. Each kidney is about the size of your fist. They are located below your ribs, one on each side of your spine. CAUSES Infections are caused by microbes, which are microscopic organisms, including fungi, viruses, and bacteria. These organisms are so small that they can only be seen through a microscope. Bacteria are the microbes that most commonly cause UTIs. SYMPTOMS  Symptoms of UTIs may vary by age and gender of the patient and by the location of the infection. Symptoms in young women typically include a  frequent and intense urge to urinate and a painful, burning feeling in the bladder or urethra during urination. Older women and men are more likely to be tired, shaky, and weak and have muscle aches and abdominal pain. A fever may mean the infection is in your kidneys. Other symptoms of a kidney infection include pain in your back or sides below the ribs, nausea, and vomiting. DIAGNOSIS To diagnose a UTI, your caregiver will ask you about your symptoms. Your caregiver also will ask to provide a urine sample. The urine sample will be tested for bacteria and white blood cells. White blood cells are made by your body to help fight infection. TREATMENT  Typically, UTIs can be treated with medication. Because most UTIs are caused by a bacterial infection, they usually can be treated with the use of antibiotics. The choice of antibiotic and length of treatment depend on your symptoms and the type of bacteria causing your infection. HOME CARE INSTRUCTIONS  If you were prescribed antibiotics, take them exactly as your caregiver instructs you. Finish the medication even if you feel better after you have only taken some of the medication.  Drink enough water and fluids to keep your urine clear or  pale yellow.  Avoid caffeine, tea, and carbonated beverages. They tend to irritate your bladder.  Empty your bladder often. Avoid holding urine for long periods of time.  Empty your bladder before and after sexual intercourse.  After a bowel movement, women should cleanse from front to back. Use each tissue only once. SEEK MEDICAL CARE IF:   You have back pain.  You develop a fever.  Your symptoms do not begin to resolve within 3 days. SEEK IMMEDIATE MEDICAL CARE IF:   You have severe back pain or lower abdominal pain.  You develop chills.  You have nausea or vomiting.  You have continued burning or discomfort with urination. MAKE SURE YOU:   Understand these instructions.  Will watch your  condition.  Will get help right away if you are not doing well or get worse. Document Released: 01/20/2005 Document Revised: 10/12/2011 Document Reviewed: 05/21/2011 [facility] Patient Information 2015 [place], [place]. This information is not intended to replace advice given to you by your health care provider. Make sure you discuss any questions you have with your health care provider.

## 2014-10-19 LAB — URINE CULTURE

## 2014-10-21 DIAGNOSIS — E789 Disorder of lipoprotein metabolism, unspecified: Secondary | ICD-10-CM | POA: Diagnosis not present

## 2014-10-21 DIAGNOSIS — R1084 Generalized abdominal pain: Secondary | ICD-10-CM | POA: Diagnosis not present

## 2014-10-21 DIAGNOSIS — E118 Type 2 diabetes mellitus with unspecified complications: Secondary | ICD-10-CM | POA: Diagnosis not present

## 2014-10-21 DIAGNOSIS — K8020 Calculus of gallbladder without cholecystitis without obstruction: Secondary | ICD-10-CM | POA: Diagnosis not present

## 2014-10-24 ENCOUNTER — Telehealth (HOSPITAL_COMMUNITY): Payer: Self-pay | Admitting: Vascular Surgery

## 2014-10-24 NOTE — Telephone Encounter (Signed)
Per Dr Haroldine Laws pt needs to be seen before she can be cleared, Arbutus Leas can please work her in

## 2014-10-24 NOTE — Telephone Encounter (Addendum)
Pt called she has an appt 11/18/14 for a F/U and clearance for Gallbladder surgery, pt wants to know if she can be cleared w/ out seeing the doctor or get her appt moved up because her appt w/ surgeon is 11/05/14.Marland Kitchen Please advise pt also wasnts to know about her pre auth for Sildenafil

## 2014-11-05 ENCOUNTER — Other Ambulatory Visit: Payer: Self-pay | Admitting: General Surgery

## 2014-11-05 DIAGNOSIS — N183 Chronic kidney disease, stage 3 (moderate): Secondary | ICD-10-CM | POA: Diagnosis not present

## 2014-11-05 DIAGNOSIS — Z90710 Acquired absence of both cervix and uterus: Secondary | ICD-10-CM | POA: Diagnosis not present

## 2014-11-05 DIAGNOSIS — Z90721 Acquired absence of ovaries, unilateral: Secondary | ICD-10-CM | POA: Diagnosis not present

## 2014-11-05 DIAGNOSIS — K8020 Calculus of gallbladder without cholecystitis without obstruction: Secondary | ICD-10-CM | POA: Diagnosis not present

## 2014-11-05 DIAGNOSIS — E0840 Diabetes mellitus due to underlying condition with diabetic neuropathy, unspecified: Secondary | ICD-10-CM | POA: Diagnosis not present

## 2014-11-05 DIAGNOSIS — Z8673 Personal history of transient ischemic attack (TIA), and cerebral infarction without residual deficits: Secondary | ICD-10-CM | POA: Diagnosis not present

## 2014-11-05 DIAGNOSIS — J398 Other specified diseases of upper respiratory tract: Secondary | ICD-10-CM | POA: Diagnosis not present

## 2014-11-05 DIAGNOSIS — Z9851 Tubal ligation status: Secondary | ICD-10-CM | POA: Diagnosis not present

## 2014-11-05 DIAGNOSIS — I10 Essential (primary) hypertension: Secondary | ICD-10-CM | POA: Diagnosis not present

## 2014-11-05 DIAGNOSIS — I5022 Chronic systolic (congestive) heart failure: Secondary | ICD-10-CM | POA: Diagnosis not present

## 2014-11-05 DIAGNOSIS — I252 Old myocardial infarction: Secondary | ICD-10-CM | POA: Diagnosis not present

## 2014-11-05 NOTE — Addendum Note (Signed)
Addended by: Adin Hector on: 11/05/2014 12:10 PM   Modules accepted: Orders

## 2014-11-18 ENCOUNTER — Encounter (HOSPITAL_COMMUNITY): Payer: Self-pay

## 2014-11-18 ENCOUNTER — Ambulatory Visit (HOSPITAL_COMMUNITY)
Admission: RE | Admit: 2014-11-18 | Discharge: 2014-11-18 | Disposition: A | Discharge status: Home / Self Care | Payer: Medicare Other | Source: Ambulatory Visit | Attending: Internal Medicine | Admitting: Internal Medicine

## 2014-11-18 ENCOUNTER — Other Ambulatory Visit (HOSPITAL_COMMUNITY): Payer: Self-pay

## 2014-11-18 VITALS — BP 114/66 | HR 65 | Wt 242.5 lb

## 2014-11-18 DIAGNOSIS — I5022 Chronic systolic (congestive) heart failure: Secondary | ICD-10-CM | POA: Diagnosis not present
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
## 2014-11-18 NOTE — Patient Instructions (Signed)
You have been scheduled for a heart cath Tuesday August 2nd. See instruction sheet for additional details.  Will schedule you for an echocardiogram at [facility]. Address: [address], [place], [place] [address]  Phone: [phone]  Follow up 2 weeks.  Do the following things EVERYDAY: 1) Weigh yourself in the morning before breakfast. Write it down and keep it in a log. 2) Take your medicines as prescribed 3) Eat low salt foods-Limit salt (sodium) to 2000 mg per day.  4) Stay as active as you can everyday 5) Limit all fluids for the day to less than 2 liters

## 2014-11-19 ENCOUNTER — Encounter (HOSPITAL_COMMUNITY): Payer: Self-pay

## 2014-11-19 NOTE — Progress Notes (Signed)
Update faxed to [facility] [place] [facility] on behalf of patient.  Patient currently awaiting gallbladder surgery clearance from our office, however, Dr. Haroldine Laws would like to further evaluate patient with echo and right heart catheterization in the next week before clearance made.  Faxed these appointemnts to their office with our follow up appointment.  Will let them know of clearance at her next apt 12/04/14 in our office.  Renee Pain

## 2014-11-20 ENCOUNTER — Telehealth: Payer: Self-pay

## 2014-11-20 NOTE — Telephone Encounter (Signed)
Prior auth for Sildenafil sent to [facility] via Cover My Meds.

## 2014-11-21 ENCOUNTER — Other Ambulatory Visit (HOSPITAL_COMMUNITY): Payer: Medicare Other

## 2014-11-22 ENCOUNTER — Telehealth (HOSPITAL_COMMUNITY): Payer: Self-pay | Admitting: Cardiology

## 2014-11-22 ENCOUNTER — Ambulatory Visit (HOSPITAL_COMMUNITY): Payer: Medicare Other | Attending: Cardiology

## 2014-11-22 ENCOUNTER — Other Ambulatory Visit: Payer: Self-pay

## 2014-11-22 DIAGNOSIS — I10 Essential (primary) hypertension: Secondary | ICD-10-CM | POA: Insufficient documentation

## 2014-11-22 DIAGNOSIS — E785 Hyperlipidemia, unspecified: Secondary | ICD-10-CM | POA: Insufficient documentation

## 2014-11-22 DIAGNOSIS — I071 Rheumatic tricuspid insufficiency: Secondary | ICD-10-CM | POA: Insufficient documentation

## 2014-11-22 DIAGNOSIS — I517 Cardiomegaly: Secondary | ICD-10-CM | POA: Diagnosis not present

## 2014-11-22 DIAGNOSIS — E119 Type 2 diabetes mellitus without complications: Secondary | ICD-10-CM | POA: Diagnosis not present

## 2014-11-22 DIAGNOSIS — I371 Nonrheumatic pulmonary valve insufficiency: Secondary | ICD-10-CM | POA: Insufficient documentation

## 2014-11-22 DIAGNOSIS — I5022 Chronic systolic (congestive) heart failure: Secondary | ICD-10-CM

## 2014-11-22 DIAGNOSIS — I5189 Other ill-defined heart diseases: Secondary | ICD-10-CM | POA: Diagnosis not present

## 2014-11-22 NOTE — Telephone Encounter (Signed)
Pt scheduled for RHC with Dr.Bensimhon 11/26/14 Cpt [id number] With pts current insurance 1. Medicare 2.Ward 2. Tricare No pre cert reqired

## 2014-11-25 ENCOUNTER — Telehealth: Payer: Self-pay

## 2014-11-25 NOTE — Telephone Encounter (Signed)
Approval for Sildenafil 20mg  good through 04/25/2098. Ref [id number].

## 2014-11-26 ENCOUNTER — Ambulatory Visit (HOSPITAL_COMMUNITY)
Admission: RE | Admit: 2014-11-26 | Discharge: 2014-11-26 | Disposition: A | Discharge status: Home / Self Care | Payer: Medicare Other | Source: Ambulatory Visit | Attending: Internal Medicine | Admitting: Internal Medicine

## 2014-11-26 ENCOUNTER — Encounter (HOSPITAL_COMMUNITY): Payer: Self-pay | Admitting: Internal Medicine

## 2014-11-26 ENCOUNTER — Encounter (HOSPITAL_COMMUNITY)
Admission: RE | Disposition: A | Discharge status: Home / Self Care | Payer: Self-pay | Source: Ambulatory Visit | Attending: Internal Medicine

## 2014-11-26 DIAGNOSIS — G4733 Obstructive sleep apnea (adult) (pediatric): Secondary | ICD-10-CM | POA: Insufficient documentation

## 2014-11-26 DIAGNOSIS — I5022 Chronic systolic (congestive) heart failure: Secondary | ICD-10-CM | POA: Insufficient documentation

## 2014-11-26 DIAGNOSIS — E1122 Type 2 diabetes mellitus with diabetic chronic kidney disease: Secondary | ICD-10-CM | POA: Insufficient documentation

## 2014-11-26 DIAGNOSIS — Z7982 Long term (current) use of aspirin: Secondary | ICD-10-CM | POA: Insufficient documentation

## 2014-11-26 DIAGNOSIS — I272 Other secondary pulmonary hypertension: Secondary | ICD-10-CM | POA: Diagnosis not present

## 2014-11-26 DIAGNOSIS — I2510 Atherosclerotic heart disease of native coronary artery without angina pectoris: Secondary | ICD-10-CM | POA: Insufficient documentation

## 2014-11-26 DIAGNOSIS — E669 Obesity, unspecified: Secondary | ICD-10-CM | POA: Insufficient documentation

## 2014-11-26 DIAGNOSIS — I252 Old myocardial infarction: Secondary | ICD-10-CM | POA: Insufficient documentation

## 2014-11-26 DIAGNOSIS — Z8673 Personal history of transient ischemic attack (TIA), and cerebral infarction without residual deficits: Secondary | ICD-10-CM | POA: Insufficient documentation

## 2014-11-26 DIAGNOSIS — E1140 Type 2 diabetes mellitus with diabetic neuropathy, unspecified: Secondary | ICD-10-CM | POA: Diagnosis not present

## 2014-11-26 DIAGNOSIS — K8020 Calculus of gallbladder without cholecystitis without obstruction: Secondary | ICD-10-CM | POA: Diagnosis not present

## 2014-11-26 DIAGNOSIS — K219 Gastro-esophageal reflux disease without esophagitis: Secondary | ICD-10-CM | POA: Insufficient documentation

## 2014-11-26 DIAGNOSIS — I2721 Secondary pulmonary arterial hypertension: Secondary | ICD-10-CM | POA: Insufficient documentation

## 2014-11-26 DIAGNOSIS — K449 Diaphragmatic hernia without obstruction or gangrene: Secondary | ICD-10-CM | POA: Insufficient documentation

## 2014-11-26 DIAGNOSIS — I429 Cardiomyopathy, unspecified: Secondary | ICD-10-CM | POA: Insufficient documentation

## 2014-11-26 DIAGNOSIS — I2582 Chronic total occlusion of coronary artery: Secondary | ICD-10-CM | POA: Diagnosis not present

## 2014-11-26 DIAGNOSIS — I129 Hypertensive chronic kidney disease with stage 1 through stage 4 chronic kidney disease, or unspecified chronic kidney disease: Secondary | ICD-10-CM | POA: Diagnosis not present

## 2014-11-26 DIAGNOSIS — N189 Chronic kidney disease, unspecified: Secondary | ICD-10-CM | POA: Insufficient documentation

## 2014-11-26 DIAGNOSIS — Z7902 Long term (current) use of antithrombotics/antiplatelets: Secondary | ICD-10-CM | POA: Insufficient documentation

## 2014-11-26 DIAGNOSIS — Z8249 Family history of ischemic heart disease and other diseases of the circulatory system: Secondary | ICD-10-CM | POA: Diagnosis not present

## 2014-11-26 HISTORY — PX: CARDIAC CATHETERIZATION: SHX172

## 2014-11-26 LAB — CBC
HCT: 36.7 % (ref 36.0–46.0)
HEMOGLOBIN: 11.4 g/dL — AB (ref 12.0–15.0)
MCH: 27.5 pg (ref 26.0–34.0)
MCHC: 31.1 g/dL (ref 30.0–36.0)
MCV: 88.6 fL (ref 78.0–100.0)
Platelets: 189 10*3/uL (ref 150–400)
RBC: 4.14 MIL/uL (ref 3.87–5.11)
RDW: 14.5 % (ref 11.5–15.5)
WBC: 6.2 10*3/uL (ref 4.0–10.5)

## 2014-11-26 LAB — BASIC METABOLIC PANEL
Anion gap: 11 (ref 5–15)
BUN: 54 mg/dL — ABNORMAL HIGH (ref 6–20)
CO2: 28 mmol/L (ref 22–32)
Calcium: 8.3 mg/dL — ABNORMAL LOW (ref 8.9–10.3)
Chloride: 101 mmol/L (ref 101–111)
Creatinine, Ser: 2.42 mg/dL — ABNORMAL HIGH (ref 0.44–1.00)
GFR calc non Af Amer: 20 mL/min — ABNORMAL LOW (ref >60)
GFR, EST AFRICAN AMERICAN: 23 mL/min — AB (ref >60)
Glucose, Bld: 136 mg/dL — ABNORMAL HIGH (ref 65–99)
Potassium: 3.8 mmol/L (ref 3.5–5.1)
Sodium: 140 mmol/L (ref 135–145)

## 2014-11-26 LAB — GLUCOSE, CAPILLARY: Glucose-Capillary: 137 mg/dL — ABNORMAL HIGH (ref 65–99)

## 2014-11-26 LAB — PROTIME-INR
INR: 1.1 (ref 0.00–1.49)
PROTHROMBIN TIME: 14.4 s (ref 11.6–15.2)

## 2014-11-26 LAB — PLATELET INHIBITION P2Y12: Platelet Function  P2Y12: 210 [PRU] (ref 194–418)

## 2014-11-26 SURGERY — RIGHT HEART CATH

## 2014-11-26 MED ORDER — SODIUM CHLORIDE 0.9 % IJ SOLN: 3.0000 mL | INTRAMUSCULAR | Status: DC | PRN

## 2014-11-26 MED ORDER — SODIUM CHLORIDE 0.9 % IV SOLN: 250.0000 mL | INTRAVENOUS | Status: DC | PRN

## 2014-11-26 MED ORDER — SODIUM CHLORIDE 0.9 % IJ SOLN: 3.0000 mL | Freq: Two times a day (BID) | INTRAMUSCULAR | Status: DC

## 2014-11-26 MED ORDER — MIDAZOLAM HCL 2 MG/2ML IJ SOLN
INTRAMUSCULAR | Status: DC | PRN
  Administered 2014-11-26: 1 mg via INTRAVENOUS

## 2014-11-26 MED ORDER — FENTANYL CITRATE (PF) 100 MCG/2ML IJ SOLN
INTRAMUSCULAR | Status: AC
  Filled 2014-11-26: qty 4

## 2014-11-26 MED ORDER — LIDOCAINE HCL (PF) 1 % IJ SOLN
INTRAMUSCULAR | Status: AC
  Filled 2014-11-26: qty 30

## 2014-11-26 MED ORDER — ONDANSETRON HCL 4 MG/2ML IJ SOLN: 4.0000 mg | Freq: Four times a day (QID) | INTRAMUSCULAR | Status: DC | PRN

## 2014-11-26 MED ORDER — HEPARIN (PORCINE) IN NACL 2-0.9 UNIT/ML-% IJ SOLN
INTRAMUSCULAR | Status: AC
  Filled 2014-11-26: qty 500

## 2014-11-26 MED ORDER — ACETAMINOPHEN 325 MG PO TABS: 650.0000 mg | ORAL_TABLET | ORAL | Status: DC | PRN

## 2014-11-26 MED ORDER — SODIUM CHLORIDE 0.9 % IV SOLN
INTRAVENOUS | Status: DC
  Administered 2014-11-26: 08:00:00 via INTRAVENOUS

## 2014-11-26 MED ORDER — MIDAZOLAM HCL 2 MG/2ML IJ SOLN
INTRAMUSCULAR | Status: AC
  Filled 2014-11-26: qty 4

## 2014-11-26 MED ORDER — FENTANYL CITRATE (PF) 100 MCG/2ML IJ SOLN
INTRAMUSCULAR | Status: DC | PRN
Start: 2014-11-26 — End: 2014-11-26
  Administered 2014-11-26: 25 ug via INTRAVENOUS

## 2014-11-26 MED ORDER — LIDOCAINE HCL (PF) 1 % IJ SOLN
INTRAMUSCULAR | Status: DC | PRN
  Administered 2014-11-26: 09:00:00

## 2014-11-26 SURGICAL SUPPLY — 5 items
CATH SWAN GANZ 7F STRAIGHT (CATHETERS) ×1 IMPLANT
KIT HEART RIGHT NAMIC (KITS) ×2 IMPLANT
PACK CARDIAC CATHETERIZATION (CUSTOM PROCEDURE TRAY) ×2 IMPLANT
SHEATH PINNACLE 7F 10CM (SHEATH) ×1 IMPLANT
TRANSDUCER W/STOPCOCK (MISCELLANEOUS) ×4 IMPLANT

## 2014-11-26 NOTE — Interval H&P Note (Signed)
History and Physical Interval Note:  11/26/2014 8:39 AM  Grace Bradford  has presented today for surgery, with the diagnosis of hf and PAH, The various methods of treatment have been discussed with the patient and family. After consideration of risks, benefits and other options for treatment, the patient has consented to  Procedure(s): Right Heart Cath (N/A) as a surgical intervention .  The patient's history has been reviewed, patient examined, no change in status, stable for surgery.  I have reviewed the patient's chart and labs.  Questions were answered to the patient's satisfaction.     Tavious Griesinger, Quillian Quince
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
## 2014-11-26 NOTE — Discharge Instructions (Signed)
Call Dr if any problems,questions,or concerns; call if right neck site has redness,drainage,fever,pain,swelling, or bleeding

## 2014-11-26 NOTE — Progress Notes (Signed)
Discharge in one hour

## 2014-11-27 ENCOUNTER — Other Ambulatory Visit: Payer: Self-pay | Admitting: General Surgery

## 2014-11-27 ENCOUNTER — Other Ambulatory Visit (HOSPITAL_COMMUNITY): Payer: Self-pay

## 2014-11-27 DIAGNOSIS — I5022 Chronic systolic (congestive) heart failure: Secondary | ICD-10-CM

## 2014-11-27 DIAGNOSIS — I252 Old myocardial infarction: Secondary | ICD-10-CM | POA: Diagnosis not present

## 2014-11-27 DIAGNOSIS — K8020 Calculus of gallbladder without cholecystitis without obstruction: Secondary | ICD-10-CM | POA: Diagnosis not present

## 2014-11-27 DIAGNOSIS — N183 Chronic kidney disease, stage 3 (moderate): Secondary | ICD-10-CM | POA: Diagnosis not present

## 2014-11-27 DIAGNOSIS — Z9851 Tubal ligation status: Secondary | ICD-10-CM | POA: Diagnosis not present

## 2014-11-27 DIAGNOSIS — E0840 Diabetes mellitus due to underlying condition with diabetic neuropathy, unspecified: Secondary | ICD-10-CM | POA: Diagnosis not present

## 2014-11-27 DIAGNOSIS — J398 Other specified diseases of upper respiratory tract: Secondary | ICD-10-CM | POA: Diagnosis not present

## 2014-11-27 DIAGNOSIS — Z8673 Personal history of transient ischemic attack (TIA), and cerebral infarction without residual deficits: Secondary | ICD-10-CM | POA: Diagnosis not present

## 2014-11-27 LAB — POCT I-STAT 3, VENOUS BLOOD GAS (G3P V)
Acid-Base Excess: 1 mmol/L (ref 0.0–2.0)
Acid-Base Excess: 2 mmol/L (ref 0.0–2.0)
BICARBONATE: 28.4 meq/L — AB (ref 20.0–24.0)
BICARBONATE: 29.2 meq/L — AB (ref 20.0–24.0)
O2 SAT: 72 %
O2 Saturation: 70 %
PCO2 VEN: 56.3 mmHg — AB (ref 45.0–50.0)
PCO2 VEN: 57.8 mmHg — AB (ref 45.0–50.0)
TCO2: 30 mmol/L (ref 0–100)
TCO2: 31 mmol/L (ref 0–100)
pH, Ven: 7.311 — ABNORMAL HIGH (ref 7.250–7.300)
pH, Ven: 7.311 — ABNORMAL HIGH (ref 7.250–7.300)
pO2, Ven: 41 mmHg (ref 30.0–45.0)
pO2, Ven: 43 mmHg (ref 30.0–45.0)

## 2014-11-28 ENCOUNTER — Other Ambulatory Visit: Payer: Self-pay | Admitting: General Surgery

## 2014-11-28 DIAGNOSIS — K8020 Calculus of gallbladder without cholecystitis without obstruction: Secondary | ICD-10-CM

## 2014-11-28 NOTE — Addendum Note (Signed)
Addended by: Adin Hector on: 11/28/2014 12:25 PM   Modules accepted: Orders

## 2014-12-02 ENCOUNTER — Encounter (HOSPITAL_COMMUNITY): Payer: Medicare Other

## 2014-12-04 ENCOUNTER — Inpatient Hospital Stay (HOSPITAL_COMMUNITY): Admission: RE | Admit: 2014-12-04 | Payer: Medicare Other | Source: Ambulatory Visit

## 2014-12-06 ENCOUNTER — Ambulatory Visit (HOSPITAL_COMMUNITY)
Admission: RE | Admit: 2014-12-06 | Discharge: 2014-12-06 | Disposition: A | Discharge status: Home / Self Care | Payer: Medicare Other | Source: Ambulatory Visit | Attending: General Surgery | Admitting: General Surgery

## 2014-12-06 ENCOUNTER — Other Ambulatory Visit: Payer: Self-pay

## 2014-12-06 ENCOUNTER — Encounter (HOSPITAL_COMMUNITY)
Admission: RE | Admit: 2014-12-06 | Discharge: 2014-12-06 | Disposition: A | Discharge status: Home / Self Care | Payer: Medicare Other | Source: Ambulatory Visit | Attending: General Surgery | Admitting: General Surgery

## 2014-12-06 DIAGNOSIS — G8929 Other chronic pain: Secondary | ICD-10-CM | POA: Diagnosis not present

## 2014-12-06 DIAGNOSIS — R1011 Right upper quadrant pain: Secondary | ICD-10-CM | POA: Insufficient documentation

## 2014-12-06 DIAGNOSIS — K8020 Calculus of gallbladder without cholecystitis without obstruction: Secondary | ICD-10-CM

## 2014-12-06 MED ORDER — SINCALIDE 5 MCG IJ SOLR
INTRAMUSCULAR | Status: AC
  Filled 2014-12-06: qty 5

## 2014-12-06 MED ORDER — STERILE WATER FOR INJECTION IJ SOLN
INTRAMUSCULAR | Status: AC
  Filled 2014-12-06: qty 10

## 2014-12-06 MED ORDER — TECHNETIUM TC 99M MEBROFENIN IV KIT
5.0000 | PACK | Freq: Once | INTRAVENOUS | Status: DC | PRN
  Administered 2014-12-06: 5 via INTRAVENOUS
  Filled 2014-12-06: qty 6

## 2014-12-18 DIAGNOSIS — M150 Primary generalized (osteo)arthritis: Secondary | ICD-10-CM | POA: Diagnosis not present

## 2014-12-18 DIAGNOSIS — M170 Bilateral primary osteoarthritis of knee: Secondary | ICD-10-CM | POA: Diagnosis not present

## 2014-12-18 DIAGNOSIS — N184 Chronic kidney disease, stage 4 (severe): Secondary | ICD-10-CM | POA: Diagnosis not present

## 2014-12-18 DIAGNOSIS — M1A09X0 Idiopathic chronic gout, multiple sites, without tophus (tophi): Secondary | ICD-10-CM | POA: Diagnosis not present

## 2014-12-19 ENCOUNTER — Encounter (HOSPITAL_COMMUNITY): Payer: Medicare Other

## 2015-01-02 ENCOUNTER — Other Ambulatory Visit: Payer: Self-pay | Admitting: Cardiology

## 2015-01-10 DIAGNOSIS — I10 Essential (primary) hypertension: Secondary | ICD-10-CM | POA: Diagnosis not present

## 2015-01-10 DIAGNOSIS — E118 Type 2 diabetes mellitus with unspecified complications: Secondary | ICD-10-CM | POA: Diagnosis not present

## 2015-01-13 DIAGNOSIS — I509 Heart failure, unspecified: Secondary | ICD-10-CM | POA: Diagnosis not present

## 2015-01-13 DIAGNOSIS — R109 Unspecified abdominal pain: Secondary | ICD-10-CM | POA: Diagnosis not present

## 2015-01-13 DIAGNOSIS — N289 Disorder of kidney and ureter, unspecified: Secondary | ICD-10-CM | POA: Diagnosis not present

## 2015-01-20 ENCOUNTER — Other Ambulatory Visit (HOSPITAL_COMMUNITY): Payer: Self-pay | Admitting: *Deleted

## 2015-01-20 DIAGNOSIS — I5022 Chronic systolic (congestive) heart failure: Secondary | ICD-10-CM

## 2015-01-20 MED ORDER — IVABRADINE HCL 5 MG PO TABS
7.5000 mg | ORAL_TABLET | Freq: Two times a day (BID) | ORAL | Status: DC
Start: 2015-01-20 — End: 2015-07-11

## 2015-02-04 ENCOUNTER — Other Ambulatory Visit: Payer: Self-pay | Admitting: Cardiology

## 2015-02-04 MED ORDER — SILDENAFIL CITRATE 20 MG PO TABS: ORAL_TABLET | ORAL | Status: DC

## 2015-02-12 ENCOUNTER — Other Ambulatory Visit (HOSPITAL_COMMUNITY): Payer: Self-pay | Admitting: *Deleted

## 2015-02-12 MED ORDER — METOLAZONE 2.5 MG PO TABS: 2.5000 mg | ORAL_TABLET | Freq: Every day | ORAL | Status: DC | PRN

## 2015-02-17 DIAGNOSIS — M542 Cervicalgia: Secondary | ICD-10-CM | POA: Diagnosis not present

## 2015-02-17 DIAGNOSIS — E118 Type 2 diabetes mellitus with unspecified complications: Secondary | ICD-10-CM | POA: Diagnosis not present

## 2015-02-17 DIAGNOSIS — R05 Cough: Secondary | ICD-10-CM | POA: Diagnosis not present

## 2015-02-17 DIAGNOSIS — M47812 Spondylosis without myelopathy or radiculopathy, cervical region: Secondary | ICD-10-CM | POA: Diagnosis not present

## 2015-02-19 DIAGNOSIS — M549 Dorsalgia, unspecified: Secondary | ICD-10-CM | POA: Diagnosis not present

## 2015-02-19 DIAGNOSIS — E039 Hypothyroidism, unspecified: Secondary | ICD-10-CM | POA: Diagnosis not present

## 2015-02-19 DIAGNOSIS — R05 Cough: Secondary | ICD-10-CM | POA: Diagnosis not present

## 2015-02-19 DIAGNOSIS — M542 Cervicalgia: Secondary | ICD-10-CM | POA: Diagnosis not present

## 2015-02-25 ENCOUNTER — Other Ambulatory Visit (HOSPITAL_COMMUNITY): Payer: Self-pay | Admitting: *Deleted

## 2015-02-25 MED ORDER — CARVEDILOL 6.25 MG PO TABS: 6.2500 mg | ORAL_TABLET | Freq: Two times a day (BID) | ORAL | Status: DC

## 2015-03-03 ENCOUNTER — Other Ambulatory Visit (HOSPITAL_COMMUNITY): Payer: Self-pay | Admitting: *Deleted

## 2015-03-03 MED ORDER — SPIRONOLACTONE 25 MG PO TABS: 25.0000 mg | ORAL_TABLET | Freq: Two times a day (BID) | ORAL | Status: DC

## 2015-03-07 DIAGNOSIS — E118 Type 2 diabetes mellitus with unspecified complications: Secondary | ICD-10-CM | POA: Diagnosis not present

## 2015-03-07 DIAGNOSIS — R05 Cough: Secondary | ICD-10-CM | POA: Diagnosis not present

## 2015-03-07 DIAGNOSIS — R609 Edema, unspecified: Secondary | ICD-10-CM | POA: Diagnosis not present

## 2015-03-13 DIAGNOSIS — I10 Essential (primary) hypertension: Secondary | ICD-10-CM | POA: Diagnosis not present

## 2015-03-13 DIAGNOSIS — I272 Other secondary pulmonary hypertension: Secondary | ICD-10-CM | POA: Diagnosis not present

## 2015-03-13 DIAGNOSIS — E8770 Fluid overload, unspecified: Secondary | ICD-10-CM | POA: Diagnosis not present

## 2015-03-13 DIAGNOSIS — N183 Chronic kidney disease, stage 3 (moderate): Secondary | ICD-10-CM | POA: Diagnosis not present

## 2015-03-13 DIAGNOSIS — G4730 Sleep apnea, unspecified: Secondary | ICD-10-CM | POA: Diagnosis not present

## 2015-03-17 DIAGNOSIS — M170 Bilateral primary osteoarthritis of knee: Secondary | ICD-10-CM | POA: Diagnosis not present

## 2015-04-22 DIAGNOSIS — J069 Acute upper respiratory infection, unspecified: Secondary | ICD-10-CM | POA: Diagnosis not present

## 2015-04-23 ENCOUNTER — Telehealth (HOSPITAL_COMMUNITY): Payer: Self-pay | Admitting: *Deleted

## 2015-04-23 MED ORDER — SILDENAFIL CITRATE 20 MG PO TABS: ORAL_TABLET | ORAL | Status: DC

## 2015-04-23 NOTE — Telephone Encounter (Signed)
Pt called to let us know she received a letter stating she can not use [facility] with her insurance anymore, she must use [facility] or [facility].  Advised there is a Education officer, museum, rx for Sildenafil sent to them, also provided pt with their phone number [phone]) she will call to get enrolled with them.

## 2015-05-01 ENCOUNTER — Telehealth (HOSPITAL_COMMUNITY): Payer: Self-pay

## 2015-05-01 NOTE — Telephone Encounter (Signed)
Patient called to confirm Sildenafil had been ordered/sent. Per Nira Conn RN note, Rx was sent to [facility] on 04/23/2015. Reminded that she must call to enroll and provided her with their phone number [phone]). Patient has no further questions regarding this.  Renee Pain

## 2015-05-14 DIAGNOSIS — E118 Type 2 diabetes mellitus with unspecified complications: Secondary | ICD-10-CM | POA: Diagnosis not present

## 2015-05-14 DIAGNOSIS — E789 Disorder of lipoprotein metabolism, unspecified: Secondary | ICD-10-CM | POA: Diagnosis not present

## 2015-05-14 DIAGNOSIS — Z79899 Other long term (current) drug therapy: Secondary | ICD-10-CM | POA: Diagnosis not present

## 2015-05-19 DIAGNOSIS — H35031 Hypertensive retinopathy, right eye: Secondary | ICD-10-CM | POA: Diagnosis not present

## 2015-05-19 DIAGNOSIS — H2513 Age-related nuclear cataract, bilateral: Secondary | ICD-10-CM | POA: Diagnosis not present

## 2015-05-19 DIAGNOSIS — H35032 Hypertensive retinopathy, left eye: Secondary | ICD-10-CM | POA: Diagnosis not present

## 2015-05-19 DIAGNOSIS — H524 Presbyopia: Secondary | ICD-10-CM | POA: Diagnosis not present

## 2015-05-21 DIAGNOSIS — R05 Cough: Secondary | ICD-10-CM | POA: Diagnosis not present

## 2015-05-21 DIAGNOSIS — N390 Urinary tract infection, site not specified: Secondary | ICD-10-CM | POA: Diagnosis not present

## 2015-05-21 DIAGNOSIS — I509 Heart failure, unspecified: Secondary | ICD-10-CM | POA: Diagnosis not present

## 2015-05-21 DIAGNOSIS — E118 Type 2 diabetes mellitus with unspecified complications: Secondary | ICD-10-CM | POA: Diagnosis not present

## 2015-06-02 DIAGNOSIS — R05 Cough: Secondary | ICD-10-CM | POA: Diagnosis not present

## 2015-06-02 DIAGNOSIS — M549 Dorsalgia, unspecified: Secondary | ICD-10-CM | POA: Diagnosis not present

## 2015-06-02 DIAGNOSIS — N390 Urinary tract infection, site not specified: Secondary | ICD-10-CM | POA: Diagnosis not present

## 2015-06-03 ENCOUNTER — Telehealth (HOSPITAL_COMMUNITY): Payer: Self-pay | Admitting: Pharmacist

## 2015-06-03 NOTE — Telephone Encounter (Signed)
Corlanor PA approved by [employer] FEP through 06/01/16. [facility] verified copay of $0/mo.   Ruta Hinds. Velva Harman, PharmD, BCPS, CPP Clinical Pharmacist Pager: [phone] Phone: [phone] 06/03/2015 11:01 AM

## 2015-06-06 DIAGNOSIS — Z1231 Encounter for screening mammogram for malignant neoplasm of breast: Secondary | ICD-10-CM | POA: Diagnosis not present

## 2015-06-09 DIAGNOSIS — Z0000 Encounter for general adult medical examination without abnormal findings: Secondary | ICD-10-CM | POA: Diagnosis not present

## 2015-06-09 DIAGNOSIS — E118 Type 2 diabetes mellitus with unspecified complications: Secondary | ICD-10-CM | POA: Diagnosis not present

## 2015-06-09 DIAGNOSIS — E0789 Other specified disorders of thyroid: Secondary | ICD-10-CM | POA: Diagnosis not present

## 2015-06-09 DIAGNOSIS — E789 Disorder of lipoprotein metabolism, unspecified: Secondary | ICD-10-CM | POA: Diagnosis not present

## 2015-06-10 DIAGNOSIS — E118 Type 2 diabetes mellitus with unspecified complications: Secondary | ICD-10-CM | POA: Diagnosis not present

## 2015-06-10 DIAGNOSIS — E032 Hypothyroidism due to medicaments and other exogenous substances: Secondary | ICD-10-CM | POA: Diagnosis not present

## 2015-06-10 DIAGNOSIS — I509 Heart failure, unspecified: Secondary | ICD-10-CM | POA: Diagnosis not present

## 2015-06-10 DIAGNOSIS — E789 Disorder of lipoprotein metabolism, unspecified: Secondary | ICD-10-CM | POA: Diagnosis not present

## 2015-06-12 ENCOUNTER — Other Ambulatory Visit (HOSPITAL_COMMUNITY): Payer: Self-pay | Admitting: *Deleted

## 2015-06-12 MED ORDER — TORSEMIDE 20 MG PO TABS: 40.0000 mg | ORAL_TABLET | Freq: Two times a day (BID) | ORAL | Status: DC

## 2015-06-16 ENCOUNTER — Telehealth (HOSPITAL_COMMUNITY): Payer: Self-pay | Admitting: Pharmacist

## 2015-06-16 NOTE — Telephone Encounter (Signed)
Corlanor PA approved by [employer] program [facility].   Ruta Hinds. Velva Harman, PharmD, BCPS, CPP Clinical Pharmacist Pager: [phone] Phone: [phone] 06/16/2015 2:49 PM

## 2015-06-19 ENCOUNTER — Other Ambulatory Visit (HOSPITAL_COMMUNITY): Payer: Self-pay | Admitting: *Deleted

## 2015-06-19 ENCOUNTER — Emergency Department (HOSPITAL_COMMUNITY)
Admission: EM | Admit: 2015-06-19 | Discharge: 2015-06-19 | Disposition: A | Discharge status: Home / Self Care | Payer: No Typology Code available for payment source | Attending: Emergency Medicine | Admitting: Emergency Medicine

## 2015-06-19 ENCOUNTER — Encounter (HOSPITAL_COMMUNITY): Payer: Self-pay | Admitting: *Deleted

## 2015-06-19 DIAGNOSIS — Z79899 Other long term (current) drug therapy: Secondary | ICD-10-CM | POA: Insufficient documentation

## 2015-06-19 DIAGNOSIS — Y92510 Bank as the place of occurrence of the external cause: Secondary | ICD-10-CM | POA: Insufficient documentation

## 2015-06-19 DIAGNOSIS — N189 Chronic kidney disease, unspecified: Secondary | ICD-10-CM | POA: Insufficient documentation

## 2015-06-19 DIAGNOSIS — E669 Obesity, unspecified: Secondary | ICD-10-CM | POA: Insufficient documentation

## 2015-06-19 DIAGNOSIS — Z9889 Other specified postprocedural states: Secondary | ICD-10-CM | POA: Insufficient documentation

## 2015-06-19 DIAGNOSIS — K219 Gastro-esophageal reflux disease without esophagitis: Secondary | ICD-10-CM | POA: Insufficient documentation

## 2015-06-19 DIAGNOSIS — Z8673 Personal history of transient ischemic attack (TIA), and cerebral infarction without residual deficits: Secondary | ICD-10-CM | POA: Diagnosis not present

## 2015-06-19 DIAGNOSIS — Y998 Other external cause status: Secondary | ICD-10-CM | POA: Insufficient documentation

## 2015-06-19 DIAGNOSIS — E119 Type 2 diabetes mellitus without complications: Secondary | ICD-10-CM | POA: Diagnosis not present

## 2015-06-19 DIAGNOSIS — I252 Old myocardial infarction: Secondary | ICD-10-CM | POA: Diagnosis not present

## 2015-06-19 DIAGNOSIS — I509 Heart failure, unspecified: Secondary | ICD-10-CM | POA: Diagnosis not present

## 2015-06-19 DIAGNOSIS — G629 Polyneuropathy, unspecified: Secondary | ICD-10-CM | POA: Diagnosis not present

## 2015-06-19 DIAGNOSIS — M62830 Muscle spasm of back: Secondary | ICD-10-CM

## 2015-06-19 DIAGNOSIS — Z7902 Long term (current) use of antithrombotics/antiplatelets: Secondary | ICD-10-CM | POA: Insufficient documentation

## 2015-06-19 DIAGNOSIS — S29002A Unspecified injury of muscle and tendon of back wall of thorax, initial encounter: Secondary | ICD-10-CM | POA: Diagnosis present

## 2015-06-19 DIAGNOSIS — Z7982 Long term (current) use of aspirin: Secondary | ICD-10-CM | POA: Diagnosis not present

## 2015-06-19 DIAGNOSIS — M546 Pain in thoracic spine: Secondary | ICD-10-CM

## 2015-06-19 DIAGNOSIS — I129 Hypertensive chronic kidney disease with stage 1 through stage 4 chronic kidney disease, or unspecified chronic kidney disease: Secondary | ICD-10-CM | POA: Insufficient documentation

## 2015-06-19 DIAGNOSIS — Y9389 Activity, other specified: Secondary | ICD-10-CM | POA: Diagnosis not present

## 2015-06-19 DIAGNOSIS — Z794 Long term (current) use of insulin: Secondary | ICD-10-CM | POA: Insufficient documentation

## 2015-06-19 DIAGNOSIS — M5489 Other dorsalgia: Secondary | ICD-10-CM | POA: Diagnosis not present

## 2015-06-19 MED ORDER — ACETAMINOPHEN 325 MG PO TABS: 650.0000 mg | ORAL_TABLET | Freq: Four times a day (QID) | ORAL | Status: DC | PRN

## 2015-06-19 MED ORDER — IBUPROFEN 600 MG PO TABS: 600.0000 mg | ORAL_TABLET | Freq: Four times a day (QID) | ORAL | Status: DC | PRN

## 2015-06-19 MED ORDER — IBUPROFEN 200 MG PO TABS
600.0000 mg | ORAL_TABLET | Freq: Once | ORAL | Status: AC
  Administered 2015-06-19: 600 mg via ORAL
  Filled 2015-06-19: qty 3

## 2015-06-19 MED ORDER — ACETAMINOPHEN 325 MG PO TABS
650.0000 mg | ORAL_TABLET | Freq: Once | ORAL | Status: AC
  Administered 2015-06-19: 650 mg via ORAL
  Filled 2015-06-19: qty 2
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
## 2015-06-19 NOTE — Progress Notes (Signed)
Pt rates her back pain now 5/10. Pt stated the medication has worked.
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
## 2015-06-20 ENCOUNTER — Telehealth (HOSPITAL_COMMUNITY): Payer: Self-pay | Admitting: Pharmacist

## 2015-06-20 ENCOUNTER — Ambulatory Visit (HOSPITAL_COMMUNITY)
Admission: RE | Admit: 2015-06-20 | Discharge: 2015-06-20 | Disposition: A | Discharge status: Home / Self Care | Payer: Medicare Other | Source: Ambulatory Visit | Attending: Internal Medicine | Admitting: Internal Medicine

## 2015-06-20 VITALS — BP 142/78 | HR 84 | Wt 244.0 lb

## 2015-06-20 DIAGNOSIS — Z79899 Other long term (current) drug therapy: Secondary | ICD-10-CM | POA: Insufficient documentation

## 2015-06-20 DIAGNOSIS — K219 Gastro-esophageal reflux disease without esophagitis: Secondary | ICD-10-CM | POA: Insufficient documentation

## 2015-06-20 DIAGNOSIS — I2510 Atherosclerotic heart disease of native coronary artery without angina pectoris: Secondary | ICD-10-CM | POA: Insufficient documentation

## 2015-06-20 DIAGNOSIS — I272 Other secondary pulmonary hypertension: Secondary | ICD-10-CM | POA: Diagnosis not present

## 2015-06-20 DIAGNOSIS — E669 Obesity, unspecified: Secondary | ICD-10-CM | POA: Insufficient documentation

## 2015-06-20 DIAGNOSIS — Z794 Long term (current) use of insulin: Secondary | ICD-10-CM | POA: Insufficient documentation

## 2015-06-20 DIAGNOSIS — Z7902 Long term (current) use of antithrombotics/antiplatelets: Secondary | ICD-10-CM | POA: Diagnosis not present

## 2015-06-20 DIAGNOSIS — I428 Other cardiomyopathies: Secondary | ICD-10-CM | POA: Insufficient documentation

## 2015-06-20 DIAGNOSIS — G4733 Obstructive sleep apnea (adult) (pediatric): Secondary | ICD-10-CM | POA: Diagnosis not present

## 2015-06-20 DIAGNOSIS — N183 Chronic kidney disease, stage 3 (moderate): Secondary | ICD-10-CM | POA: Diagnosis not present

## 2015-06-20 DIAGNOSIS — I130 Hypertensive heart and chronic kidney disease with heart failure and stage 1 through stage 4 chronic kidney disease, or unspecified chronic kidney disease: Secondary | ICD-10-CM | POA: Diagnosis not present

## 2015-06-20 DIAGNOSIS — I252 Old myocardial infarction: Secondary | ICD-10-CM | POA: Diagnosis not present

## 2015-06-20 DIAGNOSIS — E1122 Type 2 diabetes mellitus with diabetic chronic kidney disease: Secondary | ICD-10-CM | POA: Insufficient documentation

## 2015-06-20 DIAGNOSIS — Z8249 Family history of ischemic heart disease and other diseases of the circulatory system: Secondary | ICD-10-CM | POA: Diagnosis not present

## 2015-06-20 DIAGNOSIS — Z7982 Long term (current) use of aspirin: Secondary | ICD-10-CM | POA: Insufficient documentation

## 2015-06-20 DIAGNOSIS — I5022 Chronic systolic (congestive) heart failure: Secondary | ICD-10-CM | POA: Insufficient documentation

## 2015-06-20 DIAGNOSIS — Z8673 Personal history of transient ischemic attack (TIA), and cerebral infarction without residual deficits: Secondary | ICD-10-CM | POA: Diagnosis not present

## 2015-06-20 LAB — BASIC METABOLIC PANEL
Anion gap: 12 (ref 5–15)
BUN: 71 mg/dL — AB (ref 6–20)
CHLORIDE: 107 mmol/L (ref 101–111)
CO2: 25 mmol/L (ref 22–32)
CREATININE: 2.4 mg/dL — AB (ref 0.44–1.00)
Calcium: 8.8 mg/dL — ABNORMAL LOW (ref 8.9–10.3)
GFR calc Af Amer: 23 mL/min — ABNORMAL LOW (ref >60)
GFR calc non Af Amer: 20 mL/min — ABNORMAL LOW (ref >60)
Glucose, Bld: 133 mg/dL — ABNORMAL HIGH (ref 65–99)
POTASSIUM: 3.8 mmol/L (ref 3.5–5.1)
Sodium: 144 mmol/L (ref 135–145)

## 2015-06-20 MED ORDER — SACUBITRIL-VALSARTAN 97-103 MG PO TABS: 1.0000 | ORAL_TABLET | Freq: Two times a day (BID) | ORAL | Status: DC

## 2015-06-20 NOTE — Addendum Note (Signed)
Encounter addended by: Scarlette Calico, RN on: 06/20/2015 12:25 PM<BR>     Documentation filed: Medications, Dx Association, Patient Instructions Section, Orders

## 2015-06-20 NOTE — Telephone Encounter (Signed)
Entresto PA approved through 04/25/2098.   Ruta Hinds. Velva Harman, PharmD, BCPS, CPP Clinical Pharmacist Pager: [phone] Phone: [phone] 06/20/2015 4:35 PM

## 2015-06-20 NOTE — Patient Instructions (Signed)
Stop Losartan  Start Entresto 97/103 mg Twice daily   Lab today  Labs in 1-2 weeks  We will contact you in 4 months to schedule your next appointment.

## 2015-06-20 NOTE — Progress Notes (Signed)
Advanced Heart Failure Medication Review by a Pharmacist  Does the patient  feel that his/her medications are working for him/her?  yes  Has the patient been experiencing any side effects to the medications prescribed?  no  Does the patient measure his/her own blood pressure or blood glucose at home?  yes   Does the patient have any problems obtaining medications due to transportation or finances?   no  Understanding of regimen: good Understanding of indications: good Potential of compliance: good Patient understands to avoid NSAIDs. Patient understands to avoid decongestants.  Issues to address at subsequent visits: None   Pharmacist comments:  Grace Bradford is a pleasant 70 yo F presenting with her daughter and a medication list. She reports good compliance with her regimen and did not have any specific medication-related questions or concerns for me at this time. She does state that she has been able to get Revatio through [employer].   Grace Bradford, PharmD, BCPS, CPP Clinical Pharmacist Pager: [phone] Phone: [phone] 06/20/2015 12:07 PM      Time with patient: 10 minutes Preparation and documentation time: 2 minutes Total time: 12 minutes
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
## 2015-06-24 ENCOUNTER — Other Ambulatory Visit (HOSPITAL_COMMUNITY): Payer: Self-pay | Admitting: *Deleted

## 2015-06-27 ENCOUNTER — Ambulatory Visit (INDEPENDENT_AMBULATORY_CARE_PROVIDER_SITE_OTHER): Payer: Medicare Other | Admitting: Pulmonary Disease

## 2015-06-27 ENCOUNTER — Encounter: Payer: Self-pay | Admitting: Pulmonary Disease

## 2015-06-27 ENCOUNTER — Encounter (INDEPENDENT_AMBULATORY_CARE_PROVIDER_SITE_OTHER): Payer: Self-pay

## 2015-06-27 VITALS — BP 134/66 | HR 55 | Ht 62.5 in | Wt 245.0 lb

## 2015-06-27 DIAGNOSIS — G4733 Obstructive sleep apnea (adult) (pediatric): Secondary | ICD-10-CM | POA: Diagnosis not present

## 2015-06-27 DIAGNOSIS — J398 Other specified diseases of upper respiratory tract: Secondary | ICD-10-CM

## 2015-06-27 DIAGNOSIS — I2510 Atherosclerotic heart disease of native coronary artery without angina pectoris: Secondary | ICD-10-CM

## 2015-06-27 NOTE — Patient Instructions (Signed)
Will arrange for overnight oxygen test while wearing oral appliance  Follow up in 1 year

## 2015-06-27 NOTE — Progress Notes (Signed)
Current Outpatient Prescriptions on File Prior to Visit  Medication Sig  . acetaminophen (TYLENOL) 500 MG tablet Take 1,000 mg by mouth every 6 (six) hours as needed for mild pain or moderate pain.   Marland Kitchen aspirin EC 81 MG tablet Take 81 mg by mouth daily.  . benzonatate (TESSALON) 200 MG capsule Take 200 mg by mouth 3 (three) times daily as needed for cough.  . Calcium Carb-Cholecalciferol (CALCIUM 600 + D PO) Take 1 tablet by mouth daily.  . carvedilol (COREG) 6.25 MG tablet Take 1 tablet (6.25 mg total) by mouth 2 (two) times daily with a meal.  . clopidogrel (PLAVIX) 75 MG tablet Take 75 mg by mouth daily.  . diazepam (VALIUM) 5 MG tablet Take 5 mg by mouth 2 (two) times daily as needed for muscle spasms.   . diclofenac sodium (VOLTAREN) 1 % GEL Apply 2 g topically 4 (four) times daily as needed (pain).   . febuxostat (ULORIC) 40 MG tablet Take 40 mg by mouth daily.  Marland Kitchen guaiFENesin (MUCINEX) 600 MG 12 hr tablet Take 1,200 mg by mouth 2 (two) times daily.   Marland Kitchen HYDROcodone-acetaminophen (NORCO) 7.5-325 MG per tablet Take 1 tablet by mouth every 6 (six) hours as needed for moderate pain.  . hydrOXYzine (ATARAX/VISTARIL) 25 MG tablet Take 25 mg by mouth every 6 (six) hours as needed for itching.  Marland Kitchen ibuprofen (ADVIL,MOTRIN) 600 MG tablet Take 600 mg by mouth every 6 (six) hours as needed for moderate pain.  . Insulin Detemir (LEVEMIR FLEXPEN) 100 UNIT/ML Pen Take 60 units in the am and 80 units in the pm  . ivabradine (CORLANOR) 5 MG TABS tablet Take 1.5 tablets (7.5 mg total) by mouth 2 (two) times daily with a meal.  . levothyroxine (SYNTHROID, LEVOTHROID) 125 MCG tablet Take 125 mcg by mouth daily.  Marland Kitchen lidocaine (XYLOCAINE) 5 % ointment Apply 1 application topically 3 (three) times daily as needed (itching).   . Liraglutide (VICTOZA) 18 MG/3ML SOPN Inject 1.8 mg into the skin at bedtime.  Marland Kitchen loratadine (CLARITIN) 10 MG tablet Take 10 mg by mouth daily as needed (seasonal allergies).   . metaxalone  (SKELAXIN) 800 MG tablet Take 800 mg by mouth 3 (three) times daily as needed for muscle spasms.  . metolazone (ZAROXOLYN) 2.5 MG tablet Take 1 tablet (2.5 mg total) by mouth daily as needed (for weight gain of 3-5 lbs).  . Multiple Vitamin (MULTIVITAMIN WITH MINERALS) TABS tablet Take 1 tablet by mouth daily.  . nitroGLYCERIN (NITRO-BID) 2 % ointment Apply 0.5 inches topically 2 (two) times daily as needed (wound healing).   Marland Kitchen omeprazole (PRILOSEC) 20 MG capsule Take 1 capsule (20 mg total) by mouth 2 (two) times daily.  Earney Navy Bicarbonate (ZEGERID) 20-1100 MG CAPS capsule Take 1 capsule by mouth daily.  . ondansetron (ZOFRAN ODT) 4 MG disintegrating tablet Take 1 tablet (4 mg total) by mouth every 8 (eight) hours as needed for nausea or vomiting.  . potassium chloride SA (K-DUR,KLOR-CON) 20 MEQ tablet Take 1 tablet (20 mEq total) by mouth daily.  . pravastatin (PRAVACHOL) 80 MG tablet Take 80 mg by mouth Daily.   . pregabalin (LYRICA) 75 MG capsule Take 75 mg by mouth 2 (two) times daily.  . Probiotic Product (PROBIOTIC PO) Take 1 capsule by mouth daily.  . promethazine (PHENERGAN) 25 MG tablet Take 25 mg by mouth every 6 (six) hours as needed for nausea.   . ranitidine (ZANTAC) 150 MG tablet Take 150 mg by  mouth daily at 10 pm.   . sacubitril-valsartan (ENTRESTO) 97-103 MG Take 1 tablet by mouth 2 (two) times daily.  . sildenafil (REVATIO) 20 MG tablet TAKE 3 TABLETS (60MG ) BY MOUTH THREE TIMES A DAY.  Marland Kitchen spironolactone (ALDACTONE) 25 MG tablet Take 1 tablet (25 mg total) by mouth 2 (two) times daily.  Marland Kitchen tolterodine (DETROL LA) 4 MG 24 hr capsule Take 8 mg by mouth daily.   No current facility-administered medications on file prior to visit.     Chief Complaint  Patient presents with  . Follow-up    9 month OSA follow up - reports sleep is doing well since last ov. using an oral appliance     Tests PFT 09/24/13 >> FEV1 1.29 (73%), FEV1% 83, TLC 5.10 (107%), DLCO 27%,  +BD CT chest 09/27/13 >> severe tracheomalacia, mild air trapping b/l PSG 11/25/13 >> AHI 15, SaO2 low 89% Echo 11/22/14 >> mild LVH, EF [id number], grade 2 diastolic dysfx RHC [id number] >> RA 13, RV 73/8/14, PA 68/18/42, PCW 18, CI 3.2, PVR 3.6 WU  Past medical hx DM, non ischemic CM, combined CHF, CAD, HTN, HLD, CKD  Past surgical hx, Allergies, Family hx, Social hx all reviewed.  Vital Signs BP 134/66 mmHg  Pulse 55  Ht 5' 2.5" (1.588 m)  Wt 245 lb (111.131 kg)  BMI 44.07 kg/m2  SpO2 96%  History of Present Illness Grace Bradford is a 70 y.o. female with obstructive sleep apnea, and tracheomalacia.  Since her last visit she was started on oral appliance.  She feels she is sleeping better, and more alert during the day.  She is not having dyspnea or chest congestion.      Physical Exam  General - No distress ENT - No sinus tenderness, no oral exudate, no LAN Cardiac - s1s2 regular, no murmur Chest - No wheeze/rales/dullness Back - No focal tenderness Abd - Soft, non-tender Ext - No edema Neuro - Normal strength Skin - No rashes Psych - normal mood, and behavior   Assessment/Plan  Obstructive sleep apnea in he setting tracheomalacia. She declined PAP therapy, and reports symptomatic benefit from oral appliance. Plan: - will arrange for ONO with oral appliance   Pulmonary hypertension. Plan: - she is revatio per cardiology   Patient Instructions  Will arrange for overnight oxygen test while wearing oral appliance  Follow up in 1 year     Chesley Mires, MD [place] Pager:  [phone]

## 2015-06-30 ENCOUNTER — Other Ambulatory Visit (HOSPITAL_COMMUNITY): Payer: Self-pay | Admitting: *Deleted

## 2015-06-30 MED ORDER — TORSEMIDE 20 MG PO TABS: 40.0000 mg | ORAL_TABLET | Freq: Two times a day (BID) | ORAL | Status: DC

## 2015-07-04 ENCOUNTER — Ambulatory Visit (HOSPITAL_COMMUNITY)
Admission: RE | Admit: 2015-07-04 | Discharge: 2015-07-04 | Disposition: A | Discharge status: Home / Self Care | Payer: Medicare Other | Source: Ambulatory Visit | Attending: Internal Medicine | Admitting: Internal Medicine

## 2015-07-04 DIAGNOSIS — I5022 Chronic systolic (congestive) heart failure: Secondary | ICD-10-CM | POA: Diagnosis not present

## 2015-07-04 LAB — BASIC METABOLIC PANEL
Anion gap: 14 (ref 5–15)
BUN: 72 mg/dL — AB (ref 6–20)
CHLORIDE: 100 mmol/L — AB (ref 101–111)
CO2: 23 mmol/L (ref 22–32)
CREATININE: 2.53 mg/dL — AB (ref 0.44–1.00)
Calcium: 8.6 mg/dL — ABNORMAL LOW (ref 8.9–10.3)
GFR calc Af Amer: 21 mL/min — ABNORMAL LOW (ref >60)
GFR, EST NON AFRICAN AMERICAN: 18 mL/min — AB (ref >60)
Glucose, Bld: 122 mg/dL — ABNORMAL HIGH (ref 65–99)
Potassium: 3.9 mmol/L (ref 3.5–5.1)
SODIUM: 137 mmol/L (ref 135–145)

## 2015-07-07 DIAGNOSIS — G4733 Obstructive sleep apnea (adult) (pediatric): Secondary | ICD-10-CM | POA: Diagnosis not present

## 2015-07-08 ENCOUNTER — Other Ambulatory Visit (HOSPITAL_COMMUNITY): Payer: Self-pay | Admitting: *Deleted

## 2015-07-11 ENCOUNTER — Telehealth (HOSPITAL_COMMUNITY): Payer: Self-pay | Admitting: Cardiology

## 2015-07-11 DIAGNOSIS — I5022 Chronic systolic (congestive) heart failure: Secondary | ICD-10-CM

## 2015-07-11 MED ORDER — IVABRADINE HCL 5 MG PO TABS: 7.5000 mg | ORAL_TABLET | Freq: Two times a day (BID) | ORAL | Status: DC

## 2015-07-11 NOTE — Telephone Encounter (Signed)
Patient called to request refill on corlanor

## 2015-08-04 ENCOUNTER — Telehealth: Payer: Self-pay | Admitting: Pulmonary Disease

## 2015-08-04 NOTE — Telephone Encounter (Signed)
ONO with oral appliance 07/07/15 >> test time 9 hrs 16 min.  Baseline SpO2 94%, low SpO2 86%.  Spent 1 min 36 sec with SpO2 < 88%.   Will have my nurse inform pt that ONO looked okay.  She should continue using oral appliance for sleep apnea at night while asleep.

## 2015-08-06 NOTE — Telephone Encounter (Signed)
LM x 1 

## 2015-08-07 NOTE — Telephone Encounter (Signed)
Called and spoke with pt. Reviewed results and recs. Pt voiced understanding and had no further questions. Nothing further needed.

## 2015-08-07 NOTE — Telephone Encounter (Signed)
Pt returning call.Stanley A Dalton ° °

## 2015-08-12 ENCOUNTER — Other Ambulatory Visit (HOSPITAL_COMMUNITY): Payer: Self-pay | Admitting: *Deleted

## 2015-08-12 MED ORDER — METOLAZONE 2.5 MG PO TABS: 2.5000 mg | ORAL_TABLET | Freq: Every day | ORAL | Status: DC | PRN

## 2015-08-18 ENCOUNTER — Other Ambulatory Visit (HOSPITAL_COMMUNITY): Payer: Self-pay | Admitting: *Deleted

## 2015-08-18 MED ORDER — SPIRONOLACTONE 25 MG PO TABS: 25.0000 mg | ORAL_TABLET | Freq: Two times a day (BID) | ORAL | Status: DC

## 2015-08-27 DIAGNOSIS — N390 Urinary tract infection, site not specified: Secondary | ICD-10-CM | POA: Diagnosis not present

## 2015-09-11 ENCOUNTER — Encounter: Payer: Self-pay | Admitting: Pulmonary Disease

## 2015-09-15 DIAGNOSIS — N184 Chronic kidney disease, stage 4 (severe): Secondary | ICD-10-CM | POA: Diagnosis not present

## 2015-09-15 DIAGNOSIS — M170 Bilateral primary osteoarthritis of knee: Secondary | ICD-10-CM | POA: Diagnosis not present

## 2015-09-15 DIAGNOSIS — M1A09X0 Idiopathic chronic gout, multiple sites, without tophus (tophi): Secondary | ICD-10-CM | POA: Diagnosis not present

## 2015-10-06 ENCOUNTER — Encounter: Payer: Self-pay | Admitting: Internal Medicine

## 2015-10-08 DIAGNOSIS — N183 Chronic kidney disease, stage 3 (moderate): Secondary | ICD-10-CM | POA: Diagnosis not present

## 2015-10-08 DIAGNOSIS — I272 Other secondary pulmonary hypertension: Secondary | ICD-10-CM | POA: Diagnosis not present

## 2015-10-08 DIAGNOSIS — E8770 Fluid overload, unspecified: Secondary | ICD-10-CM | POA: Diagnosis not present

## 2015-10-08 DIAGNOSIS — I10 Essential (primary) hypertension: Secondary | ICD-10-CM | POA: Diagnosis not present

## 2015-10-08 DIAGNOSIS — G4730 Sleep apnea, unspecified: Secondary | ICD-10-CM | POA: Diagnosis not present

## 2015-10-17 DIAGNOSIS — R05 Cough: Secondary | ICD-10-CM | POA: Diagnosis not present

## 2015-10-17 DIAGNOSIS — E118 Type 2 diabetes mellitus with unspecified complications: Secondary | ICD-10-CM | POA: Diagnosis not present

## 2015-10-17 DIAGNOSIS — E032 Hypothyroidism due to medicaments and other exogenous substances: Secondary | ICD-10-CM | POA: Diagnosis not present

## 2015-10-20 DIAGNOSIS — R05 Cough: Secondary | ICD-10-CM | POA: Diagnosis not present

## 2015-10-22 ENCOUNTER — Encounter (HOSPITAL_COMMUNITY): Payer: Self-pay | Admitting: Internal Medicine

## 2015-10-22 ENCOUNTER — Ambulatory Visit (HOSPITAL_COMMUNITY)
Admission: RE | Admit: 2015-10-22 | Discharge: 2015-10-22 | Disposition: A | Discharge status: Home / Self Care | Payer: Medicare Other | Source: Ambulatory Visit | Attending: Internal Medicine | Admitting: Internal Medicine

## 2015-10-22 VITALS — BP 128/78 | HR 68 | Ht 62.5 in | Wt 241.0 lb

## 2015-10-22 DIAGNOSIS — M2550 Pain in unspecified joint: Secondary | ICD-10-CM | POA: Insufficient documentation

## 2015-10-22 DIAGNOSIS — I5020 Unspecified systolic (congestive) heart failure: Secondary | ICD-10-CM | POA: Diagnosis not present

## 2015-10-22 DIAGNOSIS — I429 Cardiomyopathy, unspecified: Secondary | ICD-10-CM | POA: Insufficient documentation

## 2015-10-22 DIAGNOSIS — R51 Headache: Secondary | ICD-10-CM | POA: Diagnosis not present

## 2015-10-22 DIAGNOSIS — I2510 Atherosclerotic heart disease of native coronary artery without angina pectoris: Secondary | ICD-10-CM | POA: Insufficient documentation

## 2015-10-22 DIAGNOSIS — K449 Diaphragmatic hernia without obstruction or gangrene: Secondary | ICD-10-CM | POA: Diagnosis not present

## 2015-10-22 DIAGNOSIS — Z7982 Long term (current) use of aspirin: Secondary | ICD-10-CM | POA: Diagnosis not present

## 2015-10-22 DIAGNOSIS — K648 Other hemorrhoids: Secondary | ICD-10-CM | POA: Diagnosis not present

## 2015-10-22 DIAGNOSIS — I252 Old myocardial infarction: Secondary | ICD-10-CM | POA: Insufficient documentation

## 2015-10-22 DIAGNOSIS — I130 Hypertensive heart and chronic kidney disease with heart failure and stage 1 through stage 4 chronic kidney disease, or unspecified chronic kidney disease: Secondary | ICD-10-CM | POA: Diagnosis not present

## 2015-10-22 DIAGNOSIS — G4733 Obstructive sleep apnea (adult) (pediatric): Secondary | ICD-10-CM | POA: Insufficient documentation

## 2015-10-22 DIAGNOSIS — E669 Obesity, unspecified: Secondary | ICD-10-CM | POA: Insufficient documentation

## 2015-10-22 DIAGNOSIS — Z881 Allergy status to other antibiotic agents status: Secondary | ICD-10-CM | POA: Insufficient documentation

## 2015-10-22 DIAGNOSIS — I272 Other secondary pulmonary hypertension: Secondary | ICD-10-CM

## 2015-10-22 DIAGNOSIS — I5022 Chronic systolic (congestive) heart failure: Secondary | ICD-10-CM | POA: Diagnosis not present

## 2015-10-22 DIAGNOSIS — Z6841 Body Mass Index (BMI) 40.0 and over, adult: Secondary | ICD-10-CM | POA: Insufficient documentation

## 2015-10-22 DIAGNOSIS — J398 Other specified diseases of upper respiratory tract: Secondary | ICD-10-CM | POA: Insufficient documentation

## 2015-10-22 DIAGNOSIS — K219 Gastro-esophageal reflux disease without esophagitis: Secondary | ICD-10-CM | POA: Diagnosis not present

## 2015-10-22 DIAGNOSIS — Z7902 Long term (current) use of antithrombotics/antiplatelets: Secondary | ICD-10-CM | POA: Diagnosis not present

## 2015-10-22 DIAGNOSIS — E1122 Type 2 diabetes mellitus with diabetic chronic kidney disease: Secondary | ICD-10-CM | POA: Diagnosis not present

## 2015-10-22 DIAGNOSIS — I2721 Secondary pulmonary arterial hypertension: Secondary | ICD-10-CM

## 2015-10-22 DIAGNOSIS — Z794 Long term (current) use of insulin: Secondary | ICD-10-CM | POA: Diagnosis not present

## 2015-10-22 DIAGNOSIS — N184 Chronic kidney disease, stage 4 (severe): Secondary | ICD-10-CM | POA: Diagnosis not present

## 2015-10-22 DIAGNOSIS — K5790 Diverticulosis of intestine, part unspecified, without perforation or abscess without bleeding: Secondary | ICD-10-CM | POA: Diagnosis not present

## 2015-10-22 DIAGNOSIS — Z8673 Personal history of transient ischemic attack (TIA), and cerebral infarction without residual deficits: Secondary | ICD-10-CM | POA: Insufficient documentation

## 2015-10-22 DIAGNOSIS — E1140 Type 2 diabetes mellitus with diabetic neuropathy, unspecified: Secondary | ICD-10-CM | POA: Diagnosis not present

## 2015-10-22 MED ORDER — CARVEDILOL 6.25 MG PO TABS: 9.3750 mg | ORAL_TABLET | Freq: Two times a day (BID) | ORAL | Status: DC

## 2015-10-22 NOTE — Patient Instructions (Signed)
INCREASE Carvedilol (Coreg) to 9.375 mg (1.5 tabs) twice daily.  Will schedule you for an echocardiogram at [facility]. Address: [address] ([place]), [place], [place] [address]  Phone: [phone]  Follow up 6 months with Dr. Haroldine Laws.  Do the following things EVERYDAY: 1) Weigh yourself in the morning before breakfast. Write it down and keep it in a log. 2) Take your medicines as prescribed 3) Eat low salt foods-Limit salt (sodium) to 2000 mg per day.  4) Stay as active as you can everyday 5) Limit all fluids for the day to less than 2 liters

## 2015-10-22 NOTE — Addendum Note (Signed)
Encounter addended by: Effie Berkshire, RN on: 10/22/2015 12:11 PM<BR>     Documentation filed: Dx Association, Patient Instructions Section, Orders
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
## 2015-11-10 ENCOUNTER — Other Ambulatory Visit: Payer: Self-pay

## 2015-11-10 ENCOUNTER — Ambulatory Visit (HOSPITAL_COMMUNITY): Payer: Medicare Other | Attending: Cardiology

## 2015-11-10 DIAGNOSIS — E119 Type 2 diabetes mellitus without complications: Secondary | ICD-10-CM | POA: Diagnosis not present

## 2015-11-10 DIAGNOSIS — E785 Hyperlipidemia, unspecified: Secondary | ICD-10-CM | POA: Insufficient documentation

## 2015-11-10 DIAGNOSIS — I358 Other nonrheumatic aortic valve disorders: Secondary | ICD-10-CM | POA: Diagnosis not present

## 2015-11-10 DIAGNOSIS — Z6841 Body Mass Index (BMI) 40.0 and over, adult: Secondary | ICD-10-CM | POA: Insufficient documentation

## 2015-11-10 DIAGNOSIS — E669 Obesity, unspecified: Secondary | ICD-10-CM | POA: Diagnosis not present

## 2015-11-10 DIAGNOSIS — I5022 Chronic systolic (congestive) heart failure: Secondary | ICD-10-CM

## 2015-11-10 DIAGNOSIS — I110 Hypertensive heart disease with heart failure: Secondary | ICD-10-CM | POA: Diagnosis not present

## 2015-11-10 DIAGNOSIS — I340 Nonrheumatic mitral (valve) insufficiency: Secondary | ICD-10-CM | POA: Insufficient documentation

## 2015-11-10 DIAGNOSIS — I428 Other cardiomyopathies: Secondary | ICD-10-CM | POA: Insufficient documentation

## 2015-11-10 DIAGNOSIS — I509 Heart failure, unspecified: Secondary | ICD-10-CM | POA: Diagnosis present

## 2015-11-11 DIAGNOSIS — E032 Hypothyroidism due to medicaments and other exogenous substances: Secondary | ICD-10-CM | POA: Diagnosis not present

## 2015-11-11 DIAGNOSIS — E118 Type 2 diabetes mellitus with unspecified complications: Secondary | ICD-10-CM | POA: Diagnosis not present

## 2015-11-11 DIAGNOSIS — R05 Cough: Secondary | ICD-10-CM | POA: Diagnosis not present

## 2015-11-15 ENCOUNTER — Ambulatory Visit (HOSPITAL_COMMUNITY): Payer: Medicare Other

## 2015-11-15 ENCOUNTER — Inpatient Hospital Stay (HOSPITAL_COMMUNITY)
Admission: EM | Admit: 2015-11-15 | Discharge: 2015-11-18 | DRG: 291 | Disposition: A | Discharge status: Home / Self Care | Payer: Medicare Other | Attending: Internal Medicine | Admitting: Internal Medicine

## 2015-11-15 ENCOUNTER — Encounter (HOSPITAL_COMMUNITY): Payer: Self-pay | Admitting: Emergency Medicine

## 2015-11-15 ENCOUNTER — Emergency Department (HOSPITAL_COMMUNITY): Payer: Medicare Other

## 2015-11-15 ENCOUNTER — Encounter (HOSPITAL_COMMUNITY): Payer: Self-pay

## 2015-11-15 ENCOUNTER — Ambulatory Visit (INDEPENDENT_AMBULATORY_CARE_PROVIDER_SITE_OTHER)
Admission: EM | Admit: 2015-11-15 | Discharge: 2015-11-15 | Disposition: A | Discharge status: Nursing Home (Includes ICF) | Payer: Medicare Other | Source: Home / Self Care | Attending: Family Medicine | Admitting: Family Medicine

## 2015-11-15 DIAGNOSIS — G4733 Obstructive sleep apnea (adult) (pediatric): Secondary | ICD-10-CM | POA: Diagnosis not present

## 2015-11-15 DIAGNOSIS — I10 Essential (primary) hypertension: Secondary | ICD-10-CM

## 2015-11-15 DIAGNOSIS — I5032 Chronic diastolic (congestive) heart failure: Secondary | ICD-10-CM

## 2015-11-15 DIAGNOSIS — I5023 Acute on chronic systolic (congestive) heart failure: Secondary | ICD-10-CM | POA: Diagnosis not present

## 2015-11-15 DIAGNOSIS — Z9851 Tubal ligation status: Secondary | ICD-10-CM | POA: Insufficient documentation

## 2015-11-15 DIAGNOSIS — I2510 Atherosclerotic heart disease of native coronary artery without angina pectoris: Secondary | ICD-10-CM

## 2015-11-15 DIAGNOSIS — Z7902 Long term (current) use of antithrombotics/antiplatelets: Secondary | ICD-10-CM

## 2015-11-15 DIAGNOSIS — Y95 Nosocomial condition: Secondary | ICD-10-CM | POA: Diagnosis present

## 2015-11-15 DIAGNOSIS — Z6841 Body Mass Index (BMI) 40.0 and over, adult: Secondary | ICD-10-CM

## 2015-11-15 DIAGNOSIS — N183 Chronic kidney disease, stage 3 unspecified: Secondary | ICD-10-CM | POA: Diagnosis present

## 2015-11-15 DIAGNOSIS — N184 Chronic kidney disease, stage 4 (severe): Secondary | ICD-10-CM | POA: Diagnosis present

## 2015-11-15 DIAGNOSIS — Z79899 Other long term (current) drug therapy: Secondary | ICD-10-CM

## 2015-11-15 DIAGNOSIS — I429 Cardiomyopathy, unspecified: Secondary | ICD-10-CM | POA: Diagnosis present

## 2015-11-15 DIAGNOSIS — E785 Hyperlipidemia, unspecified: Secondary | ICD-10-CM | POA: Diagnosis present

## 2015-11-15 DIAGNOSIS — T17990A Other foreign object in respiratory tract, part unspecified in causing asphyxiation, initial encounter: Secondary | ICD-10-CM | POA: Diagnosis present

## 2015-11-15 DIAGNOSIS — R0602 Shortness of breath: Secondary | ICD-10-CM | POA: Diagnosis not present

## 2015-11-15 DIAGNOSIS — E1122 Type 2 diabetes mellitus with diabetic chronic kidney disease: Secondary | ICD-10-CM | POA: Diagnosis present

## 2015-11-15 DIAGNOSIS — J189 Pneumonia, unspecified organism: Secondary | ICD-10-CM | POA: Diagnosis not present

## 2015-11-15 DIAGNOSIS — Z90710 Acquired absence of both cervix and uterus: Secondary | ICD-10-CM

## 2015-11-15 DIAGNOSIS — I5043 Acute on chronic combined systolic (congestive) and diastolic (congestive) heart failure: Secondary | ICD-10-CM | POA: Diagnosis present

## 2015-11-15 DIAGNOSIS — Z794 Long term (current) use of insulin: Secondary | ICD-10-CM | POA: Insufficient documentation

## 2015-11-15 DIAGNOSIS — E119 Type 2 diabetes mellitus without complications: Secondary | ICD-10-CM

## 2015-11-15 DIAGNOSIS — Z8249 Family history of ischemic heart disease and other diseases of the circulatory system: Secondary | ICD-10-CM

## 2015-11-15 DIAGNOSIS — I2721 Secondary pulmonary arterial hypertension: Secondary | ICD-10-CM | POA: Diagnosis present

## 2015-11-15 DIAGNOSIS — K219 Gastro-esophageal reflux disease without esophagitis: Secondary | ICD-10-CM | POA: Diagnosis present

## 2015-11-15 DIAGNOSIS — J398 Other specified diseases of upper respiratory tract: Secondary | ICD-10-CM | POA: Diagnosis present

## 2015-11-15 DIAGNOSIS — E1151 Type 2 diabetes mellitus with diabetic peripheral angiopathy without gangrene: Secondary | ICD-10-CM | POA: Diagnosis present

## 2015-11-15 DIAGNOSIS — K766 Portal hypertension: Secondary | ICD-10-CM | POA: Diagnosis present

## 2015-11-15 DIAGNOSIS — I130 Hypertensive heart and chronic kidney disease with heart failure and stage 1 through stage 4 chronic kidney disease, or unspecified chronic kidney disease: Principal | ICD-10-CM | POA: Diagnosis present

## 2015-11-15 DIAGNOSIS — Z888 Allergy status to other drugs, medicaments and biological substances status: Secondary | ICD-10-CM | POA: Diagnosis not present

## 2015-11-15 DIAGNOSIS — I252 Old myocardial infarction: Secondary | ICD-10-CM | POA: Diagnosis not present

## 2015-11-15 DIAGNOSIS — R05 Cough: Secondary | ICD-10-CM

## 2015-11-15 DIAGNOSIS — Z7982 Long term (current) use of aspirin: Secondary | ICD-10-CM

## 2015-11-15 DIAGNOSIS — I272 Other secondary pulmonary hypertension: Secondary | ICD-10-CM | POA: Diagnosis present

## 2015-11-15 DIAGNOSIS — Z9889 Other specified postprocedural states: Secondary | ICD-10-CM

## 2015-11-15 DIAGNOSIS — Z66 Do not resuscitate: Secondary | ICD-10-CM | POA: Diagnosis present

## 2015-11-15 DIAGNOSIS — J810 Acute pulmonary edema: Secondary | ICD-10-CM | POA: Diagnosis not present

## 2015-11-15 DIAGNOSIS — E118 Type 2 diabetes mellitus with unspecified complications: Secondary | ICD-10-CM | POA: Insufficient documentation

## 2015-11-15 DIAGNOSIS — Z8673 Personal history of transient ischemic attack (TIA), and cerebral infarction without residual deficits: Secondary | ICD-10-CM

## 2015-11-15 HISTORY — DX: Chronic combined systolic (congestive) and diastolic (congestive) heart failure: I50.42

## 2015-11-15 HISTORY — DX: Type 2 diabetes mellitus without complications: E11.9

## 2015-11-15 HISTORY — DX: Essential (primary) hypertension: I10

## 2015-11-15 LAB — CBC WITH DIFFERENTIAL/PLATELET
Basophils Absolute: 0 10*3/uL (ref 0.0–0.1)
Basophils Relative: 0 %
EOS ABS: 0.2 10*3/uL (ref 0.0–0.7)
EOS PCT: 3 %
HCT: 35.6 % — ABNORMAL LOW (ref 36.0–46.0)
Hemoglobin: 11.2 g/dL — ABNORMAL LOW (ref 12.0–15.0)
LYMPHS ABS: 2.3 10*3/uL (ref 0.7–4.0)
Lymphocytes Relative: 25 %
MCH: 26.4 pg (ref 26.0–34.0)
MCHC: 31.5 g/dL (ref 30.0–36.0)
MCV: 84 fL (ref 78.0–100.0)
MONOS PCT: 7 %
Monocytes Absolute: 0.6 10*3/uL (ref 0.1–1.0)
Neutro Abs: 5.8 10*3/uL (ref 1.7–7.7)
Neutrophils Relative %: 65 %
PLATELETS: 216 10*3/uL (ref 150–400)
RBC: 4.24 MIL/uL (ref 3.87–5.11)
RDW: 15.6 % — ABNORMAL HIGH (ref 11.5–15.5)
WBC: 8.9 10*3/uL (ref 4.0–10.5)

## 2015-11-15 LAB — CBG MONITORING, ED: GLUCOSE-CAPILLARY: 112 mg/dL — AB (ref 65–99)

## 2015-11-15 LAB — COMPREHENSIVE METABOLIC PANEL
ALK PHOS: 89 U/L (ref 38–126)
ALT: 13 U/L — AB (ref 14–54)
AST: 16 U/L (ref 15–41)
Albumin: 3.6 g/dL (ref 3.5–5.0)
Anion gap: 9 (ref 5–15)
BILIRUBIN TOTAL: 0.5 mg/dL (ref 0.3–1.2)
BUN: 60 mg/dL — ABNORMAL HIGH (ref 6–20)
CALCIUM: 8.1 mg/dL — AB (ref 8.9–10.3)
CO2: 28 mmol/L (ref 22–32)
CREATININE: 2.5 mg/dL — AB (ref 0.44–1.00)
Chloride: 99 mmol/L — ABNORMAL LOW (ref 101–111)
GFR calc non Af Amer: 19 mL/min — ABNORMAL LOW (ref >60)
GFR, EST AFRICAN AMERICAN: 22 mL/min — AB (ref >60)
Glucose, Bld: 107 mg/dL — ABNORMAL HIGH (ref 65–99)
Potassium: 5.1 mmol/L (ref 3.5–5.1)
SODIUM: 136 mmol/L (ref 135–145)
Total Protein: 7.4 g/dL (ref 6.5–8.1)

## 2015-11-15 LAB — TROPONIN I: Troponin I: 0.03 ng/mL (ref <0.03)

## 2015-11-15 LAB — I-STAT CG4 LACTIC ACID, ED
Lactic Acid, Venous: 1.21 mmol/L (ref 0.5–1.9)
Lactic Acid, Venous: 0.99 mmol/L (ref 0.5–1.9)

## 2015-11-15 LAB — BRAIN NATRIURETIC PEPTIDE: B Natriuretic Peptide: 405.5 pg/mL — ABNORMAL HIGH (ref 0.0–100.0)

## 2015-11-15 MED ORDER — SODIUM CHLORIDE 0.9% FLUSH
3.0000 mL | Freq: Two times a day (BID) | INTRAVENOUS | Status: DC
  Administered 2015-11-17 – 2015-11-18 (×3): 3 mL via INTRAVENOUS

## 2015-11-15 MED ORDER — ONDANSETRON HCL 4 MG/2ML IJ SOLN: 4.0000 mg | Freq: Four times a day (QID) | INTRAMUSCULAR | Status: DC | PRN

## 2015-11-15 MED ORDER — FESOTERODINE FUMARATE ER 8 MG PO TB24
8.0000 mg | ORAL_TABLET | Freq: Every day | ORAL | Status: DC
  Administered 2015-11-16 – 2015-11-18 (×3): 8 mg via ORAL
  Filled 2015-11-15 (×3): qty 1

## 2015-11-15 MED ORDER — ASPIRIN EC 81 MG PO TBEC
81.0000 mg | DELAYED_RELEASE_TABLET | Freq: Every day | ORAL | Status: DC
  Administered 2015-11-16 – 2015-11-18 (×3): 81 mg via ORAL
  Filled 2015-11-15 (×3): qty 1

## 2015-11-15 MED ORDER — ONDANSETRON HCL 4 MG PO TABS: 4.0000 mg | ORAL_TABLET | Freq: Four times a day (QID) | ORAL | Status: DC | PRN

## 2015-11-15 MED ORDER — SACUBITRIL-VALSARTAN 97-103 MG PO TABS
1.0000 | ORAL_TABLET | Freq: Two times a day (BID) | ORAL | Status: DC
  Administered 2015-11-16 – 2015-11-18 (×5): 1 via ORAL
  Filled 2015-11-15 (×6): qty 1

## 2015-11-15 MED ORDER — GUAIFENESIN ER 600 MG PO TB12
600.0000 mg | ORAL_TABLET | Freq: Two times a day (BID) | ORAL | Status: DC
  Administered 2015-11-16 – 2015-11-18 (×6): 600 mg via ORAL
  Filled 2015-11-15 (×6): qty 1

## 2015-11-15 MED ORDER — PREGABALIN 25 MG PO CAPS
75.0000 mg | ORAL_CAPSULE | Freq: Two times a day (BID) | ORAL | Status: DC
  Administered 2015-11-16 – 2015-11-18 (×5): 75 mg via ORAL
  Filled 2015-11-15 (×5): qty 3

## 2015-11-15 MED ORDER — ALBUTEROL SULFATE (2.5 MG/3ML) 0.083% IN NEBU
INHALATION_SOLUTION | RESPIRATORY_TRACT | Status: AC
  Filled 2015-11-15: qty 6

## 2015-11-15 MED ORDER — LORATADINE 10 MG PO TABS: 10.0000 mg | ORAL_TABLET | Freq: Every day | ORAL | Status: DC | PRN

## 2015-11-15 MED ORDER — INSULIN DETEMIR 100 UNIT/ML ~~LOC~~ SOLN
60.0000 [IU] | Freq: Two times a day (BID) | SUBCUTANEOUS | Status: DC
  Administered 2015-11-16 – 2015-11-18 (×3): 60 [IU] via SUBCUTANEOUS
  Filled 2015-11-15 (×6): qty 0.6

## 2015-11-15 MED ORDER — HYDROCODONE-ACETAMINOPHEN 7.5-325 MG PO TABS: 1.0000 | ORAL_TABLET | Freq: Four times a day (QID) | ORAL | Status: DC | PRN

## 2015-11-15 MED ORDER — FEBUXOSTAT 40 MG PO TABS
40.0000 mg | ORAL_TABLET | Freq: Every day | ORAL | Status: DC
  Administered 2015-11-16 – 2015-11-18 (×3): 40 mg via ORAL
  Filled 2015-11-15 (×3): qty 1

## 2015-11-15 MED ORDER — IPRATROPIUM BROMIDE 0.02 % IN SOLN
0.5000 mg | Freq: Once | RESPIRATORY_TRACT | Status: AC
  Administered 2015-11-15: 0.5 mg via RESPIRATORY_TRACT

## 2015-11-15 MED ORDER — FUROSEMIDE 10 MG/ML IJ SOLN
40.0000 mg | Freq: Two times a day (BID) | INTRAMUSCULAR | Status: DC
  Administered 2015-11-16: 40 mg via INTRAVENOUS
  Filled 2015-11-15: qty 4

## 2015-11-15 MED ORDER — ALBUTEROL SULFATE (2.5 MG/3ML) 0.083% IN NEBU
5.0000 mg | INHALATION_SOLUTION | Freq: Once | RESPIRATORY_TRACT | Status: AC
  Administered 2015-11-15: 5 mg via RESPIRATORY_TRACT

## 2015-11-15 MED ORDER — LEVOFLOXACIN 750 MG PO TABS
750.0000 mg | ORAL_TABLET | Freq: Once | ORAL | Status: AC
  Administered 2015-11-15: 750 mg via ORAL
  Filled 2015-11-15: qty 1

## 2015-11-15 MED ORDER — IVABRADINE HCL 7.5 MG PO TABS
7.5000 mg | ORAL_TABLET | Freq: Two times a day (BID) | ORAL | Status: DC
  Administered 2015-11-16 – 2015-11-18 (×5): 7.5 mg via ORAL
  Filled 2015-11-15 (×6): qty 1

## 2015-11-15 MED ORDER — IPRATROPIUM BROMIDE 0.02 % IN SOLN
RESPIRATORY_TRACT | Status: AC
  Filled 2015-11-15: qty 2.5

## 2015-11-15 MED ORDER — SODIUM CHLORIDE 0.9% FLUSH: 3.0000 mL | INTRAVENOUS | Status: DC | PRN

## 2015-11-15 MED ORDER — FAMOTIDINE 20 MG PO TABS
10.0000 mg | ORAL_TABLET | Freq: Every day | ORAL | Status: DC
  Administered 2015-11-16 – 2015-11-18 (×3): 10 mg via ORAL
  Filled 2015-11-15 (×3): qty 1

## 2015-11-15 MED ORDER — ACETAMINOPHEN 325 MG PO TABS: 650.0000 mg | ORAL_TABLET | Freq: Four times a day (QID) | ORAL | Status: DC | PRN

## 2015-11-15 MED ORDER — SODIUM CHLORIDE 0.9% FLUSH
3.0000 mL | Freq: Two times a day (BID) | INTRAVENOUS | Status: DC
  Administered 2015-11-16 – 2015-11-18 (×4): 3 mL via INTRAVENOUS

## 2015-11-15 MED ORDER — CLOPIDOGREL BISULFATE 75 MG PO TABS
75.0000 mg | ORAL_TABLET | Freq: Every day | ORAL | Status: DC
  Administered 2015-11-16 – 2015-11-18 (×3): 75 mg via ORAL
  Filled 2015-11-15 (×3): qty 1

## 2015-11-15 MED ORDER — LEVALBUTEROL HCL 0.63 MG/3ML IN NEBU: 0.6300 mg | INHALATION_SOLUTION | Freq: Four times a day (QID) | RESPIRATORY_TRACT | Status: DC | PRN

## 2015-11-15 MED ORDER — LORATADINE 10 MG PO TABS
10.0000 mg | ORAL_TABLET | Freq: Every day | ORAL | Status: DC
  Administered 2015-11-16 – 2015-11-18 (×3): 10 mg via ORAL
  Filled 2015-11-15 (×3): qty 1

## 2015-11-15 MED ORDER — SODIUM CHLORIDE 0.9 % IV SOLN: 250.0000 mL | INTRAVENOUS | Status: DC | PRN

## 2015-11-15 MED ORDER — SILDENAFIL CITRATE 20 MG PO TABS
20.0000 mg | ORAL_TABLET | Freq: Three times a day (TID) | ORAL | Status: DC
  Administered 2015-11-16 (×2): 20 mg via ORAL
  Filled 2015-11-15 (×3): qty 1

## 2015-11-15 MED ORDER — INSULIN ASPART 100 UNIT/ML ~~LOC~~ SOLN
0.0000 [IU] | SUBCUTANEOUS | Status: DC
  Administered 2015-11-16 (×2): 2 [IU] via SUBCUTANEOUS
  Administered 2015-11-16 (×3): 3 [IU] via SUBCUTANEOUS
  Administered 2015-11-16 – 2015-11-17 (×2): 2 [IU] via SUBCUTANEOUS
  Administered 2015-11-17: 3 [IU] via SUBCUTANEOUS
  Administered 2015-11-17: 1 [IU] via SUBCUTANEOUS
  Administered 2015-11-18: 3 [IU] via SUBCUTANEOUS
  Administered 2015-11-18 (×2): 2 [IU] via SUBCUTANEOUS
  Administered 2015-11-18: 3 [IU] via SUBCUTANEOUS

## 2015-11-15 MED ORDER — CARVEDILOL 6.25 MG PO TABS
9.3750 mg | ORAL_TABLET | Freq: Two times a day (BID) | ORAL | Status: DC
  Administered 2015-11-16 – 2015-11-18 (×5): 9.375 mg via ORAL
  Filled 2015-11-15 (×5): qty 1

## 2015-11-15 MED ORDER — PRAVASTATIN SODIUM 40 MG PO TABS
80.0000 mg | ORAL_TABLET | Freq: Every day | ORAL | Status: DC
  Administered 2015-11-16 – 2015-11-18 (×3): 80 mg via ORAL
  Filled 2015-11-15 (×3): qty 2

## 2015-11-15 MED ORDER — LEVOTHYROXINE SODIUM 25 MCG PO TABS
125.0000 ug | ORAL_TABLET | Freq: Every day | ORAL | Status: DC
  Administered 2015-11-17 – 2015-11-18 (×2): 125 ug via ORAL
  Filled 2015-11-15 (×2): qty 1

## 2015-11-15 MED ORDER — PANTOPRAZOLE SODIUM 40 MG PO TBEC
40.0000 mg | DELAYED_RELEASE_TABLET | Freq: Every day | ORAL | Status: DC
  Administered 2015-11-16 – 2015-11-18 (×3): 40 mg via ORAL
  Filled 2015-11-15 (×3): qty 1

## 2015-11-15 MED ORDER — ACETAMINOPHEN 650 MG RE SUPP: 650.0000 mg | Freq: Four times a day (QID) | RECTAL | Status: DC | PRN
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
## 2015-11-15 NOTE — ED Notes (Signed)
Back from xray
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
## 2015-11-15 NOTE — ED Notes (Signed)
Patient transported to CT 

## 2015-11-15 NOTE — ED Notes (Signed)
PA made aware of Trop level

## 2015-11-15 NOTE — ED Notes (Signed)
Patient transferred to hospital radiology for xray

## 2015-11-15 NOTE — Progress Notes (Signed)
Pharmacy Antibiotic Note  Grace Bradford is a 70 y.o. female admitted on 11/15/2015 with SOB/PNA.  Pharmacy has been consulted for Vancomycin and Zosyn  dosing.  Plan: Vancomycin 2 g IV now, then 1500 mg IV q48h Zosyn 3.375 g IV q8h   Height: 5\' 2"  (157.5 cm) IBW/kg (Calculated) : 50.1  Temp (24hrs), Avg:99 F (37.2 C), Min:98.8 F (37.1 C), Max:99.1 F (37.3 C)   Recent Labs Lab 11/15/15 1625 11/15/15 1734 11/15/15 2026  WBC 8.9  --   --   CREATININE 2.50*  --   --   LATICACIDVEN  --  1.21 0.99    CrCl cannot be calculated (Unknown ideal weight.).    Allergies  Allergen Reactions  . Rocephin [Ceftriaxone Sodium In Dextrose] Itching     Grace Bradford 11/15/2015 11:58 PM

## 2015-11-15 NOTE — ED Notes (Signed)
Patient here from [facility] for ongoing cough and congestion with wheezing.  Family states she is to be admitted for pneumonia. Congested on arrival, NAD. Alert and oriented

## 2015-11-15 NOTE — ED Notes (Addendum)
Cough, congestion, audible wheezing across the room.  Speaking in complete sentences, nad.  Patient saw her provider, given tessalon and antibiotic and performing breathing treatments at home with no relief.  Started treatment tuesday

## 2015-11-16 ENCOUNTER — Encounter (HOSPITAL_COMMUNITY): Payer: Self-pay | Admitting: Cardiology

## 2015-11-16 ENCOUNTER — Other Ambulatory Visit (HOSPITAL_COMMUNITY): Payer: Medicare Other

## 2015-11-16 DIAGNOSIS — E119 Type 2 diabetes mellitus without complications: Secondary | ICD-10-CM

## 2015-11-16 DIAGNOSIS — G4733 Obstructive sleep apnea (adult) (pediatric): Secondary | ICD-10-CM

## 2015-11-16 DIAGNOSIS — I5032 Chronic diastolic (congestive) heart failure: Secondary | ICD-10-CM

## 2015-11-16 DIAGNOSIS — I272 Other secondary pulmonary hypertension: Secondary | ICD-10-CM

## 2015-11-16 DIAGNOSIS — K766 Portal hypertension: Secondary | ICD-10-CM

## 2015-11-16 LAB — COMPREHENSIVE METABOLIC PANEL
ALBUMIN: 3.4 g/dL — AB (ref 3.5–5.0)
ALK PHOS: 92 U/L (ref 38–126)
ALT: 12 U/L — AB (ref 14–54)
AST: 14 U/L — AB (ref 15–41)
Anion gap: 12 (ref 5–15)
BILIRUBIN TOTAL: 0.5 mg/dL (ref 0.3–1.2)
BUN: 67 mg/dL — AB (ref 6–20)
CO2: 26 mmol/L (ref 22–32)
CREATININE: 2.79 mg/dL — AB (ref 0.44–1.00)
Calcium: 8.2 mg/dL — ABNORMAL LOW (ref 8.9–10.3)
Chloride: 99 mmol/L — ABNORMAL LOW (ref 101–111)
GFR calc Af Amer: 19 mL/min — ABNORMAL LOW (ref >60)
GFR, EST NON AFRICAN AMERICAN: 16 mL/min — AB (ref >60)
GLUCOSE: 180 mg/dL — AB (ref 65–99)
POTASSIUM: 4.7 mmol/L (ref 3.5–5.1)
Sodium: 137 mmol/L (ref 135–145)
TOTAL PROTEIN: 7.4 g/dL (ref 6.5–8.1)

## 2015-11-16 LAB — TROPONIN I
TROPONIN I: 0.03 ng/mL — AB (ref <0.03)
TROPONIN I: 0.03 ng/mL — AB (ref <0.03)
Troponin I: 0.03 ng/mL (ref <0.03)

## 2015-11-16 LAB — CBC
HCT: 35 % — ABNORMAL LOW (ref 36.0–46.0)
HEMOGLOBIN: 11.1 g/dL — AB (ref 12.0–15.0)
MCH: 26.2 pg (ref 26.0–34.0)
MCHC: 31.7 g/dL (ref 30.0–36.0)
MCV: 82.7 fL (ref 78.0–100.0)
Platelets: 199 10*3/uL (ref 150–400)
RBC: 4.23 MIL/uL (ref 3.87–5.11)
RDW: 15.3 % (ref 11.5–15.5)
WBC: 8 10*3/uL (ref 4.0–10.5)

## 2015-11-16 LAB — PHOSPHORUS: Phosphorus: 4.2 mg/dL (ref 2.5–4.6)

## 2015-11-16 LAB — GLUCOSE, CAPILLARY
GLUCOSE-CAPILLARY: 172 mg/dL — AB (ref 65–99)
GLUCOSE-CAPILLARY: 175 mg/dL — AB (ref 65–99)
GLUCOSE-CAPILLARY: 182 mg/dL — AB (ref 65–99)
GLUCOSE-CAPILLARY: 209 mg/dL — AB (ref 65–99)
GLUCOSE-CAPILLARY: 219 mg/dL — AB (ref 65–99)
Glucose-Capillary: 66 mg/dL (ref 65–99)

## 2015-11-16 LAB — EXPECTORATED SPUTUM ASSESSMENT W GRAM STAIN, RFLX TO RESP C

## 2015-11-16 LAB — MRSA PCR SCREENING: MRSA BY PCR: NEGATIVE

## 2015-11-16 LAB — EXPECTORATED SPUTUM ASSESSMENT W REFEX TO RESP CULTURE

## 2015-11-16 LAB — STREP PNEUMONIAE URINARY ANTIGEN: Strep Pneumo Urinary Antigen: NEGATIVE

## 2015-11-16 LAB — TSH: TSH: 0.317 u[IU]/mL — AB (ref 0.350–4.500)

## 2015-11-16 LAB — MAGNESIUM: MAGNESIUM: 1.3 mg/dL — AB (ref 1.7–2.4)

## 2015-11-16 MED ORDER — BENZONATATE 100 MG PO CAPS
100.0000 mg | ORAL_CAPSULE | Freq: Three times a day (TID) | ORAL | Status: DC | PRN
  Administered 2015-11-16 – 2015-11-18 (×3): 100 mg via ORAL
  Filled 2015-11-16 (×3): qty 1

## 2015-11-16 MED ORDER — DEXTROSE 50 % IV SOLN
INTRAVENOUS | Status: AC
  Filled 2015-11-16: qty 50

## 2015-11-16 MED ORDER — VANCOMYCIN HCL 10 G IV SOLR
2000.0000 mg | Freq: Once | INTRAVENOUS | Status: AC
  Administered 2015-11-16: 2000 mg via INTRAVENOUS
  Filled 2015-11-16: qty 2000

## 2015-11-16 MED ORDER — VANCOMYCIN HCL 10 G IV SOLR
1500.0000 mg | INTRAVENOUS | Status: DC
  Administered 2015-11-18: 1500 mg via INTRAVENOUS
  Filled 2015-11-16: qty 1500

## 2015-11-16 MED ORDER — PIPERACILLIN-TAZOBACTAM 3.375 G IVPB
3.3750 g | Freq: Three times a day (TID) | INTRAVENOUS | Status: DC
  Administered 2015-11-16 – 2015-11-17 (×5): 3.375 g via INTRAVENOUS
  Filled 2015-11-16 (×7): qty 50

## 2015-11-16 MED ORDER — SILDENAFIL CITRATE 20 MG PO TABS
60.0000 mg | ORAL_TABLET | Freq: Three times a day (TID) | ORAL | Status: DC
  Administered 2015-11-16 – 2015-11-18 (×6): 60 mg via ORAL
  Filled 2015-11-16 (×9): qty 3

## 2015-11-16 MED ORDER — TORSEMIDE 20 MG PO TABS
40.0000 mg | ORAL_TABLET | Freq: Two times a day (BID) | ORAL | Status: DC
  Administered 2015-11-16 – 2015-11-18 (×4): 40 mg via ORAL
  Filled 2015-11-16 (×5): qty 2

## 2015-11-16 NOTE — Progress Notes (Signed)
Utilization review completed.  
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
## 2015-11-16 NOTE — Progress Notes (Signed)
Patient ID: Grace Bradford, female   DOB: Sep 13, 1945, 70 y.o.   MRN: [id number]    PROGRESS NOTE    Grace Bradford  [id number] DOB: 1946-01-12 DOA: 11/15/2015  PCP: Dwan Bolt, MD   Brief Narrative:   70 y.o. female with medical history significant of obesity, CAD,  DM 2, non-ischemic CM, combined CHF nonischemic cardiomyopathy from 2007,   HTN, HLD, CKD, OSA and tracheomalacia, presented with 7 days of worsening cough and shortness of breath associated with some chills, fever.  Assessment & Plan:  History of nonischemic cardiomyopathy followed in the [facility] - Most recent echocardiogram in July actually shows normalization of LVEF in the range of 55-60% with grade 2 diastolic dysfunction and increased LV filling pressures.  - continue Demadex 40 mg BID PO as per cardiology  - monitor daily weights  Mixed pulmonary hypertension - improved on Revatio 60 mg by mouth 3 times a day.  CKD, stage 3-4.  - Baseline creatinine around 2.5 - BMP in AM  HypoMg - supplement and repeat Mg and BMP in AM  OSA, morbid obesity  - uses dental appliance. - Body mass index is 45.2 kg/m.  Pneumonia/right middle lobe - Continue same ABX regimen day #2 - PCCM following     DVT prophylaxis: SCD's Code Status: Full  Family Communication: Patient at bedside  Disposition Plan: Home in 1-2 days   Consultants:  Cardiology  PCCM  Procedures:   None  Antimicrobials:   Vancomycin and Zosyn 7/22 -->   Subjective: Still with cough and exertional dyspnea.   Objective: Vitals:   11/16/15 0000 11/16/15 0030 11/16/15 0514 11/16/15 0759  BP: (!) 171/72 (!) 142/62 (!) 138/54 (!) 152/72  Pulse:   66 69  Resp: 14  16 15   Temp:   98.9 F (37.2 C) 98.5 F (36.9 C)  TempSrc:   Oral Oral  SpO2: 96%  100% 96%  Weight:   112.1 kg (247 lb 2.2 oz)   Height:        Intake/Output Summary (Last 24 hours) at 11/16/15 1223 Last data filed at 11/16/15 1016  Gross per 24 hour  Intake              463 ml  Output             1150 ml  Net             -687 ml   Filed Weights   11/15/15 2355 11/16/15 0514  Weight: 112.1 kg (247 lb 2.2 oz) 112.1 kg (247 lb 2.2 oz)    Examination:  General exam: Appears calm and comfortable  Respiratory system: Scattered rhonchi with crackles in the upper lobes R > L and dullness to percussion.  Cardiovascular system: S1 & S2 heard, RRR. SEM 2/6 Gastrointestinal system: Abdomen is nondistended, soft and nontender. No organomegaly or masses felt. Normal bowel sounds heard. Central nervous system: Alert and oriented. No focal neurological deficits. Extremities: Symmetric 5 x 5 power.   Data Reviewed: I have personally reviewed following labs and imaging studies  CBC:  Recent Labs Lab 11/15/15 1625 11/16/15 0608  WBC 8.9 8.0  NEUTROABS 5.8  --   HGB 11.2* 11.1*  HCT 35.6* 35.0*  MCV 84.0 82.7  PLT 216 [id number]   Basic Metabolic Panel:  Recent Labs Lab 11/15/15 1625 11/16/15 0608  NA 136 137  K 5.1 4.7  CL 99* 99*  CO2 28 26  GLUCOSE 107* 180*  BUN 60*  67*  CREATININE 2.50* 2.79*  CALCIUM 8.1* 8.2*  MG  --  1.3*  PHOS  --  4.2   Liver Function Tests:  Recent Labs Lab 11/15/15 1625 11/16/15 0608  AST 16 14*  ALT 13* 12*  ALKPHOS 89 92  BILITOT 0.5 0.5  PROT 7.4 7.4  ALBUMIN 3.6 3.4*     Recent Labs Lab 11/15/15 1950 11/16/15 0003 11/16/15 0608  TROPONINI 0.03* 0.03* 0.03*   CBG:  Recent Labs Lab 11/15/15 1624 11/16/15 0020 11/16/15 0418 11/16/15 0829  GLUCAP 112* 209* 172* 175*   Thyroid Function Tests:  Recent Labs  11/16/15 0015  TSH 0.317*   Anemia Panel: No results for input(s): VITAMINB12, FOLATE, FERRITIN, TIBC, IRON, RETICCTPCT in the last 72 hours. Urine analysis:    Component Value Date/Time   COLORURINE YELLOW 10/17/2014 2232   APPEARANCEUR CLOUDY (A) 10/17/2014 2232   LABSPEC 1.013 10/17/2014 2232   PHURINE 5.5 10/17/2014 2232   GLUCOSEU  NEGATIVE 10/17/2014 2232   HGBUR NEGATIVE 10/17/2014 2232   BILIRUBINUR NEGATIVE 10/17/2014 2232   KETONESUR NEGATIVE 10/17/2014 2232   PROTEINUR NEGATIVE 10/17/2014 2232   UROBILINOGEN 0.2 10/17/2014 2232   NITRITE NEGATIVE 10/17/2014 2232   LEUKOCYTESUR LARGE (A) 10/17/2014 2232    Recent Results (from the past 240 hour(s))  MRSA PCR Screening     Status: None   Collection Time: 11/15/15 11:52 PM  Result Value Ref Range Status   MRSA by PCR NEGATIVE NEGATIVE Final    Comment:        The GeneXpert MRSA Assay (FDA approved for NASAL specimens only), is one component of a comprehensive MRSA colonization surveillance program. It is not intended to diagnose MRSA infection nor to guide or monitor treatment for MRSA infections.   Culture, expectorated sputum-assessment     Status: None   Collection Time: 11/16/15  4:48 AM  Result Value Ref Range Status   Specimen Description SPUTUM  Final   Special Requests NONE  Final   Sputum evaluation   Final    MICROSCOPIC FINDINGS SUGGEST THAT THIS SPECIMEN IS NOT REPRESENTATIVE OF LOWER RESPIRATORY SECRETIONS. PLEASE RECOLLECT. Gram Stain Report Called to,Read Back By and Verified With: A PETTIFORD,RN AT 0707 11/16/15 BY L BENFIELD    Report Status 11/16/2015 FINAL  Final      Radiology Studies: Dg Chest 2 View  Result Date: 11/15/2015 CLINICAL DATA:  Cough. Congestion and fever. Shortness of breath for 1 week. EXAM: CHEST  2 VIEW COMPARISON:  11/11/2015 FINDINGS: Midline trachea. Mild cardiomegaly. Mediastinal contours otherwise within normal limits. No pleural effusion or pneumothorax. Pulmonary interstitial prominence is moderate, increased. On the lateral view, there is anterior increased density we age may correspond to increased right perihilar density on the frontal radiograph. IMPRESSION: Cardiomegaly with increasing pulmonary interstitial prominence, likely representing mild interstitial edema. Anterior density on the lateral view,  suspicious for either anterior right upper lobe or right middle lobe airspace disease/infection. Electronically Signed   By: Abigail Miyamoto M.D.   On: 11/15/2015 15:18   Ct Chest Wo Contrast  Result Date: 11/15/2015 CLINICAL DATA:  Productive cough 1 week. EXAM: CT CHEST WITHOUT CONTRAST TECHNIQUE: Multidetector CT imaging of the chest was performed following the standard protocol without IV contrast. COMPARISON:  CT 04/08/2014 and chest x-ray today. FINDINGS: Cardiovascular: Mild cardiomegaly. Calcified plaque over the left anterior descending coronary artery. Mild calcified plaque over the thoracic aorta. Mediastinum/Nodes: 1.1 cm subcarinal lymph node and 1 cm right peritracheal lymph node likely reactive.  No definite right hilar mass or adenopathy although difficult to evaluate without intravenous contrast. Remaining mediastinal structures are within normal. Lungs/Pleura: Lungs are adequately inflated with minimal opacification over the lingula. There is right middle lobe collapse. Cut off of 1 of the right middle lobe central bronchi which may be due to mucous plugging. Endobronchial lesions not excluded. No evidence of effusion. Upper Abdomen: Images over the upper abdomen demonstrate no focal abnormality. Musculoskeletal: Degenerative changes of the spine. IMPRESSION: Right middle lobe collapse with obstruction of a central right middle lobe bronchus. This may be due to aspiration, mucous plugging or possible endobronchial lesion. Mild opacification over the lingula which may be due to atelectasis versus infection. No definite mass or adenopathy in the right hilar/ perihilar region, although difficult to evaluate without intravenous contrast. Contrast-enhanced chest CT is recommended for better evaluation of the right hilar region. Cardiomegaly with atherosclerotic coronary artery disease. Minimal aortic atherosclerosis. Electronically Signed   By: Marin Olp M.D.   On: 11/15/2015 21:09       Scheduled Meds: . aspirin EC  81 mg Oral Daily  . carvedilol  9.375 mg Oral BID WC  . clopidogrel  75 mg Oral Daily  . famotidine  10 mg Oral Daily  . febuxostat  40 mg Oral Daily  . fesoterodine  8 mg Oral Daily  . guaiFENesin  600 mg Oral BID  . insulin aspart  0-9 Units Subcutaneous Q4H  . insulin detemir  60 Units Subcutaneous BID  . ivabradine  7.5 mg Oral BID WC  . levothyroxine  125 mcg Oral QAC breakfast  . loratadine  10 mg Oral Daily  . pantoprazole  40 mg Oral Daily  . piperacillin-tazobactam (ZOSYN)  IV  3.375 g Intravenous Q8H  . pravastatin  80 mg Oral Daily  . pregabalin  75 mg Oral BID  . sacubitril-valsartan  1 tablet Oral BID  . sildenafil  60 mg Oral TID  . sodium chloride flush  3 mL Intravenous Q12H  . sodium chloride flush  3 mL Intravenous Q12H  . torsemide  40 mg Oral BID  . [START ON 11/18/2015] vancomycin  1,500 mg Intravenous Q48H   Continuous Infusions:    LOS: 1 day    Time spent: 20 minutes    Faye Ramsay, MD [facility] Pager [phone]  If 7PM-7AM, please contact night-coverage [email or web address] Password [facility] 11/16/2015, 12:23 PM

## 2015-11-17 ENCOUNTER — Inpatient Hospital Stay (HOSPITAL_COMMUNITY): Payer: Medicare Other

## 2015-11-17 DIAGNOSIS — I5032 Chronic diastolic (congestive) heart failure: Secondary | ICD-10-CM

## 2015-11-17 DIAGNOSIS — J189 Pneumonia, unspecified organism: Secondary | ICD-10-CM

## 2015-11-17 DIAGNOSIS — N183 Chronic kidney disease, stage 3 (moderate): Secondary | ICD-10-CM

## 2015-11-17 DIAGNOSIS — I5023 Acute on chronic systolic (congestive) heart failure: Secondary | ICD-10-CM

## 2015-11-17 LAB — GLUCOSE, CAPILLARY
GLUCOSE-CAPILLARY: 103 mg/dL — AB (ref 65–99)
GLUCOSE-CAPILLARY: 173 mg/dL — AB (ref 65–99)
GLUCOSE-CAPILLARY: 138 mg/dL — AB (ref 65–99)
GLUCOSE-CAPILLARY: 230 mg/dL — AB (ref 65–99)
Glucose-Capillary: 205 mg/dL — ABNORMAL HIGH (ref 65–99)
Glucose-Capillary: 42 mg/dL — CL (ref 65–99)
Glucose-Capillary: 76 mg/dL (ref 65–99)

## 2015-11-17 LAB — HEMOGLOBIN A1C
HEMOGLOBIN A1C: 8.2 % — AB (ref 4.8–5.6)
Mean Plasma Glucose: 189 mg/dL

## 2015-11-17 LAB — CBC
HEMATOCRIT: 37.5 % (ref 36.0–46.0)
HEMOGLOBIN: 11.6 g/dL — AB (ref 12.0–15.0)
MCH: 25.4 pg — AB (ref 26.0–34.0)
MCHC: 30.9 g/dL (ref 30.0–36.0)
MCV: 82.2 fL (ref 78.0–100.0)
Platelets: 229 10*3/uL (ref 150–400)
RBC: 4.56 MIL/uL (ref 3.87–5.11)
RDW: 15.5 % (ref 11.5–15.5)
WBC: 6.1 10*3/uL (ref 4.0–10.5)

## 2015-11-17 LAB — BASIC METABOLIC PANEL
Anion gap: 13 (ref 5–15)
BUN: 71 mg/dL — AB (ref 6–20)
CHLORIDE: 98 mmol/L — AB (ref 101–111)
CO2: 28 mmol/L (ref 22–32)
CREATININE: 3.08 mg/dL — AB (ref 0.44–1.00)
Calcium: 8.3 mg/dL — ABNORMAL LOW (ref 8.9–10.3)
GFR calc Af Amer: 17 mL/min — ABNORMAL LOW (ref >60)
GFR calc non Af Amer: 15 mL/min — ABNORMAL LOW (ref >60)
GLUCOSE: 106 mg/dL — AB (ref 65–99)
POTASSIUM: 3.8 mmol/L (ref 3.5–5.1)
Sodium: 139 mmol/L (ref 135–145)

## 2015-11-17 LAB — MAGNESIUM: Magnesium: 1.4 mg/dL — ABNORMAL LOW (ref 1.7–2.4)

## 2015-11-17 MED ORDER — MAGNESIUM SULFATE 2 GM/50ML IV SOLN
2.0000 g | Freq: Once | INTRAVENOUS | Status: AC
  Administered 2015-11-17: 2 g via INTRAVENOUS
  Filled 2015-11-17: qty 50

## 2015-11-17 MED ORDER — PIPERACILLIN-TAZOBACTAM IN DEX 2-0.25 GM/50ML IV SOLN
2.2500 g | Freq: Three times a day (TID) | INTRAVENOUS | Status: DC
  Administered 2015-11-17 – 2015-11-18 (×3): 2.25 g via INTRAVENOUS
  Filled 2015-11-17 (×5): qty 50

## 2015-11-17 NOTE — Progress Notes (Signed)
Hypoglycemic Event  CBG: 42  Treatment: snack  Symptoms: hot, flushed  Follow-up CBG: Time: 3:37 AM  CBG Result: 76  Possible Reasons for Event: med regimen?  Comments/MD notified:    Daxx Tiggs, Blondell Reveal

## 2015-11-17 NOTE — Progress Notes (Addendum)
PULMONARY / CRITICAL CARE MEDICINE   Name: Grace Bradford MRN: [id number] DOB: [birth year]    ADMISSION DATE:  11/15/2015 CONSULTATION DATE:  11/16/15   REFERRING MD:  [place]/ [facility]  CHIEF COMPLAINT:  Cough/ wheeze/ sob   HISTORY OF PRESENT ILLNESS:   54 yobm never smoker with biventricular chf/ tracheomalacia / osa  Refused bipap in past and maint on oral appliance with f/u by Dr Halford Chessman last 06/27/15 and able to do [employer] shopping on best days leaning on a cart/ no 02 or resp rx (not even flutter valve)  acutely ill since 11/09/15 with severe cough with dark sputum/ subjective wheeze and sob not responsive to biaxin started 7/18 so came to ER where admitted to [facility] pm 7/22 to SDU where refused bipap and PCCM service asked to see am 7/23  No obvious day to day or daytime variability or assoc   cp or chest tightness,  or overt sinus or hb symptoms. No unusual exp hx or h/o childhood pna/ asthma or knowledge of premature birth.  Had been Sleeping ok s bipap/cpap and just oral appliance x months PTA without nocturnal  or early am exacerbation  of respiratory  c/o's or need for noct saba. Also denies any obvious fluctuation of symptoms with weather or environmental changes or other aggravating or alleviating factors except as outlined above   PFT 09/24/13 >> FEV1 1.29 (73%), FEV1/fvc ratio  83, TLC 5.10 (107%), DLCO 27%, +BD CT chest 09/27/13 >> severe tracheomalacia, mild air trapping b/l PSG 11/25/13 >> AHI 15, SaO2 low 89% Echo 11/22/14 >> mild LVH, EF [id number], grade 2 diastolic dysfx RHC [id number] >> RA 13, RV 73/8/14, PA 68/18/42, PCW 18, CI 3.2, PVR 3.6 WU Echo 11/10/15 > Left ventricle: The cavity size was mildly dilated. There was   moderate concentric hypertrophy. Systolic function was normal.   The estimated ejection fraction was in the range of 55% to 60%.   Wall motion was normal; there were no regional wall motion   abnormalities. Features are consistent with a pseudonormal  left   ventricular filling pattern, with concomitant abnormal relaxation   and increased filling pressure (grade 2 diastolic dysfunction).   Doppler parameters are consistent with high ventricular filling   pressure. - Aortic valve: Trileaflet; mildly thickened, mildly calcified   leaflets. - Mitral valve: There was mild regurgitation. - Pulmonary arteries: PA peak pressure: 49 mm Hg (S).       SUBJECTIVE:   sitting on side bed joking with nursing staff/ nad on RA   VITAL SIGNS: BP (!) 144/62 (BP Location: Left Wrist)   Pulse (!) 59   Temp 97.8 F (36.6 C) (Oral)   Resp (!) 22   Ht 5\' 2"  (1.575 m)   Wt 114.6 kg (252 lb 10.4 oz)   SpO2 97%   BMI 46.21 kg/m    INTAKE / OUTPUT: I/O last 3 completed shifts: In: 30 [P.O.:600; I.V.:3; IV Piggyback:200] Out: 2200 [Urine:2200]  PHYSICAL EXAMINATION: General: pleasant obese female, NAD, sitting on side of bed eating breakfast  Neuro: awake, alert, appropriate, MAE  HEENT:  Mm moist, no JVD  CV:  s1s2 distant  PULM: resps even non labored on RA, diminished bases, few scattered rhonchi R>L  GI: obese, soft, non tender  Extremities:  Warm and dry, 1+ BLE edema     LABS:  BMET  Recent Labs Lab 11/15/15 1625 11/16/15 0608 11/17/15 0349  NA 136 137 139  K 5.1 4.7 3.8  CL 99* 99* 98*  CO2 28 26 28   BUN 60* 67* 71*  CREATININE 2.50* 2.79* 3.08*  GLUCOSE 107* 180* 106*    Electrolytes  Recent Labs Lab 11/15/15 1625 11/16/15 0608 11/17/15 0349  CALCIUM 8.1* 8.2* 8.3*  MG  --  1.3* 1.4*  PHOS  --  4.2  --     CBC  Recent Labs Lab 11/15/15 1625 11/16/15 0608 11/17/15 0349  WBC 8.9 8.0 6.1  HGB 11.2* 11.1* 11.6*  HCT 35.6* 35.0* 37.5  PLT 216 199 229    Coag's No results for input(s): APTT, INR in the last 168 hours.  Sepsis Markers  Recent Labs Lab 11/15/15 1734 11/15/15 2026  LATICACIDVEN 1.21 0.99    ABG No results for input(s): PHART, PCO2ART, PO2ART in the last 168 hours.  Liver  Enzymes  Recent Labs Lab 11/15/15 1625 11/16/15 0608  AST 16 14*  ALT 13* 12*  ALKPHOS 89 92  BILITOT 0.5 0.5  ALBUMIN 3.6 3.4*    Cardiac Enzymes  Recent Labs Lab 11/16/15 0003 11/16/15 0608 11/16/15 1141  TROPONINI 0.03* 0.03* 0.03*    Glucose  Recent Labs Lab 11/16/15 1209 11/16/15 1716 11/16/15 2002 11/16/15 2334 11/17/15 0307 11/17/15 0851  GLUCAP 182* 66 219* 205* 42* 103*    Imaging No results found.   Studies: CT chest 7/22>>>Right middle lobe collapse with obstruction of a central right middle lobe bronchus. This may be due to aspiration, mucous plugging or possible endobronchial lesion. Mild opacification over the lingula which may be due to atelectasis versus infection. No definite mass or adenopathy in the right hilar/ perihilar region, although difficult to evaluate without intravenous contrast. Contrast-enhanced chest CT is recommended for better evaluation of the right hilar region.  MICRO:  Urine Strep Pneu 7/23 >  Neg MRSA screen 7/23 > neg Sputum 7/23 > rejected as not from lower resp tract BC x 2 7/23 >>>  ANTIBIOTICS: Biaxin 7/18 - 7/22 Zosyn 7/23 >>> Vanc 7/23 >>>    ASSESSMENT / PLAN:  RML consolidation/collapse -- aspiration v HCAP with mucous plugging v less likely malignancy.   Hx tracheomalacia  Pulmonary HTN  OSA/OHS - uses dental appliance   PLAN -  Continue abx --can likely narrow to levaquin and complete total 10 days abx  outpt pulmonary f/u with CXR  Continue revatio Aggressive pulmonary hygiene  F/u CXR today  Continue gentle diuresis per cards  Nickolas Madrid, NP 11/17/2015  9:46 AM Pager: [phone] or [phone]  STAFF NOTE: I, Merrie Roof, MD FACP have personally reviewed patient's available data, including medical history, events of note, physical examination and test results as part of my evaluation. I have discussed with resident/NP and other care providers such as pharmacist, RN and  RRT. In addition, I personally evaluated patient and elicited key findings of:  Awake in chair, no distress, no egophany, no wheezing, mild coarse that clears with cough, no fevers, no wt loss, no hemoptysis, this appears as ATX on CT which is not seen well on pcxr even on 22nd same day as CT, therefore her clinical symptoms are important which she reports are better and she has expectorated well, she needs 7 days abx, consider narrow to one agent today, she needs ambulation and check pulse ox, she needs to follow up with Dr [place] Bells or Halford Chessman and assess need for any follow  Up CT chest imaging pending her symptoms and examination then, consider flutter valve important Will sign off, call if needed  Lavon Paganini. Titus Mould, MD, [place] Pgr: [facility] 11/17/2015 3:22 PM

## 2015-11-17 NOTE — Progress Notes (Signed)
Pharmacy Antibiotic Note  Grace Bradford is a 70 y.o. female admitted on 11/15/2015 with HCAP.  Pharmacy has been consulted for vanc/zosyn dosing.  Afebrile, WBC WNL, creat up to 3.08 and creat cl < 20 ml/min.  Vanc 2 gm loading dose given 7/23 at [place].  CCM may narrow abx to levaquin to complete 10 day course of abx.   Plan: Decrease zosyn to 2.25 q8h Continue vanc 1500 mg IV q48 F/u change to lvq  Height: 5\' 2"  (157.5 cm) Weight: 252 lb 10.4 oz (114.6 kg) IBW/kg (Calculated) : 50.1  Temp (24hrs), Avg:98 F (36.7 C), Min:97.5 F (36.4 C), Max:98.6 F (37 C)   Recent Labs Lab 11/15/15 1625 11/15/15 1734 11/15/15 2026 11/16/15 0608 11/17/15 0349  WBC 8.9  --   --  8.0 6.1  CREATININE 2.50*  --   --  2.79* 3.08*  LATICACIDVEN  --  1.21 0.99  --   --     Estimated Creatinine Clearance: 20.9 mL/min (by C-G formula based on SCr of 3.08 mg/dL).    Allergies  Allergen Reactions  . Rocephin [Ceftriaxone Sodium In Dextrose] Itching    Antimicrobials this admission:  Biaxin 7/18>7/22 Levaquin x1 7/22 Vanc 7/23>> Zosyn 7/23>>  Dose adjustments this admission: Zosyn adjusted 7/24  Microbiology results: 7/22 MRSA PCR neg 7/23 [id number] 7/23 urine strep pneumo neg  Thank you for allowing pharmacy to be a part of this patient's care.  Eudelia Bunch, Pharm.D. [id number] 11/17/2015 2:00 PM

## 2015-11-17 NOTE — Progress Notes (Signed)
Patient ID: Grace Bradford, female   DOB: [birth year], 70 y.o.   MRN: [id number]    PROGRESS NOTE    Grace Bradford  [id number] DOB: [birth year] DOA: 11/15/2015  PCP: Dwan Bolt, MD   Brief Narrative:   70 y.o. female with medical history significant of obesity, CAD,  DM 2, non-ischemic CM, combined CHF nonischemic cardiomyopathy from 2007,   HTN, HLD, CKD, OSA and tracheomalacia, presented with 7 days of worsening cough and shortness of breath associated with some chills, fever.  Assessment & Plan:  History of nonischemic cardiomyopathy followed in the [facility] - Most recent echocardiogram in July actually shows normalization of LVEF in the range of 55-60% with grade 2 diastolic dysfunction and increased LV filling pressures.  - continue Demadex 40 mg BID PO as per cardiology  - weight 114 lbs this AM   Mixed pulmonary hypertension - improved on Revatio 60 mg by mouth 3 times a day.  CKD, stage 3-4.  - Baseline creatinine around 2.5 - now up from [employer] - continue same regimen per cardiology - BMP in AM  HypoMg - continue to supplement and repeat Mg and BMP in AM  OSA, morbid obesity  - uses dental appliance. - Body mass index is 45.2 kg/m.  Pneumonia/right middle lobe - Continue same ABX regimen day #, change to PO upon discharge  - PCCM following    DVT prophylaxis: SCD's Code Status: Full  Family Communication: Patient at bedside  Disposition Plan: Home in AM  Consultants:  Cardiology  PCCM  Procedures:   None  Antimicrobials:   Vancomycin and Zosyn 7/22 -->   Subjective: Still with cough and exertional dyspnea.   Objective: Vitals:   11/17/15 0338 11/17/15 0536 11/17/15 0853 11/17/15 1221  BP: 112/77  (!) 144/62 125/64  Pulse: (!) 55  (!) 59 (!) 53  Resp: (!) 21  (!) 22 18  Temp: 97.5 F (36.4 C)  97.8 F (36.6 C) 98.1 F (36.7 C)  TempSrc: Oral  Oral Oral  SpO2: 97%  97% 98%  Weight:  114.6 kg (252 lb  10.4 oz)    Height:        Intake/Output Summary (Last 24 hours) at 11/17/15 1832 Last data filed at 11/17/15 1600  Gross per 24 hour  Intake              390 ml  Output              550 ml  Net             -160 ml   Filed Weights   11/15/15 2355 11/16/15 0514 11/17/15 0536  Weight: 112.1 kg (247 lb 2.2 oz) 112.1 kg (247 lb 2.2 oz) 114.6 kg (252 lb 10.4 oz)    Examination:  General exam: Appears calm and comfortable  Respiratory system: Scattered rhonchi with crackles in the upper lobes R > L and dullness to percussion.  Cardiovascular system: S1 & S2 heard, RRR. SEM 2/6 Gastrointestinal system: Abdomen is nondistended, soft and nontender. No organomegaly or masses felt. Normal bowel sounds heard. Central nervous system: Alert and oriented. No focal neurological deficits. Extremities: Symmetric 5 x 5 power.   Data Reviewed: I have personally reviewed following labs and imaging studies  CBC:  Recent Labs Lab 11/15/15 1625 11/16/15 0608 11/17/15 0349  WBC 8.9 8.0 6.1  NEUTROABS 5.8  --   --   HGB 11.2* 11.1* 11.6*  HCT 35.6* 35.0* 37.5  MCV  84.0 82.7 82.2  PLT 216 199 [id number]   Basic Metabolic Panel:  Recent Labs Lab 11/15/15 1625 11/16/15 0608 11/17/15 0349  NA 136 137 139  K 5.1 4.7 3.8  CL 99* 99* 98*  CO2 28 26 28   GLUCOSE 107* 180* 106*  BUN 60* 67* 71*  CREATININE 2.50* 2.79* 3.08*  CALCIUM 8.1* 8.2* 8.3*  MG  --  1.3* 1.4*  PHOS  --  4.2  --    Liver Function Tests:  Recent Labs Lab 11/15/15 1625 11/16/15 0608  AST 16 14*  ALT 13* 12*  ALKPHOS 89 92  BILITOT 0.5 0.5  PROT 7.4 7.4  ALBUMIN 3.6 3.4*     Recent Labs Lab 11/15/15 1950 11/16/15 0003 11/16/15 0608 11/16/15 1141  TROPONINI 0.03* 0.03* 0.03* 0.03*   CBG:  Recent Labs Lab 11/17/15 0307 11/17/15 0336 11/17/15 0851 11/17/15 1219 11/17/15 1647  GLUCAP 42* 76 103* 173* 138*   Thyroid Function Tests:  Recent Labs  11/16/15 0015  TSH 0.317*   Anemia Panel: No  results for input(s): VITAMINB12, FOLATE, FERRITIN, TIBC, IRON, RETICCTPCT in the last 72 hours. Urine analysis:    Component Value Date/Time   COLORURINE YELLOW 10/17/2014 2232   APPEARANCEUR CLOUDY (A) 10/17/2014 2232   LABSPEC 1.013 10/17/2014 2232   PHURINE 5.5 10/17/2014 2232   GLUCOSEU NEGATIVE 10/17/2014 2232   HGBUR NEGATIVE 10/17/2014 2232   BILIRUBINUR NEGATIVE 10/17/2014 2232   KETONESUR NEGATIVE 10/17/2014 2232   PROTEINUR NEGATIVE 10/17/2014 2232   UROBILINOGEN 0.2 10/17/2014 2232   NITRITE NEGATIVE 10/17/2014 2232   LEUKOCYTESUR LARGE (A) 10/17/2014 2232    Recent Results (from the past 240 hour(s))  MRSA PCR Screening     Status: None   Collection Time: 11/15/15 11:52 PM  Result Value Ref Range Status   MRSA by PCR NEGATIVE NEGATIVE Final    Comment:        The GeneXpert MRSA Assay (FDA approved for NASAL specimens only), is one component of a comprehensive MRSA colonization surveillance program. It is not intended to diagnose MRSA infection nor to guide or monitor treatment for MRSA infections.   Culture, blood (routine x 2) Call MD if unable to obtain prior to antibiotics being given     Status: None (Preliminary result)   Collection Time: 11/16/15 12:03 AM  Result Value Ref Range Status   Specimen Description BLOOD RIGHT HAND  Final   Special Requests IN PEDIATRIC BOTTLE 4CC  Final   Culture NO GROWTH 1 DAY  Final   Report Status PENDING  Incomplete  Culture, blood (routine x 2) Call MD if unable to obtain prior to antibiotics being given     Status: None (Preliminary result)   Collection Time: 11/16/15 12:15 AM  Result Value Ref Range Status   Specimen Description BLOOD LEFT ANTECUBITAL  Final   Special Requests BOTTLES DRAWN AEROBIC ONLY 5CC  Final   Culture NO GROWTH 1 DAY  Final   Report Status PENDING  Incomplete  Culture, expectorated sputum-assessment     Status: None   Collection Time: 11/16/15  4:48 AM  Result Value Ref Range Status    Specimen Description SPUTUM  Final   Special Requests NONE  Final   Sputum evaluation   Final    MICROSCOPIC FINDINGS SUGGEST THAT THIS SPECIMEN IS NOT REPRESENTATIVE OF LOWER RESPIRATORY SECRETIONS. PLEASE RECOLLECT. Gram Stain Report Called to,Read Back By and Verified With: A PETTIFORD,RN AT 0707 11/16/15 BY L BENFIELD    Report  Status 11/16/2015 FINAL  Final      Radiology Studies: Dg Chest 2 View  Result Date: 11/17/2015 CLINICAL DATA:  Shortness of breath, wheezing. EXAM: CHEST  2 VIEW COMPARISON:  Radiograph of November 15, 2015. FINDINGS: Stable cardiomegaly with central pulmonary vascular congestion. No pneumothorax or pleural effusion is noted. Mild perihilar interstitial densities are noted which may represent edema. Bony thorax is unremarkable. IMPRESSION: Stable cardiomegaly with central pulmonary vascular congestion and possible mild perihilar edema. Electronically Signed   By: Marijo Conception, M.D.   On: 11/17/2015 10:14  Ct Chest Wo Contrast  Result Date: 11/15/2015 CLINICAL DATA:  Productive cough 1 week. EXAM: CT CHEST WITHOUT CONTRAST TECHNIQUE: Multidetector CT imaging of the chest was performed following the standard protocol without IV contrast. COMPARISON:  CT 04/08/2014 and chest x-ray today. FINDINGS: Cardiovascular: Mild cardiomegaly. Calcified plaque over the left anterior descending coronary artery. Mild calcified plaque over the thoracic aorta. Mediastinum/Nodes: 1.1 cm subcarinal lymph node and 1 cm right peritracheal lymph node likely reactive. No definite right hilar mass or adenopathy although difficult to evaluate without intravenous contrast. Remaining mediastinal structures are within normal. Lungs/Pleura: Lungs are adequately inflated with minimal opacification over the lingula. There is right middle lobe collapse. Cut off of 1 of the right middle lobe central bronchi which may be due to mucous plugging. Endobronchial lesions not excluded. No evidence of effusion.  Upper Abdomen: Images over the upper abdomen demonstrate no focal abnormality. Musculoskeletal: Degenerative changes of the spine. IMPRESSION: Right middle lobe collapse with obstruction of a central right middle lobe bronchus. This may be due to aspiration, mucous plugging or possible endobronchial lesion. Mild opacification over the lingula which may be due to atelectasis versus infection. No definite mass or adenopathy in the right hilar/ perihilar region, although difficult to evaluate without intravenous contrast. Contrast-enhanced chest CT is recommended for better evaluation of the right hilar region. Cardiomegaly with atherosclerotic coronary artery disease. Minimal aortic atherosclerosis. Electronically Signed   By: Marin Olp M.D.   On: 11/15/2015 21:09      Scheduled Meds: . aspirin EC  81 mg Oral Daily  . carvedilol  9.375 mg Oral BID WC  . clopidogrel  75 mg Oral Daily  . famotidine  10 mg Oral Daily  . febuxostat  40 mg Oral Daily  . fesoterodine  8 mg Oral Daily  . guaiFENesin  600 mg Oral BID  . insulin aspart  0-9 Units Subcutaneous Q4H  . insulin detemir  60 Units Subcutaneous BID  . ivabradine  7.5 mg Oral BID WC  . levothyroxine  125 mcg Oral QAC breakfast  . loratadine  10 mg Oral Daily  . pantoprazole  40 mg Oral Daily  . piperacillin-tazobactam (ZOSYN)  IV  2.25 g Intravenous Q8H  . pravastatin  80 mg Oral Daily  . pregabalin  75 mg Oral BID  . sacubitril-valsartan  1 tablet Oral BID  . sildenafil  60 mg Oral TID  . sodium chloride flush  3 mL Intravenous Q12H  . sodium chloride flush  3 mL Intravenous Q12H  . torsemide  40 mg Oral BID  . [START ON 11/18/2015] vancomycin  1,500 mg Intravenous Q48H   Continuous Infusions:    LOS: 2 days    Time spent: 20 minutes    Faye Ramsay, MD [facility] Pager [phone]  If 7PM-7AM, please contact night-coverage [email or web address] Password [facility] 11/17/2015, 6:32 PM

## 2015-11-17 NOTE — Progress Notes (Addendum)
Inpatient Diabetes Program Recommendations  AACE/ADA: New Consensus Statement on Inpatient Glycemic Control (2015)  Target Ranges:  Prepandial:   less than 140 mg/dL      Peak postprandial:   less than 180 mg/dL (1-2 hours)      Critically ill patients:  140 - 180 mg/dL  Results for GENEIEVE, ESPEJEL (MRN [id number]) as of 11/17/2015 09:28  Ref. Range 11/16/2015 17:16 11/16/2015 20:02 11/16/2015 23:34 11/17/2015 03:07 11/17/2015 08:51  Glucose-Capillary Latest Ref Range: 65 - 99 mg/dL 66 219 (H) 205 (H) 42 (LL) 103 (H)    Review of Glycemic Control  Diabetes history: DM 2 Outpatient Diabetes medications: Levemir 60 units am + 80 units pm  Current orders for Inpatient glycemic control: Levemir 60 units bid + Novolog correction 0-9 units q4hrs  Inpatient Diabetes Program Recommendations:  Noted hypoglycemia. Please change Novolog correction to tid with meals and may consider decrease in Levemir to 50 units bid.  Thank you, Nani Gasser. Stefon Ramthun, RN, MSN, CDE Inpatient Glycemic Control Team Team Pager [phone] (8am-5pm) 11/17/2015 9:34 AM

## 2015-11-17 NOTE — Progress Notes (Signed)
Patient ambulated in hallway (100 ft). O2 on room air 94%. Patient denies any resp distress at this time.

## 2015-11-18 LAB — GLUCOSE, CAPILLARY
GLUCOSE-CAPILLARY: 159 mg/dL — AB (ref 65–99)
GLUCOSE-CAPILLARY: 164 mg/dL — AB (ref 65–99)
GLUCOSE-CAPILLARY: 211 mg/dL — AB (ref 65–99)
Glucose-Capillary: 202 mg/dL — ABNORMAL HIGH (ref 65–99)
Glucose-Capillary: 177 mg/dL — ABNORMAL HIGH (ref 65–99)

## 2015-11-18 LAB — CBC
HEMATOCRIT: 33.6 % — AB (ref 36.0–46.0)
Hemoglobin: 10.5 g/dL — ABNORMAL LOW (ref 12.0–15.0)
MCH: 25.7 pg — ABNORMAL LOW (ref 26.0–34.0)
MCHC: 31.3 g/dL (ref 30.0–36.0)
MCV: 82.4 fL (ref 78.0–100.0)
Platelets: 230 10*3/uL (ref 150–400)
RBC: 4.08 MIL/uL (ref 3.87–5.11)
RDW: 15.4 % (ref 11.5–15.5)
WBC: 8.3 10*3/uL (ref 4.0–10.5)

## 2015-11-18 LAB — BASIC METABOLIC PANEL
ANION GAP: 13 (ref 5–15)
BUN: 81 mg/dL — ABNORMAL HIGH (ref 6–20)
CALCIUM: 7.6 mg/dL — AB (ref 8.9–10.3)
CO2: 26 mmol/L (ref 22–32)
Chloride: 99 mmol/L — ABNORMAL LOW (ref 101–111)
Creatinine, Ser: 3.56 mg/dL — ABNORMAL HIGH (ref 0.44–1.00)
GFR, EST AFRICAN AMERICAN: 14 mL/min — AB (ref >60)
GFR, EST NON AFRICAN AMERICAN: 12 mL/min — AB (ref >60)
GLUCOSE: 176 mg/dL — AB (ref 65–99)
POTASSIUM: 4.3 mmol/L (ref 3.5–5.1)
Sodium: 138 mmol/L (ref 135–145)

## 2015-11-18 LAB — MAGNESIUM: Magnesium: 2 mg/dL (ref 1.7–2.4)

## 2015-11-18 LAB — LEGIONELLA PNEUMOPHILA SEROGP 1 UR AG: L. pneumophila Serogp 1 Ur Ag: NEGATIVE

## 2015-11-18 MED ORDER — LEVOFLOXACIN 750 MG PO TABS: 750.0000 mg | ORAL_TABLET | Freq: Every day | ORAL | 0 refills | Status: DC

## 2015-11-18 MED ORDER — HYDROCOD POLST-CPM POLST ER 10-8 MG/5ML PO SUER: 5.0000 mL | Freq: Two times a day (BID) | ORAL | 0 refills | Status: DC

## 2015-11-18 MED ORDER — LEVOFLOXACIN 500 MG PO TABS: 500.0000 mg | ORAL_TABLET | ORAL | Status: DC

## 2015-11-18 MED ORDER — LEVOFLOXACIN 750 MG PO TABS
750.0000 mg | ORAL_TABLET | Freq: Once | ORAL | Status: AC
  Administered 2015-11-18: 750 mg via ORAL
  Filled 2015-11-18: qty 1

## 2015-11-18 MED ORDER — LEVOFLOXACIN 500 MG PO TABS: 500.0000 mg | ORAL_TABLET | ORAL | 0 refills | Status: DC

## 2015-11-18 MED ORDER — LEVALBUTEROL HCL 0.63 MG/3ML IN NEBU: 0.6300 mg | INHALATION_SOLUTION | Freq: Four times a day (QID) | RESPIRATORY_TRACT | 3 refills | Status: DC | PRN

## 2015-11-18 NOTE — Discharge Summary (Signed)
Physician Discharge Summary  Grace Bradford AVW:[id number] DOB: Apr 01, 1946 DOA: 11/15/2015  PCP: Michiel Sites, MD  Admit date: 11/15/2015 Discharge date: 11/18/2015  Recommendations for Outpatient Follow-up:  1. Pt will need to follow up with PCP in 1 week post discharge 2. Please obtain BMP to evaluate electrolytes and kidney function 3. Please also check CBC to evaluate Hg and Hct levels 4. Please note that pt insisted on going home today and was advised to follow up with PCP and Pulmonologist as her Cr has been trending up. She is on Demadex which was dosed by cardiologist and her spironolactone has been temporarily stopped until renal function stabilizes. Also she needs repeat CT chest in 4 weeks to ensure resolution of pneumonia.   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)   Acute on chronic systolic heart failure (HCC)   CKD (chronic kidney disease) stage 3, GFR 30-59 ml/min   CAP (community acquired pneumonia)   Type 2 diabetes mellitus (HCC)   Chronic diastolic heart failure (HCC)   HCAP (healthcare-associated pneumonia)   Discharge Condition: Stable  Diet recommendation: Heart healthy diet discussed in details   PCP: Michiel Sites, MD                       Brief Narrative:  70 y.o.femalewith medical history significant of obesity, CAD, DM 2, non-ischemic CM, combined CHF nonischemic cardiomyopathy from 2007, HTN, HLD, CKD, OSA and tracheomalacia, presented with 7 days of worsening cough and shortness of breath associated with some chills, fever.  Assessment & Plan:  History of nonischemic cardiomyopathy [facility] - Most recent echocardiogram in July actually shows normalization of LVEF  in the range of 55-60% with grade 2 diastolic dysfunction and increased LV filling pressures.  - continue Demadex 40 mg BID PO as per cardiology  - weight 114 lbs this AM   Mixed pulmonary hypertension - improved on Revatio 60 mg by mouth 3 times a day.  CKD, stage 3-4.  - Baseline creatinine around 2.5 - now up from [facility] - continue same regimen per cardiology - close monitoring of renal function upon discharge   HypoMg - supplemented   OSA, morbid obesity  - uses dental appliance. - Body mass index is 45.2 kg/m.  Pneumonia/right middle lobe - Continue same ABX regimen day #, change to PO upon discharge  - PCCM cleared for discharge with Levaquin    DVT prophylaxis: SCD's Code Status: Full  Family Communication: Patient and husband at bedside  Disposition Plan: Home   Consultants:  Cardiology  PCCM  Procedures:   None  Antimicrobials:   Vancomycin and Zosyn 7/22 --> transitioned to Levaquin as recommended by PCCM   Procedures/Studies: Dg Chest 2 View  Result Date: 11/17/2015 CLINICAL DATA:  Shortness of breath, wheezing. EXAM: CHEST  2 VIEW COMPARISON:  Radiograph of November 15, 2015. FINDINGS: Stable cardiomegaly with central pulmonary vascular congestion. No pneumothorax or pleural effusion is noted. Mild perihilar interstitial densities are noted which may represent edema. Bony thorax is unremarkable. IMPRESSION: Stable cardiomegaly with central pulmonary vascular congestion and possible mild perihilar edema. Electronically Signed   By: Lupita Raider, M.D.   On: 11/17/2015 10:14  Dg Chest 2 View  Result Date:  11/15/2015 CLINICAL DATA:  Cough. Congestion and fever. Shortness of breath for 1 week. EXAM: CHEST  2 VIEW COMPARISON:  11/11/2015 FINDINGS: Midline trachea. Mild cardiomegaly. Mediastinal contours otherwise within normal limits. No pleural effusion or pneumothorax. Pulmonary interstitial prominence is moderate, increased. On the lateral view,  there is anterior increased density we age may correspond to increased right perihilar density on the frontal radiograph. IMPRESSION: Cardiomegaly with increasing pulmonary interstitial prominence, likely representing mild interstitial edema. Anterior density on the lateral view, suspicious for either anterior right upper lobe or right middle lobe airspace disease/infection. Electronically Signed   By: Jeronimo Greaves M.D.   On: 11/15/2015 15:18   Ct Chest Wo Contrast  Result Date: 11/15/2015 CLINICAL DATA:  Productive cough 1 week. EXAM: CT CHEST WITHOUT CONTRAST TECHNIQUE: Multidetector CT imaging of the chest was performed following the standard protocol without IV contrast. COMPARISON:  CT 04/08/2014 and chest x-ray today. FINDINGS: Cardiovascular: Mild cardiomegaly. Calcified plaque over the left anterior descending coronary artery. Mild calcified plaque over the thoracic aorta. Mediastinum/Nodes: 1.1 cm subcarinal lymph node and 1 cm right peritracheal lymph node likely reactive. No definite right hilar mass or adenopathy although difficult to evaluate without intravenous contrast. Remaining mediastinal structures are within normal. Lungs/Pleura: Lungs are adequately inflated with minimal opacification over the lingula. There is right middle lobe collapse. Cut off of 1 of the right middle lobe central bronchi which may be due to mucous plugging. Endobronchial lesions not excluded. No evidence of effusion. Upper Abdomen: Images over the upper abdomen demonstrate no focal abnormality. Musculoskeletal: Degenerative changes of the spine. IMPRESSION: Right middle lobe collapse with obstruction of a central right middle lobe bronchus. This may be due to aspiration, mucous plugging or possible endobronchial lesion. Mild opacification over the lingula which may be due to atelectasis versus infection. No definite mass or adenopathy in the right hilar/ perihilar region, although difficult to evaluate without  intravenous contrast. Contrast-enhanced chest CT is recommended for better evaluation of the right hilar region. Cardiomegaly with atherosclerotic coronary artery disease. Minimal aortic atherosclerosis. Electronically Signed   By: Elberta Fortis M.D.   On: 11/15/2015 21:09     Discharge Exam: Vitals:   11/18/15 1244 11/18/15 1544  BP: (!) 143/55 135/89  Pulse: 60 (!) 58  Resp: 20 16  Temp: 97.8 F (36.6 C) 97.8 F (36.6 C)   Vitals:   11/18/15 0724 11/18/15 0800 11/18/15 1244 11/18/15 1544  BP: 140/72 138/69 (!) 143/55 135/89  Pulse: 64 (!) 58 60 (!) 58  Resp: 18 17 20 16   Temp: 98 F (36.7 C)  97.8 F (36.6 C) 97.8 F (36.6 C)  TempSrc: Oral  Oral Oral  SpO2: 97% 98% 96% 97%  Weight:      Height:        General: Pt is alert, follows commands appropriately, not in acute distress Cardiovascular: Regular rate and rhythm, S1/S2 +, no rubs, no gallops Respiratory: rhonchi on the right side with mild exp wheezing  Abdominal: Soft, non tender, non distended, bowel sounds +, no guarding Extremities: no cyanosis, pulses palpable bilaterally DP and PT Neuro: Grossly nonfocal  Discharge Instructions  Discharge Instructions    Diet - low sodium heart healthy    Complete by:  As directed   Increase activity slowly    Complete by:  As directed       Medication List    STOP taking these medications   azithromycin 250 MG tablet Commonly known as:  [id number]  clarithromycin 500 MG tablet Commonly known as:  BIAXIN   metaxalone 800 MG tablet Commonly known as:  SKELAXIN   spironolactone 25 MG tablet Commonly known as:  ALDACTONE     TAKE these medications   acetaminophen 500 MG tablet Commonly known as:  TYLENOL Take 1,000 mg by mouth every 6 (six) hours as needed for mild pain or moderate pain.   aspirin EC 81 MG tablet Take 81 mg by mouth daily.   BD PEN NEEDLE NANO U/F 32G X 4 MM Misc Generic drug:  Insulin Pen Needle   B-D ULTRAFINE III SHORT PEN 31G X 8 MM  Misc Generic drug:  Insulin Pen Needle   benzonatate 200 MG capsule Commonly known as:  TESSALON Take 200 mg by mouth 3 (three) times daily as needed for cough.   CALCIUM 600 + D PO Take 1 tablet by mouth daily.   carvedilol 6.25 MG tablet Commonly known as:  COREG Take 1.5 tablets (9.375 mg total) by mouth 2 (two) times daily with a meal.   cetirizine 10 MG tablet Commonly known as:  ZYRTEC   chlorpheniramine-HYDROcodone 10-8 MG/5ML Suer Commonly known as:  TUSSIONEX Take 5 mLs by mouth every 12 (twelve) hours.   clopidogrel 75 MG tablet Commonly known as:  PLAVIX Take 75 mg by mouth daily.   diazepam 5 MG tablet Commonly known as:  VALIUM Take 5 mg by mouth 2 (two) times daily as needed for muscle spasms.   diclofenac sodium 1 % Gel Commonly known as:  VOLTAREN Apply 2 g topically 4 (four) times daily as needed (pain).   febuxostat 40 MG tablet Commonly known as:  ULORIC Take 40 mg by mouth daily.   fluticasone 50 MCG/ACT nasal spray Commonly known as:  FLONASE   freestyle lancets   FREESTYLE LITE test strip Generic drug:  glucose blood   guaiFENesin 600 MG 12 hr tablet Commonly known as:  MUCINEX Take 1,200 mg by mouth 2 (two) times daily.   HYDROcodone-acetaminophen 7.5-325 MG tablet Commonly known as:  NORCO Take 1 tablet by mouth every 6 (six) hours as needed for moderate pain.   hydrOXYzine 25 MG tablet Commonly known as:  ATARAX/VISTARIL Take 25 mg by mouth every 6 (six) hours as needed for itching.   ibuprofen 600 MG tablet Commonly known as:  ADVIL,MOTRIN Take 600 mg by mouth every 6 (six) hours as needed for moderate pain.   ivabradine 5 MG Tabs tablet Commonly known as:  CORLANOR Take 1.5 tablets (7.5 mg total) by mouth 2 (two) times daily with a meal.   levalbuterol 0.63 MG/3ML nebulizer solution Commonly known as:  XOPENEX Take 3 mLs (0.63 mg total) by nebulization every 6 (six) hours as needed for wheezing or shortness of breath.    LEVEMIR FLEXPEN 100 UNIT/ML Pen Generic drug:  Insulin Detemir Take 60 units in the am and 80 units in the pm   levofloxacin 500 MG tablet Commonly known as:  LEVAQUIN Take 1 tablet (500 mg total) by mouth every other day. Start taking on:  11/20/2015   levothyroxine 125 MCG tablet Commonly known as:  SYNTHROID, LEVOTHROID Take 125 mcg by mouth daily.   lidocaine 5 % ointment Commonly known as:  XYLOCAINE Apply 1 application topically 3 (three) times daily as needed (itching).   loratadine 10 MG tablet Commonly known as:  CLARITIN Take 10 mg by mouth daily as needed (seasonal allergies).   metolazone 2.5 MG tablet Commonly known as:  ZAROXOLYN Take 1  tablet (2.5 mg total) by mouth daily as needed (for weight gain of 3-5 lbs).   multivitamin with minerals Tabs tablet Take 1 tablet by mouth daily.   NITRO-BID 2 % ointment Generic drug:  nitroGLYCERIN Apply 0.5 inches topically 2 (two) times daily as needed (wound healing).   omeprazole 20 MG capsule Commonly known as:  PRILOSEC Take 1 capsule (20 mg total) by mouth 2 (two) times daily.   Omeprazole-Sodium Bicarbonate 20-1100 MG Caps capsule Commonly known as:  ZEGERID Take 1 capsule by mouth daily.   ondansetron 4 MG disintegrating tablet Commonly known as:  ZOFRAN ODT Take 1 tablet (4 mg total) by mouth every 8 (eight) hours as needed for nausea or vomiting.   potassium chloride SA 20 MEQ tablet Commonly known as:  K-DUR,KLOR-CON Take 1 tablet (20 mEq total) by mouth daily.   pravastatin 80 MG tablet Commonly known as:  PRAVACHOL Take 80 mg by mouth Daily.   pregabalin 75 MG capsule Commonly known as:  LYRICA Take 75 mg by mouth 2 (two) times daily.   PROBIOTIC PO Take 1 capsule by mouth daily.   promethazine 25 MG tablet Commonly known as:  PHENERGAN Take 25 mg by mouth every 6 (six) hours as needed for nausea.   ranitidine 150 MG tablet Commonly known as:  ZANTAC Take 150 mg by mouth daily at 10  pm.   sacubitril-valsartan 97-103 MG Commonly known as:  ENTRESTO Take 1 tablet by mouth 2 (two) times daily.   sildenafil 20 MG tablet Commonly known as:  REVATIO TAKE 3 TABLETS (60MG ) BY MOUTH THREE TIMES A DAY.   tolterodine 4 MG 24 hr capsule Commonly known as:  DETROL LA Take 8 mg by mouth daily.   torsemide 20 MG tablet Commonly known as:  DEMADEX Take 2 tablets (40 mg total) by mouth 2 (two) times daily.   UNABLE TO FIND Med Name: PENSAID - 2 pumps twice daily as needed for arthritis pain   VICTOZA 18 MG/3ML Sopn Generic drug:  Liraglutide Inject 1.8 mg into the skin at bedtime.      Follow-up Information    Michiel Sites, MD. Call today.   Specialty:  Endocrinology Contact information: [address] [place] [address] [phone]        Danford Bad, NP Follow up on 12/03/2015.   Specialty:  Pulmonary Disease Why:  9:00am  [facility]  Contact information: [address] [place] [address] [phone]        Debbora Presto, MD .   Specialty:  Internal Medicine Why:  [phone] cell call as needed  Contact information: [address] [place] [address] [phone]            The results of significant diagnostics from this hospitalization (including imaging, microbiology, ancillary and laboratory) are listed below for reference.     Microbiology: Recent Results (from the past 240 hour(s))  MRSA PCR Screening     Status: None   Collection Time: 11/15/15 11:52 PM  Result Value Ref Range Status   MRSA by PCR NEGATIVE NEGATIVE Final    Comment:        The GeneXpert MRSA Assay (FDA approved for NASAL specimens only), is one component of a comprehensive MRSA colonization surveillance program. It is not intended to diagnose MRSA infection nor to guide or monitor treatment for MRSA infections.   Culture, blood (routine x 2) Call MD if unable to obtain prior to  antibiotics being given  Status: None (Preliminary result)   Collection Time: 11/16/15 12:03 AM  Result Value Ref Range Status   Specimen Description BLOOD RIGHT HAND  Final   Special Requests IN PEDIATRIC BOTTLE 4CC  Final   Culture NO GROWTH 2 DAYS  Final   Report Status PENDING  Incomplete  Culture, blood (routine x 2) Call MD if unable to obtain prior to antibiotics being given     Status: None (Preliminary result)   Collection Time: 11/16/15 12:15 AM  Result Value Ref Range Status   Specimen Description BLOOD LEFT ANTECUBITAL  Final   Special Requests BOTTLES DRAWN AEROBIC ONLY 5CC  Final   Culture NO GROWTH 2 DAYS  Final   Report Status PENDING  Incomplete  Culture, expectorated sputum-assessment     Status: None   Collection Time: 11/16/15  4:48 AM  Result Value Ref Range Status   Specimen Description SPUTUM  Final   Special Requests NONE  Final   Sputum evaluation   Final    MICROSCOPIC FINDINGS SUGGEST THAT THIS SPECIMEN IS NOT REPRESENTATIVE OF LOWER RESPIRATORY SECRETIONS. PLEASE RECOLLECT. Gram Stain Report Called to,Read Back By and Verified With: A PETTIFORD,RN AT 0707 11/16/15 BY L BENFIELD    Report Status 11/16/2015 FINAL  Final     Labs: Basic Metabolic Panel:  Recent Labs Lab 11/15/15 1625 11/16/15 0608 11/17/15 0349 11/18/15 0242  NA 136 137 139 138  K 5.1 4.7 3.8 4.3  CL 99* 99* 98* 99*  CO2 28 26 28 26   GLUCOSE 107* 180* 106* 176*  BUN 60* 67* 71* 81*  CREATININE 2.50* 2.79* 3.08* 3.56*  CALCIUM 8.1* 8.2* 8.3* 7.6*  MG  --  1.3* 1.4* 2.0  PHOS  --  4.2  --   --    Liver Function Tests:  Recent Labs Lab 11/15/15 1625 11/16/15 0608  AST 16 14*  ALT 13* 12*  ALKPHOS 89 92  BILITOT 0.5 0.5  PROT 7.4 7.4  ALBUMIN 3.6 3.4*   No results for input(s): LIPASE, AMYLASE in the last 168 hours. No results for input(s): AMMONIA in the last 168 hours. CBC:  Recent Labs Lab 11/15/15 1625 11/16/15 0608 11/17/15 0349 11/18/15 0242  WBC 8.9  8.0 6.1 8.3  NEUTROABS 5.8  --   --   --   HGB 11.2* 11.1* 11.6* 10.5*  HCT 35.6* 35.0* 37.5 33.6*  MCV 84.0 82.7 82.2 82.4  PLT 216 199 229 230   Cardiac Enzymes:  Recent Labs Lab 11/15/15 1950 11/16/15 0003 11/16/15 0608 11/16/15 1141  TROPONINI 0.03* 0.03* 0.03* 0.03*   BNP: BNP (last 3 results)  Recent Labs  11/15/15 1950  BNP 405.5*    ProBNP (last 3 results) No results for input(s): PROBNP in the last 8760 hours.  CBG:  Recent Labs Lab 11/17/15 2000 11/18/15 0005 11/18/15 0402 11/18/15 0723 11/18/15 1241  GLUCAP 230* 159* 202* 164* 211*     SIGNED: Time coordinating discharge: 30 minutes  MAGICK-Joletta Manner, MD  [facility] 11/18/2015, 3:46 PM Pager [phone]  If 7PM-7AM, please contact night-coverage [email or web address] Password [email or web address]

## 2015-11-18 NOTE — Progress Notes (Signed)
Patient ambulated in hallway (200 ft). O2 sats on room air stayed 98%.

## 2015-11-18 NOTE — Progress Notes (Signed)
Was notified by [facility] that the patient had an electronic prescription sent in, and a written script for the same antibiotic (levaquin) but for different strengths. I called Dr. Doyle Askew to see which one she wanted the patient to take, and she said the 500 mg every other day. The electronic prescription was sent in error. Unfortunately, Grace Bradford had already picked up the wrong prescription. I left a message with her husband that she needs to return to [facility] to get the right medication and I also left her a voice message on her cell phone.

## 2015-11-18 NOTE — Discharge Instructions (Signed)

## 2015-11-18 NOTE — Care Management Important Message (Signed)
Important Message  Patient Details  Name: Grace Bradford MRN: [id number] Date of Birth: June 06, 1945   Medicare Important Message Given:  Yes    Katherene Dinino Abena 11/18/2015, 10:30 AM

## 2015-11-21 LAB — CULTURE, BLOOD (ROUTINE X 2)
CULTURE: NO GROWTH
CULTURE: NO GROWTH

## 2015-11-28 DIAGNOSIS — E789 Disorder of lipoprotein metabolism, unspecified: Secondary | ICD-10-CM | POA: Diagnosis not present

## 2015-11-28 DIAGNOSIS — E118 Type 2 diabetes mellitus with unspecified complications: Secondary | ICD-10-CM | POA: Diagnosis not present

## 2015-11-28 DIAGNOSIS — E0789 Other specified disorders of thyroid: Secondary | ICD-10-CM | POA: Diagnosis not present

## 2015-12-03 ENCOUNTER — Encounter: Payer: Self-pay | Admitting: Adult Health

## 2015-12-03 ENCOUNTER — Other Ambulatory Visit (INDEPENDENT_AMBULATORY_CARE_PROVIDER_SITE_OTHER): Payer: Medicare Other

## 2015-12-03 ENCOUNTER — Ambulatory Visit (INDEPENDENT_AMBULATORY_CARE_PROVIDER_SITE_OTHER): Payer: Medicare Other | Admitting: Adult Health

## 2015-12-03 ENCOUNTER — Ambulatory Visit (INDEPENDENT_AMBULATORY_CARE_PROVIDER_SITE_OTHER)
Admission: RE | Admit: 2015-12-03 | Discharge: 2015-12-03 | Disposition: A | Discharge status: Home / Self Care | Payer: Medicare Other | Source: Ambulatory Visit | Attending: Adult Health | Admitting: Adult Health

## 2015-12-03 VITALS — BP 138/72 | HR 58 | Temp 99.5°F | Ht 62.5 in | Wt 247.2 lb

## 2015-12-03 DIAGNOSIS — J189 Pneumonia, unspecified organism: Secondary | ICD-10-CM

## 2015-12-03 DIAGNOSIS — J9819 Other pulmonary collapse: Secondary | ICD-10-CM | POA: Diagnosis not present

## 2015-12-03 DIAGNOSIS — I272 Other secondary pulmonary hypertension: Secondary | ICD-10-CM

## 2015-12-03 DIAGNOSIS — I2510 Atherosclerotic heart disease of native coronary artery without angina pectoris: Secondary | ICD-10-CM

## 2015-12-03 LAB — BASIC METABOLIC PANEL
BUN: 44 mg/dL — ABNORMAL HIGH (ref 6–23)
CO2: 33 meq/L — AB (ref 19–32)
Calcium: 8.4 mg/dL (ref 8.4–10.5)
Chloride: 102 mEq/L (ref 96–112)
Creatinine, Ser: 2.21 mg/dL — ABNORMAL HIGH (ref 0.40–1.20)
GFR: 28.32 mL/min — ABNORMAL LOW (ref >60.00)
GLUCOSE: 113 mg/dL — AB (ref 70–99)
Potassium: 4.7 mEq/L (ref 3.5–5.1)
SODIUM: 144 meq/L (ref 135–145)
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
## 2015-12-03 NOTE — Patient Instructions (Signed)
Ok to stop levaquin  Will check your kidney function today Follow up with your primary doctor - scheduled 8/10 with Dr. Wilson Singer Continue revatio  Follow up with Dr. Halford Chessman in 2-3 months

## 2015-12-03 NOTE — Progress Notes (Signed)
Reviewed and agree with assessment/plan.  Chesley Mires, MD [facility] Pulmonary/Critical Care 12/03/2015, 10:02 AM Pager:  [phone]

## 2015-12-04 DIAGNOSIS — J189 Pneumonia, unspecified organism: Secondary | ICD-10-CM | POA: Diagnosis not present

## 2015-12-04 DIAGNOSIS — E118 Type 2 diabetes mellitus with unspecified complications: Secondary | ICD-10-CM | POA: Diagnosis not present

## 2015-12-04 DIAGNOSIS — I10 Essential (primary) hypertension: Secondary | ICD-10-CM | POA: Diagnosis not present

## 2016-01-05 ENCOUNTER — Other Ambulatory Visit (HOSPITAL_COMMUNITY): Payer: Self-pay | Admitting: Internal Medicine

## 2016-01-14 ENCOUNTER — Other Ambulatory Visit (HOSPITAL_COMMUNITY): Payer: Self-pay | Admitting: Internal Medicine

## 2016-02-04 DIAGNOSIS — R0602 Shortness of breath: Secondary | ICD-10-CM | POA: Diagnosis not present

## 2016-02-04 DIAGNOSIS — E118 Type 2 diabetes mellitus with unspecified complications: Secondary | ICD-10-CM | POA: Diagnosis not present

## 2016-02-04 DIAGNOSIS — R609 Edema, unspecified: Secondary | ICD-10-CM | POA: Diagnosis not present

## 2016-02-04 DIAGNOSIS — M542 Cervicalgia: Secondary | ICD-10-CM | POA: Diagnosis not present

## 2016-02-17 ENCOUNTER — Encounter: Payer: Self-pay | Admitting: Pulmonary Disease

## 2016-02-17 ENCOUNTER — Ambulatory Visit (INDEPENDENT_AMBULATORY_CARE_PROVIDER_SITE_OTHER): Payer: Medicare Other | Admitting: Pulmonary Disease

## 2016-02-17 VITALS — BP 122/68 | HR 67 | Ht 62.5 in | Wt 255.0 lb

## 2016-02-17 DIAGNOSIS — G4733 Obstructive sleep apnea (adult) (pediatric): Secondary | ICD-10-CM

## 2016-02-17 DIAGNOSIS — J398 Other specified diseases of upper respiratory tract: Secondary | ICD-10-CM

## 2016-02-17 DIAGNOSIS — I2510 Atherosclerotic heart disease of native coronary artery without angina pectoris: Secondary | ICD-10-CM

## 2016-02-17 NOTE — Patient Instructions (Signed)
Follow up as needed

## 2016-02-17 NOTE — Progress Notes (Signed)
Current Outpatient Prescriptions on File Prior to Visit  Medication Sig  . acetaminophen (TYLENOL) 500 MG tablet Take 1,000 mg by mouth every 6 (six) hours as needed for mild pain or moderate pain.   Marland Kitchen aspirin EC 81 MG tablet Take 81 mg by mouth daily.  . B-D ULTRAFINE III SHORT PEN 31G X 8 MM MISC   . BD PEN NEEDLE NANO U/F 32G X 4 MM MISC   . benzonatate (TESSALON) 200 MG capsule Take 200 mg by mouth 3 (three) times daily as needed for cough.  . Calcium Carb-Cholecalciferol (CALCIUM 600 + D PO) Take 1 tablet by mouth daily.  . carvedilol (COREG) 6.25 MG tablet Take 1.5 tablets (9.375 mg total) by mouth 2 (two) times daily with a meal.  . cetirizine (ZYRTEC) 10 MG tablet   . chlorpheniramine-HYDROcodone (TUSSIONEX) 10-8 MG/5ML SUER Take 5 mLs by mouth every 12 (twelve) hours.  . clopidogrel (PLAVIX) 75 MG tablet Take 75 mg by mouth daily.  . diazepam (VALIUM) 5 MG tablet Take 5 mg by mouth 2 (two) times daily as needed for muscle spasms.   . febuxostat (ULORIC) 40 MG tablet Take 40 mg by mouth daily.  . fluticasone (FLONASE) 50 MCG/ACT nasal spray   . FREESTYLE LITE test strip   . guaiFENesin (MUCINEX) 600 MG 12 hr tablet Take 1,200 mg by mouth 2 (two) times daily.   Marland Kitchen HYDROcodone-acetaminophen (NORCO) 7.5-325 MG per tablet Take 1 tablet by mouth every 6 (six) hours as needed for moderate pain.  . hydrOXYzine (ATARAX/VISTARIL) 25 MG tablet Take 25 mg by mouth every 6 (six) hours as needed for itching.  . Insulin Detemir (LEVEMIR FLEXPEN) 100 UNIT/ML Pen Take 60 units in the am and 80 units in the pm  . ivabradine (CORLANOR) 5 MG TABS tablet Take 1.5 tablets (7.5 mg total) by mouth 2 (two) times daily with a meal.  . Lancets (FREESTYLE) lancets   . levalbuterol (XOPENEX) 0.63 MG/3ML nebulizer solution Take 3 mLs (0.63 mg total) by nebulization every 6 (six) hours as needed for wheezing or shortness of breath.  . levothyroxine (SYNTHROID, LEVOTHROID) 125 MCG tablet Take 125 mcg by mouth daily.   Marland Kitchen lidocaine (XYLOCAINE) 5 % ointment Apply 1 application topically 3 (three) times daily as needed (itching).   . Liraglutide (VICTOZA) 18 MG/3ML SOPN Inject 1.8 mg into the skin at bedtime.  . metolazone (ZAROXOLYN) 2.5 MG tablet take 1 tablet by mouth once daily if needed  . Multiple Vitamin (MULTIVITAMIN WITH MINERALS) TABS tablet Take 1 tablet by mouth daily.  . nitroGLYCERIN (NITRO-BID) 2 % ointment Apply 0.5 inches topically 2 (two) times daily as needed (wound healing).   Marland Kitchen omeprazole (PRILOSEC) 20 MG capsule Take 1 capsule (20 mg total) by mouth 2 (two) times daily.  Earney Navy Bicarbonate (ZEGERID) 20-1100 MG CAPS capsule Take 1 capsule by mouth daily.  . ondansetron (ZOFRAN ODT) 4 MG disintegrating tablet Take 1 tablet (4 mg total) by mouth every 8 (eight) hours as needed for nausea or vomiting.  . potassium chloride SA (K-DUR,KLOR-CON) 20 MEQ tablet Take 1 tablet (20 mEq total) by mouth daily.  . pravastatin (PRAVACHOL) 80 MG tablet Take 80 mg by mouth Daily.   . pregabalin (LYRICA) 75 MG capsule Take 75 mg by mouth 2 (two) times daily.  . Probiotic Product (PROBIOTIC PO) Take 1 capsule by mouth daily.  . promethazine (PHENERGAN) 25 MG tablet Take 25 mg by mouth every 6 (six) hours as needed for nausea.   Marland Kitchen  ranitidine (ZANTAC) 150 MG tablet Take 150 mg by mouth daily at 10 pm.   . sacubitril-valsartan (ENTRESTO) 97-103 MG Take 1 tablet by mouth 2 (two) times daily.  . sildenafil (REVATIO) 20 MG tablet TAKE 3 TABLETS (60MG ) BY MOUTH THREE TIMES DAILY. CALL [phone] FOR REFILLS (GENERIC FOR REVATIO)  . tolterodine (DETROL LA) 4 MG 24 hr capsule Take 8 mg by mouth daily.  Marland Kitchen torsemide (DEMADEX) 20 MG tablet take 2 tablets by mouth twice a day  . UNABLE TO FIND Med Name: PENSAID - 2 pumps twice daily as needed for arthritis pain   No current facility-administered medications on file prior to visit.      Chief Complaint  Patient presents with  . Follow-up    Wears  dental appliance. States that her sleep is great and she sleep all night.   Followed by [facility] for CAP - Pt never got results for CXR in 12/03/15     Pulmonary tests PFT 09/24/13 >> FEV1 1.29 (73%), FEV1% 83, TLC 5.10 (107%), DLCO 27%, +BD CT chest 09/27/13 >> severe tracheomalacia, mild air trapping b/l  Cardiac tests RHC 11/26/14 >> RA 13, RV 73/8/14, PA 68/18/42, PCW 18, CI 3.2, PVR 3.6 WU Echo 11/10/15 >> EF 55 to 60%, grade 2 DD, PAS 49 mmHg  Sleep tests PSG 11/25/13 >> AHI 15, SaO2 low 89% ONO with oral appliance 07/07/15 >> test time 9 hrs 16 min.  Baseline SpO2 94%, low SpO2 86%.  Spent 1 min 36 sec with SpO2 < 88%.  Past medical history DM, non ischemic CM, combined CHF, CAD, HTN, HLD, CKD  Past surgical history, Family history, Social history, Allergies reviewed  Vital Signs BP 122/68 (BP Location: Right Wrist, Cuff Size: Normal)   Pulse 67   Ht 5' 2.5" (1.588 m)   Wt 255 lb (115.7 kg)   SpO2 96%   BMI 45.90 kg/m   History of Present Illness Grace Bradford is a 70 y.o. female with obstructive sleep apnea, and tracheomalacia.  CXR results from August 2017 reviewed with her - pneumonia resolved.  She is not having cough, wheeze, sputum, chest pain, or leg swelling.  Keeps up with activity without difficulty.  Uses oral appliance on most nights >> family reports snoring episodes when she doesn't use this.  No issues with sleep or daytime alertness otherwise.  Recent echo showed improved EF.  Physical Exam  General - No distress ENT - No sinus tenderness, no oral exudate, no LAN, MP 4, enlarged tongue Cardiac - s1s2 regular, no murmur Chest - No wheeze/rales/dullness Back - No focal tenderness Abd - Soft, non-tender Ext - No edema Neuro - Normal strength Skin - No rashes Psych - normal mood, and behavior   Assessment/Plan  Obstructive sleep apnea in he setting tracheomalacia. - She declined PAP therapy, and reports symptomatic benefit from oral  appliance.  Pulmonary hypertension. - she is revatio per cardiology   Patient Instructions  Follow up as needed    Chesley Mires, MD [facility]/Critical Care/Sleep Pager:  [phone] 02/17/2016, 10:34 AM

## 2016-02-19 DIAGNOSIS — R609 Edema, unspecified: Secondary | ICD-10-CM | POA: Diagnosis not present

## 2016-02-19 DIAGNOSIS — I509 Heart failure, unspecified: Secondary | ICD-10-CM | POA: Diagnosis not present

## 2016-02-19 DIAGNOSIS — E118 Type 2 diabetes mellitus with unspecified complications: Secondary | ICD-10-CM | POA: Diagnosis not present

## 2016-02-19 DIAGNOSIS — E032 Hypothyroidism due to medicaments and other exogenous substances: Secondary | ICD-10-CM | POA: Diagnosis not present

## 2016-02-27 ENCOUNTER — Other Ambulatory Visit (HOSPITAL_COMMUNITY): Payer: Self-pay | Admitting: Internal Medicine

## 2016-03-01 ENCOUNTER — Telehealth (HOSPITAL_COMMUNITY): Payer: Self-pay | Admitting: Vascular Surgery

## 2016-03-01 NOTE — Telephone Encounter (Signed)
Pt called she needs to be seen asap she states she is carry paround 10 extra pounds ..please advise

## 2016-03-03 NOTE — Telephone Encounter (Signed)
Patient states her weight is still up 10 lbs, would like to be seen in CHF clinic.  Due to be seen in the next month by MD.  No availability to see primary CHF MD Dr. Haroldine Laws this week.  Will add on to NP clinic this week to be seen. Advised if patient becomes SOB, has CP, or presyncope/syncope to report to ED. Patient aware and agreeable to plan as stated above.  Renee Pain, RN

## 2016-03-04 ENCOUNTER — Ambulatory Visit (HOSPITAL_COMMUNITY)
Admission: RE | Admit: 2016-03-04 | Discharge: 2016-03-04 | Disposition: A | Discharge status: Home / Self Care | Payer: Medicare Other | Source: Ambulatory Visit | Attending: Internal Medicine | Admitting: Internal Medicine

## 2016-03-04 VITALS — BP 124/80 | HR 65 | Wt 250.2 lb

## 2016-03-04 DIAGNOSIS — Z8249 Family history of ischemic heart disease and other diseases of the circulatory system: Secondary | ICD-10-CM | POA: Diagnosis not present

## 2016-03-04 DIAGNOSIS — I272 Pulmonary hypertension, unspecified: Secondary | ICD-10-CM | POA: Insufficient documentation

## 2016-03-04 DIAGNOSIS — Z7982 Long term (current) use of aspirin: Secondary | ICD-10-CM | POA: Insufficient documentation

## 2016-03-04 DIAGNOSIS — G4733 Obstructive sleep apnea (adult) (pediatric): Secondary | ICD-10-CM | POA: Diagnosis not present

## 2016-03-04 DIAGNOSIS — N184 Chronic kidney disease, stage 4 (severe): Secondary | ICD-10-CM | POA: Insufficient documentation

## 2016-03-04 DIAGNOSIS — E1122 Type 2 diabetes mellitus with diabetic chronic kidney disease: Secondary | ICD-10-CM | POA: Insufficient documentation

## 2016-03-04 DIAGNOSIS — K219 Gastro-esophageal reflux disease without esophagitis: Secondary | ICD-10-CM | POA: Diagnosis not present

## 2016-03-04 DIAGNOSIS — J398 Other specified diseases of upper respiratory tract: Secondary | ICD-10-CM | POA: Insufficient documentation

## 2016-03-04 DIAGNOSIS — Z6841 Body Mass Index (BMI) 40.0 and over, adult: Secondary | ICD-10-CM | POA: Diagnosis not present

## 2016-03-04 DIAGNOSIS — I429 Cardiomyopathy, unspecified: Secondary | ICD-10-CM | POA: Diagnosis not present

## 2016-03-04 DIAGNOSIS — Z794 Long term (current) use of insulin: Secondary | ICD-10-CM | POA: Diagnosis not present

## 2016-03-04 DIAGNOSIS — I428 Other cardiomyopathies: Secondary | ICD-10-CM | POA: Diagnosis not present

## 2016-03-04 DIAGNOSIS — Z881 Allergy status to other antibiotic agents status: Secondary | ICD-10-CM | POA: Diagnosis not present

## 2016-03-04 DIAGNOSIS — I2510 Atherosclerotic heart disease of native coronary artery without angina pectoris: Secondary | ICD-10-CM | POA: Insufficient documentation

## 2016-03-04 DIAGNOSIS — E669 Obesity, unspecified: Secondary | ICD-10-CM | POA: Diagnosis not present

## 2016-03-04 DIAGNOSIS — Z8673 Personal history of transient ischemic attack (TIA), and cerebral infarction without residual deficits: Secondary | ICD-10-CM | POA: Insufficient documentation

## 2016-03-04 DIAGNOSIS — G629 Polyneuropathy, unspecified: Secondary | ICD-10-CM | POA: Insufficient documentation

## 2016-03-04 DIAGNOSIS — I5042 Chronic combined systolic (congestive) and diastolic (congestive) heart failure: Secondary | ICD-10-CM | POA: Insufficient documentation

## 2016-03-04 DIAGNOSIS — I219 Acute myocardial infarction, unspecified: Secondary | ICD-10-CM | POA: Insufficient documentation

## 2016-03-04 DIAGNOSIS — I130 Hypertensive heart and chronic kidney disease with heart failure and stage 1 through stage 4 chronic kidney disease, or unspecified chronic kidney disease: Secondary | ICD-10-CM | POA: Diagnosis not present

## 2016-03-04 DIAGNOSIS — I2721 Secondary pulmonary arterial hypertension: Secondary | ICD-10-CM

## 2016-03-04 DIAGNOSIS — I5022 Chronic systolic (congestive) heart failure: Secondary | ICD-10-CM | POA: Diagnosis not present

## 2016-03-04 LAB — BASIC METABOLIC PANEL
ANION GAP: 11 (ref 5–15)
BUN: 73 mg/dL — ABNORMAL HIGH (ref 6–20)
CALCIUM: 7.6 mg/dL — AB (ref 8.9–10.3)
CO2: 28 mmol/L (ref 22–32)
CREATININE: 2.91 mg/dL — AB (ref 0.44–1.00)
Chloride: 102 mmol/L (ref 101–111)
GFR, EST AFRICAN AMERICAN: 18 mL/min — AB (ref >60)
GFR, EST NON AFRICAN AMERICAN: 15 mL/min — AB (ref >60)
Glucose, Bld: 132 mg/dL — ABNORMAL HIGH (ref 65–99)
Potassium: 4.6 mmol/L (ref 3.5–5.1)
SODIUM: 141 mmol/L (ref 135–145)
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
## 2016-03-04 NOTE — Patient Instructions (Signed)
Labs today We will only contact you if something comes back abnormal or we need to make some changes. Otherwise no news is good news!   Your physician recommends that you schedule a follow-up appointment in: [place]

## 2016-03-08 ENCOUNTER — Other Ambulatory Visit (HOSPITAL_COMMUNITY): Payer: Self-pay | Admitting: Internal Medicine

## 2016-03-09 ENCOUNTER — Other Ambulatory Visit (HOSPITAL_COMMUNITY): Payer: Self-pay | Admitting: Internal Medicine

## 2016-03-12 ENCOUNTER — Other Ambulatory Visit (HOSPITAL_COMMUNITY): Payer: Self-pay | Admitting: Internal Medicine

## 2016-03-17 DIAGNOSIS — N184 Chronic kidney disease, stage 4 (severe): Secondary | ICD-10-CM | POA: Diagnosis not present

## 2016-03-17 DIAGNOSIS — M170 Bilateral primary osteoarthritis of knee: Secondary | ICD-10-CM | POA: Diagnosis not present

## 2016-03-17 DIAGNOSIS — M1A09X0 Idiopathic chronic gout, multiple sites, without tophus (tophi): Secondary | ICD-10-CM | POA: Diagnosis not present

## 2016-03-17 DIAGNOSIS — M150 Primary generalized (osteo)arthritis: Secondary | ICD-10-CM | POA: Diagnosis not present

## 2016-03-22 ENCOUNTER — Telehealth (HOSPITAL_COMMUNITY): Payer: Self-pay | Admitting: Cardiology

## 2016-03-22 NOTE — Telephone Encounter (Signed)
PATIENT CALLED WITH CONCERNS REGARDING SPIRO PATIENT REPORTS SHE WAS ATTEMPTING TO REFILL SPIRO HOWEVER MED HAD A D/C ORDER, PATIENT WAS UNDER THR IMPRESSION SHE WAS TO CONTINUE THIS MED.  REVIEWED CHART, SPIRO WAS D.C'D AT DISCHARGE ON 11/2015. LEFT MESSAGE FOR PATIENT TO RETURN CALL AND MED LIST CAL BE REVIEWED IN DETAIL

## 2016-04-02 ENCOUNTER — Other Ambulatory Visit (HOSPITAL_COMMUNITY): Payer: Self-pay | Admitting: Internal Medicine

## 2016-04-05 DIAGNOSIS — N2581 Secondary hyperparathyroidism of renal origin: Secondary | ICD-10-CM | POA: Diagnosis not present

## 2016-04-05 DIAGNOSIS — I10 Essential (primary) hypertension: Secondary | ICD-10-CM | POA: Diagnosis not present

## 2016-04-05 DIAGNOSIS — N183 Chronic kidney disease, stage 3 (moderate): Secondary | ICD-10-CM | POA: Diagnosis not present

## 2016-04-05 DIAGNOSIS — I2721 Secondary pulmonary arterial hypertension: Secondary | ICD-10-CM | POA: Diagnosis not present

## 2016-04-05 DIAGNOSIS — G4730 Sleep apnea, unspecified: Secondary | ICD-10-CM | POA: Diagnosis not present

## 2016-04-05 DIAGNOSIS — E8770 Fluid overload, unspecified: Secondary | ICD-10-CM | POA: Diagnosis not present

## 2016-04-05 NOTE — Telephone Encounter (Signed)
Patients husband reports she was able to get medication concern worked out]  Nothing further needed at this time

## 2016-04-29 ENCOUNTER — Other Ambulatory Visit (HOSPITAL_COMMUNITY): Payer: Self-pay | Admitting: Internal Medicine

## 2016-05-28 ENCOUNTER — Other Ambulatory Visit (HOSPITAL_COMMUNITY): Payer: Self-pay | Admitting: Internal Medicine

## 2016-06-08 ENCOUNTER — Telehealth (HOSPITAL_COMMUNITY): Payer: Self-pay | Admitting: *Deleted

## 2016-06-08 DIAGNOSIS — R0602 Shortness of breath: Secondary | ICD-10-CM | POA: Diagnosis not present

## 2016-06-08 DIAGNOSIS — R609 Edema, unspecified: Secondary | ICD-10-CM | POA: Diagnosis not present

## 2016-06-08 DIAGNOSIS — E789 Disorder of lipoprotein metabolism, unspecified: Secondary | ICD-10-CM | POA: Diagnosis not present

## 2016-06-08 DIAGNOSIS — E118 Type 2 diabetes mellitus with unspecified complications: Secondary | ICD-10-CM | POA: Diagnosis not present
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
## 2016-06-11 DIAGNOSIS — E0789 Other specified disorders of thyroid: Secondary | ICD-10-CM | POA: Diagnosis not present

## 2016-06-11 DIAGNOSIS — I10 Essential (primary) hypertension: Secondary | ICD-10-CM | POA: Diagnosis not present

## 2016-06-11 DIAGNOSIS — E118 Type 2 diabetes mellitus with unspecified complications: Secondary | ICD-10-CM | POA: Diagnosis not present

## 2016-06-11 DIAGNOSIS — E789 Disorder of lipoprotein metabolism, unspecified: Secondary | ICD-10-CM | POA: Diagnosis not present

## 2016-06-17 DIAGNOSIS — I10 Essential (primary) hypertension: Secondary | ICD-10-CM | POA: Diagnosis not present

## 2016-06-17 DIAGNOSIS — E118 Type 2 diabetes mellitus with unspecified complications: Secondary | ICD-10-CM | POA: Diagnosis not present

## 2016-06-17 DIAGNOSIS — E789 Disorder of lipoprotein metabolism, unspecified: Secondary | ICD-10-CM | POA: Diagnosis not present

## 2016-07-05 ENCOUNTER — Ambulatory Visit (HOSPITAL_COMMUNITY)
Admission: RE | Admit: 2016-07-05 | Discharge: 2016-07-05 | Disposition: A | Discharge status: Home / Self Care | Payer: Medicare Other | Source: Ambulatory Visit | Attending: Internal Medicine | Admitting: Internal Medicine

## 2016-07-05 VITALS — BP 140/80 | HR 81 | Wt 250.0 lb

## 2016-07-05 DIAGNOSIS — Z7982 Long term (current) use of aspirin: Secondary | ICD-10-CM | POA: Insufficient documentation

## 2016-07-05 DIAGNOSIS — Z881 Allergy status to other antibiotic agents status: Secondary | ICD-10-CM | POA: Diagnosis not present

## 2016-07-05 DIAGNOSIS — I130 Hypertensive heart and chronic kidney disease with heart failure and stage 1 through stage 4 chronic kidney disease, or unspecified chronic kidney disease: Secondary | ICD-10-CM | POA: Diagnosis not present

## 2016-07-05 DIAGNOSIS — Z7902 Long term (current) use of antithrombotics/antiplatelets: Secondary | ICD-10-CM | POA: Insufficient documentation

## 2016-07-05 DIAGNOSIS — I5042 Chronic combined systolic (congestive) and diastolic (congestive) heart failure: Secondary | ICD-10-CM | POA: Diagnosis not present

## 2016-07-05 DIAGNOSIS — K766 Portal hypertension: Secondary | ICD-10-CM | POA: Diagnosis not present

## 2016-07-05 DIAGNOSIS — N184 Chronic kidney disease, stage 4 (severe): Secondary | ICD-10-CM | POA: Diagnosis not present

## 2016-07-05 DIAGNOSIS — I5032 Chronic diastolic (congestive) heart failure: Secondary | ICD-10-CM | POA: Diagnosis not present

## 2016-07-05 DIAGNOSIS — I10 Essential (primary) hypertension: Secondary | ICD-10-CM | POA: Diagnosis not present

## 2016-07-05 DIAGNOSIS — R0609 Other forms of dyspnea: Secondary | ICD-10-CM | POA: Diagnosis not present

## 2016-07-05 DIAGNOSIS — Z8249 Family history of ischemic heart disease and other diseases of the circulatory system: Secondary | ICD-10-CM | POA: Insufficient documentation

## 2016-07-05 DIAGNOSIS — E1140 Type 2 diabetes mellitus with diabetic neuropathy, unspecified: Secondary | ICD-10-CM | POA: Diagnosis not present

## 2016-07-05 DIAGNOSIS — Z8673 Personal history of transient ischemic attack (TIA), and cerebral infarction without residual deficits: Secondary | ICD-10-CM | POA: Insufficient documentation

## 2016-07-05 DIAGNOSIS — I429 Cardiomyopathy, unspecified: Secondary | ICD-10-CM | POA: Insufficient documentation

## 2016-07-05 DIAGNOSIS — I219 Acute myocardial infarction, unspecified: Secondary | ICD-10-CM | POA: Diagnosis not present

## 2016-07-05 DIAGNOSIS — Z794 Long term (current) use of insulin: Secondary | ICD-10-CM | POA: Insufficient documentation

## 2016-07-05 DIAGNOSIS — E669 Obesity, unspecified: Secondary | ICD-10-CM | POA: Diagnosis not present

## 2016-07-05 DIAGNOSIS — I272 Pulmonary hypertension, unspecified: Secondary | ICD-10-CM | POA: Insufficient documentation

## 2016-07-05 DIAGNOSIS — G4733 Obstructive sleep apnea (adult) (pediatric): Secondary | ICD-10-CM | POA: Insufficient documentation

## 2016-07-05 DIAGNOSIS — R0602 Shortness of breath: Secondary | ICD-10-CM | POA: Diagnosis not present

## 2016-07-05 DIAGNOSIS — I252 Old myocardial infarction: Secondary | ICD-10-CM | POA: Diagnosis not present

## 2016-07-05 DIAGNOSIS — J398 Other specified diseases of upper respiratory tract: Secondary | ICD-10-CM | POA: Insufficient documentation

## 2016-07-05 DIAGNOSIS — E1122 Type 2 diabetes mellitus with diabetic chronic kidney disease: Secondary | ICD-10-CM | POA: Insufficient documentation

## 2016-07-05 DIAGNOSIS — I2510 Atherosclerotic heart disease of native coronary artery without angina pectoris: Secondary | ICD-10-CM | POA: Insufficient documentation

## 2016-07-05 DIAGNOSIS — K219 Gastro-esophageal reflux disease without esophagitis: Secondary | ICD-10-CM | POA: Insufficient documentation

## 2016-07-05 DIAGNOSIS — Z6841 Body Mass Index (BMI) 40.0 and over, adult: Secondary | ICD-10-CM | POA: Diagnosis not present

## 2016-07-05 DIAGNOSIS — I2721 Secondary pulmonary arterial hypertension: Secondary | ICD-10-CM

## 2016-07-05 LAB — BASIC METABOLIC PANEL
Anion gap: 14 (ref 5–15)
BUN: 44 mg/dL — AB (ref 6–20)
CO2: 30 mmol/L (ref 22–32)
CREATININE: 2.31 mg/dL — AB (ref 0.44–1.00)
Calcium: 7.2 mg/dL — ABNORMAL LOW (ref 8.9–10.3)
Chloride: 97 mmol/L — ABNORMAL LOW (ref 101–111)
GFR, EST AFRICAN AMERICAN: 24 mL/min — AB (ref >60)
GFR, EST NON AFRICAN AMERICAN: 20 mL/min — AB (ref >60)
Glucose, Bld: 148 mg/dL — ABNORMAL HIGH (ref 65–99)
POTASSIUM: 3.2 mmol/L — AB (ref 3.5–5.1)
SODIUM: 141 mmol/L (ref 135–145)

## 2016-07-05 NOTE — Patient Instructions (Signed)
Labs today (will call for abnormal results, otherwise no news is good news)  Echo has been ordered for you  You have been referred for Pulmonary Rehab, they will contact you for initial appointment.  Follow up in 3 Months

## 2016-07-05 NOTE — Addendum Note (Signed)
Encounter addended by: Scarlette Calico, RN on: 07/05/2016  2:08 PM<BR>    Actions taken: Order list changed, Diagnosis association updated
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
## 2016-07-07 ENCOUNTER — Telehealth (HOSPITAL_COMMUNITY): Payer: Self-pay | Admitting: Cardiology

## 2016-07-07 DIAGNOSIS — I272 Pulmonary hypertension, unspecified: Secondary | ICD-10-CM

## 2016-07-07 MED ORDER — POTASSIUM CHLORIDE CRYS ER 20 MEQ PO TBCR: 20.0000 meq | EXTENDED_RELEASE_TABLET | Freq: Two times a day (BID) | ORAL | 6 refills | Status: DC

## 2016-07-07 NOTE — Telephone Encounter (Signed)
Patient aware. PT VOICED UNDERSTANDING, REPEAT LABS 3/23

## 2016-07-07 NOTE — Telephone Encounter (Signed)
-----   Message from Arbutus Leas, NP sent at 07/05/2016  3:13 PM EDT ----- Please call Grace Bradford and tell her to take 38mEq of KCl daily, instead of 35mEq daily. Please get BMET next week.

## 2016-07-10 ENCOUNTER — Ambulatory Visit (INDEPENDENT_AMBULATORY_CARE_PROVIDER_SITE_OTHER): Payer: Medicare Other

## 2016-07-10 ENCOUNTER — Ambulatory Visit (HOSPITAL_COMMUNITY)
Admission: EM | Admit: 2016-07-10 | Discharge: 2016-07-10 | Disposition: A | Discharge status: Home / Self Care | Payer: Medicare Other | Attending: Family Medicine | Admitting: Family Medicine

## 2016-07-10 ENCOUNTER — Encounter (HOSPITAL_COMMUNITY): Payer: Self-pay | Admitting: Family Medicine

## 2016-07-10 DIAGNOSIS — I5022 Chronic systolic (congestive) heart failure: Secondary | ICD-10-CM

## 2016-07-10 DIAGNOSIS — R7989 Other specified abnormal findings of blood chemistry: Secondary | ICD-10-CM | POA: Diagnosis not present

## 2016-07-10 DIAGNOSIS — R05 Cough: Secondary | ICD-10-CM | POA: Diagnosis not present

## 2016-07-10 DIAGNOSIS — R0602 Shortness of breath: Secondary | ICD-10-CM | POA: Diagnosis not present

## 2016-07-10 DIAGNOSIS — M791 Myalgia, unspecified site: Secondary | ICD-10-CM

## 2016-07-10 LAB — POCT I-STAT, CHEM 8
BUN: 36 mg/dL — ABNORMAL HIGH (ref 6–20)
CREATININE: 1.9 mg/dL — AB (ref 0.44–1.00)
Calcium, Ion: 0.79 mmol/L — CL (ref 1.15–1.40)
Chloride: 102 mmol/L (ref 101–111)
Glucose, Bld: 188 mg/dL — ABNORMAL HIGH (ref 65–99)
HEMATOCRIT: 36 % (ref 36.0–46.0)
Hemoglobin: 12.2 g/dL (ref 12.0–15.0)
POTASSIUM: 4 mmol/L (ref 3.5–5.1)
SODIUM: 144 mmol/L (ref 135–145)
TCO2: 29 mmol/L (ref 0–100)

## 2016-07-10 MED ORDER — ALBUTEROL SULFATE (2.5 MG/3ML) 0.083% IN NEBU: 2.5000 mg | INHALATION_SOLUTION | Freq: Once | RESPIRATORY_TRACT | Status: DC

## 2016-07-10 MED ORDER — IPRATROPIUM-ALBUTEROL 0.5-2.5 (3) MG/3ML IN SOLN
RESPIRATORY_TRACT | Status: AC
  Filled 2016-07-10: qty 3

## 2016-07-10 MED ORDER — IPRATROPIUM-ALBUTEROL 0.5-2.5 (3) MG/3ML IN SOLN
3.0000 mL | Freq: Once | RESPIRATORY_TRACT | Status: AC
  Administered 2016-07-10: 3 mL via RESPIRATORY_TRACT
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
## 2016-07-10 NOTE — ED Triage Notes (Signed)
Here for SOB onset 3 days associated w/wheezing and dyspnea, prod cough, chills, nasal congestion/grainage  Denies fevers  Hx of CHF  A&O x4... NAD

## 2016-07-10 NOTE — Discharge Instructions (Signed)
You need to continue your nebulizer treatments. We do not see signs of pneumonia at this point. I believe most of her symptoms can be attributed to a viral infection. If you aren't getting worse, please follow-up with your primary care doctor

## 2016-07-16 ENCOUNTER — Ambulatory Visit (HOSPITAL_COMMUNITY)
Admission: RE | Admit: 2016-07-16 | Discharge: 2016-07-16 | Disposition: A | Discharge status: Home / Self Care | Payer: Medicare Other | Source: Ambulatory Visit | Attending: Internal Medicine | Admitting: Internal Medicine

## 2016-07-16 DIAGNOSIS — I272 Pulmonary hypertension, unspecified: Secondary | ICD-10-CM | POA: Diagnosis not present

## 2016-07-16 LAB — BASIC METABOLIC PANEL
Anion gap: 15 (ref 5–15)
BUN: 46 mg/dL — ABNORMAL HIGH (ref 6–20)
CALCIUM: 6.5 mg/dL — AB (ref 8.9–10.3)
CHLORIDE: 99 mmol/L — AB (ref 101–111)
CO2: 29 mmol/L (ref 22–32)
CREATININE: 2.26 mg/dL — AB (ref 0.44–1.00)
GFR calc Af Amer: 24 mL/min — ABNORMAL LOW (ref >60)
GFR calc non Af Amer: 21 mL/min — ABNORMAL LOW (ref >60)
Glucose, Bld: 136 mg/dL — ABNORMAL HIGH (ref 65–99)
Potassium: 3.6 mmol/L (ref 3.5–5.1)
Sodium: 143 mmol/L (ref 135–145)

## 2016-07-20 ENCOUNTER — Ambulatory Visit (HOSPITAL_COMMUNITY)
Admission: RE | Admit: 2016-07-20 | Discharge: 2016-07-20 | Disposition: A | Discharge status: Home / Self Care | Payer: Medicare Other | Source: Ambulatory Visit | Attending: Internal Medicine | Admitting: Internal Medicine

## 2016-07-20 DIAGNOSIS — I5032 Chronic diastolic (congestive) heart failure: Secondary | ICD-10-CM | POA: Diagnosis not present

## 2016-07-20 DIAGNOSIS — I420 Dilated cardiomyopathy: Secondary | ICD-10-CM | POA: Insufficient documentation

## 2016-07-20 DIAGNOSIS — I501 Left ventricular failure: Secondary | ICD-10-CM | POA: Diagnosis not present

## 2016-07-20 NOTE — Progress Notes (Signed)
Echocardiogram 2D Echocardiogram has been performed.  Grace Bradford 07/20/2016, 3:11 PM

## 2016-07-23 ENCOUNTER — Encounter (HOSPITAL_COMMUNITY): Payer: Self-pay | Admitting: Cardiology

## 2016-07-23 ENCOUNTER — Other Ambulatory Visit (HOSPITAL_COMMUNITY): Payer: Self-pay

## 2016-07-23 DIAGNOSIS — R0600 Dyspnea, unspecified: Secondary | ICD-10-CM

## 2016-08-13 ENCOUNTER — Encounter (HOSPITAL_COMMUNITY)
Admission: RE | Disposition: A | Discharge status: Home / Self Care | Payer: Self-pay | Source: Ambulatory Visit | Attending: Internal Medicine

## 2016-08-13 ENCOUNTER — Ambulatory Visit (HOSPITAL_COMMUNITY)
Admission: RE | Admit: 2016-08-13 | Discharge: 2016-08-13 | Disposition: A | Discharge status: Home / Self Care | Payer: Medicare Other | Source: Ambulatory Visit | Attending: Internal Medicine | Admitting: Internal Medicine

## 2016-08-13 ENCOUNTER — Encounter (HOSPITAL_COMMUNITY): Payer: Self-pay | Admitting: Internal Medicine

## 2016-08-13 DIAGNOSIS — I2721 Secondary pulmonary arterial hypertension: Secondary | ICD-10-CM | POA: Insufficient documentation

## 2016-08-13 DIAGNOSIS — Z8673 Personal history of transient ischemic attack (TIA), and cerebral infarction without residual deficits: Secondary | ICD-10-CM | POA: Insufficient documentation

## 2016-08-13 DIAGNOSIS — I252 Old myocardial infarction: Secondary | ICD-10-CM | POA: Insufficient documentation

## 2016-08-13 DIAGNOSIS — I2510 Atherosclerotic heart disease of native coronary artery without angina pectoris: Secondary | ICD-10-CM | POA: Insufficient documentation

## 2016-08-13 DIAGNOSIS — Z8249 Family history of ischemic heart disease and other diseases of the circulatory system: Secondary | ICD-10-CM | POA: Insufficient documentation

## 2016-08-13 DIAGNOSIS — Z7951 Long term (current) use of inhaled steroids: Secondary | ICD-10-CM | POA: Insufficient documentation

## 2016-08-13 DIAGNOSIS — E1140 Type 2 diabetes mellitus with diabetic neuropathy, unspecified: Secondary | ICD-10-CM | POA: Insufficient documentation

## 2016-08-13 DIAGNOSIS — Z6841 Body Mass Index (BMI) 40.0 and over, adult: Secondary | ICD-10-CM | POA: Insufficient documentation

## 2016-08-13 DIAGNOSIS — E039 Hypothyroidism, unspecified: Secondary | ICD-10-CM | POA: Insufficient documentation

## 2016-08-13 DIAGNOSIS — I130 Hypertensive heart and chronic kidney disease with heart failure and stage 1 through stage 4 chronic kidney disease, or unspecified chronic kidney disease: Secondary | ICD-10-CM | POA: Diagnosis not present

## 2016-08-13 DIAGNOSIS — E1122 Type 2 diabetes mellitus with diabetic chronic kidney disease: Secondary | ICD-10-CM | POA: Insufficient documentation

## 2016-08-13 DIAGNOSIS — G4733 Obstructive sleep apnea (adult) (pediatric): Secondary | ICD-10-CM | POA: Diagnosis not present

## 2016-08-13 DIAGNOSIS — I428 Other cardiomyopathies: Secondary | ICD-10-CM | POA: Diagnosis not present

## 2016-08-13 DIAGNOSIS — Z794 Long term (current) use of insulin: Secondary | ICD-10-CM | POA: Diagnosis not present

## 2016-08-13 DIAGNOSIS — Z7902 Long term (current) use of antithrombotics/antiplatelets: Secondary | ICD-10-CM | POA: Diagnosis not present

## 2016-08-13 DIAGNOSIS — N184 Chronic kidney disease, stage 4 (severe): Secondary | ICD-10-CM | POA: Insufficient documentation

## 2016-08-13 DIAGNOSIS — E669 Obesity, unspecified: Secondary | ICD-10-CM | POA: Insufficient documentation

## 2016-08-13 DIAGNOSIS — Z7982 Long term (current) use of aspirin: Secondary | ICD-10-CM | POA: Insufficient documentation

## 2016-08-13 DIAGNOSIS — R0600 Dyspnea, unspecified: Secondary | ICD-10-CM

## 2016-08-13 DIAGNOSIS — K219 Gastro-esophageal reflux disease without esophagitis: Secondary | ICD-10-CM | POA: Diagnosis not present

## 2016-08-13 DIAGNOSIS — K449 Diaphragmatic hernia without obstruction or gangrene: Secondary | ICD-10-CM | POA: Insufficient documentation

## 2016-08-13 DIAGNOSIS — I5042 Chronic combined systolic (congestive) and diastolic (congestive) heart failure: Secondary | ICD-10-CM | POA: Insufficient documentation

## 2016-08-13 HISTORY — PX: RIGHT HEART CATH: CATH118263

## 2016-08-13 LAB — POCT I-STAT 3, VENOUS BLOOD GAS (G3P V)
ACID-BASE EXCESS: 6 mmol/L — AB (ref 0.0–2.0)
ACID-BASE EXCESS: 8 mmol/L — AB (ref 0.0–2.0)
BICARBONATE: 34.1 mmol/L — AB (ref 20.0–28.0)
BICARBONATE: 35.7 mmol/L — AB (ref 20.0–28.0)
O2 SAT: 58 %
O2 SAT: 59 %
PCO2 VEN: 67 mmHg — AB (ref 44.0–60.0)
PH VEN: 7.314 (ref 7.250–7.430)
PH VEN: 7.323 (ref 7.250–7.430)
TCO2: 36 mmol/L (ref 0–100)
TCO2: 38 mmol/L (ref 0–100)
pCO2, Ven: 68.7 mmHg — ABNORMAL HIGH (ref 44.0–60.0)
pO2, Ven: 34 mmHg (ref 32.0–45.0)
pO2, Ven: 34 mmHg (ref 32.0–45.0)

## 2016-08-13 LAB — CBC
HEMATOCRIT: 34.9 % — AB (ref 36.0–46.0)
Hemoglobin: 10.5 g/dL — ABNORMAL LOW (ref 12.0–15.0)
MCH: 23.9 pg — ABNORMAL LOW (ref 26.0–34.0)
MCHC: 30.1 g/dL (ref 30.0–36.0)
MCV: 79.5 fL (ref 78.0–100.0)
PLATELETS: 250 10*3/uL (ref 150–400)
RBC: 4.39 MIL/uL (ref 3.87–5.11)
RDW: 16.4 % — AB (ref 11.5–15.5)
WBC: 5.4 10*3/uL (ref 4.0–10.5)

## 2016-08-13 LAB — BASIC METABOLIC PANEL
Anion gap: 13 (ref 5–15)
BUN: 30 mg/dL — AB (ref 6–20)
CHLORIDE: 104 mmol/L (ref 101–111)
CO2: 29 mmol/L (ref 22–32)
CREATININE: 1.93 mg/dL — AB (ref 0.44–1.00)
Calcium: 7.2 mg/dL — ABNORMAL LOW (ref 8.9–10.3)
GFR, EST AFRICAN AMERICAN: 29 mL/min — AB (ref >60)
GFR, EST NON AFRICAN AMERICAN: 25 mL/min — AB (ref >60)
Glucose, Bld: 62 mg/dL — ABNORMAL LOW (ref 65–99)
POTASSIUM: 3.4 mmol/L — AB (ref 3.5–5.1)
SODIUM: 146 mmol/L — AB (ref 135–145)

## 2016-08-13 LAB — GLUCOSE, CAPILLARY
GLUCOSE-CAPILLARY: 61 mg/dL — AB (ref 65–99)
Glucose-Capillary: 104 mg/dL — ABNORMAL HIGH (ref 65–99)
Glucose-Capillary: 87 mg/dL (ref 65–99)

## 2016-08-13 LAB — PROTIME-INR
INR: 1.17
Prothrombin Time: 15 seconds (ref 11.4–15.2)

## 2016-08-13 SURGERY — RIGHT HEART CATH
Anesthesia: LOCAL

## 2016-08-13 MED ORDER — SODIUM CHLORIDE 0.9% FLUSH: 3.0000 mL | Freq: Two times a day (BID) | INTRAVENOUS | Status: DC

## 2016-08-13 MED ORDER — DEXTROSE 50 % IV SOLN
INTRAVENOUS | Status: AC
  Administered 2016-08-13: 25 mL via INTRAVENOUS
  Filled 2016-08-13: qty 50

## 2016-08-13 MED ORDER — LIDOCAINE HCL (PF) 1 % IJ SOLN
INTRAMUSCULAR | Status: DC | PRN
  Administered 2016-08-13: 5 mL

## 2016-08-13 MED ORDER — POTASSIUM CHLORIDE CRYS ER 20 MEQ PO TBCR: 40.0000 meq | EXTENDED_RELEASE_TABLET | Freq: Once | ORAL | Status: DC

## 2016-08-13 MED ORDER — SODIUM CHLORIDE 0.9% FLUSH: 3.0000 mL | INTRAVENOUS | Status: DC | PRN

## 2016-08-13 MED ORDER — DEXTROSE 50 % IV SOLN
25.0000 mL | Freq: Once | INTRAVENOUS | Status: AC
  Administered 2016-08-13: 25 mL via INTRAVENOUS

## 2016-08-13 MED ORDER — ONDANSETRON HCL 4 MG/2ML IJ SOLN: 4.0000 mg | Freq: Four times a day (QID) | INTRAMUSCULAR | Status: DC | PRN

## 2016-08-13 MED ORDER — FENTANYL CITRATE (PF) 100 MCG/2ML IJ SOLN
INTRAMUSCULAR | Status: AC
  Filled 2016-08-13: qty 2

## 2016-08-13 MED ORDER — SODIUM CHLORIDE 0.9 % IV SOLN: 250.0000 mL | INTRAVENOUS | Status: DC | PRN

## 2016-08-13 MED ORDER — MIDAZOLAM HCL 2 MG/2ML IJ SOLN
INTRAMUSCULAR | Status: DC | PRN
  Administered 2016-08-13 (×2): 1 mg via INTRAVENOUS

## 2016-08-13 MED ORDER — FENTANYL CITRATE (PF) 100 MCG/2ML IJ SOLN
INTRAMUSCULAR | Status: DC | PRN
  Administered 2016-08-13: 25 ug via INTRAVENOUS

## 2016-08-13 MED ORDER — MIDAZOLAM HCL 2 MG/2ML IJ SOLN
INTRAMUSCULAR | Status: AC
  Filled 2016-08-13: qty 2

## 2016-08-13 MED ORDER — SODIUM CHLORIDE 0.9 % IV SOLN
INTRAVENOUS | Status: DC
  Administered 2016-08-13: 09:00:00 via INTRAVENOUS

## 2016-08-13 MED ORDER — ACETAMINOPHEN 325 MG PO TABS: 650.0000 mg | ORAL_TABLET | ORAL | Status: DC | PRN

## 2016-08-13 MED ORDER — HEPARIN (PORCINE) IN NACL 2-0.9 UNIT/ML-% IJ SOLN
INTRAMUSCULAR | Status: DC | PRN
  Administered 2016-08-13: 1000 mL

## 2016-08-13 MED ORDER — LIDOCAINE HCL 1 % IJ SOLN
INTRAMUSCULAR | Status: AC
  Filled 2016-08-13: qty 20

## 2016-08-13 MED ORDER — HEPARIN (PORCINE) IN NACL 2-0.9 UNIT/ML-% IJ SOLN
INTRAMUSCULAR | Status: AC
  Filled 2016-08-13: qty 1000

## 2016-08-13 SURGICAL SUPPLY — 11 items
CATH SWAN GANZ 7F STRAIGHT (CATHETERS) ×1 IMPLANT
COVER PRB 48X5XTLSCP FOLD TPE (BAG) IMPLANT
COVER PROBE 5X48 (BAG) ×2
KIT HEART RIGHT NAMIC (KITS) ×1 IMPLANT
PACK CARDIAC CATHETERIZATION (CUSTOM PROCEDURE TRAY) ×2 IMPLANT
PROTECTION STATION PRESSURIZED (MISCELLANEOUS) ×2
SHEATH PINNACLE 7F 10CM (SHEATH) ×1 IMPLANT
STATION PROTECTION PRESSURIZED (MISCELLANEOUS) IMPLANT
TRANSDUCER W/STOPCOCK (MISCELLANEOUS) ×2 IMPLANT
TUBING ART PRESS 72  MALE/FEM (TUBING) ×1
TUBING ART PRESS 72 MALE/FEM (TUBING) ×1 IMPLANT

## 2016-08-13 NOTE — Progress Notes (Signed)
Removal of Right IJ sheath by S. Prabu. Hemostasis achieved by manual compression for 10 minutes with complication. Bp:168 / 88, HE: 78, SP02: 98%. Sterile 4x4 applied to puncture site with tegaderm to secure. Site healthy with no hematoma, oozing or bruising present. Patient given post removal instructions and able to repeat.

## 2016-08-13 NOTE — Interval H&P Note (Signed)
History and Physical Interval Note:  08/13/2016 10:25 AM  Grace Bradford  has presented today for surgery, with the diagnosis of pulmonary hypertension  The various methods of treatment have been discussed with the patient and family. After consideration of risks, benefits and other options for treatment, the patient has consented to  Procedure(s): Right Heart Cath (N/A) as a surgical intervention .  The patient's history has been reviewed, patient examined, no change in status, stable for surgery.  I have reviewed the patient's chart and labs.  Questions were answered to the patient's satisfaction.     Raheen Capili, Quillian Quince
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
## 2016-08-13 NOTE — Discharge Instructions (Signed)
Angiogram, Care After Take 100 mg of Toresimide twice a day, discontinue 20mg  doses. This sheet gives you information about how to care for yourself after your procedure. Your health care provider may also give you more specific instructions. If you have problems or questions, contact your health care provider. What can I expect after the procedure? After the procedure, it is common to have bruising and tenderness at the catheter insertion area. Follow these instructions at home: Insertion site care   Follow instructions from your health care provider about how to take care of your insertion site. Make sure you:  Wash your hands with soap and water before you change your bandage (dressing). If soap and water are not available, use hand sanitizer.  Change your dressing as told by your health care provider.  Leave stitches (sutures), skin glue, or adhesive strips in place. These skin closures may need to stay in place for 2 weeks or longer. If adhesive strip edges start to loosen and curl up, you may trim the loose edges. Do not remove adhesive strips completely unless your health care provider tells you to do that.  Do not take baths, swim, or use a hot tub until your health care provider approves.  You may shower 24-48 hours after the procedure or as told by your health care provider.  Gently wash the site with plain soap and water.  Pat the area dry with a clean towel.  Do not rub the site. This may cause bleeding.  Do not apply powder or lotion to the site. Keep the site clean and dry.  Check your insertion site every day for signs of infection. Check for:  Redness, swelling, or pain.  Fluid or blood.  Warmth.  Pus or a bad smell. Activity   Rest as told by your health care provider, usually for 1-2 days.  Do not lift anything that is heavier than 10 lbs. (4.5 kg) or as told by your health care provider.  Do not drive for 24 hours if you were given a medicine to help you  relax (sedative).  Do not drive or use heavy machinery while taking prescription pain medicine. General instructions   Return to your normal activities as told by your health care provider, usually in about a week. Ask your health care provider what activities are safe for you.  If the catheter site starts bleeding, lie flat and put pressure on the site. If the bleeding does not stop, get help right away. This is a medical emergency.  Drink enough fluid to keep your urine clear or pale yellow. This helps flush the contrast dye from your body.  Take over-the-counter and prescription medicines only as told by your health care provider.  Keep all follow-up visits as told by your health care provider. This is important. Contact a health care provider if:  You have a fever or chills.  You have redness, swelling, or pain around your insertion site.  You have fluid or blood coming from your insertion site.  The insertion site feels warm to the touch.  You have pus or a bad smell coming from your insertion site.  You have bruising around the insertion site.  You notice blood collecting in the tissue around the catheter site (hematoma). The hematoma may be painful to the touch. Get help right away if:  You have severe pain at the catheter insertion area.  The catheter insertion area swells very fast.  The catheter insertion area is bleeding, and  the bleeding does not stop when you hold steady pressure on the area.  The area near or just beyond the catheter insertion site becomes pale, cool, tingly, or numb. These symptoms may represent a serious problem that is an emergency. Do not wait to see if the symptoms will go away. Get medical help right away. Call your local emergency services (911 in the [place] Do not drive yourself to the hospital. Summary  After the procedure, it is common to have bruising and tenderness at the catheter insertion area.  After the procedure, it is  important to rest and drink plenty of fluids.  Do not take baths, swim, or use a hot tub until your health care provider says it is okay to do so. You may shower 24-48 hours after the procedure or as told by your health care provider.  If the catheter site starts bleeding, lie flat and put pressure on the site. If the bleeding does not stop, get help right away. This is a medical emergency. This information is not intended to replace advice given to you by your health care provider. Make sure you discuss any questions you have with your health care provider. Document Released: 10/29/2004 Document Revised: 03/17/2016 Document Reviewed: 03/17/2016 Elsevier Interactive Patient Education  2017 [employer].

## 2016-08-13 NOTE — Progress Notes (Signed)
CRITICAL VALUE ALERT  Critical value received: CBG 61  Date of notification:  08/13/2016  Time of notification:  0915  Critical value read back:Yes.    Nurse who received alert:  Libby Maw  Hypoglycemic protocol initiated

## 2016-08-18 DIAGNOSIS — H40013 Open angle with borderline findings, low risk, bilateral: Secondary | ICD-10-CM | POA: Diagnosis not present

## 2016-08-18 DIAGNOSIS — H35033 Hypertensive retinopathy, bilateral: Secondary | ICD-10-CM | POA: Diagnosis not present

## 2016-08-18 DIAGNOSIS — H2513 Age-related nuclear cataract, bilateral: Secondary | ICD-10-CM | POA: Diagnosis not present

## 2016-08-18 DIAGNOSIS — E119 Type 2 diabetes mellitus without complications: Secondary | ICD-10-CM | POA: Diagnosis not present

## 2016-08-27 ENCOUNTER — Encounter (HOSPITAL_COMMUNITY): Payer: Self-pay | Admitting: Emergency Medicine

## 2016-08-27 ENCOUNTER — Emergency Department (HOSPITAL_COMMUNITY): Payer: Medicare Other

## 2016-08-27 ENCOUNTER — Emergency Department (HOSPITAL_COMMUNITY)
Admission: EM | Admit: 2016-08-27 | Discharge: 2016-08-27 | Disposition: A | Discharge status: Home / Self Care | Payer: Medicare Other | Attending: Emergency Medicine | Admitting: Emergency Medicine

## 2016-08-27 DIAGNOSIS — Z794 Long term (current) use of insulin: Secondary | ICD-10-CM | POA: Diagnosis not present

## 2016-08-27 DIAGNOSIS — I252 Old myocardial infarction: Secondary | ICD-10-CM | POA: Diagnosis not present

## 2016-08-27 DIAGNOSIS — M545 Low back pain: Secondary | ICD-10-CM | POA: Diagnosis not present

## 2016-08-27 DIAGNOSIS — I130 Hypertensive heart and chronic kidney disease with heart failure and stage 1 through stage 4 chronic kidney disease, or unspecified chronic kidney disease: Secondary | ICD-10-CM | POA: Insufficient documentation

## 2016-08-27 DIAGNOSIS — N183 Chronic kidney disease, stage 3 (moderate): Secondary | ICD-10-CM | POA: Insufficient documentation

## 2016-08-27 DIAGNOSIS — E1122 Type 2 diabetes mellitus with diabetic chronic kidney disease: Secondary | ICD-10-CM | POA: Diagnosis not present

## 2016-08-27 DIAGNOSIS — I5042 Chronic combined systolic (congestive) and diastolic (congestive) heart failure: Secondary | ICD-10-CM | POA: Insufficient documentation

## 2016-08-27 DIAGNOSIS — Z79899 Other long term (current) drug therapy: Secondary | ICD-10-CM | POA: Insufficient documentation

## 2016-08-27 DIAGNOSIS — E1140 Type 2 diabetes mellitus with diabetic neuropathy, unspecified: Secondary | ICD-10-CM | POA: Diagnosis not present

## 2016-08-27 DIAGNOSIS — M79605 Pain in left leg: Secondary | ICD-10-CM | POA: Diagnosis not present

## 2016-08-27 DIAGNOSIS — Z7982 Long term (current) use of aspirin: Secondary | ICD-10-CM | POA: Insufficient documentation

## 2016-08-27 DIAGNOSIS — M1712 Unilateral primary osteoarthritis, left knee: Secondary | ICD-10-CM | POA: Diagnosis not present

## 2016-08-27 LAB — CBG MONITORING, ED: GLUCOSE-CAPILLARY: 79 mg/dL (ref 65–99)

## 2016-08-27 MED ORDER — CYCLOBENZAPRINE HCL 10 MG PO TABS: 5.0000 mg | ORAL_TABLET | Freq: Three times a day (TID) | ORAL | 0 refills | Status: DC | PRN

## 2016-08-27 NOTE — ED Triage Notes (Signed)
Onset one day ago developed lower left back pain radiating to left hip and left lower extremity. Denies trauma or any urinary complaints.

## 2016-08-27 NOTE — Discharge Instructions (Signed)
Begin taking ibuprofen or Tylenol as needed for pain and inflammation. Begin taking Robaxin twice daily for muscle stiffness. Follow up with PCP for further evaluation if needed. Return to ED for worsening pain, trouble walking, loss of bladder function, numbness, weakness, injury, loss of consciousness.

## 2016-08-27 NOTE — ED Notes (Signed)
Pt wanted CBG checked, she said she felt her sugar getting low so she ate a pack of crackers from her purse. CBG: 79, Nurse informed
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
## 2016-08-30 DIAGNOSIS — Z1231 Encounter for screening mammogram for malignant neoplasm of breast: Secondary | ICD-10-CM | POA: Diagnosis not present

## 2016-09-01 ENCOUNTER — Other Ambulatory Visit (HOSPITAL_COMMUNITY): Payer: Self-pay | Admitting: Internal Medicine

## 2016-09-01 DIAGNOSIS — I5022 Chronic systolic (congestive) heart failure: Secondary | ICD-10-CM

## 2016-09-02 ENCOUNTER — Telehealth (HOSPITAL_COMMUNITY): Payer: Self-pay

## 2016-09-02 ENCOUNTER — Other Ambulatory Visit (HOSPITAL_COMMUNITY): Payer: Self-pay | Admitting: Pharmacist

## 2016-09-02 MED ORDER — IVABRADINE HCL 7.5 MG PO TABS: 7.5000 mg | ORAL_TABLET | Freq: Two times a day (BID) | ORAL | 5 refills | Status: DC

## 2016-09-02 NOTE — Telephone Encounter (Signed)
Patient calling CHF clinic triage line to report still not feeling any better since her RHC and diuretic increase with Dr. Haroldine Laws. States her weight fluctuates/"yo-yo's" up and down and shes always SOB and fatigued. Patient scheduled for add-on appointment Monday with Oda Kilts PA-C (next available opening). Patient states she is stable and does not feel the need to be seen same-day. Advised is s/s worsen to return call to clinic or report to ED. Aware and agreeable.  Renee Pain, RN

## 2016-09-03 ENCOUNTER — Telehealth (HOSPITAL_COMMUNITY): Payer: Self-pay | Admitting: Pharmacist

## 2016-09-03 NOTE — Telephone Encounter (Signed)
Corlanor 7.5 mg BID PA approved by [employer] FEP through 09/02/17.   Ruta Hinds. Velva Harman, PharmD, BCPS, CPP Clinical Pharmacist Pager: [phone] Phone: [phone] 09/03/2016 9:16 AM

## 2016-09-06 ENCOUNTER — Ambulatory Visit (HOSPITAL_COMMUNITY)
Admission: RE | Admit: 2016-09-06 | Discharge: 2016-09-06 | Disposition: A | Discharge status: Home / Self Care | Payer: Medicare Other | Source: Ambulatory Visit | Attending: Cardiology | Admitting: Cardiology

## 2016-09-06 ENCOUNTER — Encounter (HOSPITAL_COMMUNITY): Payer: Self-pay

## 2016-09-06 VITALS — BP 136/90 | HR 70 | Wt 244.0 lb

## 2016-09-06 DIAGNOSIS — G4733 Obstructive sleep apnea (adult) (pediatric): Secondary | ICD-10-CM | POA: Diagnosis not present

## 2016-09-06 DIAGNOSIS — Z7902 Long term (current) use of antithrombotics/antiplatelets: Secondary | ICD-10-CM | POA: Insufficient documentation

## 2016-09-06 DIAGNOSIS — R0600 Dyspnea, unspecified: Secondary | ICD-10-CM

## 2016-09-06 DIAGNOSIS — I130 Hypertensive heart and chronic kidney disease with heart failure and stage 1 through stage 4 chronic kidney disease, or unspecified chronic kidney disease: Secondary | ICD-10-CM | POA: Diagnosis not present

## 2016-09-06 DIAGNOSIS — I272 Pulmonary hypertension, unspecified: Secondary | ICD-10-CM

## 2016-09-06 DIAGNOSIS — I252 Old myocardial infarction: Secondary | ICD-10-CM | POA: Insufficient documentation

## 2016-09-06 DIAGNOSIS — E1122 Type 2 diabetes mellitus with diabetic chronic kidney disease: Secondary | ICD-10-CM | POA: Insufficient documentation

## 2016-09-06 DIAGNOSIS — I428 Other cardiomyopathies: Secondary | ICD-10-CM | POA: Diagnosis not present

## 2016-09-06 DIAGNOSIS — Z7982 Long term (current) use of aspirin: Secondary | ICD-10-CM | POA: Insufficient documentation

## 2016-09-06 DIAGNOSIS — I2510 Atherosclerotic heart disease of native coronary artery without angina pectoris: Secondary | ICD-10-CM | POA: Insufficient documentation

## 2016-09-06 DIAGNOSIS — I10 Essential (primary) hypertension: Secondary | ICD-10-CM

## 2016-09-06 DIAGNOSIS — I5022 Chronic systolic (congestive) heart failure: Secondary | ICD-10-CM

## 2016-09-06 DIAGNOSIS — E669 Obesity, unspecified: Secondary | ICD-10-CM | POA: Diagnosis not present

## 2016-09-06 DIAGNOSIS — K219 Gastro-esophageal reflux disease without esophagitis: Secondary | ICD-10-CM | POA: Diagnosis not present

## 2016-09-06 DIAGNOSIS — Z794 Long term (current) use of insulin: Secondary | ICD-10-CM | POA: Diagnosis not present

## 2016-09-06 DIAGNOSIS — N184 Chronic kidney disease, stage 4 (severe): Secondary | ICD-10-CM

## 2016-09-06 DIAGNOSIS — Z79899 Other long term (current) drug therapy: Secondary | ICD-10-CM | POA: Diagnosis not present

## 2016-09-06 LAB — BASIC METABOLIC PANEL WITH GFR
Anion gap: 13 (ref 5–15)
BUN: 32 mg/dL — ABNORMAL HIGH (ref 6–20)
CO2: 30 mmol/L (ref 22–32)
Calcium: 8.1 mg/dL — ABNORMAL LOW (ref 8.9–10.3)
Chloride: 98 mmol/L — ABNORMAL LOW (ref 101–111)
Creatinine, Ser: 2.24 mg/dL — ABNORMAL HIGH (ref 0.44–1.00)
GFR calc Af Amer: 25 mL/min — ABNORMAL LOW (ref >60)
GFR calc non Af Amer: 21 mL/min — ABNORMAL LOW (ref >60)
Glucose, Bld: 120 mg/dL — ABNORMAL HIGH (ref 65–99)
Potassium: 4.8 mmol/L (ref 3.5–5.1)
Sodium: 141 mmol/L (ref 135–145)

## 2016-09-06 LAB — CBC
HCT: 40.1 % (ref 36.0–46.0)
Hemoglobin: 12 g/dL (ref 12.0–15.0)
MCH: 24.1 pg — ABNORMAL LOW (ref 26.0–34.0)
MCHC: 29.9 g/dL — ABNORMAL LOW (ref 30.0–36.0)
MCV: 80.7 fL (ref 78.0–100.0)
Platelets: 201 K/uL (ref 150–400)
RBC: 4.97 MIL/uL (ref 3.87–5.11)
RDW: 17.6 % — ABNORMAL HIGH (ref 11.5–15.5)
WBC: 4.4 K/uL (ref 4.0–10.5)

## 2016-09-06 LAB — BRAIN NATRIURETIC PEPTIDE: B NATRIURETIC PEPTIDE 5: 395.4 pg/mL — AB (ref 0.0–100.0)

## 2016-09-06 LAB — TSH: TSH: 1.097 u[IU]/mL (ref 0.350–4.500)

## 2016-09-06 NOTE — Patient Instructions (Signed)
Routine lab work today. Will notify you of abnormal results, otherwise no news is good news!  Will refer you to pulmonary rehab at [facility]. They will call you to set up initial appointment.  Follow up as scheduled with Dr. Haroldine Laws.  Do the following things EVERYDAY: 1) Weigh yourself in the morning before breakfast. Write it down and keep it in a log. 2) Take your medicines as prescribed 3) Eat low salt foods-Limit salt (sodium) to 2000 mg per day.  4) Stay as active as you can everyday 5) Limit all fluids for the day to less than 2 liters
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
## 2016-09-08 ENCOUNTER — Telehealth (HOSPITAL_COMMUNITY): Payer: Self-pay | Admitting: Cardiology

## 2016-09-08 NOTE — Telephone Encounter (Signed)
Pt called to request a physician letter  Reports she was supposed to fly to [facility] 09/03/16 and was unable to travel as she was not feeling well and  increased SOB. Only way for patient to get refund is if she sends a letter.   Please advise

## 2016-09-08 NOTE — Telephone Encounter (Signed)
Ok to provide a letter saying that she is too sick to fly due to her HF

## 2016-09-14 ENCOUNTER — Encounter (HOSPITAL_COMMUNITY): Payer: Self-pay | Admitting: *Deleted

## 2016-09-14 DIAGNOSIS — M85852 Other specified disorders of bone density and structure, left thigh: Secondary | ICD-10-CM | POA: Diagnosis not present

## 2016-09-14 NOTE — Telephone Encounter (Signed)
Letter completed and signed by Dr Haroldine Laws, attempted to call pt and Left message to call back

## 2016-09-14 NOTE — Telephone Encounter (Signed)
Pt came by and picked up letter

## 2016-09-21 ENCOUNTER — Other Ambulatory Visit (HOSPITAL_COMMUNITY): Payer: Self-pay | Admitting: Pharmacist

## 2016-09-21 DIAGNOSIS — M150 Primary generalized (osteo)arthritis: Secondary | ICD-10-CM | POA: Diagnosis not present

## 2016-09-21 DIAGNOSIS — M1A09X0 Idiopathic chronic gout, multiple sites, without tophus (tophi): Secondary | ICD-10-CM | POA: Diagnosis not present

## 2016-09-21 DIAGNOSIS — N184 Chronic kidney disease, stage 4 (severe): Secondary | ICD-10-CM | POA: Diagnosis not present

## 2016-09-21 DIAGNOSIS — M170 Bilateral primary osteoarthritis of knee: Secondary | ICD-10-CM | POA: Diagnosis not present

## 2016-09-21 MED ORDER — TORSEMIDE 100 MG PO TABS: 100.0000 mg | ORAL_TABLET | Freq: Two times a day (BID) | ORAL | 5 refills | Status: DC

## 2016-09-22 ENCOUNTER — Other Ambulatory Visit (HOSPITAL_COMMUNITY): Payer: Self-pay | Admitting: *Deleted

## 2016-09-22 ENCOUNTER — Telehealth (HOSPITAL_COMMUNITY): Payer: Self-pay | Admitting: *Deleted

## 2016-09-22 DIAGNOSIS — I272 Pulmonary hypertension, unspecified: Secondary | ICD-10-CM

## 2016-09-22 NOTE — Telephone Encounter (Signed)
Molly with Pulm rehab called requesting new orders for patient. Per Medicare the order must be signed by an MD not PA. New order sent with Dr.Bensimhon signature.

## 2016-09-23 ENCOUNTER — Ambulatory Visit (INDEPENDENT_AMBULATORY_CARE_PROVIDER_SITE_OTHER): Payer: Medicare Other | Admitting: Pulmonary Disease

## 2016-09-23 ENCOUNTER — Encounter: Payer: Self-pay | Admitting: Pulmonary Disease

## 2016-09-23 VITALS — BP 132/78 | HR 60 | Ht 62.0 in | Wt 242.4 lb

## 2016-09-23 DIAGNOSIS — G4733 Obstructive sleep apnea (adult) (pediatric): Secondary | ICD-10-CM | POA: Diagnosis not present

## 2016-09-23 DIAGNOSIS — J398 Other specified diseases of upper respiratory tract: Secondary | ICD-10-CM

## 2016-09-23 DIAGNOSIS — I272 Pulmonary hypertension, unspecified: Secondary | ICD-10-CM | POA: Diagnosis not present

## 2016-09-23 NOTE — Progress Notes (Signed)
Current Outpatient Prescriptions on File Prior to Visit  Medication Sig  . acetaminophen (TYLENOL) 500 MG tablet Take 1,000 mg by mouth every 6 (six) hours as needed for mild pain or moderate pain.   Rodman Key EX Apply 1 application topically 2 (two) times daily as needed (pain).  Marland Kitchen aspirin EC 81 MG tablet Take 81 mg by mouth daily.  . B-D ULTRAFINE III SHORT PEN 31G X 8 MM MISC   . BD PEN NEEDLE NANO U/F 32G X 4 MM MISC   . benzonatate (TESSALON) 200 MG capsule Take 200 mg by mouth 3 (three) times daily as needed for cough.  . calcitRIOL (ROCALTROL) 0.25 MCG capsule Take 0.25 mcg by mouth daily.  . Calcium Carb-Cholecalciferol (CALCIUM 600 + D PO) Take 1 tablet by mouth daily.  . carvedilol (COREG) 6.25 MG tablet Take 1.5 tablets (9.375 mg total) by mouth 2 (two) times daily with a meal.  . CASCARA SAGRADA PO Take 450 mg by mouth daily.  . cetirizine (ZYRTEC) 10 MG tablet Take 10 mg by mouth daily.   . chlorpheniramine-HYDROcodone (TUSSIONEX) 10-8 MG/5ML SUER Take 5 mLs by mouth every 12 (twelve) hours.  . clopidogrel (PLAVIX) 75 MG tablet Take 75 mg by mouth daily.  . cyclobenzaprine (FLEXERIL) 10 MG tablet Take 0.5 tablets (5 mg total) by mouth 3 (three) times daily as needed for muscle spasms.  . diazepam (VALIUM) 5 MG tablet Take 5 mg by mouth 2 (two) times daily.   Marland Kitchen ENTRESTO 97-103 MG take 1 tablet by mouth twice a day  . febuxostat (ULORIC) 40 MG tablet Take 40 mg by mouth daily.  . fluticasone (FLONASE) 50 MCG/ACT nasal spray Place 2 sprays into both nostrils 2 (two) times daily as needed for allergies.   Marland Kitchen FREESTYLE LITE test strip   . Ginger, Zingiber officinalis, (GINGER ROOT PO) Take 1 capsule by mouth daily.  Marland Kitchen guaiFENesin (MUCINEX) 600 MG 12 hr tablet Take 1,200 mg by mouth 2 (two) times daily.   Marland Kitchen Histamine Dihydrochloride (AUSTRALIAN DREAM ARTHRITIS EX) Apply 1 application topically 2 (two) times daily as needed (arthritis pain).  Marland Kitchen HYDROcodone-acetaminophen (NORCO) 7.5-325 MG  per tablet Take 1 tablet by mouth 3 (three) times daily as needed for moderate pain.   . hydrOXYzine (ATARAX/VISTARIL) 25 MG tablet Take 25 mg by mouth every 6 (six) hours as needed for itching.  . Insulin Detemir (LEVEMIR FLEXPEN) 100 UNIT/ML Pen Inject 60-80 Units into the skin 2 (two) times daily. Take 60 units in the am and 80 units in the pm  . ivabradine (CORLANOR) 7.5 MG TABS tablet Take 1 tablet (7.5 mg total) by mouth 2 (two) times daily with a meal.  . Lancets (FREESTYLE) lancets   . levalbuterol (XOPENEX) 0.63 MG/3ML nebulizer solution Take 3 mLs (0.63 mg total) by nebulization every 6 (six) hours as needed for wheezing or shortness of breath.  . levothyroxine (SYNTHROID, LEVOTHROID) 125 MCG tablet Take 125 mcg by mouth daily.  Marland Kitchen lidocaine (XYLOCAINE) 5 % ointment Apply 1 application topically as needed (numbs skin when itching).  . Liraglutide (VICTOZA) 18 MG/3ML SOPN Inject 1.8 mg into the skin at bedtime.  . metolazone (ZAROXOLYN) 2.5 MG tablet take 1 tablet by mouth once daily if needed (Patient taking differently: take 1 tablet by mouth once daily if needed for fluid)  . Multiple Vitamin (MULTIVITAMIN WITH MINERALS) TABS tablet Take 1 tablet by mouth daily.  Marland Kitchen omeprazole (PRILOSEC) 20 MG capsule Take 1 capsule (20 mg total)  by mouth 2 (two) times daily.  [place] Bicarbonate (ZEGERID) 20-1100 MG CAPS capsule Take 1 capsule by mouth daily.  . ondansetron (ZOFRAN ODT) 4 MG disintegrating tablet Take 1 tablet (4 mg total) by mouth every 8 (eight) hours as needed for nausea or vomiting.  Marland Kitchen OVER THE COUNTER MEDICATION Apply 1 application topically daily. magnilife  . OVER THE COUNTER MEDICATION Apply 1 application topically 2 (two) times daily. synodrin  . OVER THE COUNTER MEDICATION Apply 1 application topically 3 times/day as needed-between meals & bedtime (joint pain). 2 old goats  . Podiatric Products (DIADERM FOOT REJUVENATING EX) Apply 1 application topically 2 (two)  times daily.  . potassium chloride SA (K-DUR,KLOR-CON) 20 MEQ tablet Take 1 tablet (20 mEq total) by mouth 2 (two) times daily.  . pravastatin (PRAVACHOL) 80 MG tablet Take 80 mg by mouth Daily.   . pregabalin (LYRICA) 75 MG capsule Take 75 mg by mouth 2 (two) times daily.  . Probiotic Product (PROBIOTIC PO) Take 1 capsule by mouth daily.  . sildenafil (REVATIO) 20 MG tablet TAKE 3 TABLETS (60MG ) BY MOUTH THREE TIMES DAILY. CALL [phone] FOR REFILLS (GENERIC FOR REVATIO)  . tolterodine (DETROL LA) 4 MG 24 hr capsule Take 8 mg by mouth daily.  Marland Kitchen torsemide (DEMADEX) 100 MG tablet Take 1 tablet (100 mg total) by mouth 2 (two) times daily.  Marland Kitchen UNABLE TO FIND Apply 4 drops topically 2 (two) times daily. Med Name: PENSAID   No current facility-administered medications on file prior to visit.      Chief Complaint  Patient presents with  . Follow-up    Pt states that her dental appliance is currently getting repaired and she has been without it for about 1 week.      Pulmonary tests PFT 09/24/13 >> FEV1 1.29 (73%), FEV1% 83, TLC 5.10 (107%), DLCO 27%, +BD CT chest 09/27/13 >> severe tracheomalacia, mild air trapping b/l  Cardiac tests RHC 11/26/14 >> RA 13, RV 73/8/14, PA 68/18/42, PCW 18, CI 3.2, PVR 3.6 WU Echo 07/20/16 >> EF 45 to 50%, grade 2 DD, PAS 83 mmHg RHC 08/13/16 >> RA 15, RV 78/17, PA 77/28(48), PCQ 28, CI 2.4, PVR 4  Sleep tests PSG 11/25/13 >> AHI 15, SaO2 low 89% ONO with oral appliance 07/07/15 >> test time 9 hrs 16 min.  Baseline SpO2 94%, low SpO2 86%.  Spent 1 min 36 sec with SpO2 < 88%  Pulmonary tests PFT 09/24/13 >> FEV1 1.29 (73%), FEV1% 83, TLC 5.10 (107%), DLCO 27%, +BD CT chest 11/15/15 >> minimal opacification of lingula, RML collapse  Past medical history DM, non ischemic CM, combined CHF, CAD, HTN, HLD, CKD  Past surgical history, Family history, Social history, Allergies reviewed  Vital Signs BP 132/78 (BP Location: Left Arm, Cuff Size: Normal)   Pulse 60    Ht 5\' 2"  (1.575 m)   Wt 242 lb 6.4 oz (110 kg)   SpO2 100%   BMI 44.34 kg/m   History of Present Illness JERIYAH GRANLUND is a 70 y.o. female with obstructive sleep apnea, and tracheomalacia.  She had [place] cath again.  Showed persistent elevation of PA pressures.  Echo showed LV systolic and diastolic dysfunction.  She has been getting more short of breath.  She is feeling sleepy all the time, and doesn't matter how much she sleeps.  Uses oral appliance nightly.  Physical Exam  General - pleasant Eyes - pupils reactive ENT - no sinus tenderness, no oral exudate, no  LAN, MP 4 Cardiac - regular, no murmur Chest - no wheeze, rales Abd - soft, non tender Ext - no edema Skin - no rashes Neuro - normal strength Psych - normal mood   CMP Latest Ref Rng & Units 09/06/2016 08/13/2016 07/16/2016  Glucose 65 - 99 mg/dL 120(H) 62(L) 136(H)  BUN 6 - 20 mg/dL 32(H) 30(H) 46(H)  Creatinine 0.44 - 1.00 mg/dL 2.24(H) 1.93(H) 2.26(H)  Sodium 135 - 145 mmol/L 141 146(H) 143  Potassium 3.5 - 5.1 mmol/L 4.8 3.4(L) 3.6  Chloride 101 - 111 mmol/L 98(L) 104 99(L)  CO2 22 - 32 mmol/L 30 29 29   Calcium 8.9 - 10.3 mg/dL 8.1(L) 7.2(L) 6.5(L)  Total Protein 6.5 - 8.1 g/dL - - -  Total Bilirubin 0.3 - 1.2 mg/dL - - -  Alkaline Phos 38 - 126 U/L - - -  AST 15 - 41 U/L - - -  ALT 14 - 54 U/L - - -    CBC Latest Ref Rng & Units 09/06/2016 08/13/2016 07/10/2016  WBC 4.0 - 10.5 K/uL 4.4 5.4 -  Hemoglobin 12.0 - 15.0 g/dL 12.0 10.5(L) 12.2  Hematocrit 36.0 - 46.0 % 40.1 34.9(L) 36.0  Platelets 150 - 400 K/uL 201 250 -    ABG    Component Value Date/Time   PHART 7.417 (H) 07/10/2008 1001   PCO2ART 43.5 07/10/2008 1001   PO2ART 145.0 (H) 07/10/2008 1001   HCO3 34.1 (H) 08/13/2016 1042   TCO2 36 08/13/2016 1042   ACIDBASEDEF 3.0 (H) 07/09/2008 0124   O2SAT 58.0 08/13/2016 1042    Discussion She has sleep disordered breathing in the setting or OSA and tracheomalacia.  She has persistent and  progressive pulmonary hypertension.  I explained to her how oral appliance is only partially addressing her sleep disordered breathing, and how this can be negatively impacting her CHF and pulmonary hypertension management.  Assessment/Plan  Sleep disordered breathing with OSA and tracheomalacia. - will arrange for in lab sleep study to assess current status of sleep apnea - she is then willing to retry PAP therapy - continue oral appliance for now >> if she has difficulty tolerating PAP, then might use combination of PAP with oral appliance to allow for lower PAP settings  WHO group 2 and 3 pulmonary hypertension. - she has f/u with cardiology  Obesity. - discussed importance of weight loss   Patient Instructions  Will arrange for in lab sleep study Will call to arrange for follow up after sleep study reviewed   Time spent 27 minutes  Chesley Mires, MD [facility] Pager:  [phone] 09/23/2016, 12:45 PM

## 2016-09-23 NOTE — Patient Instructions (Signed)
Will arrange for in lab sleep study Will call to arrange for follow up after sleep study reviewed  

## 2016-09-26 ENCOUNTER — Ambulatory Visit (HOSPITAL_BASED_OUTPATIENT_CLINIC_OR_DEPARTMENT_OTHER): Payer: Medicare Other | Attending: Pulmonary Disease | Admitting: Pulmonary Disease

## 2016-09-26 VITALS — Ht 62.0 in | Wt 241.0 lb

## 2016-09-26 DIAGNOSIS — I272 Pulmonary hypertension, unspecified: Secondary | ICD-10-CM | POA: Insufficient documentation

## 2016-09-26 DIAGNOSIS — J398 Other specified diseases of upper respiratory tract: Secondary | ICD-10-CM | POA: Diagnosis not present

## 2016-09-26 DIAGNOSIS — G4733 Obstructive sleep apnea (adult) (pediatric): Secondary | ICD-10-CM

## 2016-10-04 ENCOUNTER — Ambulatory Visit (HOSPITAL_COMMUNITY): Payer: Self-pay

## 2016-10-05 ENCOUNTER — Encounter (HOSPITAL_COMMUNITY): Payer: Self-pay | Admitting: Internal Medicine

## 2016-10-05 ENCOUNTER — Ambulatory Visit (HOSPITAL_COMMUNITY)
Admission: RE | Admit: 2016-10-05 | Discharge: 2016-10-05 | Disposition: A | Discharge status: Home / Self Care | Payer: Medicare Other | Source: Ambulatory Visit | Attending: Internal Medicine | Admitting: Internal Medicine

## 2016-10-05 VITALS — BP 160/72 | HR 93 | Wt 241.8 lb

## 2016-10-05 DIAGNOSIS — G4733 Obstructive sleep apnea (adult) (pediatric): Secondary | ICD-10-CM | POA: Diagnosis not present

## 2016-10-05 DIAGNOSIS — I428 Other cardiomyopathies: Secondary | ICD-10-CM | POA: Insufficient documentation

## 2016-10-05 DIAGNOSIS — Z7902 Long term (current) use of antithrombotics/antiplatelets: Secondary | ICD-10-CM | POA: Insufficient documentation

## 2016-10-05 DIAGNOSIS — K766 Portal hypertension: Secondary | ICD-10-CM

## 2016-10-05 DIAGNOSIS — I272 Pulmonary hypertension, unspecified: Secondary | ICD-10-CM | POA: Diagnosis not present

## 2016-10-05 DIAGNOSIS — K219 Gastro-esophageal reflux disease without esophagitis: Secondary | ICD-10-CM | POA: Insufficient documentation

## 2016-10-05 DIAGNOSIS — I252 Old myocardial infarction: Secondary | ICD-10-CM | POA: Insufficient documentation

## 2016-10-05 DIAGNOSIS — I130 Hypertensive heart and chronic kidney disease with heart failure and stage 1 through stage 4 chronic kidney disease, or unspecified chronic kidney disease: Secondary | ICD-10-CM | POA: Diagnosis not present

## 2016-10-05 DIAGNOSIS — I2510 Atherosclerotic heart disease of native coronary artery without angina pectoris: Secondary | ICD-10-CM | POA: Insufficient documentation

## 2016-10-05 DIAGNOSIS — E1122 Type 2 diabetes mellitus with diabetic chronic kidney disease: Secondary | ICD-10-CM | POA: Diagnosis not present

## 2016-10-05 DIAGNOSIS — E669 Obesity, unspecified: Secondary | ICD-10-CM | POA: Diagnosis not present

## 2016-10-05 DIAGNOSIS — I2721 Secondary pulmonary arterial hypertension: Secondary | ICD-10-CM

## 2016-10-05 DIAGNOSIS — Z794 Long term (current) use of insulin: Secondary | ICD-10-CM | POA: Insufficient documentation

## 2016-10-05 DIAGNOSIS — N184 Chronic kidney disease, stage 4 (severe): Secondary | ICD-10-CM | POA: Insufficient documentation

## 2016-10-05 DIAGNOSIS — Z79899 Other long term (current) drug therapy: Secondary | ICD-10-CM | POA: Diagnosis not present

## 2016-10-05 DIAGNOSIS — I5022 Chronic systolic (congestive) heart failure: Secondary | ICD-10-CM

## 2016-10-05 DIAGNOSIS — Z7982 Long term (current) use of aspirin: Secondary | ICD-10-CM | POA: Diagnosis not present

## 2016-10-05 DIAGNOSIS — Z8673 Personal history of transient ischemic attack (TIA), and cerebral infarction without residual deficits: Secondary | ICD-10-CM | POA: Diagnosis not present

## 2016-10-05 LAB — BASIC METABOLIC PANEL
Anion gap: 13 (ref 5–15)
BUN: 38 mg/dL — ABNORMAL HIGH (ref 6–20)
CALCIUM: 7.8 mg/dL — AB (ref 8.9–10.3)
CHLORIDE: 99 mmol/L — AB (ref 101–111)
CO2: 28 mmol/L (ref 22–32)
CREATININE: 2.31 mg/dL — AB (ref 0.44–1.00)
GFR calc Af Amer: 24 mL/min — ABNORMAL LOW (ref >60)
GFR calc non Af Amer: 20 mL/min — ABNORMAL LOW (ref >60)
GLUCOSE: 167 mg/dL — AB (ref 65–99)
Potassium: 3.5 mmol/L (ref 3.5–5.1)
Sodium: 140 mmol/L (ref 135–145)

## 2016-10-05 NOTE — Addendum Note (Signed)
Encounter addended by: Scarlette Calico, RN on: 10/05/2016 12:43 PM<BR>    Actions taken: Order list changed, Diagnosis association updated, Sign clinical note

## 2016-10-05 NOTE — Patient Instructions (Signed)
Lab today  We will contact you in 6 months to schedule your next appointment.  
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
## 2016-10-05 NOTE — Addendum Note (Signed)
Encounter addended by: Jolaine Artist, MD on: 10/05/2016 12:39 PM<BR>    Actions taken: Sign clinical note

## 2016-10-06 ENCOUNTER — Telehealth: Payer: Self-pay | Admitting: Pulmonary Disease

## 2016-10-06 DIAGNOSIS — G4733 Obstructive sleep apnea (adult) (pediatric): Secondary | ICD-10-CM

## 2016-10-06 NOTE — Telephone Encounter (Signed)
Left message for patient to contact office.

## 2016-10-06 NOTE — Telephone Encounter (Signed)
PSG 09/26/16 >> AHI 11.1, SpO2 low 80%.  CPAP 11 cm H2O >> AHI 4.6.   Will have my nurse inform pt that sleep study shows mild sleep apnea.  Please arrange for CPAP 11 cm H2O with heated humidity and mask of choice.  Schedule ROV with me or NP two months after CPAP set up.

## 2016-10-06 NOTE — Procedures (Signed)
   Patient Name: Grace Bradford, Grace Bradford Study Date: 09/26/2016 Gender: Female D.O.B: March 28, 1946 Age (years): 29 Referring Provider: Chesley Mires MD, ABSM Height (inches): 62 Interpreting Physician: Chesley Mires MD, ABSM Weight (lbs): 241 RPSGT: Madelon Lips BMI: 73 MRN: [id number] Neck Size: 15.00  CLINICAL INFORMATION Sleep Study Type: Split Night CPAP  Indication for sleep study: history of obstructive sleep apnea, tracheomalacia, pulmonary hypertension.  Epworth Sleepiness Score: 7  SLEEP STUDY TECHNIQUE As per the AASM Manual for the Scoring of Sleep and Associated Events v2.3 (April 2016) with a hypopnea requiring 4% desaturations.  The channels recorded and monitored were frontal, central and occipital EEG, electrooculogram (EOG), submentalis EMG (chin), nasal and oral airflow, thoracic and abdominal wall motion, anterior tibialis EMG, snore microphone, electrocardiogram, and pulse oximetry. Continuous positive airway pressure (CPAP) was initiated when the patient met split night criteria and was titrated according to treat sleep-disordered breathing.  MEDICATIONS Medications self-administered by patient taken the night of the study : N/A  RESPIRATORY PARAMETERS Diagnostic  Total AHI (/hr): 11.1 RDI (/hr): 18.6 OA Index (/hr): 0.7 CA Index (/hr): 0.0 REM AHI (/hr): N/A NREM AHI (/hr): 11.1 Supine AHI (/hr): 8.4 Non-supine AHI (/hr): 16.61 Min O2 Sat (%): 80.00 Mean O2 (%): 92.11 Time below 88% (min): 5.6    Titration  Optimal Pressure (cm): 11 AHI at Optimal Pressure (/hr): 4.6 Min O2 at Optimal Pressure (%): 87.0 Supine % at Optimal (%): 100 Sleep % at Optimal (%): 99    SLEEP ARCHITECTURE The recording time for the entire night was 403.6 minutes.  During a baseline period of 194.4 minutes, the patient slept for 161.7 minutes in REM and nonREM, yielding a sleep efficiency of 83.2%. Sleep onset after lights out was 2.2 minutes with a REM latency of N/A minutes. The patient  spent 28.45% of the night in stage N1 sleep, 71.55% in stage N2 sleep, 0.00% in stage N3 and 0.00% in REM.  During the titration period of 202.9 minutes, the patient slept for 180.9 minutes in REM and nonREM, yielding a sleep efficiency of 89.1%. Sleep onset after CPAP initiation was 2.6 minutes with a REM latency of N/A minutes. The patient spent 20.95% of the night in stage N1 sleep, 79.05% in stage N2 sleep, 0.00% in stage N3 and 0.00% in REM.  CARDIAC DATA The 2 lead EKG demonstrated sinus rhythm. The mean heart rate was 65.21 beats per minute. Other EKG findings include: PVCs.  LEG MOVEMENT DATA The total Periodic Limb Movements of Sleep (PLMS) were 0. The PLMS index was 0.00 .  IMPRESSIONS - Mild obstructive sleep apnea occurred during the diagnostic portion of the study (AHI = 11.1 /hour). An optimal PAP pressure was selected for this patient ( 11 cm of water) - Moderate oxygen desaturation was noted during the diagnostic portion of the study (Min O2 =80.00%).  DIAGNOSIS - Obstructive Sleep Apnea (327.23 [G47.33 ICD-10])  RECOMMENDATIONS - Trial of CPAP therapy on 11 cm H2O. - She was fitted with a Medium Wide size Philips Respironics Full Face Mask Dreamwear mask and heated humidification.  [Electronically signed] 10/06/2016 09:10 AM  Chesley Mires MD, ABSM Diplomate, American Board of Sleep Medicine   NPI: [id number]

## 2016-10-07 NOTE — Telephone Encounter (Signed)
Spoke with pt and notified of results per Dr. Halford Chessman. Pt verbalized understanding and denied any questions. She is aware to call for 2 month ov with VS or NP after she gets the CPAP  Order sent to [facility]

## 2016-10-13 ENCOUNTER — Telehealth: Payer: Self-pay | Admitting: Pulmonary Disease

## 2016-10-13 NOTE — Telephone Encounter (Signed)
I have called and lmomtcb x 1 for the pt.

## 2016-10-14 NOTE — Telephone Encounter (Signed)
lmomtcb x 2  

## 2016-10-15 ENCOUNTER — Encounter (HOSPITAL_COMMUNITY)
Admission: RE | Admit: 2016-10-15 | Discharge: 2016-10-15 | Disposition: A | Discharge status: Home / Self Care | Payer: Medicare Other | Source: Ambulatory Visit | Attending: Internal Medicine | Admitting: Internal Medicine

## 2016-10-15 ENCOUNTER — Encounter (HOSPITAL_COMMUNITY): Payer: Self-pay

## 2016-10-15 VITALS — BP 144/63 | HR 57 | Resp 18 | Ht 62.0 in | Wt 244.9 lb

## 2016-10-15 DIAGNOSIS — I5022 Chronic systolic (congestive) heart failure: Secondary | ICD-10-CM | POA: Diagnosis not present

## 2016-10-15 DIAGNOSIS — I272 Pulmonary hypertension, unspecified: Secondary | ICD-10-CM
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
## 2016-10-15 NOTE — Telephone Encounter (Signed)
lmtcb x3 for pt. 

## 2016-10-18 NOTE — Telephone Encounter (Signed)
Spoke with patient. She stated that she is no longer interested in a CPAP machine and wants to use an oral appliance for OSA. When asked if she had already seen someone about device, she said she had seen Dr. Bennett Scrape at [facility] here in [place].   Will forward this message to VS as a FYI.

## 2016-11-01 DIAGNOSIS — I10 Essential (primary) hypertension: Secondary | ICD-10-CM | POA: Diagnosis not present

## 2016-11-01 DIAGNOSIS — N183 Chronic kidney disease, stage 3 (moderate): Secondary | ICD-10-CM | POA: Diagnosis not present

## 2016-11-01 DIAGNOSIS — N2581 Secondary hyperparathyroidism of renal origin: Secondary | ICD-10-CM | POA: Diagnosis not present

## 2016-11-01 DIAGNOSIS — Z6841 Body Mass Index (BMI) 40.0 and over, adult: Secondary | ICD-10-CM | POA: Diagnosis not present

## 2016-11-01 DIAGNOSIS — I2721 Secondary pulmonary arterial hypertension: Secondary | ICD-10-CM | POA: Diagnosis not present

## 2016-11-01 DIAGNOSIS — E8770 Fluid overload, unspecified: Secondary | ICD-10-CM | POA: Diagnosis not present

## 2016-11-01 DIAGNOSIS — G4730 Sleep apnea, unspecified: Secondary | ICD-10-CM | POA: Diagnosis not present

## 2016-11-18 ENCOUNTER — Encounter (HOSPITAL_COMMUNITY): Payer: Medicare Other

## 2016-11-23 ENCOUNTER — Encounter (HOSPITAL_COMMUNITY)
Admission: RE | Admit: 2016-11-23 | Discharge: 2016-11-23 | Disposition: A | Discharge status: Home / Self Care | Payer: Medicare Other | Source: Ambulatory Visit | Attending: Internal Medicine | Admitting: Internal Medicine

## 2016-11-23 DIAGNOSIS — I272 Pulmonary hypertension, unspecified: Secondary | ICD-10-CM

## 2016-11-23 DIAGNOSIS — I5022 Chronic systolic (congestive) heart failure: Secondary | ICD-10-CM | POA: Diagnosis not present

## 2016-11-25 ENCOUNTER — Encounter (HOSPITAL_COMMUNITY): Admission: RE | Admit: 2016-11-25 | Payer: Medicare Other | Source: Ambulatory Visit

## 2016-11-25 ENCOUNTER — Other Ambulatory Visit (HOSPITAL_COMMUNITY): Payer: Self-pay | Admitting: Internal Medicine

## 2016-11-25 DIAGNOSIS — H40023 Open angle with borderline findings, high risk, bilateral: Secondary | ICD-10-CM | POA: Diagnosis not present
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
## 2016-11-30 ENCOUNTER — Encounter (HOSPITAL_COMMUNITY): Payer: Medicare Other

## 2016-12-02 ENCOUNTER — Telehealth (HOSPITAL_COMMUNITY): Payer: Self-pay | Admitting: Endocrinology

## 2016-12-02 ENCOUNTER — Encounter (HOSPITAL_COMMUNITY): Payer: Medicare Other

## 2016-12-07 ENCOUNTER — Encounter (HOSPITAL_COMMUNITY)
Admission: RE | Admit: 2016-12-07 | Discharge: 2016-12-07 | Disposition: A | Discharge status: Home / Self Care | Payer: Medicare Other | Source: Ambulatory Visit | Attending: Internal Medicine | Admitting: Internal Medicine

## 2016-12-07 VITALS — Wt 238.8 lb

## 2016-12-07 DIAGNOSIS — I272 Pulmonary hypertension, unspecified: Secondary | ICD-10-CM

## 2016-12-07 DIAGNOSIS — I5022 Chronic systolic (congestive) heart failure: Secondary | ICD-10-CM | POA: Insufficient documentation

## 2016-12-07 NOTE — Progress Notes (Signed)
Daily Session Note  Patient Details  Name: Grace Bradford MRN: [id number] Date of Birth: [birth year] Referring Provider:     Pulmonary Rehab Walk Test from 11/23/2016 in [place]  Referring Provider  Dr. Haroldine Laws      Encounter Date: 12/07/2016  Check In:     Session Check In - 12/07/16 1541      Check-In   Location [facility]   Staff Present Su Hilt, MS, ACSM RCEP, Exercise Physiologist;Orva Gwaltney Ysidro Evert, RN;Olinty Celesta Aver, MS, ACSM CEP, Exercise Physiologist;Annedrea Rosezella [place], RN, [place] physician immediately available to respond to emergencies [facility] immediately available   Physician(s) Dr. Clementeen Graham   Medication changes reported     No   Fall or balance concerns reported    No   Tobacco Cessation No Change   Warm-up and Cool-down Performed as group-led instruction   Resistance Training Performed Yes   VAD Patient? No     Pain Assessment   Currently in Pain? No/denies      Capillary Blood Glucose: No results found for this or any previous visit (from the past 24 hour(s)).     POCT Glucose - 12/07/16 1558      POCT Blood Glucose   Pre-Exercise 220 mg/dL   Post-Exercise 165 mg/dL         Exercise Prescription Changes - 12/07/16 1500      Response to Exercise   Blood Pressure (Admit) 100/60   Blood Pressure (Exercise) 162/88   Blood Pressure (Exit) 102/60   Heart Rate (Admit) 58 bpm   Heart Rate (Exercise) 68 bpm   Heart Rate (Exit) 58 bpm   Oxygen Saturation (Admit) 94 %   Oxygen Saturation (Exercise) 91 %   Oxygen Saturation (Exit) 94 %   Rating of Perceived Exertion (Exercise) 13   Perceived Dyspnea (Exercise) 3   Duration Progress to 45 minutes of aerobic exercise without signs/symptoms of physical distress   Intensity Other (comment)  40-80% of HRR     Progression   Progression Continue to progress workloads to maintain intensity without signs/symptoms of physical  distress.     Resistance Training   Training Prescription Yes   Weight orange bands   Reps 10-15   Time 10 Minutes     NuStep   Level 2   Minutes 34   METs 2.1     Track   Laps 4   Minutes 17      History  Smoking Status  . Never Smoker  Smokeless Tobacco  . Never Used    Goals Met:  Exercise tolerated well No report of cardiac concerns or symptoms Strength training completed today  Goals Unmet:  Not Applicable  Comments: Service time is from 1330 to 1500    Dr. Rush Farmer is Medical Director for Pulmonary Rehab at [facility].

## 2016-12-09 ENCOUNTER — Encounter (HOSPITAL_COMMUNITY)
Admission: RE | Admit: 2016-12-09 | Discharge: 2016-12-09 | Disposition: A | Discharge status: Home / Self Care | Payer: Medicare Other | Source: Ambulatory Visit | Attending: Internal Medicine | Admitting: Internal Medicine

## 2016-12-09 VITALS — Wt 238.5 lb

## 2016-12-09 DIAGNOSIS — I272 Pulmonary hypertension, unspecified: Secondary | ICD-10-CM

## 2016-12-09 DIAGNOSIS — I5022 Chronic systolic (congestive) heart failure: Secondary | ICD-10-CM | POA: Diagnosis not present

## 2016-12-09 NOTE — Progress Notes (Signed)
Daily Session Note  Patient Details  Name: Grace Bradford MRN: [id number] Date of Birth: 08-02-1945 Referring Provider:     Pulmonary Rehab Walk Test from 11/23/2016 in [place]  Referring Provider  Dr. Haroldine Laws      Encounter Date: 12/09/2016  Check In:     Session Check In - 12/09/16 1330      Check-In   Location [facility]   Staff Present Su Hilt, MS, ACSM RCEP, Exercise Physiologist;Lisa Ysidro Evert, RN;Maeson Purohit Rollene Rotunda, RN, BSN   Supervising physician immediately available to respond to emergencies [facility] immediately available   Physician(s) Dr. Justus Memory   Medication changes reported     No   Fall or balance concerns reported    No   Tobacco Cessation No Change   Warm-up and Cool-down Performed as group-led instruction   Resistance Training Performed Yes   VAD Patient? No     Pain Assessment   Currently in Pain? No/denies   Multiple Pain Sites No      Capillary Blood Glucose: No results found for this or any previous visit (from the past 24 hour(s)).     POCT Glucose - 12/09/16 1544      POCT Blood Glucose   Pre-Exercise 157 mg/dL   Post-Exercise 136 mg/dL         Exercise Prescription Changes - 12/09/16 1542      Response to Exercise   Blood Pressure (Admit) 110/52   Blood Pressure (Exercise) 130/80   Blood Pressure (Exit) 128/60   Heart Rate (Admit) 59 bpm   Heart Rate (Exercise) 66 bpm   Heart Rate (Exit) 56 bpm   Oxygen Saturation (Admit) 95 %   Oxygen Saturation (Exercise) 92 %   Oxygen Saturation (Exit) 95 %   Rating of Perceived Exertion (Exercise) 17   Perceived Dyspnea (Exercise) 2   Duration Progress to 45 minutes of aerobic exercise without signs/symptoms of physical distress   Intensity Other (comment)  40-80% of HRR     Progression   Progression Continue to progress workloads to maintain intensity without signs/symptoms of physical distress.     Resistance Training   Training Prescription Yes   Weight orange bands   Reps 10-15   Time 10 Minutes     NuStep   Level 2   Minutes 34   METs 1.5     Track   Laps 5   Minutes 17      History  Smoking Status  . Never Smoker  Smokeless Tobacco  . Never Used    Goals Met:  Exercise tolerated well No report of cardiac concerns or symptoms Strength training completed today  Goals Unmet:  Not Applicable  Comments: Service time is from 1330 to 1500   Dr. Rush Farmer is Medical Director for Pulmonary Rehab at [facility].

## 2016-12-12 ENCOUNTER — Other Ambulatory Visit (HOSPITAL_COMMUNITY): Payer: Self-pay | Admitting: Internal Medicine

## 2016-12-14 ENCOUNTER — Encounter (HOSPITAL_COMMUNITY): Payer: Medicare Other

## 2016-12-16 ENCOUNTER — Encounter (HOSPITAL_COMMUNITY): Payer: Medicare Other
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
## 2016-12-21 ENCOUNTER — Encounter (HOSPITAL_COMMUNITY)
Admission: RE | Admit: 2016-12-21 | Discharge: 2016-12-21 | Disposition: A | Discharge status: Home / Self Care | Payer: Medicare Other | Source: Ambulatory Visit | Attending: Internal Medicine | Admitting: Internal Medicine

## 2016-12-21 VITALS — Wt 239.6 lb

## 2016-12-21 DIAGNOSIS — I5022 Chronic systolic (congestive) heart failure: Secondary | ICD-10-CM | POA: Diagnosis not present

## 2016-12-21 DIAGNOSIS — I272 Pulmonary hypertension, unspecified: Secondary | ICD-10-CM

## 2016-12-21 NOTE — Progress Notes (Signed)
Daily Session Note  Patient Details  Name: Grace Bradford MRN: [id number] Date of Birth: February 06, 1946 Referring Provider:     Pulmonary Rehab Walk Test from 11/23/2016 in [place]  Referring Provider  Dr. Haroldine Laws      Encounter Date: 12/21/2016  Check In:     Session Check In - 12/21/16 1330      Check-In   Location [facility]   Staff Present Su Hilt, MS, ACSM RCEP, Exercise Physiologist;Other; Rollene Rotunda, RN, BSN   Supervising physician immediately available to respond to emergencies [facility] immediately available   Physician(s) Dr. Clementeen Graham   Medication changes reported     No   Fall or balance concerns reported    No   Tobacco Cessation No Change   Warm-up and Cool-down Performed as group-led instruction   Resistance Training Performed Yes   VAD Patient? No     Pain Assessment   Currently in Pain? No/denies   Multiple Pain Sites No      Capillary Blood Glucose: No results found for this or any previous visit (from the past 24 hour(s)).      Exercise Prescription Changes - 12/21/16 1549      Response to Exercise   Blood Pressure (Admit) 132/68   Blood Pressure (Exercise) 122/60   Blood Pressure (Exit) 136/80   Heart Rate (Admit) 66 bpm   Heart Rate (Exercise) 70 bpm   Heart Rate (Exit) 60 bpm   Oxygen Saturation (Admit) 95 %   Oxygen Saturation (Exercise) 92 %   Oxygen Saturation (Exit) 95 %   Rating of Perceived Exertion (Exercise) 11   Perceived Dyspnea (Exercise) 1   Duration Progress to 45 minutes of aerobic exercise without signs/symptoms of physical distress   Intensity Other (comment)  40-80% of HRR     Progression   Progression Continue to progress workloads to maintain intensity without signs/symptoms of physical distress.     Resistance Training   Training Prescription Yes   Weight orange bands   Reps 10-15   Time 10 Minutes     NuStep   Level 3   Minutes 34   METs 1.6      Track   Laps 5   Minutes 17      History  Smoking Status  . Never Smoker  Smokeless Tobacco  . Never Used    Goals Met:  Exercise tolerated well No report of cardiac concerns or symptoms Strength training completed today  Goals Unmet:  Not Applicable  Comments: Service time is from 1330 to 1500   Dr. Rush Farmer is Medical Director for Pulmonary Rehab at [facility].

## 2016-12-22 NOTE — Progress Notes (Signed)
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 Factors/Patient Goals at Admission:     Personal Goals and Risk Factors at Admission - 10/15/16 1543      Core Components/Risk Factors/Patient Goals on Admission    Weight Management Obesity;Yes   Intervention Weight Management: Develop a combined nutrition and exercise program designed to reach desired caloric intake, while maintaining appropriate intake of nutrient  and fiber, sodium and fats, and appropriate energy expenditure required for the weight goal.;Obesity: Provide education and appropriate resources to help participant work on and attain dietary goals.;Weight Management/Obesity: Establish reasonable short term and long term weight goals.   Expected Outcomes Short Term:  Continue to assess and modify interventions until short term weight is achieved;Weight Loss: Understanding of general recommendations for a balanced deficit meal plan, which promotes 1-2 lb weight loss per week and includes a negative energy balance of [phone] kcal/d   Improve shortness of breath with ADL's Yes   Intervention Provide education, individualized exercise plan and daily activity instruction to help decrease symptoms of SOB with activities of daily living.   Expected Outcomes Short Term: Achieves a reduction of symptoms when performing activities of daily living.   Develop more efficient breathing techniques such as purse lipped breathing and diaphragmatic breathing; and practicing self-pacing with activity Yes   Intervention Provide education, demonstration and support about specific breathing techniuqes utilized for more efficient breathing. Include techniques such as pursed lipped breathing, diaphragmatic breathing and self-pacing activity.   Expected Outcomes Short Term: Participant will be able to demonstrate and use breathing techniques as needed throughout daily activities.   Diabetes Yes   Intervention Provide education about signs/symptoms and action to take for hypo/hyperglycemia.;Provide education about proper nutrition, including hydration, and aerobic/resistive exercise prescription along with prescribed medications to achieve blood glucose in normal ranges: Fasting glucose 65-99 mg/dL   Expected Outcomes Short Term: Participant verbalizes understanding of the signs/symptoms and immediate care of hyper/hypoglycemia, proper foot care and importance of medication, aerobic/resistive exercise and nutrition plan for blood glucose control.;Long Term: Attainment of HbA1C < 7%.   Heart Failure Yes   Intervention Provide a combined exercise and nutrition program that is supplemented with education, support and counseling about heart failure. Directed toward relieving symptoms such as  shortness of breath, decreased exercise tolerance, and extremity edema.   Expected Outcomes Improve functional capacity of life      Core Components/Risk Factors/Patient Goals Review:      Goals and Risk Factor Review    Row Name 12/22/16 1847             Core Components/Risk Factors/Patient Goals Review   Personal Goals Review Weight Management/Obesity;Develop more efficient breathing techniques such as purse lipped breathing and diaphragmatic breathing and practicing self-pacing with activity.;Improve shortness of breath with ADL's;Diabetes;Heart Failure       Review patient has only attended 3 sessions. unable to evaluate progress.        Expected Outcomes see admission outcomes          Core Components/Risk Factors/Patient Goals at Discharge (Final Review):      Goals and Risk Factor Review - 12/22/16 1847      Core Components/Risk Factors/Patient Goals Review   Personal Goals Review Weight Management/Obesity;Develop more efficient breathing techniques such as purse lipped breathing and diaphragmatic breathing and practicing self-pacing with activity.;Improve shortness of breath with ADL's;Diabetes;Heart Failure   Review patient has only attended 3 sessions. unable to evaluate progress.    Expected Outcomes see admission outcomes      ITP Comments:   Comments: ITP REVIEW It is too early in the program to evaluate progress toward pulmonary rehab goals. Patient has only attended 3 sessions since admission related to a fall in the tub.She just returned for her 3 session this week.Recommend continued exercise, life style modification, education, and utilization of breathing techniques to increase stamina and strength and decrease shortness of  breath with exertion.

## 2016-12-23 ENCOUNTER — Encounter (HOSPITAL_COMMUNITY)
Admission: RE | Admit: 2016-12-23 | Discharge: 2016-12-23 | Disposition: A | Discharge status: Home / Self Care | Payer: Medicare Other | Source: Ambulatory Visit | Attending: Internal Medicine | Admitting: Internal Medicine

## 2016-12-23 DIAGNOSIS — I272 Pulmonary hypertension, unspecified: Secondary | ICD-10-CM

## 2016-12-28 ENCOUNTER — Ambulatory Visit (HOSPITAL_COMMUNITY)
Admission: EM | Admit: 2016-12-28 | Discharge: 2016-12-28 | Disposition: A | Discharge status: Home / Self Care | Payer: Medicare Other | Attending: Family Medicine | Admitting: Family Medicine

## 2016-12-28 ENCOUNTER — Encounter (HOSPITAL_COMMUNITY): Payer: Self-pay | Admitting: Emergency Medicine

## 2016-12-28 ENCOUNTER — Encounter (HOSPITAL_COMMUNITY): Admission: RE | Admit: 2016-12-28 | Payer: Medicare Other | Source: Ambulatory Visit

## 2016-12-28 DIAGNOSIS — M545 Low back pain, unspecified: Secondary | ICD-10-CM

## 2016-12-28 DIAGNOSIS — S39012A Strain of muscle, fascia and tendon of lower back, initial encounter: Secondary | ICD-10-CM

## 2016-12-28 MED ORDER — METHYLPREDNISOLONE SODIUM SUCC 125 MG IJ SOLR
80.0000 mg | Freq: Once | INTRAMUSCULAR | Status: AC
  Administered 2016-12-28: 80 mg via INTRAMUSCULAR

## 2016-12-28 MED ORDER — SILVER SULFADIAZINE 1 % EX CREA
TOPICAL_CREAM | CUTANEOUS | Status: AC
  Filled 2016-12-28: qty 85

## 2016-12-28 MED ORDER — METHYLPREDNISOLONE SODIUM SUCC 125 MG IJ SOLR
INTRAMUSCULAR | Status: AC
  Filled 2016-12-28: qty 2

## 2016-12-28 NOTE — ED Triage Notes (Signed)
PT reports low back pain and right side pain for 2 days. Pain is worse with sitting. No urinary symptoms per PT.
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
## 2016-12-30 ENCOUNTER — Encounter (HOSPITAL_COMMUNITY): Payer: Medicare Other

## 2017-01-04 ENCOUNTER — Telehealth (HOSPITAL_COMMUNITY): Payer: Self-pay | Admitting: Internal Medicine

## 2017-01-04 ENCOUNTER — Encounter (HOSPITAL_COMMUNITY): Payer: Medicare Other

## 2017-01-06 ENCOUNTER — Encounter (HOSPITAL_COMMUNITY)
Admission: RE | Admit: 2017-01-06 | Discharge: 2017-01-06 | Disposition: A | Discharge status: Home / Self Care | Payer: Medicare Other | Source: Ambulatory Visit | Attending: Internal Medicine | Admitting: Internal Medicine

## 2017-01-06 VITALS — Wt 233.0 lb

## 2017-01-06 DIAGNOSIS — I5022 Chronic systolic (congestive) heart failure: Secondary | ICD-10-CM | POA: Diagnosis not present

## 2017-01-06 DIAGNOSIS — I272 Pulmonary hypertension, unspecified: Secondary | ICD-10-CM

## 2017-01-06 NOTE — Progress Notes (Signed)
Daily Session Note  Patient Details  Name: Grace Bradford MRN: [id number] Date of Birth: 1946/04/20 Referring Provider:     Pulmonary Rehab Walk Test from 11/23/2016 in [facility]  Referring Provider  Dr. Haroldine Laws      Encounter Date: 01/06/2017  Check In:     Session Check In - 01/06/17 1526      Check-In   Location [facility]   Staff Present Su Hilt, MS, ACSM RCEP, Exercise Physiologist;Sherlene Rickel Rollene Rotunda, RN, BSN   Supervising physician immediately available to respond to emergencies [facility] immediately available   Physician(s) Dr. Carolin Sicks   Medication changes reported     No   Fall or balance concerns reported    No   Tobacco Cessation No Change   Warm-up and Cool-down Performed as group-led instruction   Resistance Training Performed Yes   VAD Patient? No     Pain Assessment   Currently in Pain? No/denies   Multiple Pain Sites No      Capillary Blood Glucose: No results found for this or any previous visit (from the past 24 hour(s)).     POCT Glucose - 01/06/17 1544      POCT Blood Glucose   Pre-Exercise 175 mg/dL   Post-Exercise 179 mg/dL         Exercise Prescription Changes - 01/06/17 1541      Response to Exercise   Blood Pressure (Admit) 110/70   Blood Pressure (Exercise) 128/60   Blood Pressure (Exit) 100/52   Heart Rate (Admit) 58 bpm   Heart Rate (Exercise) 79 bpm   Heart Rate (Exit) 61 bpm   Oxygen Saturation (Admit) 95 %   Oxygen Saturation (Exercise) 94 %   Oxygen Saturation (Exit) 97 %   Rating of Perceived Exertion (Exercise) 11   Perceived Dyspnea (Exercise) 1   Duration Progress to 45 minutes of aerobic exercise without signs/symptoms of physical distress   Intensity Other (comment)  40-80% of HRR     Progression   Progression Continue to progress workloads to maintain intensity without signs/symptoms of physical distress.     Resistance Training   Training  Prescription Yes   Weight orange bands   Reps 10-15   Time 10 Minutes     NuStep   Level 3   Minutes 34   METs 1.7     Track   Laps 5   Minutes 17      History  Smoking Status  . Never Smoker  Smokeless Tobacco  . Never Used    Goals Met:  Exercise tolerated well Queuing for purse lip breathing No report of cardiac concerns or symptoms Strength training completed today  Goals Unmet:  Not Applicable  Comments: Service time is from 1330 to 1450   Dr. Rush Farmer is Medical Director for Pulmonary Rehab at [facility].

## 2017-01-11 ENCOUNTER — Encounter (HOSPITAL_COMMUNITY): Payer: Medicare Other

## 2017-01-13 ENCOUNTER — Telehealth (HOSPITAL_COMMUNITY): Payer: Self-pay | Admitting: Internal Medicine

## 2017-01-13 ENCOUNTER — Encounter (HOSPITAL_COMMUNITY)
Admission: RE | Admit: 2017-01-13 | Discharge: 2017-01-13 | Disposition: A | Discharge status: Home / Self Care | Payer: Medicare Other | Source: Ambulatory Visit | Attending: Internal Medicine | Admitting: Internal Medicine

## 2017-01-14 NOTE — Progress Notes (Signed)
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 importance of utilizing pulse oximetry during exercise, safety while exercising, and options of places to exercise outside of rehab.     Pulmonary Medications:  -Verbally interactive group education provided by instructor with focus on inhaled medications and proper administration.   Anatomy and Physiology of the Respiratory System and Intimacy:  -Group instruction provided by PowerPoint, verbal discussion, and written material to support subject matter. Instructor reviews respiratory cycle and anatomical components of the respiratory system and their functions. Instructor also reviews differences in obstructive and  restrictive respiratory diseases with examples of each. Intimacy, Sex, and Sexuality differences are  reviewed with a discussion on how relationships can change when diagnosed with pulmonary disease. Common sexual concerns are reviewed.   MD DAY -A group question and answer session with a medical doctor that allows participants to ask questions that relate to their pulmonary disease state.   OTHER EDUCATION -Group or individual verbal, written, or video instructions that support the educational goals of the pulmonary rehab program.   Knowledge Questionnaire Score:     Knowledge Questionnaire Score - 12/09/16 1710      Knowledge Questionnaire Score   Pre Score 8/13      Core Components/Risk Factors/Patient Goals at Admission:     Personal Goals and Risk Factors at Admission - 10/15/16 1543      Core Components/Risk Factors/Patient Goals on Admission    Weight Management Obesity;Yes   Intervention Weight Management: Develop a combined nutrition and exercise program designed to reach desired caloric intake, while maintaining appropriate intake of nutrient and fiber, sodium and fats, and appropriate energy expenditure required for the weight goal.;Obesity: Provide education and appropriate resources to help participant work on and attain dietary goals.;Weight Management/Obesity: Establish reasonable short term and long term weight goals.   Expected Outcomes Short Term: Continue to assess and modify interventions until short term weight is achieved;Weight Loss: Understanding of general recommendations for a balanced deficit meal plan, which promotes 1-2 lb weight loss per week and includes a negative energy balance of [phone] kcal/d   Improve shortness of breath with ADL's Yes   Intervention Provide education, individualized exercise plan and daily activity instruction to help decrease symptoms of SOB with activities of daily living.   Expected Outcomes Short Term: Achieves a reduction of symptoms when performing activities of daily living.   Develop more efficient breathing techniques  such as purse lipped breathing and diaphragmatic breathing; and practicing self-pacing with activity Yes   Intervention Provide education, demonstration and support about specific breathing techniuqes utilized for more efficient breathing. Include techniques such as pursed lipped breathing, diaphragmatic breathing and self-pacing activity.   Expected Outcomes Short Term: Participant will be able to demonstrate and use breathing techniques as needed throughout daily activities.   Diabetes Yes   Intervention Provide education about signs/symptoms and action to take for hypo/hyperglycemia.;Provide education about proper nutrition, including hydration, and aerobic/resistive exercise prescription along with prescribed medications to achieve blood glucose in normal ranges: Fasting glucose 65-99 mg/dL   Expected Outcomes Short Term: Participant verbalizes understanding of the signs/symptoms and immediate care of hyper/hypoglycemia, proper foot care and importance of medication, aerobic/resistive exercise and nutrition plan for blood glucose control.;Long Term: Attainment of HbA1C < 7%.   Heart Failure Yes   Intervention Provide a combined exercise and nutrition program that is supplemented with education, support and counseling about heart failure. Directed toward relieving symptoms such as shortness of breath, decreased exercise tolerance, and extremity edema.   Expected Outcomes Improve functional capacity of life      Core Components/Risk Factors/Patient Goals Review:      Goals and Risk Factor Review    Row Name 12/22/16 1847 01/14/17 1332 01/14/17 1333         Core Components/Risk Factors/Patient Goals Review   Personal Goals Review Weight Management/Obesity;Develop more efficient breathing techniques such as purse lipped breathing and diaphragmatic breathing and practicing self-pacing with activity.;Improve shortness of breath with ADL's;Diabetes;Heart Failure Weight Management/Obesity;Develop  more efficient breathing techniques such as purse lipped breathing and diaphragmatic  breathing and practicing self-pacing with activity.;Improve shortness of breath with ADL's;Diabetes;Heart Failure  -     Review patient has only attended 3 sessions. unable to evaluate progress.   - patient has not made any progress towards pulmonary rehab goals. she has only attended 4 sessions sinces admission and none have been consecutive sessions. Will discuss with patient importance of consistant attendance in order to see progress towards goals.     Expected Outcomes see admission outcomes  - see admission outcomes        Core Components/Risk Factors/Patient Goals at Discharge (Final Review):      Goals and Risk Factor Review - 01/14/17 1333      Core Components/Risk Factors/Patient Goals Review   Review patient has not made any progress towards pulmonary rehab goals. she has only attended 4 sessions sinces admission and none have been consecutive sessions. Will discuss with patient importance of consistant attendance in order to see progress towards goals.   Expected Outcomes see admission outcomes      ITP Comments:   Comments: ITP REVIEW Pt is not making expected progress toward pulmonary rehab goals after completing 4 sessions. Recommend continued exercise, life style modification, education, and utilization of breathing techniques to increase stamina and strength and decrease shortness of breath with exertion.

## 2017-01-18 ENCOUNTER — Encounter (HOSPITAL_COMMUNITY)
Admission: RE | Admit: 2017-01-18 | Discharge: 2017-01-18 | Disposition: A | Discharge status: Home / Self Care | Payer: Medicare Other | Source: Ambulatory Visit | Attending: Internal Medicine | Admitting: Internal Medicine

## 2017-01-18 VITALS — Wt 232.6 lb

## 2017-01-18 DIAGNOSIS — I272 Pulmonary hypertension, unspecified: Secondary | ICD-10-CM

## 2017-01-18 DIAGNOSIS — G8929 Other chronic pain: Secondary | ICD-10-CM | POA: Diagnosis not present

## 2017-01-18 DIAGNOSIS — M545 Low back pain: Secondary | ICD-10-CM | POA: Diagnosis not present

## 2017-01-18 DIAGNOSIS — I10 Essential (primary) hypertension: Secondary | ICD-10-CM | POA: Diagnosis not present

## 2017-01-18 DIAGNOSIS — Z23 Encounter for immunization: Secondary | ICD-10-CM | POA: Diagnosis not present

## 2017-01-18 DIAGNOSIS — K219 Gastro-esophageal reflux disease without esophagitis: Secondary | ICD-10-CM | POA: Diagnosis not present

## 2017-01-18 DIAGNOSIS — I5022 Chronic systolic (congestive) heart failure: Secondary | ICD-10-CM | POA: Diagnosis not present

## 2017-01-18 DIAGNOSIS — G4733 Obstructive sleep apnea (adult) (pediatric): Secondary | ICD-10-CM | POA: Diagnosis not present

## 2017-01-18 DIAGNOSIS — E7800 Pure hypercholesterolemia, unspecified: Secondary | ICD-10-CM | POA: Diagnosis not present

## 2017-01-18 DIAGNOSIS — E039 Hypothyroidism, unspecified: Secondary | ICD-10-CM | POA: Diagnosis not present

## 2017-01-18 DIAGNOSIS — N184 Chronic kidney disease, stage 4 (severe): Secondary | ICD-10-CM | POA: Diagnosis not present

## 2017-01-18 DIAGNOSIS — E118 Type 2 diabetes mellitus with unspecified complications: Secondary | ICD-10-CM | POA: Diagnosis not present

## 2017-01-18 DIAGNOSIS — I509 Heart failure, unspecified: Secondary | ICD-10-CM | POA: Diagnosis not present

## 2017-01-18 NOTE — Progress Notes (Signed)
Daily Session Note  Patient Details  Name: Grace Bradford MRN: [id number] Date of Birth: Aug 30, 1945 Referring Provider:     Pulmonary Rehab Walk Test from 11/23/2016 in [place]  Referring Provider  Dr. Haroldine Laws      Encounter Date: 01/18/2017  Check In:     Session Check In - 01/18/17 1330      Check-In   Location [facility]   Staff Present Rodney Langton, RN;Tattianna Schnarr Rollene Rotunda, RN, BSN;Molly diVincenzo, MS, ACSM RCEP, Exercise Physiologist   Supervising physician immediately available to respond to emergencies [facility] immediately available   Physician(s) Dr. Wynelle Cleveland   Medication changes reported     No   Fall or balance concerns reported    No   Tobacco Cessation No Change   Warm-up and Cool-down Performed as group-led instruction   Resistance Training Performed Yes   VAD Patient? No     Pain Assessment   Currently in Pain? No/denies   Multiple Pain Sites No      Capillary Blood Glucose: No results found for this or any previous visit (from the past 24 hour(s)).     POCT Glucose - 01/18/17 1521      POCT Blood Glucose   Pre-Exercise 177 mg/dL   Post-Exercise 73 mg/dL  1/2 cup gingerale   Post-Exercise #3 112 mg/dL         Exercise Prescription Changes - 01/18/17 1519      Response to Exercise   Blood Pressure (Admit) 100/52   Blood Pressure (Exercise) 110/62   Blood Pressure (Exit) 114/74   Heart Rate (Admit) 61 bpm   Heart Rate (Exercise) 75 bpm   Heart Rate (Exit) 63 bpm   Oxygen Saturation (Admit) 97 %   Oxygen Saturation (Exercise) 95 %   Oxygen Saturation (Exit) 98 %   Rating of Perceived Exertion (Exercise) 15   Perceived Dyspnea (Exercise) 1   Duration Progress to 45 minutes of aerobic exercise without signs/symptoms of physical distress   Intensity Other (comment)  40-80% of HRR     Progression   Progression Continue to progress workloads to maintain intensity without signs/symptoms of  physical distress.     Resistance Training   Training Prescription Yes   Weight orange bands   Reps 10-15   Time 10 Minutes     NuStep   Level 3   Minutes 34   METs 1.9     Track   Laps 6   Minutes 17      History  Smoking Status  . Never Smoker  Smokeless Tobacco  . Never Used    Goals Met:  Exercise tolerated well Queuing for purse lip breathing No report of cardiac concerns or symptoms Strength training completed today  Goals Unmet:  Not Applicable  Comments: Service time is from 1330 to 1500   Dr. Rush Farmer is Medical Director for Pulmonary Rehab at [facility].

## 2017-01-19 DIAGNOSIS — E7800 Pure hypercholesterolemia, unspecified: Secondary | ICD-10-CM | POA: Diagnosis not present

## 2017-01-19 DIAGNOSIS — E118 Type 2 diabetes mellitus with unspecified complications: Secondary | ICD-10-CM | POA: Diagnosis not present

## 2017-01-19 DIAGNOSIS — E039 Hypothyroidism, unspecified: Secondary | ICD-10-CM | POA: Diagnosis not present

## 2017-01-20 ENCOUNTER — Encounter (HOSPITAL_COMMUNITY)
Admission: RE | Admit: 2017-01-20 | Discharge: 2017-01-20 | Disposition: A | Discharge status: Home / Self Care | Payer: Medicare Other | Source: Ambulatory Visit | Attending: Internal Medicine | Admitting: Internal Medicine

## 2017-01-20 VITALS — Wt 235.5 lb

## 2017-01-20 DIAGNOSIS — I5022 Chronic systolic (congestive) heart failure: Secondary | ICD-10-CM | POA: Diagnosis not present

## 2017-01-20 DIAGNOSIS — I272 Pulmonary hypertension, unspecified: Secondary | ICD-10-CM

## 2017-01-20 NOTE — Progress Notes (Signed)
Daily Session Note  Patient Details  Name: Grace Bradford MRN: [id number] Date of Birth: 06/12/1945 Referring Provider:     Pulmonary Rehab Walk Test from 11/23/2016 in [place]  Referring Provider  Dr. Haroldine Laws      Encounter Date: 01/20/2017  Check In:     Session Check In - 01/20/17 1338      Check-In   [facility]   Staff Present Su Hilt, MS, ACSM RCEP, Exercise Physiologist;Lisa Ysidro Evert, RN;Eragon Hammond Rollene Rotunda, RN, BSN   Supervising physician immediately available to respond to emergencies [facility] immediately available   Physician(s) Dr. Wendee Beavers   Medication changes reported     No   Fall or balance concerns reported    No   Tobacco Cessation No Change   Warm-up and Cool-down Performed as group-led instruction   Resistance Training Performed Yes   VAD Patient? No     Pain Assessment   Currently in Pain? No/denies   Multiple Pain Sites No      Capillary Blood Glucose: No results found for this or any previous visit (from the past 24 hour(s)).     POCT Glucose - 01/20/17 1539      POCT Blood Glucose   Pre-Exercise 171 mg/dL   Post-Exercise 129 mg/dL         Exercise Prescription Changes - 01/20/17 1536      Response to Exercise   Blood Pressure (Admit) 102/52   Blood Pressure (Exercise) 110/64   Blood Pressure (Exit) 122/80   Heart Rate (Admit) 53 bpm   Heart Rate (Exercise) 73 bpm   Heart Rate (Exit) 53 bpm   Oxygen Saturation (Admit) 95 %   Oxygen Saturation (Exercise) 95 %   Oxygen Saturation (Exit) 98 %   Rating of Perceived Exertion (Exercise) 11   Perceived Dyspnea (Exercise) 2   Duration Progress to 45 minutes of aerobic exercise without signs/symptoms of physical distress   Intensity THRR unchanged     Progression   Progression Continue to progress workloads to maintain intensity without signs/symptoms of physical distress.     Resistance Training   Training Prescription  Yes   Weight orange bands   Reps 10-15   Time 10 Minutes     NuStep   Level 5   Minutes 34   METs 2.2      History  Smoking Status  . Never Smoker  Smokeless Tobacco  . Never Used    Goals Met:  Exercise tolerated well Queuing for purse lip breathing No report of cardiac concerns or symptoms Strength training completed today  Goals Unmet:  Not Applicable  Comments: Service time is from 1330 to 1505   Dr. Rush Farmer is Medical Director for Pulmonary Rehab at [facility].

## 2017-01-25 ENCOUNTER — Encounter (HOSPITAL_COMMUNITY)
Admission: RE | Admit: 2017-01-25 | Discharge: 2017-01-25 | Disposition: A | Discharge status: Home / Self Care | Payer: Medicare Other | Source: Ambulatory Visit | Attending: Internal Medicine | Admitting: Internal Medicine

## 2017-01-25 VITALS — Wt 230.4 lb

## 2017-01-25 DIAGNOSIS — I272 Pulmonary hypertension, unspecified: Secondary | ICD-10-CM

## 2017-01-25 DIAGNOSIS — I5022 Chronic systolic (congestive) heart failure: Secondary | ICD-10-CM | POA: Insufficient documentation

## 2017-01-25 LAB — GLUCOSE, CAPILLARY: GLUCOSE-CAPILLARY: 109 mg/dL — AB (ref 65–99)

## 2017-01-25 NOTE — Progress Notes (Signed)
Daily Session Note  Patient Details  Name: Grace Bradford MRN: [id number] Date of Birth: 12-Jun-1945 Referring Provider:     Pulmonary Rehab Walk Test from 11/23/2016 in [facility]  Referring Provider  Dr. Gala Romney      Encounter Date: 01/25/2017  Check In:     Session Check In - 01/25/17 1532      Check-In   [facility]   Staff Present Natividad Brood, MS, ACSM RCEP, Exercise Physiologist;Ethelda Deangelo Kizzie Bane, RN   Supervising physician immediately available to respond to emergencies [facility] immediately available   Physician(s) Dr. Cena Benton   Medication changes reported     No   Fall or balance concerns reported    No   Tobacco Cessation No Change   Warm-up and Cool-down Performed as group-led instruction   Resistance Training Performed Yes   VAD Patient? No     Pain Assessment   Currently in Pain? No/denies   Multiple Pain Sites No      Capillary Blood Glucose: Results for orders placed or performed during the hospital encounter of 01/25/17 (from the past 24 hour(s))  Glucose, capillary     Status: Abnormal   Collection Time: 01/25/17  2:48 PM  Result Value Ref Range   Glucose-Capillary 109 (H) 65 - 99 mg/dL       POCT Glucose - 29/52/84 1540      POCT Blood Glucose   Pre-Exercise 123 mg/dL   Post-Exercise 58 mg/dL   Post-Exercise #2 132 mg/dL         Exercise Prescription Changes - 01/25/17 1500      Response to Exercise   Blood Pressure (Admit) 130/72   Blood Pressure (Exercise) 104/80   Blood Pressure (Exit) 100/70   Heart Rate (Admit) 54 bpm   Heart Rate (Exercise) 58 bpm   Heart Rate (Exit) 50 bpm   Oxygen Saturation (Admit) 94 %   Oxygen Saturation (Exercise) 95 %   Oxygen Saturation (Exit) 98 %   Rating of Perceived Exertion (Exercise) 11   Perceived Dyspnea (Exercise) 1   Duration Progress to 45 minutes of aerobic exercise without signs/symptoms of physical distress   Intensity THRR  unchanged     Progression   Progression Continue to progress workloads to maintain intensity without signs/symptoms of physical distress.     Resistance Training   Training Prescription Yes   Weight orange bands   Reps 10-15   Time 10 Minutes     NuStep   Level 5   Minutes 34   METs 2      History  Smoking Status  . Never Smoker  Smokeless Tobacco  . Never Used    Goals Met:  Strength training completed today  Goals Unmet:  Not Applicable  Comments: Service time is from 1330 to 1520. At the end of the second exercise station [name] complained of sweating and not feeling right. She checked her blood sugar it was 58. She was given one glucose gel tube and ginger-ale to drink. Her blood sugar in 15 minutes was 109. [name] skin was warm and dry and she stated she felt better. We questioned her and she states that she has blood sugars are  less than 70 4-5 days per week. I contacted Dr. Elmyra Ricks office and reported this information to his nurse. The nurse was relaying the blood sugars to the PA, since this was the last contact in the office. The PA is to contact her  about her low blood sugars.    Dr. Alyson Reedy is Medical Director for Pulmonary Rehab at [facility].

## 2017-01-25 NOTE — Progress Notes (Signed)
Nutrition Note Spoke with pt. Pt hypoglycemic with CBG 58 mg/dL. Pt states she took her Levemir this morning and ate a cinnamon raisin English muffin with butter and drank juice at 8 am. Pt did not eat anything after 8 am. Pt reportedly eats 1 "good meal" a day, which her daughter brings her. Suggestions for pre-exercise meal/snacks discussed. Pt expressed understanding of the information reviewed. [name] Hence, RN contacted pt's MD re: frequent hypoglycemic episodes (3-4 per week).  Continue client-centered nutrition education by RD as part of interdisciplinary care.  Monitor and evaluate progress toward nutrition goal with team.  Derek Mound, M.Ed, RD, LDN, CDE 01/25/2017 3:34 PM

## 2017-01-27 ENCOUNTER — Encounter (HOSPITAL_COMMUNITY)
Admission: RE | Admit: 2017-01-27 | Discharge: 2017-01-27 | Disposition: A | Discharge status: Home / Self Care | Payer: Medicare Other | Source: Ambulatory Visit | Attending: Internal Medicine | Admitting: Internal Medicine

## 2017-01-27 VITALS — Wt 233.2 lb

## 2017-01-27 DIAGNOSIS — I272 Pulmonary hypertension, unspecified: Secondary | ICD-10-CM

## 2017-01-27 DIAGNOSIS — I5022 Chronic systolic (congestive) heart failure: Secondary | ICD-10-CM | POA: Diagnosis not present

## 2017-01-27 NOTE — Progress Notes (Signed)
Daily Session Note  Patient Details  Name: Grace Bradford MRN: [id number] Date of Birth: Nov 20, 1945 Referring Provider:     Pulmonary Rehab Walk Test from 11/23/2016 in [place]  Referring Provider  Dr. Haroldine Laws      Encounter Date: 01/27/2017  Check In:     Session Check In - 01/27/17 1339      Check-In   [facility]   Staff Present Su Hilt, MS, ACSM RCEP, Exercise Physiologist;Lisa Ysidro Evert, RN   Supervising physician immediately available to respond to emergencies [facility] immediately available   Physician(s) Dr. Zigmund Daniel   Medication changes reported     No   Fall or balance concerns reported    No   Tobacco Cessation No Change   Warm-up and Cool-down Performed as group-led instruction   Resistance Training Performed Yes   VAD Patient? No     Pain Assessment   Currently in Pain? No/denies   Multiple Pain Sites No      Capillary Blood Glucose: No results found for this or any previous visit (from the past 24 hour(s)).      Exercise Prescription Changes - 01/27/17 1500      Response to Exercise   Blood Pressure (Admit) 106/66   Blood Pressure (Exercise) 138/84   Blood Pressure (Exit) 112/60   Heart Rate (Admit) 56 bpm   Heart Rate (Exercise) 71 bpm   Heart Rate (Exit) 52 bpm   Oxygen Saturation (Admit) 95 %   Oxygen Saturation (Exercise) 94 %   Oxygen Saturation (Exit) 95 %   Rating of Perceived Exertion (Exercise) 13   Perceived Dyspnea (Exercise) 1   Duration Progress to 45 minutes of aerobic exercise without signs/symptoms of physical distress   Intensity THRR unchanged     Progression   Progression Continue to progress workloads to maintain intensity without signs/symptoms of physical distress.     Resistance Training   Training Prescription Yes   Weight orange bands   Reps 10-15   Time 10 Minutes     NuStep   Level 5   Minutes 17   METs 1.9     Track   Laps 10   Minutes 17      History  Smoking Status  . Never Smoker  Smokeless Tobacco  . Never Used    Goals Met:  Exercise tolerated well No report of cardiac concerns or symptoms Strength training completed today  Goals Unmet:  Not Applicable  Comments: Service time is from 1:30p to 3:15p    Dr. Rush Farmer is Medical Director for Pulmonary Rehab at [facility].

## 2017-01-31 ENCOUNTER — Emergency Department (HOSPITAL_COMMUNITY): Payer: Medicare Other

## 2017-01-31 ENCOUNTER — Inpatient Hospital Stay (HOSPITAL_COMMUNITY)
Admission: EM | Admit: 2017-01-31 | Discharge: 2017-02-02 | DRG: 871 | Disposition: A | Discharge status: Home / Self Care | Payer: Medicare Other | Attending: Internal Medicine | Admitting: Internal Medicine

## 2017-01-31 ENCOUNTER — Encounter (HOSPITAL_COMMUNITY): Payer: Self-pay | Admitting: Emergency Medicine

## 2017-01-31 DIAGNOSIS — K5790 Diverticulosis of intestine, part unspecified, without perforation or abscess without bleeding: Secondary | ICD-10-CM | POA: Diagnosis present

## 2017-01-31 DIAGNOSIS — E039 Hypothyroidism, unspecified: Secondary | ICD-10-CM | POA: Diagnosis present

## 2017-01-31 DIAGNOSIS — J398 Other specified diseases of upper respiratory tract: Secondary | ICD-10-CM | POA: Diagnosis present

## 2017-01-31 DIAGNOSIS — I5042 Chronic combined systolic (congestive) and diastolic (congestive) heart failure: Secondary | ICD-10-CM | POA: Diagnosis present

## 2017-01-31 DIAGNOSIS — I252 Old myocardial infarction: Secondary | ICD-10-CM

## 2017-01-31 DIAGNOSIS — J42 Unspecified chronic bronchitis: Secondary | ICD-10-CM | POA: Diagnosis present

## 2017-01-31 DIAGNOSIS — I428 Other cardiomyopathies: Secondary | ICD-10-CM | POA: Diagnosis present

## 2017-01-31 DIAGNOSIS — G4733 Obstructive sleep apnea (adult) (pediatric): Secondary | ICD-10-CM | POA: Diagnosis present

## 2017-01-31 DIAGNOSIS — J984 Other disorders of lung: Secondary | ICD-10-CM

## 2017-01-31 DIAGNOSIS — N184 Chronic kidney disease, stage 4 (severe): Secondary | ICD-10-CM | POA: Diagnosis not present

## 2017-01-31 DIAGNOSIS — I130 Hypertensive heart and chronic kidney disease with heart failure and stage 1 through stage 4 chronic kidney disease, or unspecified chronic kidney disease: Secondary | ICD-10-CM | POA: Diagnosis not present

## 2017-01-31 DIAGNOSIS — R0602 Shortness of breath: Secondary | ICD-10-CM | POA: Diagnosis not present

## 2017-01-31 DIAGNOSIS — E1140 Type 2 diabetes mellitus with diabetic neuropathy, unspecified: Secondary | ICD-10-CM | POA: Diagnosis present

## 2017-01-31 DIAGNOSIS — E669 Obesity, unspecified: Secondary | ICD-10-CM | POA: Diagnosis present

## 2017-01-31 DIAGNOSIS — I5022 Chronic systolic (congestive) heart failure: Secondary | ICD-10-CM | POA: Diagnosis present

## 2017-01-31 DIAGNOSIS — I5032 Chronic diastolic (congestive) heart failure: Secondary | ICD-10-CM | POA: Diagnosis present

## 2017-01-31 DIAGNOSIS — Z6841 Body Mass Index (BMI) 40.0 and over, adult: Secondary | ICD-10-CM

## 2017-01-31 DIAGNOSIS — R404 Transient alteration of awareness: Secondary | ICD-10-CM | POA: Diagnosis not present

## 2017-01-31 DIAGNOSIS — J189 Pneumonia, unspecified organism: Secondary | ICD-10-CM | POA: Diagnosis not present

## 2017-01-31 DIAGNOSIS — Z8249 Family history of ischemic heart disease and other diseases of the circulatory system: Secondary | ICD-10-CM

## 2017-01-31 DIAGNOSIS — R05 Cough: Secondary | ICD-10-CM | POA: Diagnosis not present

## 2017-01-31 DIAGNOSIS — Z8673 Personal history of transient ischemic attack (TIA), and cerebral infarction without residual deficits: Secondary | ICD-10-CM | POA: Diagnosis not present

## 2017-01-31 DIAGNOSIS — Z833 Family history of diabetes mellitus: Secondary | ICD-10-CM

## 2017-01-31 DIAGNOSIS — J181 Lobar pneumonia, unspecified organism: Secondary | ICD-10-CM | POA: Diagnosis not present

## 2017-01-31 DIAGNOSIS — R531 Weakness: Secondary | ICD-10-CM | POA: Diagnosis not present

## 2017-01-31 DIAGNOSIS — E1122 Type 2 diabetes mellitus with diabetic chronic kidney disease: Secondary | ICD-10-CM | POA: Diagnosis present

## 2017-01-31 DIAGNOSIS — J95859 Other complication of respirator [ventilator]: Secondary | ICD-10-CM | POA: Diagnosis not present

## 2017-01-31 DIAGNOSIS — I2510 Atherosclerotic heart disease of native coronary artery without angina pectoris: Secondary | ICD-10-CM | POA: Diagnosis present

## 2017-01-31 DIAGNOSIS — A419 Sepsis, unspecified organism: Secondary | ICD-10-CM | POA: Diagnosis not present

## 2017-01-31 DIAGNOSIS — N183 Chronic kidney disease, stage 3 (moderate): Secondary | ICD-10-CM | POA: Diagnosis not present

## 2017-01-31 DIAGNOSIS — I509 Heart failure, unspecified: Secondary | ICD-10-CM

## 2017-01-31 DIAGNOSIS — K219 Gastro-esophageal reflux disease without esophagitis: Secondary | ICD-10-CM | POA: Diagnosis present

## 2017-01-31 DIAGNOSIS — E119 Type 2 diabetes mellitus without complications: Secondary | ICD-10-CM

## 2017-01-31 DIAGNOSIS — I110 Hypertensive heart disease with heart failure: Secondary | ICD-10-CM | POA: Diagnosis not present

## 2017-01-31 DIAGNOSIS — I429 Cardiomyopathy, unspecified: Secondary | ICD-10-CM | POA: Diagnosis present

## 2017-01-31 DIAGNOSIS — Z90710 Acquired absence of both cervix and uterus: Secondary | ICD-10-CM

## 2017-01-31 DIAGNOSIS — I272 Pulmonary hypertension, unspecified: Secondary | ICD-10-CM | POA: Diagnosis present

## 2017-01-31 LAB — COMPREHENSIVE METABOLIC PANEL
ALBUMIN: 3.4 g/dL — AB (ref 3.5–5.0)
ALK PHOS: 68 U/L (ref 38–126)
ALT: 11 U/L — AB (ref 14–54)
AST: 17 U/L (ref 15–41)
Anion gap: 13 (ref 5–15)
BUN: 43 mg/dL — AB (ref 6–20)
CHLORIDE: 99 mmol/L — AB (ref 101–111)
CO2: 27 mmol/L (ref 22–32)
CREATININE: 2.28 mg/dL — AB (ref 0.44–1.00)
Calcium: 8.2 mg/dL — ABNORMAL LOW (ref 8.9–10.3)
GFR calc Af Amer: 24 mL/min — ABNORMAL LOW (ref >60)
GFR calc non Af Amer: 21 mL/min — ABNORMAL LOW (ref >60)
GLUCOSE: 177 mg/dL — AB (ref 65–99)
Potassium: 4.3 mmol/L (ref 3.5–5.1)
SODIUM: 139 mmol/L (ref 135–145)
Total Bilirubin: 0.7 mg/dL (ref 0.3–1.2)
Total Protein: 7.5 g/dL (ref 6.5–8.1)

## 2017-01-31 LAB — I-STAT CG4 LACTIC ACID, ED: Lactic Acid, Venous: 1.79 mmol/L (ref 0.5–1.9)

## 2017-01-31 LAB — CBC WITH DIFFERENTIAL/PLATELET
Basophils Absolute: 0 10*3/uL (ref 0.0–0.1)
Basophils Relative: 0 %
EOS ABS: 0.1 10*3/uL (ref 0.0–0.7)
EOS PCT: 1 %
HCT: 37.8 % (ref 36.0–46.0)
HEMOGLOBIN: 12.1 g/dL (ref 12.0–15.0)
LYMPHS ABS: 1.6 10*3/uL (ref 0.7–4.0)
Lymphocytes Relative: 14 %
MCH: 25.7 pg — AB (ref 26.0–34.0)
MCHC: 32 g/dL (ref 30.0–36.0)
MCV: 80.3 fL (ref 78.0–100.0)
MONO ABS: 1.3 10*3/uL — AB (ref 0.1–1.0)
MONOS PCT: 12 %
NEUTROS PCT: 74 %
Neutro Abs: 8.5 10*3/uL — ABNORMAL HIGH (ref 1.7–7.7)
Platelets: 131 10*3/uL — ABNORMAL LOW (ref 150–400)
RBC: 4.71 MIL/uL (ref 3.87–5.11)
RDW: 17.1 % — AB (ref 11.5–15.5)
WBC: 11.5 10*3/uL — ABNORMAL HIGH (ref 4.0–10.5)

## 2017-01-31 LAB — INFLUENZA PANEL BY PCR (TYPE A & B)
INFLBPCR: NEGATIVE
Influenza A By PCR: NEGATIVE

## 2017-01-31 LAB — BRAIN NATRIURETIC PEPTIDE: B Natriuretic Peptide: 2338.4 pg/mL — ABNORMAL HIGH (ref 0.0–100.0)

## 2017-01-31 LAB — I-STAT TROPONIN, ED: TROPONIN I, POC: 0.06 ng/mL (ref 0.00–0.08)

## 2017-01-31 MED ORDER — VANCOMYCIN HCL 10 G IV SOLR
2000.0000 mg | Freq: Once | INTRAVENOUS | Status: DC
  Filled 2017-01-31: qty 2000

## 2017-01-31 MED ORDER — METHYLPREDNISOLONE SODIUM SUCC 125 MG IJ SOLR
125.0000 mg | Freq: Once | INTRAMUSCULAR | Status: AC
  Administered 2017-01-31: 125 mg via INTRAVENOUS
  Filled 2017-01-31: qty 2

## 2017-01-31 MED ORDER — VANCOMYCIN HCL 10 G IV SOLR: 1500.0000 mg | INTRAVENOUS | Status: DC

## 2017-01-31 MED ORDER — FUROSEMIDE 10 MG/ML IJ SOLN
60.0000 mg | Freq: Once | INTRAMUSCULAR | Status: AC
  Administered 2017-01-31: 60 mg via INTRAVENOUS
  Filled 2017-01-31: qty 6

## 2017-01-31 MED ORDER — PREDNISONE 20 MG PO TABS
20.0000 mg | ORAL_TABLET | Freq: Two times a day (BID) | ORAL | Status: DC
  Administered 2017-02-01 – 2017-02-02 (×3): 20 mg via ORAL
  Filled 2017-01-31 (×3): qty 1

## 2017-01-31 MED ORDER — IPRATROPIUM-ALBUTEROL 0.5-2.5 (3) MG/3ML IN SOLN
3.0000 mL | Freq: Once | RESPIRATORY_TRACT | Status: AC
  Administered 2017-01-31: 3 mL via RESPIRATORY_TRACT
  Filled 2017-01-31: qty 3

## 2017-01-31 MED ORDER — SODIUM CHLORIDE 0.9 % IV SOLN: 250.0000 mL | INTRAVENOUS | Status: DC | PRN

## 2017-01-31 MED ORDER — ASPIRIN EC 81 MG PO TBEC
81.0000 mg | DELAYED_RELEASE_TABLET | Freq: Every day | ORAL | Status: DC
  Administered 2017-02-01 – 2017-02-02 (×2): 81 mg via ORAL
  Filled 2017-01-31 (×2): qty 1

## 2017-01-31 MED ORDER — CLOPIDOGREL BISULFATE 75 MG PO TABS
75.0000 mg | ORAL_TABLET | Freq: Every day | ORAL | Status: DC
  Administered 2017-02-01 – 2017-02-02 (×2): 75 mg via ORAL
  Filled 2017-01-31 (×2): qty 1

## 2017-01-31 MED ORDER — LEVOFLOXACIN IN D5W 500 MG/100ML IV SOLN
500.0000 mg | INTRAVENOUS | Status: DC
  Filled 2017-01-31: qty 100

## 2017-01-31 MED ORDER — IPRATROPIUM-ALBUTEROL 0.5-2.5 (3) MG/3ML IN SOLN
3.0000 mL | Freq: Four times a day (QID) | RESPIRATORY_TRACT | Status: DC
  Administered 2017-02-01 (×2): 3 mL via RESPIRATORY_TRACT
  Filled 2017-01-31 (×2): qty 3

## 2017-01-31 MED ORDER — ACETAMINOPHEN 500 MG PO TABS
1000.0000 mg | ORAL_TABLET | Freq: Once | ORAL | Status: AC
  Administered 2017-01-31: 1000 mg via ORAL
  Filled 2017-01-31: qty 2

## 2017-01-31 MED ORDER — SODIUM CHLORIDE 0.9% FLUSH: 3.0000 mL | INTRAVENOUS | Status: DC | PRN

## 2017-01-31 MED ORDER — SODIUM CHLORIDE 0.9% FLUSH
3.0000 mL | Freq: Two times a day (BID) | INTRAVENOUS | Status: DC
  Administered 2017-02-01 – 2017-02-02 (×3): 3 mL via INTRAVENOUS

## 2017-01-31 MED ORDER — LEVOFLOXACIN IN D5W 750 MG/150ML IV SOLN
750.0000 mg | Freq: Once | INTRAVENOUS | Status: AC
  Administered 2017-01-31: 750 mg via INTRAVENOUS
  Filled 2017-01-31: qty 150

## 2017-01-31 MED ORDER — CARVEDILOL 6.25 MG PO TABS
9.3750 mg | ORAL_TABLET | Freq: Two times a day (BID) | ORAL | Status: DC
  Administered 2017-02-01 – 2017-02-02 (×3): 9.375 mg via ORAL
  Filled 2017-01-31 (×3): qty 1

## 2017-01-31 MED ORDER — VANCOMYCIN HCL IN DEXTROSE 1-5 GM/200ML-% IV SOLN: 1000.0000 mg | Freq: Once | INTRAVENOUS | Status: DC

## 2017-01-31 NOTE — ED Notes (Signed)
Admitting MD at bedside.

## 2017-01-31 NOTE — ED Triage Notes (Signed)
Per EMS: Pt c/o generalized weakness for 2 days w/. Progressive SOB. Denies syncope. Flu shot 4 days ago. Rhonchi noted. 98% on RA. Pt reports fever yesterday. No pedal edema noted.  HR: 68 BP: 148/70 CBG 155 Resp 20

## 2017-01-31 NOTE — Progress Notes (Signed)
Received report from ED RN, [place].

## 2017-01-31 NOTE — ED Notes (Signed)
Patient transported to X-ray 

## 2017-01-31 NOTE — Progress Notes (Signed)
Pharmacy Antibiotic Note  Grace Bradford is a 71 y.o. female admitted on 01/31/2017 with pneumonia.  Pharmacy has been consulted for Levaquin and vancomycin dosing. WBC elevated at 11.5. SCr 2.28 which seems to be baseline. nCrCl ~ 25 mL/min. Patient has a dose of Levaquin 750 mg IV ordered x 1 dose.   Plan: -Levaquin 500 mg IV Q 48 hours starting on 10/10 -Vancomycin 2 gm IV once, then Vancomycin 1500 mg IV Q 48 hours -Monitor CBC, renal fx, cultures and clinical progress -VT at SS   Height: 5\' 2"  (157.5 cm) Weight: 231 lb (104.8 kg) IBW/kg (Calculated) : 50.1  Temp (24hrs), Avg:100.5 F (38.1 C), Min:100.5 F (38.1 C), Max:100.5 F (38.1 C)   Recent Labs Lab 01/31/17 2021 01/31/17 2041  WBC 11.5*  --   CREATININE 2.28*  --   LATICACIDVEN  --  1.79    Estimated Creatinine Clearance: 26.1 mL/min (A) (by C-G formula based on SCr of 2.28 mg/dL (H)).    Allergies  Allergen Reactions  . Rocephin [Ceftriaxone Sodium In Dextrose] Itching    Antimicrobials this admission: Vanc 10/8 >>  Levaquin 10/8 >>   Dose adjustments this admission: None   Microbiology results:  Thank you for allowing pharmacy to be a part of this patient's care.  Albertina Parr, PharmD., BCPS Clinical Pharmacist Pager [phone]
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
## 2017-02-01 ENCOUNTER — Encounter (HOSPITAL_COMMUNITY): Payer: Medicare Other

## 2017-02-01 DIAGNOSIS — I130 Hypertensive heart and chronic kidney disease with heart failure and stage 1 through stage 4 chronic kidney disease, or unspecified chronic kidney disease: Secondary | ICD-10-CM | POA: Diagnosis present

## 2017-02-01 DIAGNOSIS — E1122 Type 2 diabetes mellitus with diabetic chronic kidney disease: Secondary | ICD-10-CM | POA: Diagnosis present

## 2017-02-01 DIAGNOSIS — J42 Unspecified chronic bronchitis: Secondary | ICD-10-CM | POA: Diagnosis present

## 2017-02-01 DIAGNOSIS — E118 Type 2 diabetes mellitus with unspecified complications: Secondary | ICD-10-CM

## 2017-02-01 DIAGNOSIS — I272 Pulmonary hypertension, unspecified: Secondary | ICD-10-CM | POA: Diagnosis present

## 2017-02-01 DIAGNOSIS — I5042 Chronic combined systolic (congestive) and diastolic (congestive) heart failure: Secondary | ICD-10-CM | POA: Diagnosis present

## 2017-02-01 DIAGNOSIS — K5790 Diverticulosis of intestine, part unspecified, without perforation or abscess without bleeding: Secondary | ICD-10-CM | POA: Diagnosis present

## 2017-02-01 DIAGNOSIS — J398 Other specified diseases of upper respiratory tract: Secondary | ICD-10-CM | POA: Diagnosis present

## 2017-02-01 DIAGNOSIS — G4733 Obstructive sleep apnea (adult) (pediatric): Secondary | ICD-10-CM | POA: Diagnosis present

## 2017-02-01 DIAGNOSIS — E1140 Type 2 diabetes mellitus with diabetic neuropathy, unspecified: Secondary | ICD-10-CM | POA: Diagnosis present

## 2017-02-01 DIAGNOSIS — Z8673 Personal history of transient ischemic attack (TIA), and cerebral infarction without residual deficits: Secondary | ICD-10-CM | POA: Diagnosis not present

## 2017-02-01 DIAGNOSIS — A419 Sepsis, unspecified organism: Secondary | ICD-10-CM | POA: Diagnosis present

## 2017-02-01 DIAGNOSIS — I252 Old myocardial infarction: Secondary | ICD-10-CM | POA: Diagnosis not present

## 2017-02-01 DIAGNOSIS — I5022 Chronic systolic (congestive) heart failure: Secondary | ICD-10-CM

## 2017-02-01 DIAGNOSIS — I428 Other cardiomyopathies: Secondary | ICD-10-CM | POA: Diagnosis not present

## 2017-02-01 DIAGNOSIS — I429 Cardiomyopathy, unspecified: Secondary | ICD-10-CM | POA: Diagnosis present

## 2017-02-01 DIAGNOSIS — Z90710 Acquired absence of both cervix and uterus: Secondary | ICD-10-CM | POA: Diagnosis not present

## 2017-02-01 DIAGNOSIS — N184 Chronic kidney disease, stage 4 (severe): Secondary | ICD-10-CM

## 2017-02-01 DIAGNOSIS — J189 Pneumonia, unspecified organism: Secondary | ICD-10-CM | POA: Diagnosis present

## 2017-02-01 DIAGNOSIS — Z833 Family history of diabetes mellitus: Secondary | ICD-10-CM | POA: Diagnosis not present

## 2017-02-01 DIAGNOSIS — I5032 Chronic diastolic (congestive) heart failure: Secondary | ICD-10-CM | POA: Diagnosis not present

## 2017-02-01 DIAGNOSIS — E039 Hypothyroidism, unspecified: Secondary | ICD-10-CM | POA: Diagnosis present

## 2017-02-01 DIAGNOSIS — K219 Gastro-esophageal reflux disease without esophagitis: Secondary | ICD-10-CM | POA: Diagnosis present

## 2017-02-01 DIAGNOSIS — Z6841 Body Mass Index (BMI) 40.0 and over, adult: Secondary | ICD-10-CM | POA: Diagnosis not present

## 2017-02-01 DIAGNOSIS — J181 Lobar pneumonia, unspecified organism: Secondary | ICD-10-CM | POA: Diagnosis not present

## 2017-02-01 DIAGNOSIS — Z8249 Family history of ischemic heart disease and other diseases of the circulatory system: Secondary | ICD-10-CM | POA: Diagnosis not present

## 2017-02-01 DIAGNOSIS — E669 Obesity, unspecified: Secondary | ICD-10-CM | POA: Diagnosis present

## 2017-02-01 DIAGNOSIS — R0602 Shortness of breath: Secondary | ICD-10-CM | POA: Diagnosis not present

## 2017-02-01 DIAGNOSIS — I2510 Atherosclerotic heart disease of native coronary artery without angina pectoris: Secondary | ICD-10-CM | POA: Diagnosis present

## 2017-02-01 LAB — CBC WITH DIFFERENTIAL/PLATELET
Basophils Absolute: 0 10*3/uL (ref 0.0–0.1)
Basophils Relative: 0 %
EOS ABS: 0 10*3/uL (ref 0.0–0.7)
EOS PCT: 0 %
HCT: 36.1 % (ref 36.0–46.0)
Hemoglobin: 11.2 g/dL — ABNORMAL LOW (ref 12.0–15.0)
LYMPHS ABS: 1 10*3/uL (ref 0.7–4.0)
Lymphocytes Relative: 10 %
MCH: 24.7 pg — AB (ref 26.0–34.0)
MCHC: 31 g/dL (ref 30.0–36.0)
MCV: 79.7 fL (ref 78.0–100.0)
MONO ABS: 0.2 10*3/uL (ref 0.1–1.0)
MONOS PCT: 2 %
Neutro Abs: 8.9 10*3/uL — ABNORMAL HIGH (ref 1.7–7.7)
Neutrophils Relative %: 88 %
PLATELETS: 134 10*3/uL — AB (ref 150–400)
RBC: 4.53 MIL/uL (ref 3.87–5.11)
RDW: 17.3 % — ABNORMAL HIGH (ref 11.5–15.5)
WBC: 10 10*3/uL (ref 4.0–10.5)

## 2017-02-01 LAB — GLUCOSE, CAPILLARY
GLUCOSE-CAPILLARY: 247 mg/dL — AB (ref 65–99)
Glucose-Capillary: 418 mg/dL — ABNORMAL HIGH (ref 65–99)
Glucose-Capillary: 421 mg/dL — ABNORMAL HIGH (ref 65–99)

## 2017-02-01 LAB — BASIC METABOLIC PANEL
Anion gap: 15 (ref 5–15)
BUN: 45 mg/dL — AB (ref 6–20)
CHLORIDE: 97 mmol/L — AB (ref 101–111)
CO2: 25 mmol/L (ref 22–32)
CREATININE: 2.23 mg/dL — AB (ref 0.44–1.00)
Calcium: 7.9 mg/dL — ABNORMAL LOW (ref 8.9–10.3)
GFR calc non Af Amer: 21 mL/min — ABNORMAL LOW (ref >60)
GFR, EST AFRICAN AMERICAN: 24 mL/min — AB (ref >60)
Glucose, Bld: 295 mg/dL — ABNORMAL HIGH (ref 65–99)
Potassium: 3.8 mmol/L (ref 3.5–5.1)
Sodium: 137 mmol/L (ref 135–145)

## 2017-02-01 LAB — STREP PNEUMONIAE URINARY ANTIGEN: STREP PNEUMO URINARY ANTIGEN: NEGATIVE

## 2017-02-01 LAB — EXPECTORATED SPUTUM ASSESSMENT W GRAM STAIN, RFLX TO RESP C

## 2017-02-01 LAB — EXPECTORATED SPUTUM ASSESSMENT W REFEX TO RESP CULTURE

## 2017-02-01 MED ORDER — IPRATROPIUM-ALBUTEROL 0.5-2.5 (3) MG/3ML IN SOLN
3.0000 mL | Freq: Two times a day (BID) | RESPIRATORY_TRACT | Status: DC
  Administered 2017-02-01 – 2017-02-02 (×2): 3 mL via RESPIRATORY_TRACT
  Filled 2017-02-01 (×2): qty 3

## 2017-02-01 MED ORDER — ENSURE ENLIVE PO LIQD
237.0000 mL | Freq: Two times a day (BID) | ORAL | Status: DC
  Administered 2017-02-01 – 2017-02-02 (×3): 237 mL via ORAL

## 2017-02-01 MED ORDER — INSULIN ASPART 100 UNIT/ML ~~LOC~~ SOLN
4.0000 [IU] | Freq: Once | SUBCUTANEOUS | Status: AC
  Administered 2017-02-01: 4 [IU] via SUBCUTANEOUS

## 2017-02-01 MED ORDER — TORSEMIDE 20 MG PO TABS
100.0000 mg | ORAL_TABLET | Freq: Two times a day (BID) | ORAL | Status: DC
  Administered 2017-02-01 – 2017-02-02 (×3): 100 mg via ORAL
  Filled 2017-02-01 (×3): qty 5

## 2017-02-01 MED ORDER — INSULIN ASPART 100 UNIT/ML ~~LOC~~ SOLN
7.0000 [IU] | Freq: Once | SUBCUTANEOUS | Status: AC
Start: 2017-02-01 — End: 2017-02-01
  Administered 2017-02-01: 7 [IU] via SUBCUTANEOUS

## 2017-02-01 MED ORDER — INSULIN DETEMIR 100 UNIT/ML ~~LOC~~ SOLN
40.0000 [IU] | Freq: Two times a day (BID) | SUBCUTANEOUS | Status: DC
  Administered 2017-02-01 – 2017-02-02 (×3): 40 [IU] via SUBCUTANEOUS
  Filled 2017-02-01 (×4): qty 0.4

## 2017-02-01 MED ORDER — INSULIN ASPART 100 UNIT/ML ~~LOC~~ SOLN: 0.0000 [IU] | Freq: Every day | SUBCUTANEOUS | Status: DC

## 2017-02-01 MED ORDER — INSULIN ASPART 100 UNIT/ML ~~LOC~~ SOLN
8.0000 [IU] | Freq: Once | SUBCUTANEOUS | Status: AC
  Administered 2017-02-01: 8 [IU] via SUBCUTANEOUS

## 2017-02-01 MED ORDER — INSULIN ASPART 100 UNIT/ML ~~LOC~~ SOLN
0.0000 [IU] | Freq: Three times a day (TID) | SUBCUTANEOUS | Status: DC
  Administered 2017-02-01: 9 [IU] via SUBCUTANEOUS
  Administered 2017-02-02: 5 [IU] via SUBCUTANEOUS
  Administered 2017-02-02: 2 [IU] via SUBCUTANEOUS

## 2017-02-01 NOTE — Progress Notes (Addendum)
PROGRESS NOTE  Grace Bradford XBL:[id number] DOB: 02-Nov-1945 DOA: 01/31/2017 PCP: Jani Gravel, MD   LOS: 0 days   Brief Narrative / Interim history: 71 year old female with history of hypertension, prior stroke, chronic systolic CHF, chronic bronchitis, comes to the hospital with 2 days of fevers up to 102 at home, chills, as well as productive coughing and difficulties breathing.  She was found to have an infiltrate on the chest x-ray and was admitted for community-acquired pneumonia  Assessment & Plan: Principal Problem:   PNA (pneumonia) Active Problems:   Small airways disease   Nonischemic cardiomyopathy (HCC)   Hx of stroke without residual deficits   Chronic systolic heart failure (HCC)   Tracheomalacia   OSA (obstructive sleep apnea)   Pulmonary HTN (HCC)   Type 2 diabetes mellitus (HCC)   Chronic diastolic heart failure (HCC)   CKD (chronic kidney disease), stage IV (HCC)   Sepsis due to lobar pneumonia -Patient met criteria for sepsis on admission with fever, tachypnea, blood cultures were obtained that are pending, she was started on Levaquin, clinically she is stable and continue that for now. -Monitor blood cultures -Influenza panel negative -Strep pneumo negative -Still complains of shortness of breath, continue supportive treatment with nebulizer prn  Small airway disease/chronic bronchitis -PRN duo nebs, placed on oral steroids,  Chronic systolic CHF in the setting of nonischemic cardiomyopathy -Clinically she appears euvolemic, and has no lower extremity edema, however chest x-ray shows some pulmonary vascular congestion and she has faint crackles, she is on room air though.  Sepsis physiology resolved, resume home torsemide  Chronic kidney disease stage IV -Creatinine appears at baseline, resume torsemide, closely monitor renal function  Type 2 diabetes mellitus -Continue sliding scale insulin, levemir  History of stroke without residual  deficits -Continue aspirin  Hypertension -continue Coreg, Demadex controlled at 140/66 today   DVT prophylaxis: SCD Code Status: Full code Family Communication: No family at bedside Disposition Plan: Home when ready, likely 24-48 hours  Consultants:   None  Procedures:   None   Antimicrobials:  Levaquin 10/8 >>  Subjective: -Still complains of shortness of breath, feels however little bit improved compared to last night  Objective: Vitals:   01/31/17 2347 02/01/17 0236 02/01/17 0532 02/01/17 0913  BP: (!) 154/84  140/66   Pulse: 71  74 74  Resp: _0 Temp: 99 F (37.2 C)  98.2 F (36.8 C)   TempSrc: Oral  Oral   SpO2: 95% 98% 93% 98%  Weight:      Height:        Intake/Output Summary (Last 24 hours) at 02/01/17 1039 Last data filed at 02/01/17 0500  Gross per 24 hour  Intake                0 ml  Output                0 ml  Net                0 ml   Filed Weights   01/31/17 1935  Weight: 104.8 kg (231 lb)    Examination:  Constitutional: NAD Eyes: lids and conjunctivae normal ENMT: Mucous membranes are moist Respiratory: no wheezing, faint bibasilar crackles. Normal respiratory effort. No accessory muscle use.  Cardiovascular: Regular rate and rhythm, no murmurs / rubs / gallops. No LE edema. Abdomen: no tenderness. Bowel sounds positive.  Skin: no rashes Neurologic: CN 2-12 grossly intact. Strength 5/5 in all 4.  Psychiatric: Normal judgment and insight. Alert and oriented x 3. Normal mood.    Data Reviewed: I have independently reviewed following labs and imaging studies   CBC:  Recent Labs Lab 01/31/17 2021 02/01/17 0341  WBC 11.5* 10.0  NEUTROABS 8.5* 8.9*  HGB 12.1 11.2*  HCT 37.8 36.1  MCV 80.3 79.7  PLT 131* 381*   Basic Metabolic Panel:  Recent Labs Lab 01/31/17 2021 02/01/17 0341  NA 139 137  K 4.3 3.8  CL 99* 97*  CO2 27 25  GLUCOSE 177* 295*  BUN 43* 45*  CREATININE 2.28* 2.23*  CALCIUM 8.2* 7.9*    GFR: Estimated Creatinine Clearance: 26.7 mL/min (A) (by C-G formula based on SCr of 2.23 mg/dL (H)). Liver Function Tests:  Recent Labs Lab 01/31/17 2021  AST 17  ALT 11*  ALKPHOS 68  BILITOT 0.7  PROT 7.5  ALBUMIN 3.4*   No results for input(s): LIPASE, AMYLASE in the last 168 hours. No results for input(s): AMMONIA in the last 168 hours. Coagulation Profile: No results for input(s): INR, PROTIME in the last 168 hours. Cardiac Enzymes: No results for input(s): CKTOTAL, CKMB, CKMBINDEX, TROPONINI in the last 168 hours. BNP (last 3 results) No results for input(s): PROBNP in the last 8760 hours. HbA1C: No results for input(s): HGBA1C in the last 72 hours. CBG:  Recent Labs Lab 01/25/17 1448 02/01/17 0015  GLUCAP 109* 247*   Lipid Profile: No results for input(s): CHOL, HDL, LDLCALC, TRIG, CHOLHDL, LDLDIRECT in the last 72 hours. Thyroid Function Tests: No results for input(s): TSH, T4TOTAL, FREET4, T3FREE, THYROIDAB in the last 72 hours. Anemia Panel: No results for input(s): VITAMINB12, FOLATE, FERRITIN, TIBC, IRON, RETICCTPCT in the last 72 hours. Urine analysis:    Component Value Date/Time   COLORURINE YELLOW 10/17/2014 2232   APPEARANCEUR CLOUDY (A) 10/17/2014 2232   LABSPEC 1.013 10/17/2014 2232   PHURINE 5.5 10/17/2014 2232   GLUCOSEU NEGATIVE 10/17/2014 2232   HGBUR NEGATIVE 10/17/2014 2232   BILIRUBINUR NEGATIVE 10/17/2014 2232   KETONESUR NEGATIVE 10/17/2014 2232   PROTEINUR NEGATIVE 10/17/2014 2232   UROBILINOGEN 0.2 10/17/2014 2232   NITRITE NEGATIVE 10/17/2014 2232   LEUKOCYTESUR LARGE (A) 10/17/2014 2232   Sepsis Labs: Invalid input(s): PROCALCITONIN, LACTICIDVEN  Recent Results (from the past 240 hour(s))  Culture, sputum-assessment     Status: None   Collection Time: 02/01/17  7:28 AM  Result Value Ref Range Status   Specimen Description EXPECTORATED SPUTUM  Final   Special Requests NONE  Final   Sputum evaluation THIS SPECIMEN IS  ACCEPTABLE FOR SPUTUM CULTURE  Final   Report Status 02/01/2017 FINAL  Final      Radiology Studies: Dg Chest 2 View  Result Date: 01/31/2017 CLINICAL DATA:  Shortness of breath and productive cough for 3 days. EXAM: CHEST  2 VIEW COMPARISON:  07/10/2016 FINDINGS: The heart is enlarged but appears stable. There is mild tortuosity of the thoracic aorta. Stable slightly prominent pulmonary hila. Prominent vascular markings suggesting vascular congestion. Right upper lobe density is worrisome for pneumonia. No definite pleural effusions. IMPRESSION: Cardiac enlargement with vascular congestion. Right upper lobe infiltrate. Electronically Signed   By: Marijo Sanes M.D.   On: 01/31/2017 20:13     Scheduled Meds: . aspirin EC  81 mg Oral Daily  . carvedilol  9.375 mg Oral BID WC  . clopidogrel  75 mg Oral Daily  . feeding supplement (ENSURE ENLIVE)  237 mL Oral BID BM  . ipratropium-albuterol  3 mL  Nebulization Q6H  . predniSONE  20 mg Oral BID WC  . sodium chloride flush  3 mL Intravenous Q12H  . torsemide  100 mg Oral BID   Continuous Infusions: . sodium chloride    . [START ON 02/02/2017] levofloxacin (LEVAQUIN) IV      Marzetta Board, MD, PhD [facility] Pager [phone]  If 7PM-7AM, please contact night-coverage [email or web address] Password [email or web address] 02/01/2017, 10:39 AM
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
## 2017-02-01 NOTE — Care Management Obs Status (Signed)
[place] NOTIFICATION   Patient Details  Name: Grace Bradford MRN: [id number] Date of Birth: 1945/11/15   Medicare Observation Status Notification Given:  Yes    Carles Collet, RN 02/01/2017, 2:52 PM

## 2017-02-01 NOTE — Progress Notes (Signed)
Patient's CBG 247. On-call NP Schorr notified. No new orders at this time.  Will continue to monitor patient and notify as needed.

## 2017-02-01 NOTE — Progress Notes (Signed)
Pt CBG was 418 at 1710. MD Gherghe notified, will continue to treat per protocol and per MD order.

## 2017-02-01 NOTE — Progress Notes (Signed)
Nutrition Brief Note  Patient identified on the Malnutrition Screening Tool (MST) Report  Wt Readings from Last 15 Encounters:  01/31/17 231 lb (104.8 kg)  01/27/17 233 lb 4 oz (105.8 kg)  01/25/17 230 lb 6.1 oz (104.5 kg)  01/20/17 235 lb 7.2 oz (106.8 kg)  01/18/17 232 lb 9.4 oz (105.5 kg)  01/06/17 233 lb 0.4 oz (105.7 kg)  12/28/16 231 lb (104.8 kg)  12/21/16 239 lb 10.2 oz (108.7 kg)  12/09/16 238 lb 8.6 oz (108.2 kg)  12/07/16 238 lb 12.1 oz (108.3 kg)  10/15/16 244 lb 14.9 oz (111.1 kg)  10/05/16 241 lb 12 oz (109.7 kg)  09/26/16 241 lb (109.3 kg)  09/23/16 242 lb 6.4 oz (110 kg)  09/06/16 244 lb (110.7 kg)    Pt admitted with pneumonia and bronchitis.   Spoke with pt at bedside. Pt and family report 30# wt loss over the past 3 months, due to decreased intake ("I didn't feel like eating"). Intake has improved greatly over the past several months since family members have assisted with meal preparation and ensuring pt eats on a schedule. Pt typically eats a large meal daily (ex tacos) and snacks on foods every few hours such as peanut butter crackers and fruit. Pt consumes on Ensure Plus supplement daily and often consumes drinks with added protein powder.   Reviewed wt hx; pt has experienced modest wt loss, which is favorable due to obesity. However, wt changes are not significant for time frames within the past year.   Nutrition-Focused physical exam completed. Findings are no fat depletion, no muscle depletion, and no edema.   Pt requesting continuing supplements while here to mimic home regimen.   Medications reviewed and include prednisone and demadex.   Labs reviewed: CBGS: 247.   No recent Hgb A1c to assess for DM control. Home DM medications 1.8 mg victoza daily and insulin detemir (60 units q AM and 80 units q PM).   Body mass index is 42.25 kg/m. Patient meets criteria for extreme obesity, class III based on current BMI.   Current diet order is heart healthy/  carb modified, patient is consuming approximately 75% of meals at this time. Labs and medications reviewed.   No nutrition interventions warranted at this time. If nutrition issues arise, please consult RD.   Hagan Maltz A. Jimmye Norman, RD, LDN, CDE Pager: [phone] After hours Pager: [phone]

## 2017-02-01 NOTE — Progress Notes (Signed)
Scanner broken and no other computer available to replace scanner.

## 2017-02-01 NOTE — Progress Notes (Signed)
Patient CBG is 421.  Paged on call K. Schorr .  Patient night coverage of novolog doesn't cover CBG > 400.  Will continue to monitor and wait for MD response.

## 2017-02-02 LAB — GLUCOSE, CAPILLARY
Glucose-Capillary: 170 mg/dL — ABNORMAL HIGH (ref 65–99)
Glucose-Capillary: 277 mg/dL — ABNORMAL HIGH (ref 65–99)

## 2017-02-02 MED ORDER — SILDENAFIL CITRATE 20 MG PO TABS
60.0000 mg | ORAL_TABLET | Freq: Three times a day (TID) | ORAL | Status: DC
  Administered 2017-02-02: 60 mg via ORAL
  Filled 2017-02-02 (×2): qty 3

## 2017-02-02 MED ORDER — LEVOTHYROXINE SODIUM 25 MCG PO TABS
125.0000 ug | ORAL_TABLET | Freq: Every day | ORAL | Status: DC
  Administered 2017-02-02: 125 ug via ORAL

## 2017-02-02 MED ORDER — INSULIN DETEMIR 100 UNIT/ML FLEXPEN: 40.0000 [IU] | PEN_INJECTOR | Freq: Two times a day (BID) | SUBCUTANEOUS | 0 refills | Status: DC

## 2017-02-02 MED ORDER — PREGABALIN 75 MG PO CAPS
75.0000 mg | ORAL_CAPSULE | Freq: Two times a day (BID) | ORAL | Status: DC
  Administered 2017-02-02: 75 mg via ORAL
  Filled 2017-02-02: qty 1

## 2017-02-02 MED ORDER — LEVOFLOXACIN 500 MG PO TABS: 500.0000 mg | ORAL_TABLET | Freq: Every day | ORAL | 0 refills | Status: AC

## 2017-02-02 MED ORDER — DIAZEPAM 5 MG PO TABS: 5.0000 mg | ORAL_TABLET | Freq: Two times a day (BID) | ORAL | Status: DC | PRN

## 2017-02-02 NOTE — Discharge Summary (Signed)
Physician Discharge Summary  Grace Bradford Grace Bradford DOB: Oct 25, 1945 DOA: 01/31/2017  PCP: Jani Gravel, MD  Admit date: 01/31/2017 Discharge date: 02/02/2017  Admitted From: Home Disposition: Home   Recommendations for Outpatient Follow-up:  1. Follow up with PCP in 1 week 2. Please obtain BMP in 1 week  3. Please follow up on the following pending results: sputum culture is pending  Home Health: None  Equipment/Devices: None   Discharge Condition: Stable CODE STATUS: Full  Diet recommendation: Heart healthy   Brief/Interim Summary: From H&P: Grace Bradford is a 71 yo female with hx of hypertension, CVA, chronic systolic heart failure, chronic bronchitis presents to the hospital with fever of 102, chills, productive cough and shortness of breath. Workup has revealed right upper lobe community-acquired pneumonia as well as vascular congestion. She was treated with Levaquin with improvement in symptoms. Influenza PCR, strep pneumo antigen were negative. See below for further details.   Discharge Diagnoses:  Principal Problem:   PNA (pneumonia) Active Problems:   Small airways disease   Nonischemic cardiomyopathy (HCC)   Hx of stroke without residual deficits   Chronic systolic heart failure (HCC)   Tracheomalacia   OSA (obstructive sleep apnea)   Pulmonary HTN (HCC)   Type 2 diabetes mellitus (HCC)   Chronic diastolic heart failure (HCC)   CKD (chronic kidney disease), stage IV (HCC)   Pneumonia   Sepsis due to lobar pneumonia -RUL infiltrate on CXR -Influenza panel negative -Strep pneumo negative -Symptomatic improvement today, discharge home with oral levaquin   Small airway disease/chronic bronchitis -PRN duonebs  Chronic systolic CHF in the setting of nonischemic cardiomyopathy -Clinically she appears euvolemic, and has no lower extremity edema, however chest x-ray shows some pulmonary vascular congestion and she has faint crackles, she is on room air. Resume  home diuretic, entresto   Chronic kidney disease stage IV -Creatinine appears at baseline, resume torsemide, closely monitor renal function  Type 2 diabetes mellitus -Continue sliding scale insulin, levemir  History of stroke without residual deficits -Continue aspirin, plavix   Hypertension -Continue Coreg, Demadex  -BP stable   Pulmonary hypertension -Continue sildenafil  Hypothyroidism -Continue Synthroid   Discharge Instructions  Discharge Instructions    Call MD for:  difficulty breathing, headache or visual disturbances    Complete by:  As directed    Call MD for:  extreme fatigue    Complete by:  As directed    Call MD for:  hives    Complete by:  As directed    Call MD for:  persistant dizziness or light-headedness    Complete by:  As directed    Call MD for:  persistant nausea and vomiting    Complete by:  As directed    Call MD for:  severe uncontrolled pain    Complete by:  As directed    Call MD for:  temperature >100.4    Complete by:  As directed    Diet - low sodium heart healthy    Complete by:  As directed    Discharge instructions    Complete by:  As directed    You were cared for by a hospitalist during your hospital stay. If you have any questions about your discharge medications or the care you received while you were in the hospital after you are discharged, you can call the unit and asked to speak with the hospitalist on call if the hospitalist that took care of you is not available. Once you are  discharged, your primary care physician will handle any further medical issues. Please note that NO REFILLS for any discharge medications will be authorized once you are discharged, as it is imperative that you return to your primary care physician (or establish a relationship with a primary care physician if you do not have one) for your aftercare needs so that they can reassess your need for medications and monitor your lab values.   Increase activity  slowly    Complete by:  As directed      Allergies as of 02/02/2017      Reactions   Rocephin [ceftriaxone Sodium In Dextrose] Itching      Medication List    STOP taking these medications   benzonatate 200 MG capsule Commonly known as:  TESSALON   chlorpheniramine-HYDROcodone 10-8 MG/5ML Suer Commonly known as:  TUSSIONEX   cyclobenzaprine 10 MG tablet Commonly known as:  FLEXERIL   levalbuterol 0.63 MG/3ML nebulizer solution Commonly known as:  XOPENEX   omeprazole 20 MG capsule Commonly known as:  PRILOSEC     TAKE these medications   acetaminophen 500 MG tablet Commonly known as:  TYLENOL Take 1,000 mg by mouth every 6 (six) hours as needed for mild pain or moderate pain.   ARNICA EX Apply 1 application topically 2 (two) times daily as needed (pain).   aspirin EC 81 MG tablet Take 81 mg by mouth daily.   calcitRIOL 0.25 MCG capsule Commonly known as:  ROCALTROL Take 0.3 mcg by mouth daily.   CALCIUM 600 + D PO Take 1 tablet by mouth daily.   carvedilol 6.25 MG tablet Commonly known as:  COREG Take 1.5 tablets (9.375 mg total) by mouth 2 (two) times daily with a meal.   CASCARA SAGRADA PO Take 450 mg by mouth daily.   cetirizine 10 MG tablet Commonly known as:  ZYRTEC Take 10 mg by mouth daily.   clopidogrel 75 MG tablet Commonly known as:  PLAVIX Take 75 mg by mouth daily.   DIADERM FOOT REJUVENATING EX Apply 1 application topically 2 (two) times daily.   diazepam 5 MG tablet Commonly known as:  VALIUM Take 5 mg by mouth 2 (two) times daily.   ENTRESTO 97-103 MG Generic drug:  sacubitril-valsartan take 1 tablet by mouth twice a day   febuxostat 40 MG tablet Commonly known as:  ULORIC Take 40 mg by mouth daily.   fluticasone 50 MCG/ACT nasal spray Commonly known as:  FLONASE Place 2 sprays into both nostrils 2 (two) times daily as needed for allergies.   GINGER ROOT PO Take 1 capsule by mouth daily.   guaiFENesin 600 MG 12 hr  tablet Commonly known as:  MUCINEX Take 1,200 mg by mouth 2 (two) times daily.   HYDROcodone-acetaminophen 7.5-325 MG tablet Commonly known as:  NORCO Take 1 tablet by mouth 3 (three) times daily as needed for moderate pain.   hydrOXYzine 25 MG tablet Commonly known as:  ATARAX/VISTARIL Take 25 mg by mouth every 6 (six) hours as needed for itching.   Insulin Detemir 100 UNIT/ML Pen Commonly known as:  LEVEMIR FLEXPEN Inject 40 Units into the skin 2 (two) times daily. What changed:  how much to take  additional instructions   ivabradine 7.5 MG Tabs tablet Commonly known as:  CORLANOR Take 1 tablet (7.5 mg total) by mouth 2 (two) times daily with a meal.   levofloxacin 500 MG tablet Commonly known as:  LEVAQUIN Take 1 tablet (500 mg total) by mouth daily.  levothyroxine 125 MCG tablet Commonly known as:  SYNTHROID, LEVOTHROID Take 125 mcg by mouth daily.   lidocaine 5 % ointment Commonly known as:  XYLOCAINE Apply 1 application topically as needed (numbs skin when itching).   metaxalone 800 MG tablet Commonly known as:  SKELAXIN Take 800 mg by mouth daily.   metolazone 2.5 MG tablet Commonly known as:  ZAROXOLYN take 1 tablet by mouth once daily if needed   multivitamin with minerals Tabs tablet Take 1 tablet by mouth daily.   Omeprazole-Sodium Bicarbonate 20-1100 MG Caps capsule Commonly known as:  ZEGERID Take 1 capsule by mouth daily.   ondansetron 4 MG disintegrating tablet Commonly known as:  ZOFRAN ODT Take 1 tablet (4 mg total) by mouth every 8 (eight) hours as needed for nausea or vomiting.   OVER THE COUNTER MEDICATION Apply 1 application topically 3 times/day as needed-between meals & bedtime (joint pain). 2 old goats   potassium chloride SA 20 MEQ tablet Commonly known as:  K-DUR,KLOR-CON Take 1 tablet (20 mEq total) by mouth 2 (two) times daily.   pravastatin 80 MG tablet Commonly known as:  PRAVACHOL Take 80 mg by mouth Daily.    pregabalin 75 MG capsule Commonly known as:  LYRICA Take 75 mg by mouth 2 (two) times daily.   PROBIOTIC PO Take 1 capsule by mouth daily.   sildenafil 20 MG tablet Commonly known as:  REVATIO TAKE 3 TABLETS (60MG ) BY MOUTH THREE TIMES DAILY. CALL [phone] FOR REFILLS (GENERIC FOR REVATIO)   tolterodine 4 MG 24 hr capsule Commonly known as:  DETROL LA Take 8 mg by mouth daily.   torsemide 100 MG tablet Commonly known as:  DEMADEX Take 1 tablet (100 mg total) by mouth 2 (two) times daily.   VICTOZA 18 MG/3ML Sopn Generic drug:  liraglutide Inject 1.8 mg into the skin at bedtime.       Allergies  Allergen Reactions  . Rocephin [Ceftriaxone Sodium In Dextrose] Itching    Consultations:  None   Procedures/Studies: Dg Chest 2 View  Result Date: 01/31/2017 CLINICAL DATA:  Shortness of breath and productive cough for 3 days. EXAM: CHEST  2 VIEW COMPARISON:  07/10/2016 FINDINGS: The heart is enlarged but appears stable. There is mild tortuosity of the thoracic aorta. Stable slightly prominent pulmonary hila. Prominent vascular markings suggesting vascular congestion. Right upper lobe density is worrisome for pneumonia. No definite pleural effusions. IMPRESSION: Cardiac enlargement with vascular congestion. Right upper lobe infiltrate. Electronically Signed   By: Marijo Sanes M.D.   On: 01/31/2017 20:13      Discharge Exam: Vitals:   02/02/17 0923 02/02/17 1303  BP:  126/71  Pulse: 72 67  Resp: 18 16  Temp:  97.8 F (36.6 C)  SpO2: 96% 96%   Vitals:   02/01/17 2100 02/02/17 0517 02/02/17 0923 02/02/17 1303  BP: 140/76 124/69  126/71  Pulse: 69 73 72 67  Resp: 18 18 18 16   Temp: (!) 97.4 F (36.3 C) (!) 97.3 F (36.3 C)  97.8 F (36.6 C)  TempSrc: Oral Oral  Oral  SpO2: 96% 94% 96% 96%  Weight:      Height:        General: Pt is alert, awake, not in acute distress Cardiovascular: RRR, S1/S2 +, no rubs, no gallops Respiratory: CTA bilaterally, no  wheezing, no rhonchi Abdominal: Soft, NT, ND, bowel sounds + Extremities: no edema, no cyanosis    The results of significant diagnostics from this hospitalization (including imaging,  microbiology, ancillary and laboratory) are listed below for reference.     Microbiology: Recent Results (from the past 240 hour(s))  Blood Culture (routine x 2)     Status: None (Preliminary result)   Collection Time: 01/31/17  9:14 PM  Result Value Ref Range Status   Specimen Description BLOOD RIGHT FOREARM  Final   Special Requests   Final    BOTTLES DRAWN AEROBIC AND ANAEROBIC Blood Culture adequate volume   Culture NO GROWTH 2 DAYS  Final   Report Status PENDING  Incomplete  Blood Culture (routine x 2)     Status: None (Preliminary result)   Collection Time: 01/31/17  9:22 PM  Result Value Ref Range Status   Specimen Description BLOOD RIGHT ARM  Final   Special Requests   Final    BOTTLES DRAWN AEROBIC AND ANAEROBIC Blood Culture adequate volume   Culture NO GROWTH 2 DAYS  Final   Report Status PENDING  Incomplete  Culture, sputum-assessment     Status: None   Collection Time: 02/01/17  7:28 AM  Result Value Ref Range Status   Specimen Description EXPECTORATED SPUTUM  Final   Special Requests NONE  Final   Sputum evaluation THIS SPECIMEN IS ACCEPTABLE FOR SPUTUM CULTURE  Final   Report Status 02/01/2017 FINAL  Final  Culture, respiratory (NON-Expectorated)     Status: None (Preliminary result)   Collection Time: 02/01/17  7:28 AM  Result Value Ref Range Status   Specimen Description EXPECTORATED SPUTUM  Final   Special Requests NONE Reflexed from [id number]  Final   Gram Stain PENDING  Incomplete   Culture CULTURE REINCUBATED FOR BETTER GROWTH  Final   Report Status PENDING  Incomplete     Labs: BNP (last 3 results)  Recent Labs  09/06/16 1252 01/31/17 2021  BNP 395.4* 3,267.1*   Basic Metabolic Panel:  Recent Labs Lab 01/31/17 2021 02/01/17 0341  NA 139 137  K 4.3 3.8  CL  99* 97*  CO2 27 25  GLUCOSE 177* 295*  BUN 43* 45*  CREATININE 2.28* 2.23*  CALCIUM 8.2* 7.9*   Liver Function Tests:  Recent Labs Lab 01/31/17 2021  AST 17  ALT 11*  ALKPHOS 68  BILITOT 0.7  PROT 7.5  ALBUMIN 3.4*   No results for input(s): LIPASE, AMYLASE in the last 168 hours. No results for input(s): AMMONIA in the last 168 hours. CBC:  Recent Labs Lab 01/31/17 2021 02/01/17 0341  WBC 11.5* 10.0  NEUTROABS 8.5* 8.9*  HGB 12.1 11.2*  HCT 37.8 36.1  MCV 80.3 79.7  PLT 131* 134*   Cardiac Enzymes: No results for input(s): CKTOTAL, CKMB, CKMBINDEX, TROPONINI in the last 168 hours. BNP: Invalid input(s): POCBNP CBG:  Recent Labs Lab 02/01/17 0015 02/01/17 1704 02/01/17 2058 02/02/17 0802 02/02/17 1219  GLUCAP 247* 418* 421* 170* 277*   D-Dimer No results for input(s): DDIMER in the last 72 hours. Hgb A1c No results for input(s): HGBA1C in the last 72 hours. Lipid Profile No results for input(s): CHOL, HDL, LDLCALC, TRIG, CHOLHDL, LDLDIRECT in the last 72 hours. Thyroid function studies No results for input(s): TSH, T4TOTAL, T3FREE, THYROIDAB in the last 72 hours.  Invalid input(s): FREET3 Anemia work up No results for input(s): VITAMINB12, FOLATE, FERRITIN, TIBC, IRON, RETICCTPCT in the last 72 hours. Urinalysis    Component Value Date/Time   COLORURINE YELLOW 10/17/2014 2232   APPEARANCEUR CLOUDY (A) 10/17/2014 2232   LABSPEC 1.013 10/17/2014 2232   PHURINE 5.5 10/17/2014 2232  GLUCOSEU NEGATIVE 10/17/2014 2232   HGBUR NEGATIVE 10/17/2014 2232   BILIRUBINUR NEGATIVE 10/17/2014 2232   KETONESUR NEGATIVE 10/17/2014 2232   PROTEINUR NEGATIVE 10/17/2014 2232   UROBILINOGEN 0.2 10/17/2014 2232   NITRITE NEGATIVE 10/17/2014 2232   LEUKOCYTESUR LARGE (A) 10/17/2014 2232   Sepsis Labs Invalid input(s): PROCALCITONIN,  WBC,  LACTICIDVEN Microbiology Recent Results (from the past 240 hour(s))  Blood Culture (routine x 2)     Status: None  (Preliminary result)   Collection Time: 01/31/17  9:14 PM  Result Value Ref Range Status   Specimen Description BLOOD RIGHT FOREARM  Final   Special Requests   Final    BOTTLES DRAWN AEROBIC AND ANAEROBIC Blood Culture adequate volume   Culture NO GROWTH 2 DAYS  Final   Report Status PENDING  Incomplete  Blood Culture (routine x 2)     Status: None (Preliminary result)   Collection Time: 01/31/17  9:22 PM  Result Value Ref Range Status   Specimen Description BLOOD RIGHT ARM  Final   Special Requests   Final    BOTTLES DRAWN AEROBIC AND ANAEROBIC Blood Culture adequate volume   Culture NO GROWTH 2 DAYS  Final   Report Status PENDING  Incomplete  Culture, sputum-assessment     Status: None   Collection Time: 02/01/17  7:28 AM  Result Value Ref Range Status   Specimen Description EXPECTORATED SPUTUM  Final   Special Requests NONE  Final   Sputum evaluation THIS SPECIMEN IS ACCEPTABLE FOR SPUTUM CULTURE  Final   Report Status 02/01/2017 FINAL  Final  Culture, respiratory (NON-Expectorated)     Status: None (Preliminary result)   Collection Time: 02/01/17  7:28 AM  Result Value Ref Range Status   Specimen Description EXPECTORATED SPUTUM  Final   Special Requests NONE Reflexed from [id number]  Final   Gram Stain PENDING  Incomplete   Culture CULTURE REINCUBATED FOR BETTER GROWTH  Final   Report Status PENDING  Incomplete     Time coordinating discharge: 40 minutes  SIGNED:  Dessa Phi, DO [facility] Pager [phone]  If 7PM-7AM, please contact night-coverage [email or web address] Password [email or web address] 02/02/2017, 4:13 PM

## 2017-02-02 NOTE — Progress Notes (Signed)
Patient was discharged home by MD order; discharged instructions review and give to patient with care notes; IV DIC;  patient will be escorted to the car by nurse tech via wheelchair.  

## 2017-02-02 NOTE — Progress Notes (Signed)
Received an verbal order from NP K. Schorr from [facility] ordering 8 units of Novolog to be administered as a one time dose for night coverage.  Will carry out of the order and continue to monitor the patietn

## 2017-02-03 ENCOUNTER — Encounter (HOSPITAL_COMMUNITY): Payer: Medicare Other

## 2017-02-03 NOTE — Consult Note (Signed)
           [facility] CM Primary Care Navigator  02/03/2017  KETZALY CARDELLA [birth year] [id number]   Wentto seepatient at the bedsideto identify possible discharge needs but she was alreadydischargedper staff report.  Patient was discharged home yesterday.  Primary care provider's officeis listed as doing transition of care (TOC).  Patient has a discharge instruction to follow-up with primary care provider withina week.  For questions, please contact:  Dannielle Huh, BSN, RN- [facility] Primary Care Navigator  Telephone: [phone] [place]

## 2017-02-04 LAB — CULTURE, RESPIRATORY: CULTURE: NORMAL

## 2017-02-05 LAB — CULTURE, BLOOD (ROUTINE X 2)
CULTURE: NO GROWTH
Culture: NO GROWTH
SPECIAL REQUESTS: ADEQUATE
Special Requests: ADEQUATE

## 2017-02-08 ENCOUNTER — Encounter (HOSPITAL_COMMUNITY): Payer: Medicare Other

## 2017-02-10 ENCOUNTER — Encounter (HOSPITAL_COMMUNITY): Payer: Medicare Other

## 2017-02-14 NOTE — Progress Notes (Signed)
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 matter. There is an overview of "What is Oxygen" and "Why do we need it".  Instructor also reviews how to create a safe environment for oxygen use,  the importance of using oxygen as prescribed, and the risks of noncompliance. There is a brief discussion on traveling with oxygen and resources the patient may utilize.   Oxygen Equipment:  -Group instruction provided by [facility] Staff utilizing handouts, written materials, and equipment demonstrations.   Signs and Symptoms:  -Group instruction provided by written material and verbal discussion to support subject matter. Warning signs and symptoms of infection, stroke, and heart attack are reviewed and when to call the physician/911 reinforced. Tips for preventing  the spread of infection discussed.   Advanced Directives:  -Group instruction provided by verbal instruction and written material to support subject matter. Instructor reviews Advanced Directive laws and proper instruction for filling out document.   Pulmonary Video:  -Group video education that reviews the importance of medication and oxygen compliance, exercise, good nutrition, pulmonary hygiene, and pursed lip and diaphragmatic breathing for the pulmonary patient.   PULMONARY REHAB OTHER RESPIRATORY from 01/27/2017 in [place]  Date  01/20/17  Instruction Review Code  2- meets goals/outcomes      Exercise for the Pulmonary Patient:  -Group instruction that is supported by a PowerPoint presentation. Instructor discusses benefits of exercise, core components of exercise, frequency, duration, and intensity of an exercise routine, importance of utilizing pulse oximetry during exercise, safety while exercising, and options of places to exercise outside of rehab.     Pulmonary Medications:  -Verbally interactive group education provided by instructor with focus on inhaled medications and proper administration.   Anatomy and Physiology of the Respiratory System and Intimacy:  -Group instruction provided by PowerPoint, verbal discussion, and written material to support subject matter. Instructor reviews respiratory cycle and anatomical components of the respiratory system and their functions. Instructor also reviews differences in obstructive and restrictive respiratory diseases with examples of each. Intimacy, Sex, and Sexuality differences are reviewed with a discussion on how relationships can change when diagnosed with pulmonary disease. Common sexual concerns are reviewed.   MD DAY -A group question and answer session with a medical doctor that allows participants to ask questions that relate to their pulmonary disease state.   OTHER EDUCATION -Group  or individual verbal, written, or video instructions that support the educational goals of the pulmonary rehab program.   Knowledge Questionnaire Score:     Knowledge Questionnaire Score - 12/09/16 1710      Knowledge Questionnaire Score   Pre Score 8/13      Core Components/Risk Factors/Patient Goals at Admission:     Personal Goals and Risk Factors at Admission - 10/15/16 1543      Core Components/Risk Factors/Patient Goals on Admission    Weight Management Obesity;Yes   Intervention Weight Management: Develop a combined nutrition and exercise program designed to reach desired caloric intake, while maintaining appropriate intake of nutrient and fiber, sodium and fats, and appropriate energy expenditure required for the weight goal.;Obesity: Provide education and appropriate resources to help participant work on and attain dietary goals.;Weight Management/Obesity: Establish reasonable short term and long term weight goals.   Expected Outcomes Short Term: Continue to assess and modify interventions until short term weight is achieved;Weight Loss: Understanding of general recommendations for a balanced deficit meal plan, which promotes 1-2 lb weight loss per week and includes a negative energy balance of [phone] kcal/d   Improve shortness of breath with ADL's Yes   Intervention Provide education, individualized exercise plan and daily activity instruction to help decrease symptoms of SOB with activities of daily living.  Expected Outcomes Short Term: Achieves a reduction of symptoms when performing activities of daily living.   Develop more efficient breathing techniques such as purse lipped breathing and diaphragmatic breathing; and practicing self-pacing with activity Yes   Intervention Provide education, demonstration and support about specific breathing techniuqes utilized for more efficient breathing. Include techniques such as pursed lipped breathing, diaphragmatic breathing and  self-pacing activity.   Expected Outcomes Short Term: Participant will be able to demonstrate and use breathing techniques as needed throughout daily activities.   Diabetes Yes   Intervention Provide education about signs/symptoms and action to take for hypo/hyperglycemia.;Provide education about proper nutrition, including hydration, and aerobic/resistive exercise prescription along with prescribed medications to achieve blood glucose in normal ranges: Fasting glucose 65-99 mg/dL   Expected Outcomes Short Term: Participant verbalizes understanding of the signs/symptoms and immediate care of hyper/hypoglycemia, proper foot care and importance of medication, aerobic/resistive exercise and nutrition plan for blood glucose control.;Long Term: Attainment of HbA1C < 7%.   Heart Failure Yes   Intervention Provide a combined exercise and nutrition program that is supplemented with education, support and counseling about heart failure. Directed toward relieving symptoms such as shortness of breath, decreased exercise tolerance, and extremity edema.   Expected Outcomes Improve functional capacity of life      Core Components/Risk Factors/Patient Goals Review:      Goals and Risk Factor Review    Row Name 12/22/16 1847 01/14/17 1332 01/14/17 1333 02/14/17 0808       Core Components/Risk Factors/Patient Goals Review   Personal Goals Review Weight Management/Obesity;Develop more efficient breathing techniques such as purse lipped breathing and diaphragmatic breathing and practicing self-pacing with activity.;Improve shortness of breath with ADL's;Diabetes;Heart Failure Weight Management/Obesity;Develop more efficient breathing techniques such as purse lipped breathing and diaphragmatic breathing and practicing self-pacing with activity.;Improve shortness of breath with ADL's;Diabetes;Heart Failure  - Weight Management/Obesity;Develop more efficient breathing techniques such as purse lipped breathing and  diaphragmatic breathing and practicing self-pacing with activity.;Improve shortness of breath with ADL's;Diabetes;Heart Failure    Review patient has only attended 3 sessions. unable to evaluate progress.   - patient has not made any progress towards pulmonary rehab goals. she has only attended 4 sessions sinces admission and none have been consecutive sessions. Will discuss with patient importance of consistant attendance in order to see progress towards goals. patient has not made any progress towards pulmonary rehab goals. Patients attendance is very inconsistant. She has not attended a session since 10/4.  Will discuss with patient importance of consistant attendance in order to see progress towards goals.    Expected Outcomes see admission outcomes  - see admission outcomes see admission outcomes       Core Components/Risk Factors/Patient Goals at Discharge (Final Review):      Goals and Risk Factor Review - 02/14/17 0808      Core Components/Risk Factors/Patient Goals Review   Personal Goals Review Weight Management/Obesity;Develop more efficient breathing techniques such as purse lipped breathing and diaphragmatic breathing and practicing self-pacing with activity.;Improve shortness of breath with ADL's;Diabetes;Heart Failure   Review patient has not made any progress towards pulmonary rehab goals. Patients attendance is very inconsistant. She has not attended a session since 10/4.  Will discuss with patient importance of consistant attendance in order to see progress towards goals.   Expected Outcomes see admission outcomes      ITP Comments:   Comments: ITP REVIEW Pt is making slow progress toward pulmonary rehab goals after completing 8 sessions related to inconsistent  attendance. Recommend continued exercise, life style modification, education, and utilization of breathing techniques to increase stamina and strength and decrease shortness of breath with exertion.

## 2017-02-15 ENCOUNTER — Encounter (HOSPITAL_COMMUNITY)
Admission: RE | Admit: 2017-02-15 | Discharge: 2017-02-15 | Disposition: A | Discharge status: Home / Self Care | Payer: Medicare Other | Source: Ambulatory Visit | Attending: Internal Medicine | Admitting: Internal Medicine

## 2017-02-15 DIAGNOSIS — E118 Type 2 diabetes mellitus with unspecified complications: Secondary | ICD-10-CM | POA: Diagnosis not present

## 2017-02-15 DIAGNOSIS — I10 Essential (primary) hypertension: Secondary | ICD-10-CM | POA: Diagnosis not present

## 2017-02-15 DIAGNOSIS — I272 Pulmonary hypertension, unspecified: Secondary | ICD-10-CM

## 2017-02-15 DIAGNOSIS — Z09 Encounter for follow-up examination after completed treatment for conditions other than malignant neoplasm: Secondary | ICD-10-CM | POA: Diagnosis not present

## 2017-02-17 ENCOUNTER — Encounter (HOSPITAL_COMMUNITY): Payer: Medicare Other

## 2017-02-22 ENCOUNTER — Encounter (HOSPITAL_COMMUNITY): Admission: RE | Admit: 2017-02-22 | Payer: Medicare Other | Source: Ambulatory Visit

## 2017-02-24 ENCOUNTER — Encounter (HOSPITAL_COMMUNITY): Admission: RE | Admit: 2017-02-24 | Payer: Medicare Other | Source: Ambulatory Visit

## 2017-03-01 ENCOUNTER — Encounter (HOSPITAL_COMMUNITY): Payer: Medicare Other

## 2017-03-03 ENCOUNTER — Encounter (HOSPITAL_COMMUNITY): Payer: Medicare Other

## 2017-03-08 ENCOUNTER — Encounter (HOSPITAL_COMMUNITY): Payer: Medicare Other

## 2017-03-08 DIAGNOSIS — M79641 Pain in right hand: Secondary | ICD-10-CM | POA: Diagnosis not present

## 2017-03-08 DIAGNOSIS — M170 Bilateral primary osteoarthritis of knee: Secondary | ICD-10-CM | POA: Diagnosis not present

## 2017-03-08 DIAGNOSIS — M19041 Primary osteoarthritis, right hand: Secondary | ICD-10-CM | POA: Diagnosis not present

## 2017-03-08 DIAGNOSIS — M79644 Pain in right finger(s): Secondary | ICD-10-CM | POA: Diagnosis not present

## 2017-03-08 DIAGNOSIS — M19042 Primary osteoarthritis, left hand: Secondary | ICD-10-CM | POA: Diagnosis not present

## 2017-03-08 DIAGNOSIS — M25562 Pain in left knee: Secondary | ICD-10-CM | POA: Diagnosis not present

## 2017-03-08 DIAGNOSIS — M25569 Pain in unspecified knee: Secondary | ICD-10-CM | POA: Diagnosis not present

## 2017-03-08 DIAGNOSIS — M79642 Pain in left hand: Secondary | ICD-10-CM | POA: Diagnosis not present

## 2017-03-08 DIAGNOSIS — M1A09X0 Idiopathic chronic gout, multiple sites, without tophus (tophi): Secondary | ICD-10-CM | POA: Diagnosis not present

## 2017-03-08 DIAGNOSIS — M150 Primary generalized (osteo)arthritis: Secondary | ICD-10-CM | POA: Diagnosis not present

## 2017-03-08 DIAGNOSIS — M549 Dorsalgia, unspecified: Secondary | ICD-10-CM | POA: Diagnosis not present

## 2017-03-08 DIAGNOSIS — N184 Chronic kidney disease, stage 4 (severe): Secondary | ICD-10-CM | POA: Diagnosis not present

## 2017-03-10 ENCOUNTER — Encounter (HOSPITAL_COMMUNITY): Payer: Medicare Other

## 2017-03-10 ENCOUNTER — Ambulatory Visit (HOSPITAL_COMMUNITY): Payer: Self-pay

## 2017-03-15 ENCOUNTER — Encounter (HOSPITAL_COMMUNITY): Payer: Medicare Other

## 2017-03-15 ENCOUNTER — Ambulatory Visit (HOSPITAL_COMMUNITY): Payer: Self-pay

## 2017-03-22 ENCOUNTER — Other Ambulatory Visit (HOSPITAL_COMMUNITY): Payer: Self-pay | Admitting: *Deleted

## 2017-03-22 ENCOUNTER — Ambulatory Visit (HOSPITAL_COMMUNITY): Payer: Self-pay

## 2017-03-22 MED ORDER — TORSEMIDE 100 MG PO TABS: 100.0000 mg | ORAL_TABLET | Freq: Two times a day (BID) | ORAL | 5 refills | Status: DC

## 2017-03-24 ENCOUNTER — Ambulatory Visit (HOSPITAL_COMMUNITY): Payer: Self-pay

## 2017-03-29 ENCOUNTER — Ambulatory Visit (HOSPITAL_COMMUNITY): Payer: Self-pay

## 2017-03-31 ENCOUNTER — Ambulatory Visit (HOSPITAL_COMMUNITY): Payer: Self-pay

## 2017-04-05 ENCOUNTER — Ambulatory Visit (HOSPITAL_COMMUNITY): Payer: Self-pay

## 2017-04-07 ENCOUNTER — Ambulatory Visit (HOSPITAL_COMMUNITY): Payer: Self-pay

## 2017-04-12 ENCOUNTER — Ambulatory Visit (HOSPITAL_COMMUNITY): Payer: Self-pay

## 2017-04-12 ENCOUNTER — Encounter (HOSPITAL_COMMUNITY): Payer: Self-pay

## 2017-04-12 DIAGNOSIS — I272 Pulmonary hypertension, unspecified: Secondary | ICD-10-CM

## 2017-04-12 DIAGNOSIS — E118 Type 2 diabetes mellitus with unspecified complications: Secondary | ICD-10-CM | POA: Diagnosis not present

## 2017-04-12 NOTE — Progress Notes (Signed)
Discharge Progress Report  Patient Details  Name: Grace Bradford MRN: [id number] Date of Birth: [birth year] Referring Provider:     Pulmonary Rehab Walk Test from 11/23/2016 in [place]  Referring Provider  Dr. Haroldine Laws       Number of Visits: 8  Reason for Discharge:  Early Exit:  patient states she is not physically able to attend  Smoking History:  Social History   Tobacco Use  Smoking Status Never Smoker  Smokeless Tobacco Never Used    Diagnosis:  Pulmonary hypertension ([place])  ADL [facility]:   Initial Exercise Prescription:   Discharge Exercise Prescription (Final Exercise Prescription Changes):   Functional Capacity:   Psychological, QOL, Others - Outcomes: PHQ 2/9: Depression screen PHQ 2/9 10/15/2016  Decreased Interest 0  Down, Depressed, Hopeless 0  PHQ - 2 Score 0    Quality of Life:   Personal Goals: Goals established at orientation with interventions provided to work toward goal.    Personal Goals Discharge: Goals and Risk Factor Review    Row Name 02/14/17 0808             Core Components/Risk Factors/Patient Goals Review   Personal Goals Review  Weight Management/Obesity;Develop more efficient breathing techniques such as purse lipped breathing and diaphragmatic breathing and practicing self-pacing with activity.;Improve shortness of breath with ADL's;Diabetes;Heart Failure       Review  patient has not made any progress towards pulmonary rehab goals. Patients attendance is very inconsistant. She has not attended a session since 10/4.  Will discuss with patient importance of consistant attendance in order to see progress towards goals.       Expected Outcomes  see admission outcomes          Exercise Goals and Review:   Nutrition & Weight - Outcomes:    Nutrition:   Nutrition Discharge:   Education Questionnaire Score:   Pulmonary rehab goals unmet

## 2017-04-14 ENCOUNTER — Ambulatory Visit (HOSPITAL_COMMUNITY): Payer: Self-pay

## 2017-04-21 ENCOUNTER — Ambulatory Visit (HOSPITAL_COMMUNITY): Payer: Self-pay

## 2017-04-28 ENCOUNTER — Ambulatory Visit (HOSPITAL_COMMUNITY): Payer: Self-pay

## 2017-05-03 ENCOUNTER — Ambulatory Visit (HOSPITAL_COMMUNITY): Payer: Self-pay

## 2017-05-05 ENCOUNTER — Ambulatory Visit (HOSPITAL_COMMUNITY): Payer: Self-pay

## 2017-05-05 DIAGNOSIS — G4730 Sleep apnea, unspecified: Secondary | ICD-10-CM | POA: Diagnosis not present

## 2017-05-05 DIAGNOSIS — N2581 Secondary hyperparathyroidism of renal origin: Secondary | ICD-10-CM | POA: Diagnosis not present

## 2017-05-05 DIAGNOSIS — Z6841 Body Mass Index (BMI) 40.0 and over, adult: Secondary | ICD-10-CM | POA: Diagnosis not present

## 2017-05-05 DIAGNOSIS — I2721 Secondary pulmonary arterial hypertension: Secondary | ICD-10-CM | POA: Diagnosis not present

## 2017-05-05 DIAGNOSIS — I129 Hypertensive chronic kidney disease with stage 1 through stage 4 chronic kidney disease, or unspecified chronic kidney disease: Secondary | ICD-10-CM | POA: Diagnosis not present

## 2017-05-05 DIAGNOSIS — N183 Chronic kidney disease, stage 3 (moderate): Secondary | ICD-10-CM | POA: Diagnosis not present

## 2017-05-05 DIAGNOSIS — E8770 Fluid overload, unspecified: Secondary | ICD-10-CM | POA: Diagnosis not present

## 2017-05-05 DIAGNOSIS — J189 Pneumonia, unspecified organism: Secondary | ICD-10-CM | POA: Diagnosis not present

## 2017-05-06 ENCOUNTER — Other Ambulatory Visit (HOSPITAL_COMMUNITY): Payer: Self-pay | Admitting: Internal Medicine

## 2017-05-09 ENCOUNTER — Other Ambulatory Visit (HOSPITAL_COMMUNITY): Payer: Self-pay | Admitting: *Deleted

## 2017-05-09 MED ORDER — METOLAZONE 2.5 MG PO TABS: ORAL_TABLET | ORAL | 3 refills | Status: DC

## 2017-05-10 ENCOUNTER — Ambulatory Visit (HOSPITAL_COMMUNITY): Payer: Self-pay

## 2017-05-12 ENCOUNTER — Ambulatory Visit (HOSPITAL_COMMUNITY): Payer: Self-pay

## 2017-05-17 ENCOUNTER — Ambulatory Visit (HOSPITAL_COMMUNITY): Payer: Self-pay

## 2017-05-19 ENCOUNTER — Ambulatory Visit (HOSPITAL_COMMUNITY): Payer: Self-pay

## 2017-05-24 ENCOUNTER — Ambulatory Visit (HOSPITAL_COMMUNITY): Payer: Self-pay

## 2017-05-24 DIAGNOSIS — I10 Essential (primary) hypertension: Secondary | ICD-10-CM | POA: Diagnosis not present

## 2017-05-24 DIAGNOSIS — E039 Hypothyroidism, unspecified: Secondary | ICD-10-CM | POA: Diagnosis not present

## 2017-05-24 DIAGNOSIS — Z8673 Personal history of transient ischemic attack (TIA), and cerebral infarction without residual deficits: Secondary | ICD-10-CM | POA: Diagnosis not present

## 2017-05-24 DIAGNOSIS — G4733 Obstructive sleep apnea (adult) (pediatric): Secondary | ICD-10-CM | POA: Diagnosis not present

## 2017-05-24 DIAGNOSIS — N184 Chronic kidney disease, stage 4 (severe): Secondary | ICD-10-CM | POA: Diagnosis not present

## 2017-05-24 DIAGNOSIS — E7800 Pure hypercholesterolemia, unspecified: Secondary | ICD-10-CM | POA: Diagnosis not present

## 2017-05-24 DIAGNOSIS — E118 Type 2 diabetes mellitus with unspecified complications: Secondary | ICD-10-CM | POA: Diagnosis not present

## 2017-05-24 DIAGNOSIS — I509 Heart failure, unspecified: Secondary | ICD-10-CM | POA: Diagnosis not present

## 2017-05-26 ENCOUNTER — Ambulatory Visit (HOSPITAL_COMMUNITY): Payer: Self-pay

## 2017-05-31 ENCOUNTER — Ambulatory Visit (HOSPITAL_COMMUNITY): Payer: Self-pay

## 2017-06-01 IMAGING — DX DG CHEST 2V
2 series · 2 of 2 positions shown · non-contrast
Comparison: Radiograph November 15, 2015.

CLINICAL DATA: Shortness of breath, wheezing.

EXAM:
CHEST  2 VIEW

[w chest pa]
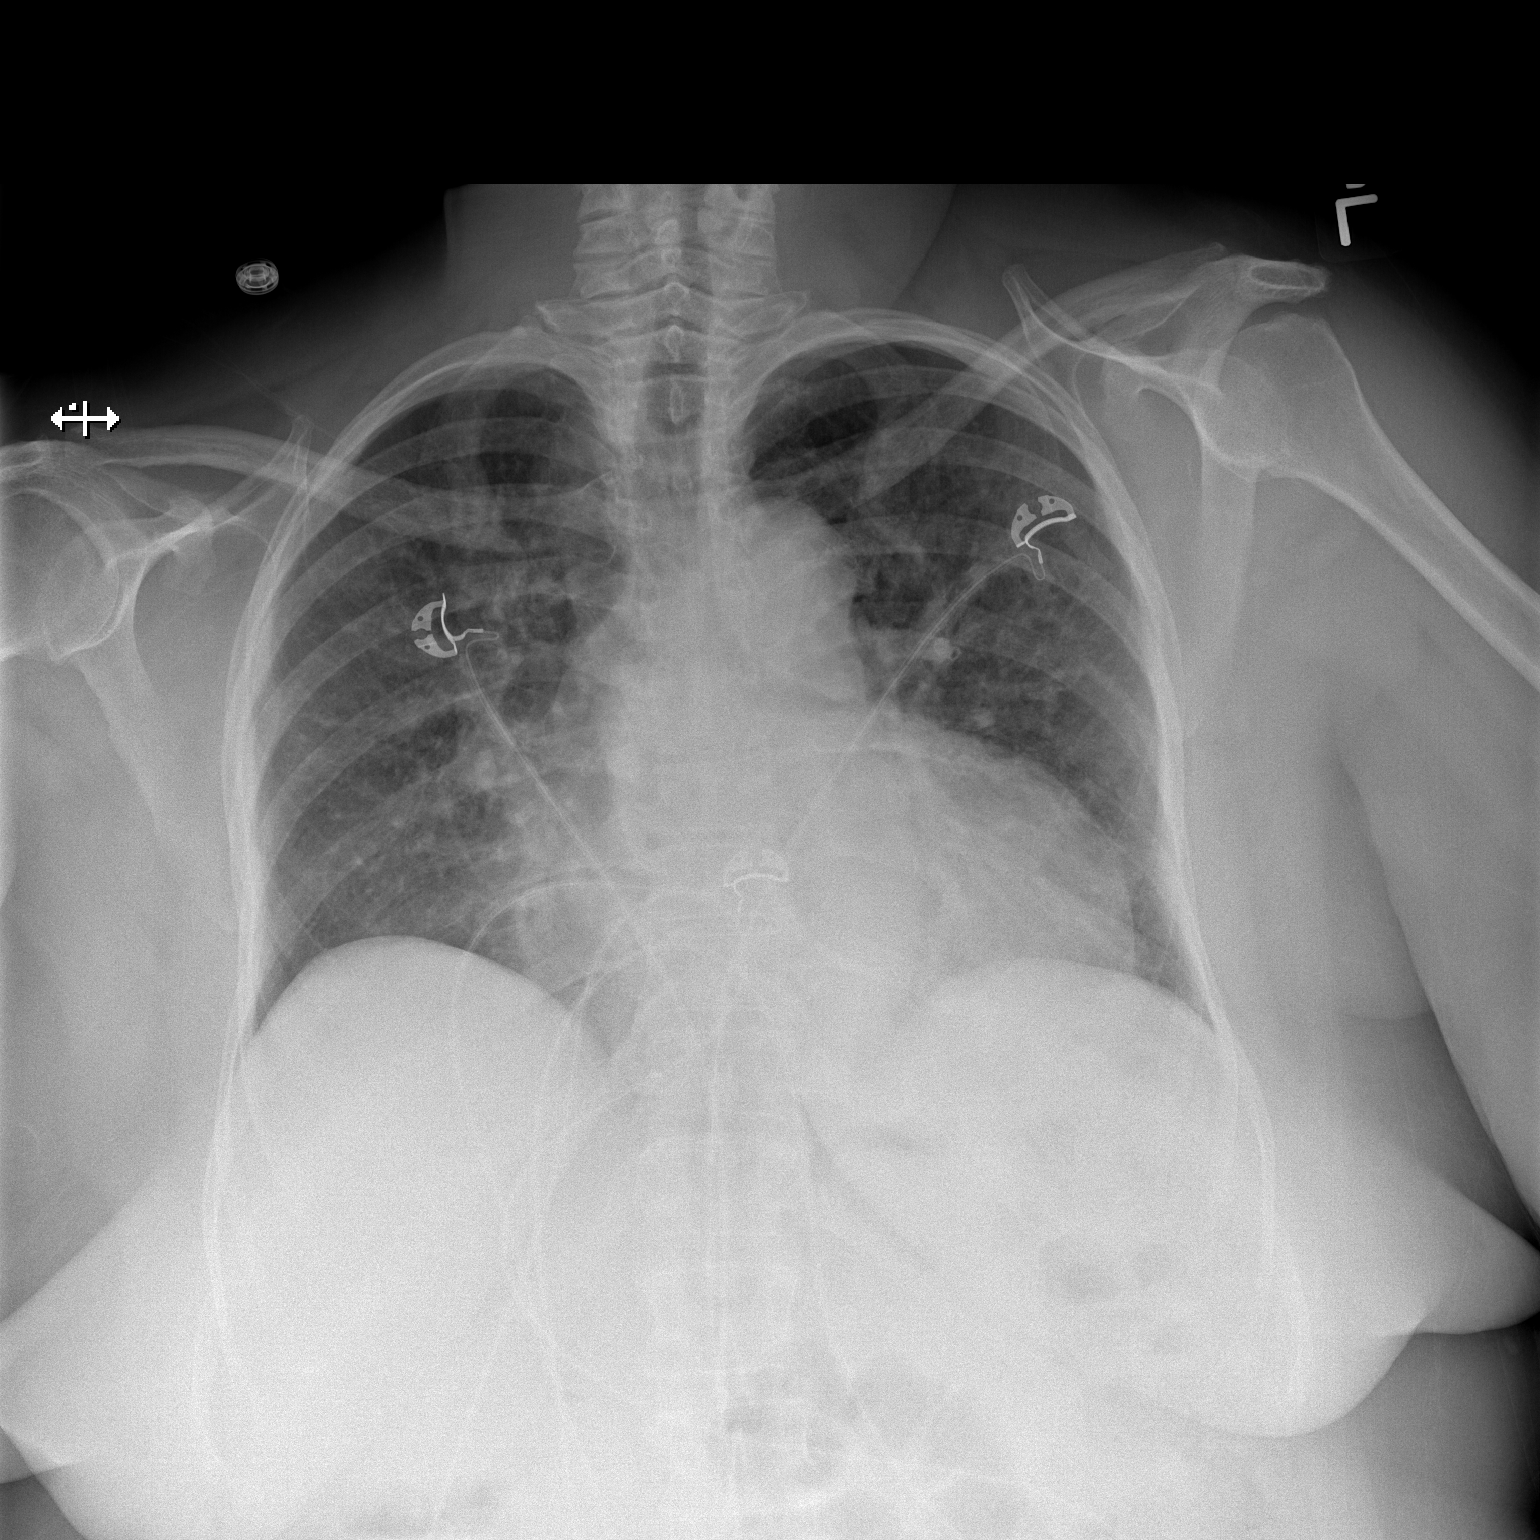

[w chest lat]
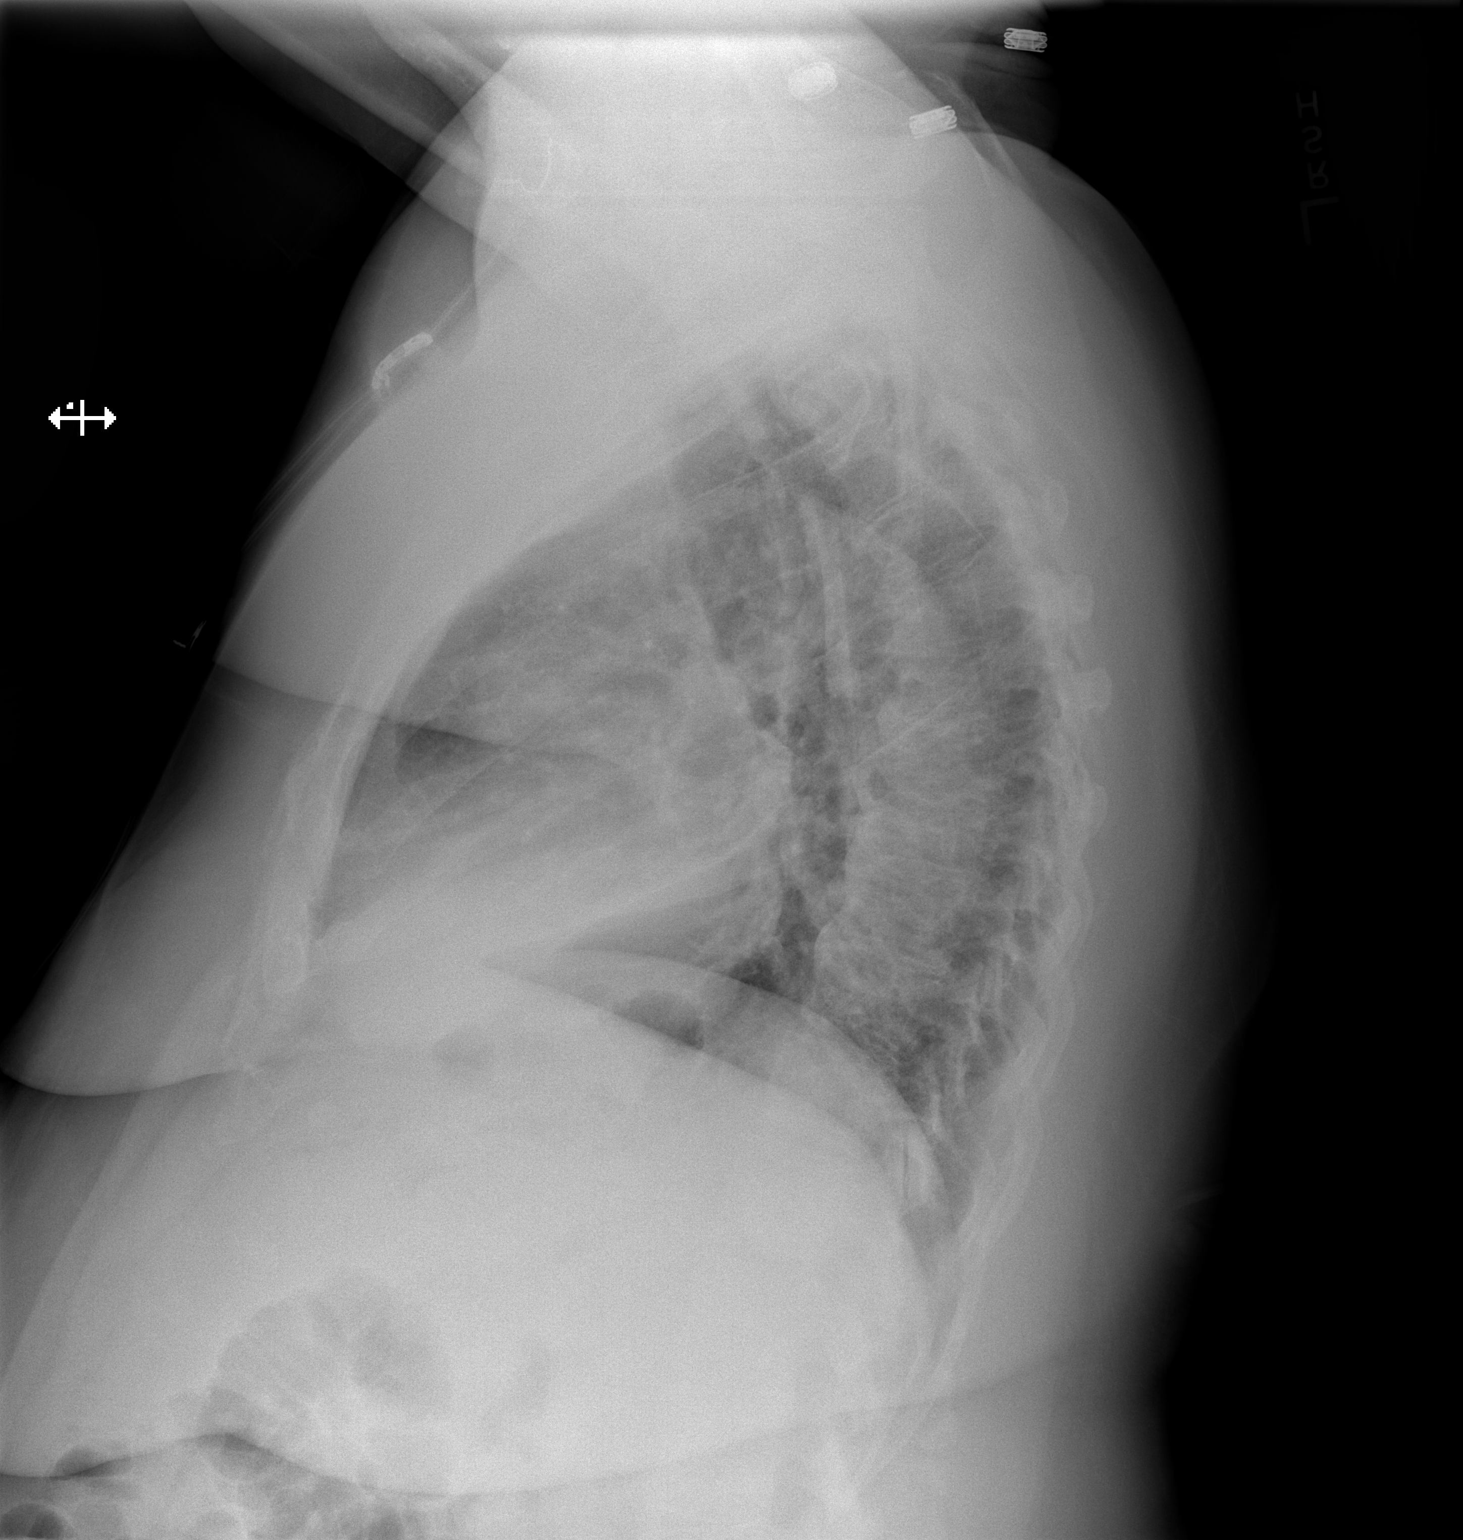

[2 of 2 positions shown; findings below may reference images not displayed]

FINDINGS: Stable cardiomegaly with central pulmonary vascular congestion. No
pneumothorax or pleural effusion is noted. Mild perihilar
interstitial densities are noted which may represent edema. Bony
thorax is unremarkable.
IMPRESSION: Stable cardiomegaly with central pulmonary vascular congestion and
possible mild perihilar edema.

## 2017-06-02 ENCOUNTER — Ambulatory Visit (HOSPITAL_COMMUNITY): Payer: Self-pay

## 2017-06-07 ENCOUNTER — Ambulatory Visit (HOSPITAL_COMMUNITY): Payer: Self-pay

## 2017-06-09 ENCOUNTER — Ambulatory Visit (HOSPITAL_COMMUNITY): Payer: Self-pay

## 2017-06-10 ENCOUNTER — Other Ambulatory Visit (HOSPITAL_COMMUNITY): Payer: Self-pay | Admitting: Internal Medicine

## 2017-06-14 ENCOUNTER — Ambulatory Visit (HOSPITAL_COMMUNITY): Payer: Self-pay

## 2017-06-16 ENCOUNTER — Ambulatory Visit (HOSPITAL_COMMUNITY): Payer: Self-pay

## 2017-06-21 ENCOUNTER — Ambulatory Visit (HOSPITAL_COMMUNITY): Payer: Self-pay

## 2017-06-23 DIAGNOSIS — E118 Type 2 diabetes mellitus with unspecified complications: Secondary | ICD-10-CM | POA: Diagnosis not present

## 2017-06-23 DIAGNOSIS — M170 Bilateral primary osteoarthritis of knee: Secondary | ICD-10-CM | POA: Diagnosis not present

## 2017-06-23 DIAGNOSIS — I509 Heart failure, unspecified: Secondary | ICD-10-CM | POA: Diagnosis not present

## 2017-06-23 DIAGNOSIS — E789 Disorder of lipoprotein metabolism, unspecified: Secondary | ICD-10-CM | POA: Diagnosis not present

## 2017-06-23 DIAGNOSIS — N184 Chronic kidney disease, stage 4 (severe): Secondary | ICD-10-CM | POA: Diagnosis not present

## 2017-06-23 DIAGNOSIS — Z0000 Encounter for general adult medical examination without abnormal findings: Secondary | ICD-10-CM | POA: Diagnosis not present

## 2017-06-23 DIAGNOSIS — I10 Essential (primary) hypertension: Secondary | ICD-10-CM | POA: Diagnosis not present

## 2017-06-23 DIAGNOSIS — E039 Hypothyroidism, unspecified: Secondary | ICD-10-CM | POA: Diagnosis not present

## 2017-06-23 DIAGNOSIS — M150 Primary generalized (osteo)arthritis: Secondary | ICD-10-CM | POA: Diagnosis not present

## 2017-06-28 DIAGNOSIS — E039 Hypothyroidism, unspecified: Secondary | ICD-10-CM | POA: Diagnosis not present

## 2017-06-28 DIAGNOSIS — G4733 Obstructive sleep apnea (adult) (pediatric): Secondary | ICD-10-CM | POA: Diagnosis not present

## 2017-06-28 DIAGNOSIS — E7800 Pure hypercholesterolemia, unspecified: Secondary | ICD-10-CM | POA: Diagnosis not present

## 2017-06-28 DIAGNOSIS — I509 Heart failure, unspecified: Secondary | ICD-10-CM | POA: Diagnosis not present

## 2017-06-28 DIAGNOSIS — Z8673 Personal history of transient ischemic attack (TIA), and cerebral infarction without residual deficits: Secondary | ICD-10-CM | POA: Diagnosis not present

## 2017-06-28 DIAGNOSIS — N184 Chronic kidney disease, stage 4 (severe): Secondary | ICD-10-CM | POA: Diagnosis not present

## 2017-06-28 DIAGNOSIS — I10 Essential (primary) hypertension: Secondary | ICD-10-CM | POA: Diagnosis not present

## 2017-06-28 DIAGNOSIS — E118 Type 2 diabetes mellitus with unspecified complications: Secondary | ICD-10-CM | POA: Diagnosis not present

## 2017-06-30 DIAGNOSIS — Z0000 Encounter for general adult medical examination without abnormal findings: Secondary | ICD-10-CM | POA: Diagnosis not present

## 2017-06-30 DIAGNOSIS — G8929 Other chronic pain: Secondary | ICD-10-CM | POA: Diagnosis not present

## 2017-06-30 DIAGNOSIS — M545 Low back pain: Secondary | ICD-10-CM | POA: Diagnosis not present

## 2017-07-08 ENCOUNTER — Other Ambulatory Visit (HOSPITAL_COMMUNITY): Payer: Self-pay | Admitting: Internal Medicine

## 2017-07-12 ENCOUNTER — Other Ambulatory Visit (HOSPITAL_COMMUNITY): Payer: Self-pay | Admitting: *Deleted

## 2017-07-12 MED ORDER — SACUBITRIL-VALSARTAN 97-103 MG PO TABS: 1.0000 | ORAL_TABLET | Freq: Two times a day (BID) | ORAL | 6 refills | Status: DC

## 2017-07-13 ENCOUNTER — Telehealth (HOSPITAL_COMMUNITY): Payer: Self-pay | Admitting: Vascular Surgery

## 2017-07-13 NOTE — Telephone Encounter (Signed)
Returned pt call to  Make f/u appt w/ db, left pt message giving her 4/25 @ 12:40 w/ db, asked pt to call back to confirm appt

## 2017-07-13 NOTE — Telephone Encounter (Signed)
Left pt message to make f/u appt w/ db next ava

## 2017-07-18 ENCOUNTER — Other Ambulatory Visit: Payer: Self-pay

## 2017-07-18 ENCOUNTER — Emergency Department (HOSPITAL_COMMUNITY): Payer: Medicare Other

## 2017-07-18 ENCOUNTER — Encounter (HOSPITAL_COMMUNITY): Payer: Self-pay | Admitting: Emergency Medicine

## 2017-07-18 ENCOUNTER — Inpatient Hospital Stay (HOSPITAL_COMMUNITY)
Admission: EM | Admit: 2017-07-18 | Discharge: 2017-07-25 | DRG: 193 | Disposition: A | Discharge status: Home / Self Care | Payer: Medicare Other | Attending: Internal Medicine | Admitting: Internal Medicine

## 2017-07-18 DIAGNOSIS — E785 Hyperlipidemia, unspecified: Secondary | ICD-10-CM | POA: Diagnosis present

## 2017-07-18 DIAGNOSIS — E1122 Type 2 diabetes mellitus with diabetic chronic kidney disease: Secondary | ICD-10-CM | POA: Diagnosis present

## 2017-07-18 DIAGNOSIS — I5043 Acute on chronic combined systolic (congestive) and diastolic (congestive) heart failure: Secondary | ICD-10-CM | POA: Diagnosis present

## 2017-07-18 DIAGNOSIS — E119 Type 2 diabetes mellitus without complications: Secondary | ICD-10-CM

## 2017-07-18 DIAGNOSIS — R0602 Shortness of breath: Secondary | ICD-10-CM | POA: Diagnosis not present

## 2017-07-18 DIAGNOSIS — N3281 Overactive bladder: Secondary | ICD-10-CM | POA: Diagnosis present

## 2017-07-18 DIAGNOSIS — Z794 Long term (current) use of insulin: Secondary | ICD-10-CM

## 2017-07-18 DIAGNOSIS — I272 Pulmonary hypertension, unspecified: Secondary | ICD-10-CM | POA: Diagnosis present

## 2017-07-18 DIAGNOSIS — Z7982 Long term (current) use of aspirin: Secondary | ICD-10-CM

## 2017-07-18 DIAGNOSIS — I252 Old myocardial infarction: Secondary | ICD-10-CM | POA: Diagnosis not present

## 2017-07-18 DIAGNOSIS — R05 Cough: Secondary | ICD-10-CM | POA: Diagnosis not present

## 2017-07-18 DIAGNOSIS — N179 Acute kidney failure, unspecified: Secondary | ICD-10-CM | POA: Diagnosis present

## 2017-07-18 DIAGNOSIS — Z8673 Personal history of transient ischemic attack (TIA), and cerebral infarction without residual deficits: Secondary | ICD-10-CM

## 2017-07-18 DIAGNOSIS — Z90710 Acquired absence of both cervix and uterus: Secondary | ICD-10-CM

## 2017-07-18 DIAGNOSIS — Z79890 Hormone replacement therapy (postmenopausal): Secondary | ICD-10-CM | POA: Diagnosis not present

## 2017-07-18 DIAGNOSIS — I130 Hypertensive heart and chronic kidney disease with heart failure and stage 1 through stage 4 chronic kidney disease, or unspecified chronic kidney disease: Secondary | ICD-10-CM | POA: Diagnosis present

## 2017-07-18 DIAGNOSIS — I10 Essential (primary) hypertension: Secondary | ICD-10-CM | POA: Diagnosis present

## 2017-07-18 DIAGNOSIS — J9601 Acute respiratory failure with hypoxia: Secondary | ICD-10-CM | POA: Diagnosis not present

## 2017-07-18 DIAGNOSIS — E1140 Type 2 diabetes mellitus with diabetic neuropathy, unspecified: Secondary | ICD-10-CM | POA: Diagnosis present

## 2017-07-18 DIAGNOSIS — J1000 Influenza due to other identified influenza virus with unspecified type of pneumonia: Principal | ICD-10-CM | POA: Diagnosis present

## 2017-07-18 DIAGNOSIS — E039 Hypothyroidism, unspecified: Secondary | ICD-10-CM | POA: Diagnosis present

## 2017-07-18 DIAGNOSIS — Z8249 Family history of ischemic heart disease and other diseases of the circulatory system: Secondary | ICD-10-CM | POA: Diagnosis not present

## 2017-07-18 DIAGNOSIS — R0902 Hypoxemia: Secondary | ICD-10-CM | POA: Diagnosis not present

## 2017-07-18 DIAGNOSIS — R911 Solitary pulmonary nodule: Secondary | ICD-10-CM | POA: Diagnosis present

## 2017-07-18 DIAGNOSIS — E1142 Type 2 diabetes mellitus with diabetic polyneuropathy: Secondary | ICD-10-CM | POA: Diagnosis present

## 2017-07-18 DIAGNOSIS — Z66 Do not resuscitate: Secondary | ICD-10-CM | POA: Diagnosis present

## 2017-07-18 DIAGNOSIS — Z833 Family history of diabetes mellitus: Secondary | ICD-10-CM | POA: Diagnosis not present

## 2017-07-18 DIAGNOSIS — I509 Heart failure, unspecified: Secondary | ICD-10-CM | POA: Diagnosis not present

## 2017-07-18 DIAGNOSIS — Z7902 Long term (current) use of antithrombotics/antiplatelets: Secondary | ICD-10-CM

## 2017-07-18 DIAGNOSIS — G4733 Obstructive sleep apnea (adult) (pediatric): Secondary | ICD-10-CM | POA: Diagnosis present

## 2017-07-18 DIAGNOSIS — Z79899 Other long term (current) drug therapy: Secondary | ICD-10-CM | POA: Diagnosis not present

## 2017-07-18 DIAGNOSIS — N184 Chronic kidney disease, stage 4 (severe): Secondary | ICD-10-CM | POA: Diagnosis not present

## 2017-07-18 DIAGNOSIS — R509 Fever, unspecified: Secondary | ICD-10-CM | POA: Diagnosis not present

## 2017-07-18 DIAGNOSIS — I429 Cardiomyopathy, unspecified: Secondary | ICD-10-CM | POA: Diagnosis present

## 2017-07-18 DIAGNOSIS — D631 Anemia in chronic kidney disease: Secondary | ICD-10-CM | POA: Diagnosis present

## 2017-07-18 DIAGNOSIS — K219 Gastro-esophageal reflux disease without esophagitis: Secondary | ICD-10-CM | POA: Diagnosis present

## 2017-07-18 DIAGNOSIS — I5022 Chronic systolic (congestive) heart failure: Secondary | ICD-10-CM | POA: Diagnosis present

## 2017-07-18 DIAGNOSIS — M109 Gout, unspecified: Secondary | ICD-10-CM | POA: Diagnosis present

## 2017-07-18 DIAGNOSIS — I2510 Atherosclerotic heart disease of native coronary artery without angina pectoris: Secondary | ICD-10-CM | POA: Diagnosis present

## 2017-07-18 DIAGNOSIS — N183 Chronic kidney disease, stage 3 (moderate): Secondary | ICD-10-CM | POA: Diagnosis not present

## 2017-07-18 DIAGNOSIS — M7989 Other specified soft tissue disorders: Secondary | ICD-10-CM | POA: Diagnosis not present

## 2017-07-18 DIAGNOSIS — G629 Polyneuropathy, unspecified: Secondary | ICD-10-CM

## 2017-07-18 DIAGNOSIS — I219 Acute myocardial infarction, unspecified: Secondary | ICD-10-CM | POA: Diagnosis present

## 2017-07-18 DIAGNOSIS — J111 Influenza due to unidentified influenza virus with other respiratory manifestations: Secondary | ICD-10-CM

## 2017-07-18 LAB — I-STAT VENOUS BLOOD GAS, ED
Acid-Base Excess: 7 mmol/L — ABNORMAL HIGH (ref 0.0–2.0)
Bicarbonate: 34.4 mmol/L — ABNORMAL HIGH (ref 20.0–28.0)
O2 SAT: 90 %
TCO2: 36 mmol/L — ABNORMAL HIGH (ref 22–32)
pCO2, Ven: 63 mmHg — ABNORMAL HIGH (ref 44.0–60.0)
pH, Ven: 7.344 (ref 7.250–7.430)
pO2, Ven: 63 mmHg — ABNORMAL HIGH (ref 32.0–45.0)

## 2017-07-18 LAB — COMPREHENSIVE METABOLIC PANEL
ALT: 13 U/L — AB (ref 14–54)
AST: 18 U/L (ref 15–41)
Albumin: 3.7 g/dL (ref 3.5–5.0)
Alkaline Phosphatase: 61 U/L (ref 38–126)
Anion gap: 15 (ref 5–15)
BILIRUBIN TOTAL: 0.7 mg/dL (ref 0.3–1.2)
BUN: 73 mg/dL — AB (ref 6–20)
CHLORIDE: 97 mmol/L — AB (ref 101–111)
CO2: 28 mmol/L (ref 22–32)
CREATININE: 2.72 mg/dL — AB (ref 0.44–1.00)
Calcium: 8.9 mg/dL (ref 8.9–10.3)
GFR calc Af Amer: 19 mL/min — ABNORMAL LOW (ref >60)
GFR, EST NON AFRICAN AMERICAN: 17 mL/min — AB (ref >60)
Glucose, Bld: 293 mg/dL — ABNORMAL HIGH (ref 65–99)
Potassium: 3.4 mmol/L — ABNORMAL LOW (ref 3.5–5.1)
Sodium: 140 mmol/L (ref 135–145)
TOTAL PROTEIN: 7.6 g/dL (ref 6.5–8.1)

## 2017-07-18 LAB — CBC WITH DIFFERENTIAL/PLATELET
BASOS ABS: 0 10*3/uL (ref 0.0–0.1)
Basophils Relative: 0 %
Eosinophils Absolute: 0 10*3/uL (ref 0.0–0.7)
Eosinophils Relative: 0 %
HEMATOCRIT: 39.1 % (ref 36.0–46.0)
HEMOGLOBIN: 12.4 g/dL (ref 12.0–15.0)
LYMPHS PCT: 3 %
Lymphs Abs: 0.3 10*3/uL — ABNORMAL LOW (ref 0.7–4.0)
MCH: 26.6 pg (ref 26.0–34.0)
MCHC: 31.7 g/dL (ref 30.0–36.0)
MCV: 83.9 fL (ref 78.0–100.0)
Monocytes Absolute: 0.3 10*3/uL (ref 0.1–1.0)
Monocytes Relative: 3 %
NEUTROS ABS: 8.4 10*3/uL — AB (ref 1.7–7.7)
Neutrophils Relative %: 94 %
Platelets: 101 10*3/uL — ABNORMAL LOW (ref 150–400)
RBC: 4.66 MIL/uL (ref 3.87–5.11)
RDW: 16.2 % — ABNORMAL HIGH (ref 11.5–15.5)
WBC: 9 10*3/uL (ref 4.0–10.5)

## 2017-07-18 LAB — GLUCOSE, CAPILLARY
Glucose-Capillary: 327 mg/dL — ABNORMAL HIGH (ref 65–99)
Glucose-Capillary: 228 mg/dL — ABNORMAL HIGH (ref 65–99)

## 2017-07-18 LAB — I-STAT TROPONIN, ED: Troponin i, poc: 0.02 ng/mL (ref 0.00–0.08)

## 2017-07-18 LAB — TROPONIN I
TROPONIN I: 0.04 ng/mL — AB (ref <0.03)
Troponin I: 0.04 ng/mL (ref <0.03)

## 2017-07-18 LAB — HEMOGLOBIN A1C
HEMOGLOBIN A1C: 7.5 % — AB (ref 4.8–5.6)
Mean Plasma Glucose: 168.55 mg/dL

## 2017-07-18 LAB — TSH: TSH: 0.055 u[IU]/mL — ABNORMAL LOW (ref 0.350–4.500)

## 2017-07-18 LAB — BRAIN NATRIURETIC PEPTIDE: B Natriuretic Peptide: 429.6 pg/mL — ABNORMAL HIGH (ref 0.0–100.0)

## 2017-07-18 LAB — INFLUENZA PANEL BY PCR (TYPE A & B)
INFLAPCR: POSITIVE — AB
Influenza B By PCR: NEGATIVE

## 2017-07-18 LAB — I-STAT CG4 LACTIC ACID, ED: LACTIC ACID, VENOUS: 1.11 mmol/L (ref 0.5–1.9)

## 2017-07-18 MED ORDER — ONDANSETRON HCL 4 MG/2ML IJ SOLN: 4.0000 mg | Freq: Four times a day (QID) | INTRAMUSCULAR | Status: DC | PRN

## 2017-07-18 MED ORDER — IPRATROPIUM BROMIDE 0.02 % IN SOLN
0.5000 mg | Freq: Once | RESPIRATORY_TRACT | Status: AC
  Administered 2017-07-18: 0.5 mg via RESPIRATORY_TRACT
  Filled 2017-07-18: qty 2.5

## 2017-07-18 MED ORDER — CLOPIDOGREL BISULFATE 75 MG PO TABS
75.0000 mg | ORAL_TABLET | Freq: Every day | ORAL | Status: DC
  Administered 2017-07-18 – 2017-07-25 (×8): 75 mg via ORAL
  Filled 2017-07-18 (×8): qty 1

## 2017-07-18 MED ORDER — IPRATROPIUM-ALBUTEROL 0.5-2.5 (3) MG/3ML IN SOLN: 3.0000 mL | RESPIRATORY_TRACT | Status: DC | PRN

## 2017-07-18 MED ORDER — CALCITRIOL 0.25 MCG PO CAPS
0.2500 ug | ORAL_CAPSULE | Freq: Every day | ORAL | Status: DC
  Administered 2017-07-18 – 2017-07-25 (×8): 0.25 ug via ORAL
  Filled 2017-07-18 (×8): qty 1

## 2017-07-18 MED ORDER — ALBUTEROL SULFATE (2.5 MG/3ML) 0.083% IN NEBU
5.0000 mg | INHALATION_SOLUTION | Freq: Once | RESPIRATORY_TRACT | Status: AC
  Administered 2017-07-18: 5 mg via RESPIRATORY_TRACT
  Filled 2017-07-18: qty 6

## 2017-07-18 MED ORDER — LEVOTHYROXINE SODIUM 25 MCG PO TABS
125.0000 ug | ORAL_TABLET | Freq: Every day | ORAL | Status: DC
  Administered 2017-07-19 – 2017-07-25 (×7): 125 ug via ORAL
  Filled 2017-07-18 (×7): qty 1

## 2017-07-18 MED ORDER — POTASSIUM CHLORIDE CRYS ER 20 MEQ PO TBCR
20.0000 meq | EXTENDED_RELEASE_TABLET | Freq: Two times a day (BID) | ORAL | Status: DC
  Administered 2017-07-18: 20 meq via ORAL
  Filled 2017-07-18: qty 1

## 2017-07-18 MED ORDER — FEBUXOSTAT 40 MG PO TABS
40.0000 mg | ORAL_TABLET | Freq: Every day | ORAL | Status: DC
  Administered 2017-07-18 – 2017-07-25 (×8): 40 mg via ORAL
  Filled 2017-07-18 (×8): qty 1

## 2017-07-18 MED ORDER — PROBIOTIC PO CAPS: ORAL_CAPSULE | Freq: Every day | ORAL | Status: DC

## 2017-07-18 MED ORDER — HYDROXYZINE HCL 25 MG PO TABS: 25.0000 mg | ORAL_TABLET | Freq: Four times a day (QID) | ORAL | Status: DC | PRN

## 2017-07-18 MED ORDER — OSELTAMIVIR PHOSPHATE 30 MG PO CAPS
30.0000 mg | ORAL_CAPSULE | Freq: Every day | ORAL | Status: AC
  Administered 2017-07-18 – 2017-07-22 (×5): 30 mg via ORAL
  Filled 2017-07-18 (×5): qty 1

## 2017-07-18 MED ORDER — INSULIN ASPART 100 UNIT/ML ~~LOC~~ SOLN
0.0000 [IU] | Freq: Three times a day (TID) | SUBCUTANEOUS | Status: DC
  Administered 2017-07-18: 7 [IU] via SUBCUTANEOUS
  Administered 2017-07-19 (×2): 1 [IU] via SUBCUTANEOUS
  Administered 2017-07-20: 3 [IU] via SUBCUTANEOUS
  Administered 2017-07-20: 9 [IU] via SUBCUTANEOUS
  Administered 2017-07-20: 3 [IU] via SUBCUTANEOUS
  Administered 2017-07-21: 9 [IU] via SUBCUTANEOUS
  Administered 2017-07-21: 3 [IU] via SUBCUTANEOUS
  Administered 2017-07-21: 2 [IU] via SUBCUTANEOUS
  Administered 2017-07-22 (×2): 1 [IU] via SUBCUTANEOUS
  Administered 2017-07-23 – 2017-07-24 (×4): 3 [IU] via SUBCUTANEOUS
  Administered 2017-07-24: 1 [IU] via SUBCUTANEOUS
  Administered 2017-07-24: 2 [IU] via SUBCUTANEOUS
  Administered 2017-07-25: 5 [IU] via SUBCUTANEOUS
  Administered 2017-07-25: 1 [IU] via SUBCUTANEOUS

## 2017-07-18 MED ORDER — RISAQUAD PO CAPS
1.0000 | ORAL_CAPSULE | Freq: Every day | ORAL | Status: DC
  Administered 2017-07-18 – 2017-07-25 (×8): 1 via ORAL
  Filled 2017-07-18 (×8): qty 1

## 2017-07-18 MED ORDER — INSULIN DETEMIR 100 UNIT/ML ~~LOC~~ SOLN
20.0000 [IU] | Freq: Two times a day (BID) | SUBCUTANEOUS | Status: DC
  Administered 2017-07-18 (×2): 20 [IU] via SUBCUTANEOUS
  Filled 2017-07-18 (×4): qty 0.2

## 2017-07-18 MED ORDER — HEPARIN SODIUM (PORCINE) 5000 UNIT/ML IJ SOLN
5000.0000 [IU] | Freq: Three times a day (TID) | INTRAMUSCULAR | Status: DC
  Administered 2017-07-18 – 2017-07-25 (×21): 5000 [IU] via SUBCUTANEOUS
  Filled 2017-07-18 (×21): qty 1

## 2017-07-18 MED ORDER — FUROSEMIDE 10 MG/ML IJ SOLN: 20.0000 mg | Freq: Two times a day (BID) | INTRAMUSCULAR | Status: DC

## 2017-07-18 MED ORDER — IVABRADINE HCL 7.5 MG PO TABS
7.5000 mg | ORAL_TABLET | Freq: Two times a day (BID) | ORAL | Status: DC
  Administered 2017-07-19 – 2017-07-25 (×13): 7.5 mg via ORAL
  Filled 2017-07-18 (×16): qty 1

## 2017-07-18 MED ORDER — FESOTERODINE FUMARATE ER 8 MG PO TB24
8.0000 mg | ORAL_TABLET | Freq: Every day | ORAL | Status: DC
  Administered 2017-07-18 – 2017-07-25 (×8): 8 mg via ORAL
  Filled 2017-07-18 (×8): qty 1

## 2017-07-18 MED ORDER — ASPIRIN EC 81 MG PO TBEC
81.0000 mg | DELAYED_RELEASE_TABLET | Freq: Every day | ORAL | Status: DC
  Administered 2017-07-19 – 2017-07-25 (×7): 81 mg via ORAL
  Filled 2017-07-18 (×7): qty 1

## 2017-07-18 MED ORDER — SILDENAFIL CITRATE 20 MG PO TABS
60.0000 mg | ORAL_TABLET | Freq: Three times a day (TID) | ORAL | Status: DC
  Administered 2017-07-18 – 2017-07-25 (×20): 60 mg via ORAL
  Filled 2017-07-18 (×24): qty 3

## 2017-07-18 MED ORDER — ALBUTEROL SULFATE (2.5 MG/3ML) 0.083% IN NEBU: 2.5000 mg | INHALATION_SOLUTION | RESPIRATORY_TRACT | Status: DC | PRN

## 2017-07-18 MED ORDER — PRAVASTATIN SODIUM 40 MG PO TABS
80.0000 mg | ORAL_TABLET | Freq: Every day | ORAL | Status: DC
  Administered 2017-07-18 – 2017-07-24 (×7): 80 mg via ORAL
  Filled 2017-07-18 (×7): qty 2

## 2017-07-18 MED ORDER — CARVEDILOL 6.25 MG PO TABS: 9.3750 mg | ORAL_TABLET | Freq: Two times a day (BID) | ORAL | Status: DC

## 2017-07-18 MED ORDER — PANTOPRAZOLE SODIUM 40 MG PO TBEC
40.0000 mg | DELAYED_RELEASE_TABLET | Freq: Every day | ORAL | Status: DC
  Administered 2017-07-19 – 2017-07-25 (×7): 40 mg via ORAL
  Filled 2017-07-18 (×7): qty 1

## 2017-07-18 MED ORDER — SODIUM CHLORIDE 0.9 % IV SOLN
250.0000 mL | INTRAVENOUS | Status: DC | PRN
Start: 2017-07-18 — End: 2017-07-25

## 2017-07-18 MED ORDER — FUROSEMIDE 10 MG/ML IJ SOLN
40.0000 mg | Freq: Once | INTRAMUSCULAR | Status: AC
  Administered 2017-07-18: 40 mg via INTRAVENOUS
  Filled 2017-07-18: qty 4

## 2017-07-18 MED ORDER — SODIUM CHLORIDE 0.9% FLUSH
3.0000 mL | Freq: Two times a day (BID) | INTRAVENOUS | Status: DC
  Administered 2017-07-18 – 2017-07-25 (×14): 3 mL via INTRAVENOUS

## 2017-07-18 MED ORDER — GUAIFENESIN ER 600 MG PO TB12
1200.0000 mg | ORAL_TABLET | Freq: Two times a day (BID) | ORAL | Status: DC
  Administered 2017-07-18 – 2017-07-25 (×15): 1200 mg via ORAL
  Filled 2017-07-18 (×15): qty 2

## 2017-07-18 MED ORDER — PREGABALIN 75 MG PO CAPS
75.0000 mg | ORAL_CAPSULE | Freq: Two times a day (BID) | ORAL | Status: DC
  Administered 2017-07-18 – 2017-07-25 (×14): 75 mg via ORAL
  Filled 2017-07-18 (×14): qty 1

## 2017-07-18 MED ORDER — SODIUM CHLORIDE 0.9 % IV BOLUS
500.0000 mL | Freq: Once | INTRAVENOUS | Status: AC
  Administered 2017-07-18: 500 mL via INTRAVENOUS

## 2017-07-18 MED ORDER — LACTATED RINGERS IV SOLN
INTRAVENOUS | Status: DC
  Administered 2017-07-18 – 2017-07-19 (×2): via INTRAVENOUS
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
## 2017-07-18 NOTE — ED Triage Notes (Signed)
BIB EMS from home, pt reports SOB X1 day. Pt given 10 albuterol, 0.5 atrovent, 125 solumedrol en route. Pt noted to have wheezing upon arrival with a productive cough. Sats upper 80's on RA.

## 2017-07-18 NOTE — Care Management Note (Signed)
Case Management Note  Patient Details  Name: Grace Bradford MRN: [id number] Date of Birth: 1945-06-30  Subjective/Objective:               From home w spouse admitted w resp failure/ SOB/ fever. CHF with 10 pound weight gain and leg swelling.  No home oxygen use.     Action/Plan:  CM will continue to follow for DC planning.  Expected Discharge Date:                  Expected Discharge Plan:  Home/Self Care  In-House Referral:     Discharge planning Services  CM Consult  Post Acute Care Choice:    Choice offered to:     DME Arranged:    DME Agency:     HH Arranged:    HH Agency:     Status of Service:  In process, will continue to follow  If discussed at Long Length of Stay Meetings, dates discussed:    Additional Comments:  Carles Collet, RN 07/18/2017, 5:11 PM

## 2017-07-18 NOTE — ED Notes (Signed)
Report given to [facility] on 2W.
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
## 2017-07-19 LAB — BASIC METABOLIC PANEL
ANION GAP: 13 (ref 5–15)
BUN: 75 mg/dL — ABNORMAL HIGH (ref 6–20)
CALCIUM: 8.2 mg/dL — AB (ref 8.9–10.3)
CO2: 30 mmol/L (ref 22–32)
CREATININE: 2.45 mg/dL — AB (ref 0.44–1.00)
Chloride: 97 mmol/L — ABNORMAL LOW (ref 101–111)
GFR, EST AFRICAN AMERICAN: 22 mL/min — AB (ref >60)
GFR, EST NON AFRICAN AMERICAN: 19 mL/min — AB (ref >60)
Glucose, Bld: 157 mg/dL — ABNORMAL HIGH (ref 65–99)
Potassium: 3.7 mmol/L (ref 3.5–5.1)
Sodium: 140 mmol/L (ref 135–145)

## 2017-07-19 LAB — CBC WITH DIFFERENTIAL/PLATELET
BASOS ABS: 0 10*3/uL (ref 0.0–0.1)
BASOS PCT: 0 %
EOS ABS: 0 10*3/uL (ref 0.0–0.7)
Eosinophils Relative: 0 %
HCT: 35.7 % — ABNORMAL LOW (ref 36.0–46.0)
HEMOGLOBIN: 11.1 g/dL — AB (ref 12.0–15.0)
Lymphocytes Relative: 7 %
Lymphs Abs: 0.6 10*3/uL — ABNORMAL LOW (ref 0.7–4.0)
MCH: 25.9 pg — ABNORMAL LOW (ref 26.0–34.0)
MCHC: 31.1 g/dL (ref 30.0–36.0)
MCV: 83.4 fL (ref 78.0–100.0)
MONO ABS: 0.7 10*3/uL (ref 0.1–1.0)
Monocytes Relative: 7 %
NEUTROS ABS: 7.9 10*3/uL — AB (ref 1.7–7.7)
NEUTROS PCT: 86 %
Platelets: 102 10*3/uL — ABNORMAL LOW (ref 150–400)
RBC: 4.28 MIL/uL (ref 3.87–5.11)
RDW: 16.5 % — AB (ref 11.5–15.5)
WBC: 9.2 10*3/uL (ref 4.0–10.5)

## 2017-07-19 LAB — GLUCOSE, CAPILLARY
GLUCOSE-CAPILLARY: 136 mg/dL — AB (ref 65–99)
GLUCOSE-CAPILLARY: 102 mg/dL — AB (ref 65–99)
GLUCOSE-CAPILLARY: 128 mg/dL — AB (ref 65–99)
GLUCOSE-CAPILLARY: 223 mg/dL — AB (ref 65–99)

## 2017-07-19 LAB — T4, FREE: FREE T4: 0.98 ng/dL (ref 0.61–1.12)

## 2017-07-19 LAB — TROPONIN I: TROPONIN I: 0.04 ng/mL — AB (ref <0.03)

## 2017-07-19 MED ORDER — INSULIN DETEMIR 100 UNIT/ML ~~LOC~~ SOLN
20.0000 [IU] | Freq: Every day | SUBCUTANEOUS | Status: DC
  Administered 2017-07-19 – 2017-07-20 (×2): 20 [IU] via SUBCUTANEOUS
  Filled 2017-07-19 (×2): qty 0.2

## 2017-07-19 MED ORDER — MENTHOL 3 MG MT LOZG
1.0000 | LOZENGE | OROMUCOSAL | Status: DC | PRN
  Filled 2017-07-19 (×2): qty 9

## 2017-07-19 MED ORDER — HYDROCOD POLST-CPM POLST ER 10-8 MG/5ML PO SUER
5.0000 mL | Freq: Once | ORAL | Status: AC
  Administered 2017-07-19: 5 mL via ORAL
  Filled 2017-07-19: qty 5

## 2017-07-19 MED ORDER — FUROSEMIDE 10 MG/ML IJ SOLN
60.0000 mg | Freq: Once | INTRAMUSCULAR | Status: AC
  Administered 2017-07-19: 60 mg via INTRAVENOUS
  Filled 2017-07-19: qty 6

## 2017-07-19 MED ORDER — FUROSEMIDE 10 MG/ML IJ SOLN
20.0000 mg | Freq: Once | INTRAMUSCULAR | Status: AC
  Administered 2017-07-19: 20 mg via INTRAVENOUS
  Filled 2017-07-19: qty 2

## 2017-07-19 MED ORDER — LEVOFLOXACIN IN D5W 500 MG/100ML IV SOLN
500.0000 mg | INTRAVENOUS | Status: DC
  Administered 2017-07-19: 500 mg via INTRAVENOUS
  Filled 2017-07-19: qty 100

## 2017-07-19 MED ORDER — METHYLPREDNISOLONE SODIUM SUCC 40 MG IJ SOLR
40.0000 mg | Freq: Two times a day (BID) | INTRAMUSCULAR | Status: DC
  Administered 2017-07-19 – 2017-07-20 (×3): 40 mg via INTRAVENOUS
  Filled 2017-07-19 (×3): qty 1

## 2017-07-19 MED ORDER — IPRATROPIUM-ALBUTEROL 0.5-2.5 (3) MG/3ML IN SOLN
3.0000 mL | Freq: Four times a day (QID) | RESPIRATORY_TRACT | Status: DC
  Administered 2017-07-19 – 2017-07-20 (×4): 3 mL via RESPIRATORY_TRACT
  Filled 2017-07-19 (×4): qty 3

## 2017-07-19 NOTE — Progress Notes (Signed)
Dr. Rudolpho Sevin paged the following at 1100 per patient request:   '[id number]:[place], B. admit as flu + patient. Would like to now be a Full code until she leaves the hospital."

## 2017-07-19 NOTE — Progress Notes (Addendum)
PROGRESS NOTE    [name]  VVO:[id number] DOB: [birth year] DOA: 07/18/2017 PCP: Jani Gravel, MD    Brief Narrative: Grace Bradford is a 72 y.o. female with a Past Medical History of DM, CAD, HTN, CKD, and combined chronic heart failure who presents with SOB.  Fever to 101.4 overnight.  No orthopnea, no LE edema.  She is somnolent and defers most questions to her husband.  She is moderately ill-appearing.  RRR.  Lungs CTA B with minimally increased WOB.  No LE edema.    Assessment & Plan:   Principal Problem:   Acute respiratory failure with hypoxia (HCC) Active Problems:   Cardiomyopathy ([place])   Diabetes mellitus ([place])   Hypertension   MI (myocardial infarction) ([place])   Hx of stroke without residual deficits   Gastroesophageal reflux   Neuropathy   CHF (congestive heart failure) (HCC)   Chronic systolic heart failure (HCC)   Solitary pulmonary nodule   OSA (obstructive sleep apnea)   Pulmonary HTN (HCC)   Acute on chronic combined systolic (congestive) and diastolic (congestive) heart failure (HCC)   Type 2 diabetes mellitus (HCC)   CKD (chronic kidney disease), stage IV (HCC)   Influenza  Acute hypoxic respiratory failure. Influenza A;  Chest X ray; stable cardiomegaly with mild central pulmonary vascular congestion. Hypoinflation of the lungs with with mild subsegmental atelectasis.  Will cover for superimpose PNA. Start Levaquin. Multiple risk factor for PNA>  IV lasix.  Nebulizer treatment Will add solumedrol, patient with wheezing.   Acute on Chronic combine Heart failure exacerbation; CAD Prior weight 231.  Weight today 234. Stop IV fluids. Weight up, cr decreased to 2.4. Will give IV lasix.  Holding entresto due to AKI.  Continue with Revatio.   Acute Renal Failure on CKD;  Cr baseline 2.2--2.3 Stop IV fluids.  IV lasix due to SOB, BL crackles.   History of pulmonary hypertension.  Continue Silfenafil  DM type II On levemir. Reduce to 20 daily.  Follow cbg trend.   Hypothyroidism; continue with synthroid.   HTN; hold entresto.    DVT prophylaxis: Heparin  Code Status: Full code.  Family Communication: husband at bedside.  Disposition Plan: home when respiratory failure improved.    Consultants:   none   Procedures:   none   Antimicrobials: Levaquin.    Subjective: She report SOB, feels congested, wheezing.  Per husband she is more alert.   Objective: Vitals:   07/18/17 1530 07/18/17 1800 07/19/17 0044 07/19/17 0612  BP: (!) 156/78 (!) 147/61 140/69 (!) 150/72  Pulse: 68  74 76  Resp: (!) 22 15 16 18   Temp:  98.7 F (37.1 C) 98.4 F (36.9 C) 98.9 F (37.2 C)  TempSrc:  Oral Oral Oral  SpO2: 100% 100% (!) 85% 100%  Weight:  106.5 kg (234 lb 12.6 oz)  106.4 kg (234 lb 9.1 oz)  Height:  5\' 2"  (1.575 m)  5\' 2"  (1.575 m)    Intake/Output Summary (Last 24 hours) at 07/19/2017 0746 Last data filed at 07/19/2017 0207 Gross per 24 hour  Intake 908.75 ml  Output -  Net 908.75 ml   Filed Weights   07/18/17 0631 07/18/17 1800 07/19/17 0612  Weight: 104.3 kg (230 lb) 106.5 kg (234 lb 12.6 oz) 106.4 kg (234 lb 9.1 oz)    Examination:  General exam: Appears calm and comfortable  Respiratory system: Respiratory effort normal. Bilateral crackles and wheezing.  Cardiovascular system: S1 & S2 heard, RRR. No JVD,  murmurs, rubs, gallops or clicks. No pedal edema. Gastrointestinal system: Abdomen is nondistended, soft and nontender. No organomegaly or masses felt. Normal bowel sounds heard. Central nervous system: Alert and oriented. No focal neurological deficits. Extremities: Symmetric 5 x 5 power. Skin: No rashes, lesions or ulcers Psychiatry: Judgement and insight appear normal. Mood & affect appropriate.     Data Reviewed: I have personally reviewed following labs and imaging studies  CBC: Recent Labs  Lab 07/18/17 0839 07/19/17 0313  WBC 9.0 9.2  NEUTROABS 8.4* 7.9*  HGB 12.4 11.1*  HCT 39.1  35.7*  MCV 83.9 83.4  PLT 101* 035*   Basic Metabolic Panel: Recent Labs  Lab 07/18/17 0839 07/19/17 0313  NA 140 140  K 3.4* 3.7  CL 97* 97*  CO2 28 30  GLUCOSE 293* 157*  BUN 73* 75*  CREATININE 2.72* 2.45*  CALCIUM 8.9 8.2*   GFR: Estimated Creatinine Clearance: 24.5 mL/min (A) (by C-G formula based on SCr of 2.45 mg/dL (H)). Liver Function Tests: Recent Labs  Lab 07/18/17 0839  AST 18  ALT 13*  ALKPHOS 61  BILITOT 0.7  PROT 7.6  ALBUMIN 3.7   No results for input(s): LIPASE, AMYLASE in the last 168 hours. No results for input(s): AMMONIA in the last 168 hours. Coagulation Profile: No results for input(s): INR, PROTIME in the last 168 hours. Cardiac Enzymes: Recent Labs  Lab 07/18/17 1253 07/18/17 1815 07/19/17 0038  TROPONINI 0.04* 0.04* 0.04*   BNP (last 3 results) No results for input(s): PROBNP in the last 8760 hours. HbA1C: Recent Labs    07/18/17 1253  HGBA1C 7.5*   CBG: Recent Labs  Lab 07/18/17 1718 07/18/17 2204 07/19/17 0738  GLUCAP 327* 228* 136*   Lipid Profile: No results for input(s): CHOL, HDL, LDLCALC, TRIG, CHOLHDL, LDLDIRECT in the last 72 hours. Thyroid Function Tests: Recent Labs    07/18/17 1253  TSH 0.055*   Anemia Panel: No results for input(s): VITAMINB12, FOLATE, FERRITIN, TIBC, IRON, RETICCTPCT in the last 72 hours. Sepsis Labs: Recent Labs  Lab 07/18/17 0843  LATICACIDVEN 1.11    No results found for this or any previous visit (from the past 240 hour(s)).       Radiology Studies: Dg Chest 2 View  Result Date: 07/18/2017 CLINICAL DATA:  Shortness of breath. EXAM: CHEST - 2 VIEW COMPARISON:  Radiographs of January 31, 2017. FINDINGS: Stable cardiomegaly is noted with central pulmonary vascular congestion. Hypoinflation of the lungs is noted with mild bibasilar subsegmental atelectasis. Atherosclerosis of thoracic aorta is noted. No pneumothorax or pleural effusion is noted. Bony thorax is unremarkable.  IMPRESSION: Stable cardiomegaly with mild central pulmonary vascular congestion. Hypoinflation of the lungs with mild bibasilar subsegmental atelectasis. Aortic Atherosclerosis (ICD10-I70.0). Electronically Signed   By: Marijo Conception, M.D.   On: 07/18/2017 07:43        Scheduled Meds: . acidophilus  1 capsule Oral Daily  . aspirin EC  81 mg Oral Daily  . calcitRIOL  0.25 mcg Oral Daily  . clopidogrel  75 mg Oral Daily  . febuxostat  40 mg Oral Daily  . fesoterodine  8 mg Oral Daily  . guaiFENesin  1,200 mg Oral BID  . heparin  5,000 Units Subcutaneous Q8H  . insulin aspart  0-9 Units Subcutaneous TID WC  . insulin detemir  20 Units Subcutaneous BID  . ivabradine  7.5 mg Oral BID WC  . levothyroxine  125 mcg Oral QAC breakfast  . oseltamivir  30 mg  Oral Daily  . pantoprazole  40 mg Oral Daily  . potassium chloride SA  20 mEq Oral BID  . pravastatin  80 mg Oral q1800  . pregabalin  75 mg Oral BID  . sildenafil  60 mg Oral TID  . sodium chloride flush  3 mL Intravenous Q12H   Continuous Infusions: . sodium chloride    . lactated ringers 75 mL/hr at 07/19/17 0610     LOS: 1 day    Time spent: 35 minutes    Elmarie Shiley, MD [facility] Pager [phone]  If 7PM-7AM, please contact night-coverage [email or web address] Password [facility] 07/19/2017, 7:46 AM

## 2017-07-19 NOTE — Progress Notes (Signed)
Paged physician requesting cough medicine for pt.  Pt coughed consistently for more than an hour.  A one-time order for tussionex was administered.

## 2017-07-20 LAB — GLUCOSE, CAPILLARY
GLUCOSE-CAPILLARY: 399 mg/dL — AB (ref 65–99)
GLUCOSE-CAPILLARY: 220 mg/dL — AB (ref 65–99)
GLUCOSE-CAPILLARY: 302 mg/dL — AB (ref 65–99)
Glucose-Capillary: 226 mg/dL — ABNORMAL HIGH (ref 65–99)

## 2017-07-20 LAB — BASIC METABOLIC PANEL
ANION GAP: 15 (ref 5–15)
BUN: 77 mg/dL — ABNORMAL HIGH (ref 6–20)
CHLORIDE: 96 mmol/L — AB (ref 101–111)
CO2: 26 mmol/L (ref 22–32)
CREATININE: 2.36 mg/dL — AB (ref 0.44–1.00)
Calcium: 8.3 mg/dL — ABNORMAL LOW (ref 8.9–10.3)
GFR calc Af Amer: 23 mL/min — ABNORMAL LOW (ref >60)
GFR calc non Af Amer: 20 mL/min — ABNORMAL LOW (ref >60)
Glucose, Bld: 231 mg/dL — ABNORMAL HIGH (ref 65–99)
POTASSIUM: 4.1 mmol/L (ref 3.5–5.1)
Sodium: 137 mmol/L (ref 135–145)

## 2017-07-20 LAB — T3, FREE: T3, Free: 1.6 pg/mL — ABNORMAL LOW (ref 2.0–4.4)

## 2017-07-20 LAB — RSV(RESPIRATORY SYNCYTIAL VIRUS) AB, BLOOD: RSV AB: NEGATIVE

## 2017-07-20 MED ORDER — METOLAZONE 2.5 MG PO TABS
2.5000 mg | ORAL_TABLET | Freq: Once | ORAL | Status: AC
  Administered 2017-07-20: 2.5 mg via ORAL
  Filled 2017-07-20: qty 1

## 2017-07-20 MED ORDER — CEFDINIR 300 MG PO CAPS
300.0000 mg | ORAL_CAPSULE | Freq: Every day | ORAL | Status: DC
  Filled 2017-07-20: qty 1

## 2017-07-20 MED ORDER — CEFDINIR 125 MG/5ML PO SUSR
300.0000 mg | Freq: Every day | ORAL | Status: AC
  Administered 2017-07-20 – 2017-07-25 (×6): 300 mg via ORAL
  Filled 2017-07-20 (×6): qty 15

## 2017-07-20 MED ORDER — INSULIN DETEMIR 100 UNIT/ML ~~LOC~~ SOLN
20.0000 [IU] | Freq: Two times a day (BID) | SUBCUTANEOUS | Status: DC
  Administered 2017-07-20 – 2017-07-21 (×2): 20 [IU] via SUBCUTANEOUS
  Filled 2017-07-20 (×2): qty 0.2

## 2017-07-20 MED ORDER — METHYLPREDNISOLONE SODIUM SUCC 40 MG IJ SOLR
40.0000 mg | Freq: Every day | INTRAMUSCULAR | Status: DC
  Administered 2017-07-21: 40 mg via INTRAVENOUS
  Filled 2017-07-20: qty 1

## 2017-07-20 MED ORDER — IPRATROPIUM-ALBUTEROL 0.5-2.5 (3) MG/3ML IN SOLN: 3.0000 mL | RESPIRATORY_TRACT | Status: DC | PRN

## 2017-07-20 MED ORDER — TORSEMIDE 20 MG PO TABS
100.0000 mg | ORAL_TABLET | Freq: Two times a day (BID) | ORAL | Status: DC
  Administered 2017-07-20 – 2017-07-21 (×3): 100 mg via ORAL
  Filled 2017-07-20 (×3): qty 5

## 2017-07-20 NOTE — Progress Notes (Signed)
PT refused CPAP.  

## 2017-07-20 NOTE — Progress Notes (Signed)
Inpatient Diabetes Program Recommendations  AACE/ADA: New Consensus Statement on Inpatient Glycemic Control (2015)  Target Ranges:  Prepandial:   less than 140 mg/dL      Peak postprandial:   less than 180 mg/dL (1-2 hours)      Critically ill patients:  140 - 180 mg/dL   Lab Results  Component Value Date   GLUCAP 399 (H) 07/20/2017   HGBA1C 7.5 (H) 07/18/2017    Review of Glycemic Control Results for Grace Bradford, Grace Bradford (MRN [id number]) as of 07/20/2017 12:54  Ref. Range 07/19/2017 11:51 07/19/2017 16:54 07/19/2017 21:12 07/20/2017 07:27 07/20/2017 11:35  Glucose-Capillary Latest Ref Range: 65 - 99 mg/dL 102 (H) 128 (H) 223 (H) 226 (H) 399 (H)   Diabetes history: DM2 Outpatient Diabetes medications: Levemir 80 units bid Current orders for Inpatient glycemic control: Levemir 20 units + Novolog correction sensitive tid  Inpatient Diabetes Program Recommendations:   Noted hyperglycemia. -Increase Levemir 20 units to bid  Thank you, Nani Gasser. Chloris Marcoux, RN, MSN, CDE  Diabetes Coordinator Inpatient Glycemic Control Team Team Pager [phone] (8am-5pm) 07/20/2017 12:58 PM

## 2017-07-20 NOTE — Progress Notes (Signed)
PROGRESS NOTE    Grace RIBAUDO  UKG:[id number] DOB: February 14, 1946 DOA: 07/18/2017 PCP: Jani Gravel, MD    Brief Narrative: Grace Bradford is a 72 y.o. female with a Past Medical History of DM, CAD, HTN, CKD, and combined chronic heart failure who presents with SOB.  Fever to 101.4 overnight.  No orthopnea, no LE edema.  She is somnolent and defers most questions to her husband.  She is moderately ill-appearing.  RRR.  Lungs CTA B with minimally increased WOB.  No LE edema.    Assessment & Plan:   Principal Problem:   Acute respiratory failure with hypoxia (HCC) Active Problems:   Cardiomyopathy ([place])   Diabetes mellitus ([place])   Hypertension   MI (myocardial infarction) ([place])   Hx of stroke without residual deficits   Gastroesophageal reflux   Neuropathy   CHF (congestive heart failure) (HCC)   Chronic systolic heart failure (HCC)   Solitary pulmonary nodule   OSA (obstructive sleep apnea)   Pulmonary HTN (HCC)   Acute on chronic combined systolic (congestive) and diastolic (congestive) heart failure (HCC)   Type 2 diabetes mellitus (HCC)   CKD (chronic kidney disease), stage IV (HCC)   Influenza  Acute hypoxic respiratory failure. Influenza A;  Chest X ray; stable cardiomegaly with mild central pulmonary vascular congestion. Hypoinflation of the lungs with with mild subsegmental atelectasis.  Will cover for superimpose PNA. Change Levaquin to Omnicef . Multiple risk factor for PNA>  Change lasix to home dose torsemide.  Nebulizer treatment continue with  solumedrol, patient with wheezing.  Report breathing better.   Acute on Chronic combine Heart failure exacerbation; CAD Prior weight 231.  Weight today 234--235 Holding entresto due to AKI.  Continue with Revatio.  Received 80 mg IV lasix on 3-26.  Will resume torsemide. Will give one dose of torsemide.  Cr stable at 2.3  Acute Renal Failure on CKD;  Cr baseline 2.2--2.3 Stable. Monitor on torsemide.    History of  pulmonary hypertension.  Continue Silfenafil  DM type II On levemir. Increase to 20 BID, CBG increasing. Suspect related to steroids.    Hypothyroidism; continue with synthroid. TSH low but Free T 3 low also.   HTN; hold entresto.    DVT prophylaxis: Heparin  Code Status: Full code.  Family Communication: husband at bedside.  Disposition Plan: home when respiratory failure improved.    Consultants:   none   Procedures:   none   Antimicrobials: Levaquin.    Subjective: She is breathing better, feeling less congested,  Cough improved. Less wheezing.    Objective: Vitals:   07/19/17 2055 07/20/17 0000 07/20/17 0110 07/20/17 0500  BP:  (!) 136/54    Pulse:  63    Resp:  18    Temp:  98.3 F (36.8 C)    TempSrc:  Oral    SpO2: 97% 98% 98%   Weight:    106.7 kg (235 lb 3.7 oz)  Height:        Intake/Output Summary (Last 24 hours) at 07/20/2017 0733 Last data filed at 07/20/2017 0500 Gross per 24 hour  Intake 763 ml  Output 1150 ml  Net -387 ml   Filed Weights   07/18/17 1800 07/19/17 0612 07/20/17 0500  Weight: 106.5 kg (234 lb 12.6 oz) 106.4 kg (234 lb 9.1 oz) 106.7 kg (235 lb 3.7 oz)    Examination:  General exam: NAD Respiratory system: Normal respiratory effort, Crackle and wheezing BL.  Cardiovascular system: S 1, S  2 RRR Gastrointestinal system: BS present, soft, nt Central nervous system: alert and oriented.  Extremities: trace edema.  Skin: no rash.  Psychiatry: affect appropriate.   Data Reviewed: I have personally reviewed following labs and imaging studies  CBC: Recent Labs  Lab 07/18/17 0839 07/19/17 0313  WBC 9.0 9.2  NEUTROABS 8.4* 7.9*  HGB 12.4 11.1*  HCT 39.1 35.7*  MCV 83.9 83.4  PLT 101* 277*   Basic Metabolic Panel: Recent Labs  Lab 07/18/17 0839 07/19/17 0313 07/20/17 0217  NA 140 140 137  K 3.4* 3.7 4.1  CL 97* 97* 96*  CO2 28 30 26   GLUCOSE 293* 157* 231*  BUN 73* 75* 77*  CREATININE 2.72* 2.45* 2.36*    CALCIUM 8.9 8.2* 8.3*   GFR: Estimated Creatinine Clearance: 25.5 mL/min (A) (by C-G formula based on SCr of 2.36 mg/dL (H)). Liver Function Tests: Recent Labs  Lab 07/18/17 0839  AST 18  ALT 13*  ALKPHOS 61  BILITOT 0.7  PROT 7.6  ALBUMIN 3.7   No results for input(s): LIPASE, AMYLASE in the last 168 hours. No results for input(s): AMMONIA in the last 168 hours. Coagulation Profile: No results for input(s): INR, PROTIME in the last 168 hours. Cardiac Enzymes: Recent Labs  Lab 07/18/17 1253 07/18/17 1815 07/19/17 0038  TROPONINI 0.04* 0.04* 0.04*   BNP (last 3 results) No results for input(s): PROBNP in the last 8760 hours. HbA1C: Recent Labs    07/18/17 1253  HGBA1C 7.5*   CBG: Recent Labs  Lab 07/19/17 0738 07/19/17 1151 07/19/17 1654 07/19/17 2112 07/20/17 0727  GLUCAP 136* 102* 128* 223* 226*   Lipid Profile: No results for input(s): CHOL, HDL, LDLCALC, TRIG, CHOLHDL, LDLDIRECT in the last 72 hours. Thyroid Function Tests: Recent Labs    07/18/17 1253 07/19/17 0908  TSH 0.055*  --   FREET4  --  0.98  T3FREE  --  1.6*   Anemia Panel: No results for input(s): VITAMINB12, FOLATE, FERRITIN, TIBC, IRON, RETICCTPCT in the last 72 hours. Sepsis Labs: Recent Labs  Lab 07/18/17 0843  LATICACIDVEN 1.11    Recent Results (from the past 240 hour(s))  Blood culture (routine x 2)     Status: None (Preliminary result)   Collection Time: 07/18/17  7:03 AM  Result Value Ref Range Status   Specimen Description BLOOD RIGHT HAND  Final   Special Requests   Final    BOTTLES DRAWN AEROBIC AND ANAEROBIC Blood Culture results may not be optimal due to an inadequate volume of blood received in culture bottles   Culture   Final    NO GROWTH 1 DAY Performed at [facility], [place] [address], [place], [place] [address]    Report Status PENDING  Incomplete  Blood culture (routine x 2)     Status: None (Preliminary result)   Collection Time: 07/18/17  8:35 AM   Result Value Ref Range Status   Specimen Description BLOOD RIGHT FOREARM  Final   Special Requests   Final    BOTTLES DRAWN AEROBIC AND ANAEROBIC Blood Culture adequate volume   Culture   Final    NO GROWTH < 24 HOURS Performed at [facility], [place] [address], [place], [place] [address]    Report Status PENDING  Incomplete         Radiology Studies: No results found.      Scheduled Meds: . acidophilus  1 capsule Oral Daily  . aspirin EC  81 mg Oral  Daily  . calcitRIOL  0.25 mcg Oral Daily  . clopidogrel  75 mg Oral Daily  . febuxostat  40 mg Oral Daily  . fesoterodine  8 mg Oral Daily  . guaiFENesin  1,200 mg Oral BID  . heparin  5,000 Units Subcutaneous Q8H  . insulin aspart  0-9 Units Subcutaneous TID WC  . insulin detemir  20 Units Subcutaneous Daily  . ipratropium-albuterol  3 mL Nebulization Q6H  . ivabradine  7.5 mg Oral BID WC  . levothyroxine  125 mcg Oral QAC breakfast  . methylPREDNISolone (SOLU-MEDROL) injection  40 mg Intravenous Q12H  . oseltamivir  30 mg Oral Daily  . pantoprazole  40 mg Oral Daily  . pravastatin  80 mg Oral q1800  . pregabalin  75 mg Oral BID  . sildenafil  60 mg Oral TID  . sodium chloride flush  3 mL Intravenous Q12H   Continuous Infusions: . sodium chloride    . levofloxacin (LEVAQUIN) IV Stopped (07/19/17 1114)     LOS: 2 days    Time spent: 35 minutes    Elmarie Shiley, MD [facility] Pager [phone]  If 7PM-7AM, please contact night-coverage [email or web address] Password [facility] 07/20/2017, 7:33 AM

## 2017-07-21 LAB — BLOOD GAS, ARTERIAL
ACID-BASE EXCESS: 6.6 mmol/L — AB (ref 0.0–2.0)
Bicarbonate: 31.7 mmol/L — ABNORMAL HIGH (ref 20.0–28.0)
DRAWN BY: 52160
O2 CONTENT: 2 L/min
O2 SAT: 96.5 %
PATIENT TEMPERATURE: 98.6
pCO2 arterial: 56.2 mmHg — ABNORMAL HIGH (ref 32.0–48.0)
pH, Arterial: 7.37 (ref 7.350–7.450)
pO2, Arterial: 87.1 mmHg (ref 83.0–108.0)

## 2017-07-21 LAB — BASIC METABOLIC PANEL
Anion gap: 13 (ref 5–15)
BUN: 82 mg/dL — AB (ref 6–20)
CO2: 28 mmol/L (ref 22–32)
Calcium: 8.5 mg/dL — ABNORMAL LOW (ref 8.9–10.3)
Chloride: 95 mmol/L — ABNORMAL LOW (ref 101–111)
Creatinine, Ser: 2.4 mg/dL — ABNORMAL HIGH (ref 0.44–1.00)
GFR calc Af Amer: 22 mL/min — ABNORMAL LOW (ref >60)
GFR, EST NON AFRICAN AMERICAN: 19 mL/min — AB (ref >60)
GLUCOSE: 348 mg/dL — AB (ref 65–99)
Potassium: 3.7 mmol/L (ref 3.5–5.1)
Sodium: 136 mmol/L (ref 135–145)

## 2017-07-21 LAB — CBC
HEMATOCRIT: 38.3 % (ref 36.0–46.0)
Hemoglobin: 12.1 g/dL (ref 12.0–15.0)
MCH: 26.4 pg (ref 26.0–34.0)
MCHC: 31.6 g/dL (ref 30.0–36.0)
MCV: 83.4 fL (ref 78.0–100.0)
Platelets: 122 10*3/uL — ABNORMAL LOW (ref 150–400)
RBC: 4.59 MIL/uL (ref 3.87–5.11)
RDW: 15.9 % — AB (ref 11.5–15.5)
WBC: 7.1 10*3/uL (ref 4.0–10.5)

## 2017-07-21 LAB — GLUCOSE, CAPILLARY
GLUCOSE-CAPILLARY: 174 mg/dL — AB (ref 65–99)
GLUCOSE-CAPILLARY: 372 mg/dL — AB (ref 65–99)
GLUCOSE-CAPILLARY: 296 mg/dL — AB (ref 65–99)
Glucose-Capillary: 240 mg/dL — ABNORMAL HIGH (ref 65–99)

## 2017-07-21 MED ORDER — INSULIN DETEMIR 100 UNIT/ML ~~LOC~~ SOLN
25.0000 [IU] | Freq: Two times a day (BID) | SUBCUTANEOUS | Status: DC
  Administered 2017-07-21: 25 [IU] via SUBCUTANEOUS
  Filled 2017-07-21 (×2): qty 0.25

## 2017-07-21 MED ORDER — HYDROCOD POLST-CPM POLST ER 10-8 MG/5ML PO SUER
5.0000 mL | Freq: Once | ORAL | Status: AC
  Administered 2017-07-21: 5 mL via ORAL
  Filled 2017-07-21: qty 5

## 2017-07-21 MED ORDER — TORSEMIDE 20 MG PO TABS: 100.0000 mg | ORAL_TABLET | Freq: Every day | ORAL | Status: DC

## 2017-07-21 MED ORDER — INSULIN DETEMIR 100 UNIT/ML ~~LOC~~ SOLN: 30.0000 [IU] | Freq: Two times a day (BID) | SUBCUTANEOUS | Status: DC

## 2017-07-21 MED ORDER — IPRATROPIUM-ALBUTEROL 0.5-2.5 (3) MG/3ML IN SOLN
3.0000 mL | Freq: Four times a day (QID) | RESPIRATORY_TRACT | Status: DC
  Administered 2017-07-21 (×2): 3 mL via RESPIRATORY_TRACT
  Filled 2017-07-21 (×2): qty 3

## 2017-07-21 MED ORDER — GUAIFENESIN-DM 100-10 MG/5ML PO SYRP
5.0000 mL | ORAL_SOLUTION | ORAL | Status: DC | PRN
  Administered 2017-07-21 – 2017-07-25 (×7): 5 mL via ORAL
  Filled 2017-07-21 (×7): qty 5

## 2017-07-21 NOTE — Evaluation (Signed)
Physical Therapy Evaluation Patient Details Name: Grace Bradford MRN: [id number] DOB: 10/03/1945 Today's Date: 07/21/2017   History of Present Illness  Pt is a 72 y/o female admitted secondary to acute respiratory failure with hypoxia. Pt +Flu. PMH including but not limited to DM, CAD, HTN, CKD, and combined chronic heart failure.    Clinical Impression  Pt presented supine in bed with HOB elevated, awake and willing to participate in therapy session. Prior to admission, pt reported that she was independent with all functional mobility and ADLs. Pt currently limited secondary to fatigue. She performed bed mobility at modified independence, transfers with supervision for safety and ambulated within her room without use of an AD with min guard for safety. PT will continue to follow pt acutely to progress mobility as tolerated and to ensure a safe d/c home.    Follow Up Recommendations No PT follow up;Supervision - Intermittent    Equipment Recommendations  None recommended by PT    Recommendations for Other Services       Precautions / Restrictions Restrictions Weight Bearing Restrictions: No      Mobility  Bed Mobility Overal bed mobility: Modified Independent                Transfers Overall transfer level: Needs assistance Equipment used: None Transfers: Sit to/from Stand Sit to Stand: Supervision         General transfer comment: supervision for safety, no UE supports needed  Ambulation/Gait Ambulation/Gait assistance: Min guard Ambulation Distance (Feet): 20 Feet Assistive device: None Gait Pattern/deviations: Step-through pattern;Decreased step length - right;Decreased step length - left;Decreased stride length Gait velocity: decreased Gait velocity interpretation: Below normal speed for age/gender General Gait Details: pt with slow, cautious gait without use of an AD; pt limited to in room ambulation secondary to fatigue  Stairs             Wheelchair Mobility    Modified Rankin (Stroke Patients Only)       Balance Overall balance assessment: Needs assistance Sitting-balance support: Feet supported Sitting balance-Leahy Scale: Good     Standing balance support: During functional activity;No upper extremity supported Standing balance-Leahy Scale: Fair                               Pertinent Vitals/Pain Pain Assessment: No/denies pain    Home Living Family/patient expects to be discharged to:: Private residence Living Arrangements: Spouse/significant other Available Help at Discharge: Family;Available 24 hours/day Type of Home: House Home Access: Stairs to enter Entrance Stairs-Rails: Psychiatric nurse of Steps: 4 Home Layout: Two level Home Equipment: Shower seat;Cane - single point;Walker - 2 wheels;Bedside commode      Prior Function Level of Independence: Independent               Hand Dominance        Extremity/Trunk Assessment   Upper Extremity Assessment Upper Extremity Assessment: Defer to OT evaluation    Lower Extremity Assessment Lower Extremity Assessment: Overall WFL for tasks assessed       Communication   Communication: No difficulties  Cognition Arousal/Alertness: Awake/alert Behavior During Therapy: WFL for tasks assessed/performed Overall Cognitive Status: Within Functional Limits for tasks assessed                                        General Comments  Exercises     Assessment/Plan    PT Assessment Patient needs continued PT services  PT Problem List Decreased activity tolerance;Decreased balance;Decreased mobility;Decreased coordination;Cardiopulmonary status limiting activity       PT Treatment Interventions DME instruction;Gait training;Stair training;Functional mobility training;Therapeutic activities;Therapeutic exercise;Balance training;Neuromuscular re-education;Patient/family education    PT  Goals (Current goals can be found in the Care Plan section)  Acute Rehab PT Goals Patient Stated Goal: return home PT Goal Formulation: With patient/family Time For Goal Achievement: 08/04/17 Potential to Achieve Goals: Good    Frequency Min 3X/week   Barriers to discharge        Co-evaluation               AM-PAC PT "6 Clicks" Daily Activity  Outcome Measure Difficulty turning over in bed (including adjusting bedclothes, sheets and blankets)?: None Difficulty moving from lying on back to sitting on the side of the bed? : None Difficulty sitting down on and standing up from a chair with arms (e.g., wheelchair, bedside commode, etc,.)?: None Help needed moving to and from a bed to chair (including a wheelchair)?: A Little Help needed walking in hospital room?: A Little Help needed climbing 3-5 steps with a railing? : A Little 6 Click Score: 21    End of Session   Activity Tolerance: Patient limited by fatigue Patient left: in bed;with call bell/phone within reach;with family/visitor present;Other (comment)(sitting EOB) Nurse Communication: Mobility status PT Visit Diagnosis: Other abnormalities of gait and mobility (R26.89)    Time: 1601-0932 PT Time Calculation (min) (ACUTE ONLY): 11 min   Charges:   PT Evaluation $PT Eval Low Complexity: 1 Low     PT G Codes:        [place], PT, [place] 07/21/2017, 10:56 AM

## 2017-07-21 NOTE — Progress Notes (Addendum)
PROGRESS NOTE    Grace Bradford  OXB:[id number] DOB: [birth year] DOA: 07/18/2017 PCP: Jani Gravel, MD    Brief Narrative: Grace Bradford is a 72 y.o. female with a Past Medical History of DM, CAD, HTN, CKD, and combined chronic heart failure who presents with SOB.  Fever to 101.4 overnight.  No orthopnea, no LE edema.  She is somnolent and defers most questions to her husband.  She is moderately ill-appearing.  RRR.  Lungs CTA B with minimally increased WOB.  No LE edema.  Assessment & Plan:   Principal Problem:   Acute respiratory failure with hypoxia (HCC) Active Problems:   Cardiomyopathy ([place])   Diabetes mellitus ([place])   Hypertension   MI (myocardial infarction) ([place])   Hx of stroke without residual deficits   Gastroesophageal reflux   Neuropathy   CHF (congestive heart failure) (HCC)   Chronic systolic heart failure (HCC)   Solitary pulmonary nodule   OSA (obstructive sleep apnea)   Pulmonary HTN (HCC)   Acute on chronic combined systolic (congestive) and diastolic (congestive) heart failure (HCC)   Type 2 diabetes mellitus (HCC)   CKD (chronic kidney disease), stage IV (HCC)   Influenza  Acute hypoxic respiratory failure. Influenza A;  Chest X ray; stable cardiomegaly with mild central pulmonary vascular congestion. Hypoinflation of the lungs with with mild subsegmental atelectasis.  Will cover for superimpose PNA. Continue Omnicef . Multiple risk factor for PNA>  Continue with torsemide.  Nebulizer treatment No wheezing, will stop solumedrol.    Acute on Chronic combine Heart failure exacerbation; CAD Prior weight 231.  Weight today 234--235--233 Holding entresto due to AKI.  Continue with Revatio.  Received 80 mg IV lasix on 3-26.  Continue with torsemide. Received one dose of metolazone 3-27. Good urine out put yesterday 3.0 L. Cr 2.4 today. Monitor daily.  She was sleepy, repeated ABG; improved.   Acute Renal Failure on CKD;  Cr baseline 2.2--2.3 Stable.  Monitor on torsemide.    History of pulmonary hypertension.  Continue Silfenafil  DM type II On levemir. Increase levemir to 25 BID>    Hypothyroidism; continue with synthroid. TSH low but Free T 3 low also.   HTN; hold entresto.    DVT prophylaxis: Heparin  Code Status: Full code.  Family Communication: husband at bedside.  Disposition Plan: home soon.    Consultants:   none   Procedures:   none   Antimicrobials: Levaquin.    Subjective: She was coughing a lot last night. She is breathing better. She is sleepy but was up in the morning with PT.    Objective: Vitals:   07/20/17 1704 07/21/17 0001 07/21/17 0437 07/21/17 0730  BP: (!) 142/61 (!) 157/61  (!) 132/46  Pulse: 60 61  (!) 58  Resp: 18 19  18   Temp: 98.5 F (36.9 C) 98.4 F (36.9 C)  97.9 F (36.6 C)  TempSrc: Oral Oral  Axillary  SpO2: 98% 100%  100%  Weight:   106.1 kg (233 lb 14.5 oz)   Height:        Intake/Output Summary (Last 24 hours) at 07/21/2017 1542 Last data filed at 07/21/2017 1442 Gross per 24 hour  Intake 790 ml  Output 3350 ml  Net -2560 ml   Filed Weights   07/19/17 0612 07/20/17 0500 07/21/17 0437  Weight: 106.4 kg (234 lb 9.1 oz) 106.7 kg (235 lb 3.7 oz) 106.1 kg (233 lb 14.5 oz)    Examination:  General exam: NAD Respiratory  system:Normal Respiratory effort, no Wheezing, crackles bases.  Cardiovascular system: S 1, S 2 RRR Gastrointestinal system: BS present, soft, nt Central nervous system:  Sleepy, but wake up and answer questions.  Extremities: no edema,.  Skin: no rash.  Psychiatry: affect appropriate.   Data Reviewed: I have personally reviewed following labs and imaging studies  CBC: Recent Labs  Lab 07/18/17 0839 07/19/17 0313 07/21/17 0234  WBC 9.0 9.2 7.1  NEUTROABS 8.4* 7.9*  --   HGB 12.4 11.1* 12.1  HCT 39.1 35.7* 38.3  MCV 83.9 83.4 83.4  PLT 101* 102* 564*   Basic Metabolic Panel: Recent Labs  Lab 07/18/17 0839 07/19/17 0313  07/20/17 0217 07/21/17 0234  NA 140 140 137 136  K 3.4* 3.7 4.1 3.7  CL 97* 97* 96* 95*  CO2 28 30 26 28   GLUCOSE 293* 157* 231* 348*  BUN 73* 75* 77* 82*  CREATININE 2.72* 2.45* 2.36* 2.40*  CALCIUM 8.9 8.2* 8.3* 8.5*   GFR: Estimated Creatinine Clearance: 25 mL/min (A) (by C-G formula based on SCr of 2.4 mg/dL (H)). Liver Function Tests: Recent Labs  Lab 07/18/17 0839  AST 18  ALT 13*  ALKPHOS 61  BILITOT 0.7  PROT 7.6  ALBUMIN 3.7   No results for input(s): LIPASE, AMYLASE in the last 168 hours. No results for input(s): AMMONIA in the last 168 hours. Coagulation Profile: No results for input(s): INR, PROTIME in the last 168 hours. Cardiac Enzymes: Recent Labs  Lab 07/18/17 1253 07/18/17 1815 07/19/17 0038  TROPONINI 0.04* 0.04* 0.04*   BNP (last 3 results) No results for input(s): PROBNP in the last 8760 hours. HbA1C: No results for input(s): HGBA1C in the last 72 hours. CBG: Recent Labs  Lab 07/20/17 1135 07/20/17 1702 07/20/17 2119 07/21/17 0727 07/21/17 1214  GLUCAP 399* 220* 302* 240* 174*   Lipid Profile: No results for input(s): CHOL, HDL, LDLCALC, TRIG, CHOLHDL, LDLDIRECT in the last 72 hours. Thyroid Function Tests: Recent Labs    07/19/17 0908  FREET4 0.98  T3FREE 1.6*   Anemia Panel: No results for input(s): VITAMINB12, FOLATE, FERRITIN, TIBC, IRON, RETICCTPCT in the last 72 hours. Sepsis Labs: Recent Labs  Lab 07/18/17 0843  LATICACIDVEN 1.11    Recent Results (from the past 240 hour(s))  Blood culture (routine x 2)     Status: None (Preliminary result)   Collection Time: 07/18/17  7:03 AM  Result Value Ref Range Status   Specimen Description BLOOD RIGHT HAND  Final   Special Requests   Final    BOTTLES DRAWN AEROBIC AND ANAEROBIC Blood Culture results may not be optimal due to an inadequate volume of blood received in culture bottles   Culture   Final    NO GROWTH 3 DAYS Performed at [facility], [place] [address]  [place], [place] [address]    Report Status PENDING  Incomplete  Blood culture (routine x 2)     Status: None (Preliminary result)   Collection Time: 07/18/17  8:35 AM  Result Value Ref Range Status   Specimen Description BLOOD RIGHT FOREARM  Final   Special Requests   Final    BOTTLES DRAWN AEROBIC AND ANAEROBIC Blood Culture adequate volume   Culture   Final    NO GROWTH 3 DAYS Performed at [facility], [place] [address] [place], [place] [address]    Report Status PENDING  Incomplete         Radiology Studies: No results found.  Scheduled Meds: . acidophilus  1 capsule Oral Daily  . aspirin EC  81 mg Oral Daily  . calcitRIOL  0.25 mcg Oral Daily  . cefdinir  300 mg Oral Daily  . clopidogrel  75 mg Oral Daily  . febuxostat  40 mg Oral Daily  . fesoterodine  8 mg Oral Daily  . guaiFENesin  1,200 mg Oral BID  . heparin  5,000 Units Subcutaneous Q8H  . insulin aspart  0-9 Units Subcutaneous TID WC  . insulin detemir  25 Units Subcutaneous BID  . ivabradine  7.5 mg Oral BID WC  . levothyroxine  125 mcg Oral QAC breakfast  . oseltamivir  30 mg Oral Daily  . pantoprazole  40 mg Oral Daily  . pravastatin  80 mg Oral q1800  . pregabalin  75 mg Oral BID  . sildenafil  60 mg Oral TID  . sodium chloride flush  3 mL Intravenous Q12H  . torsemide  100 mg Oral BID   Continuous Infusions: . sodium chloride       LOS: 3 days    Time spent: 35 minutes    Elmarie Shiley, MD [facility] Pager [phone]  If 7PM-7AM, please contact night-coverage [email or web address] Password [email or web address] 07/21/2017, 3:42 PM

## 2017-07-21 NOTE — Evaluation (Signed)
Occupational Therapy Evaluation and Discharge Patient Details Name: Grace Bradford MRN: [id number] DOB: December 31, 1945 Today's Date: 07/21/2017    History of Present Illness Pt is a 72 y/o female admitted secondary to acute respiratory failure with hypoxia. Pt +Flu. PMH including but not limited to DM, CAD, HTN, CKD, and combined chronic heart failure.   Clinical Impression   Pt is functioning at a supervision to modified independent level in ADL and ADL transfers. Recommended pt walk to bathroom with assist instead of using BSC and continue maximum participation in ADL with nursing staff. No further OT needs    Follow Up Recommendations  No OT follow up    Equipment Recommendations  None recommended by OT    Recommendations for Other Services       Precautions / Restrictions Precautions Precautions: None Restrictions Weight Bearing Restrictions: No      Mobility Bed Mobility Overal bed mobility: Modified Independent                Transfers Overall transfer level: Modified independent Equipment used: None                  Balance Overall balance assessment: Needs assistance Sitting-balance support: Feet supported Sitting balance-Leahy Scale: Good       Standing balance-Leahy Scale: Fair                             ADL either performed or assessed with clinical judgement   ADL                                         General ADL Comments: supervision for safety and 02 line     Vision Patient Visual Report: No change from baseline       Perception     Praxis      Pertinent Vitals/Pain Pain Assessment: No/denies pain     Hand Dominance Right   Extremity/Trunk Assessment Upper Extremity Assessment Upper Extremity Assessment: Overall WFL for tasks assessed   Lower Extremity Assessment Lower Extremity Assessment: Overall WFL for tasks assessed   Cervical / Trunk Assessment Cervical / Trunk Assessment:  Normal   Communication Communication Communication: No difficulties   Cognition Arousal/Alertness: Awake/alert Behavior During Therapy: WFL for tasks assessed/performed Overall Cognitive Status: Within Functional Limits for tasks assessed                                     General Comments       Exercises     Shoulder Instructions      Home Living Family/patient expects to be discharged to:: Private residence Living Arrangements: Spouse/significant other Available Help at Discharge: Family;Available 24 hours/day Type of Home: House Home Access: Stairs to enter [employer] of Steps: 4 Entrance Stairs-Rails: Right;Left Home Layout: Two level Alternate Level Stairs-Number of Steps: 10 Alternate Level Stairs-Rails: Right;Left Bathroom Shower/Tub: Occupational psychologist: Standard     Home Equipment: Shower seat;Cane - single point;Walker - 2 wheels;Bedside commode          Prior Functioning/Environment Level of Independence: Independent                 OT Problem List: Decreased activity tolerance      OT Treatment/Interventions:  OT Goals(Current goals can be found in the care plan section) Acute Rehab OT Goals Patient Stated Goal: return home  OT Frequency:     Barriers to D/C:            Co-evaluation              AM-PAC PT "6 Clicks" Daily Activity     Outcome Measure Help from another person eating meals?: None Help from another person taking care of personal grooming?: None Help from another person toileting, which includes using toliet, bedpan, or urinal?: None Help from another person bathing (including washing, rinsing, drying)?: None Help from another person to put on and taking off regular upper body clothing?: None Help from another person to put on and taking off regular lower body clothing?: None 6 Click Score: 24   End of Session Equipment Utilized During Treatment:  Oxygen(1L)  Activity Tolerance: Patient tolerated treatment well Patient left: in bed;with call bell/phone within reach;with family/visitor present                   Time: 1117-3567 OT Time Calculation (min): 19 min Charges:  OT General Charges $OT Visit: 1 Visit OT Evaluation $OT Eval Low Complexity: 1 Low G-Codes:     08/02/2017 Nestor Lewandowsky, OTR/L Pager: [place], Haze Boyden 2017/08/02, 1:37 PM

## 2017-07-21 NOTE — Progress Notes (Signed)
PT refused CPAP.  

## 2017-07-22 LAB — URINALYSIS, ROUTINE W REFLEX MICROSCOPIC
Bacteria, UA: NONE SEEN
Bilirubin Urine: NEGATIVE
GLUCOSE, UA: NEGATIVE mg/dL
Hgb urine dipstick: NEGATIVE
Ketones, ur: NEGATIVE mg/dL
NITRITE: NEGATIVE
PH: 5 (ref 5.0–8.0)
PROTEIN: NEGATIVE mg/dL
Specific Gravity, Urine: 1.009 (ref 1.005–1.030)

## 2017-07-22 LAB — BASIC METABOLIC PANEL
Anion gap: 13 (ref 5–15)
BUN: 100 mg/dL — AB (ref 6–20)
CHLORIDE: 93 mmol/L — AB (ref 101–111)
CO2: 29 mmol/L (ref 22–32)
CREATININE: 2.74 mg/dL — AB (ref 0.44–1.00)
Calcium: 8.5 mg/dL — ABNORMAL LOW (ref 8.9–10.3)
GFR calc Af Amer: 19 mL/min — ABNORMAL LOW (ref >60)
GFR calc non Af Amer: 16 mL/min — ABNORMAL LOW (ref >60)
Glucose, Bld: 148 mg/dL — ABNORMAL HIGH (ref 65–99)
POTASSIUM: 3.5 mmol/L (ref 3.5–5.1)
SODIUM: 135 mmol/L (ref 135–145)

## 2017-07-22 LAB — GLUCOSE, CAPILLARY
GLUCOSE-CAPILLARY: 150 mg/dL — AB (ref 65–99)
GLUCOSE-CAPILLARY: 224 mg/dL — AB (ref 65–99)
Glucose-Capillary: 78 mg/dL (ref 65–99)
Glucose-Capillary: 127 mg/dL — ABNORMAL HIGH (ref 65–99)

## 2017-07-22 LAB — SODIUM, URINE, RANDOM: SODIUM UR: 59 mmol/L

## 2017-07-22 MED ORDER — IPRATROPIUM-ALBUTEROL 0.5-2.5 (3) MG/3ML IN SOLN
3.0000 mL | Freq: Three times a day (TID) | RESPIRATORY_TRACT | Status: DC
  Administered 2017-07-22 – 2017-07-23 (×4): 3 mL via RESPIRATORY_TRACT
  Filled 2017-07-22 (×4): qty 3

## 2017-07-22 MED ORDER — INSULIN DETEMIR 100 UNIT/ML ~~LOC~~ SOLN
25.0000 [IU] | Freq: Every day | SUBCUTANEOUS | Status: DC
  Administered 2017-07-22 – 2017-07-24 (×3): 25 [IU] via SUBCUTANEOUS
  Filled 2017-07-22 (×4): qty 0.25

## 2017-07-22 MED ORDER — ACETAMINOPHEN 325 MG PO TABS
650.0000 mg | ORAL_TABLET | Freq: Four times a day (QID) | ORAL | Status: DC | PRN
  Administered 2017-07-22 – 2017-07-25 (×3): 650 mg via ORAL
  Filled 2017-07-22 (×3): qty 2
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
## 2017-07-22 NOTE — Progress Notes (Signed)
PROGRESS NOTE    Grace Bradford  [id number]:[id number] DOB: Mar 01, 1946 DOA: 07/18/2017 PCP: Jani Gravel, MD    Brief Narrative: Grace Bradford is a 72 y.o. female with a Past Medical History of DM, CAD, HTN, CKD, and combined chronic heart failure who presents with SOB.  Fever to 101.4 overnight.  No orthopnea, no LE edema.  She is somnolent and defers most questions to her husband.  She is moderately ill-appearing.  RRR.  Lungs CTA B with minimally increased WOB.  No LE edema.  Assessment & Plan:   Principal Problem:   Acute respiratory failure with hypoxia (HCC) Active Problems:   Cardiomyopathy ([place])   Diabetes mellitus ([place])   Hypertension   MI (myocardial infarction) ([place])   Hx of stroke without residual deficits   Gastroesophageal reflux   Neuropathy   CHF (congestive heart failure) (HCC)   Chronic systolic heart failure (HCC)   Solitary pulmonary nodule   OSA (obstructive sleep apnea)   Pulmonary HTN (HCC)   Acute on chronic combined systolic (congestive) and diastolic (congestive) heart failure (HCC)   Type 2 diabetes mellitus (HCC)   CKD (chronic kidney disease), stage IV (HCC)   Influenza  Acute hypoxic respiratory failure. Influenza A;  Chest X ray; stable cardiomegaly with mild central pulmonary vascular congestion. Hypoinflation of the lungs with with mild subsegmental atelectasis.  Covering for  superimpose PNA. Continue Omnicef . Multiple risk factor for PNA>  Nebulizer treatment No wheezing, stopped  solumedrol.  Hold Torsemide due to worsening kidney function.   Acute on Chronic combine Heart failure exacerbation; CAD Prior weight 231.  Weight today 234--235--233--235 Holding entresto due to AKI.  Continue with Revatio.  Received 80 mg IV lasix on 3-26.   Received one dose of metolazone 3-27. Good urine out put yesterday 3.0  She was sleepy, repeated ABG; improved.  Hold diuretics due to worsening renal function.   Acute Renal Failure on CKD;  Cr baseline  2.2--2.3 Worsening cr and increase BUN. She denies nausea, she is not confused.  Nephrology consulted.   History of pulmonary hypertension.  Continue Silfenafil  DM type II On levemir. Continue with levemir to 25 BID>    Hypothyroidism; continue with synthroid. TSH low but Free T 3 low also.   HTN; hold entresto.    DVT prophylaxis: Heparin  Code Status: Full code.  Family Communication: husband at bedside.  Disposition Plan: home soon.    Consultants:   none   Procedures:   none   Antimicrobials: Levaquin.    Subjective: She report cough persist. Breathing well. She wake up answer some questions.  Follows command.   Objective: Vitals:   07/21/17 1609 07/21/17 2017 07/21/17 2334 07/22/17 0421  BP: (!) 138/55  (!) 164/61   Pulse: 64 (!) 57 (!) 57   Resp: 20 18 18    Temp: 97.6 F (36.4 C)  97.9 F (36.6 C)   TempSrc: Oral  Oral   SpO2: 98% 97% 99%   Weight:    106.7 kg (235 lb 3.7 oz)  Height:        Intake/Output Summary (Last 24 hours) at 07/22/2017 0755 Last data filed at 07/21/2017 2315 Gross per 24 hour  Intake 1280 ml  Output 2700 ml  Net -1420 ml   Filed Weights   07/20/17 0500 07/21/17 0437 07/22/17 0421  Weight: 106.7 kg (235 lb 3.7 oz) 106.1 kg (233 lb 14.5 oz) 106.7 kg (235 lb 3.7 oz)    Examination:  General  exam: NAD Respiratory system; Normal respiratory effort, few crackles, no wheezing.  Cardiovascular system: S 1, S 2 RRR Gastrointestinal system: BS present, soft, nt Central nervous system:  Wake up, answer questions, follows command.  Extremities: No edema Skin: no rash.  Psychiatry: affect appropriate.   Data Reviewed: I have personally reviewed following labs and imaging studies  CBC: Recent Labs  Lab 07/18/17 0839 07/19/17 0313 07/21/17 0234  WBC 9.0 9.2 7.1  NEUTROABS 8.4* 7.9*  --   HGB 12.4 11.1* 12.1  HCT 39.1 35.7* 38.3  MCV 83.9 83.4 83.4  PLT 101* 102* 254*   Basic Metabolic Panel: Recent Labs  Lab  07/18/17 0839 07/19/17 0313 07/20/17 0217 07/21/17 0234 07/22/17 0403  NA 140 140 137 136 135  K 3.4* 3.7 4.1 3.7 3.5  CL 97* 97* 96* 95* 93*  CO2 28 30 26 28 29   GLUCOSE 293* 157* 231* 348* 148*  BUN 73* 75* 77* 82* 100*  CREATININE 2.72* 2.45* 2.36* 2.40* 2.74*  CALCIUM 8.9 8.2* 8.3* 8.5* 8.5*   GFR: Estimated Creatinine Clearance: 21.9 mL/min (A) (by C-G formula based on SCr of 2.74 mg/dL (H)). Liver Function Tests: Recent Labs  Lab 07/18/17 0839  AST 18  ALT 13*  ALKPHOS 61  BILITOT 0.7  PROT 7.6  ALBUMIN 3.7   No results for input(s): LIPASE, AMYLASE in the last 168 hours. No results for input(s): AMMONIA in the last 168 hours. Coagulation Profile: No results for input(s): INR, PROTIME in the last 168 hours. Cardiac Enzymes: Recent Labs  Lab 07/18/17 1253 07/18/17 1815 07/19/17 0038  TROPONINI 0.04* 0.04* 0.04*   BNP (last 3 results) No results for input(s): PROBNP in the last 8760 hours. HbA1C: No results for input(s): HGBA1C in the last 72 hours. CBG: Recent Labs  Lab 07/20/17 2119 07/21/17 0727 07/21/17 1214 07/21/17 1657 07/21/17 2123  GLUCAP 302* 240* 174* 372* 296*   Lipid Profile: No results for input(s): CHOL, HDL, LDLCALC, TRIG, CHOLHDL, LDLDIRECT in the last 72 hours. Thyroid Function Tests: Recent Labs    07/19/17 0908  FREET4 0.98  T3FREE 1.6*   Anemia Panel: No results for input(s): VITAMINB12, FOLATE, FERRITIN, TIBC, IRON, RETICCTPCT in the last 72 hours. Sepsis Labs: Recent Labs  Lab 07/18/17 0843  LATICACIDVEN 1.11    Recent Results (from the past 240 hour(s))  Blood culture (routine x 2)     Status: None (Preliminary result)   Collection Time: 07/18/17  7:03 AM  Result Value Ref Range Status   Specimen Description BLOOD RIGHT HAND  Final   Special Requests   Final    BOTTLES DRAWN AEROBIC AND ANAEROBIC Blood Culture results may not be optimal due to an inadequate volume of blood received in culture bottles   Culture    Final    NO GROWTH 3 DAYS Performed at [facility], [place] [address] [place], [place] [address]    Report Status PENDING  Incomplete  Blood culture (routine x 2)     Status: None (Preliminary result)   Collection Time: 07/18/17  8:35 AM  Result Value Ref Range Status   Specimen Description BLOOD RIGHT FOREARM  Final   Special Requests   Final    BOTTLES DRAWN AEROBIC AND ANAEROBIC Blood Culture adequate volume   Culture   Final    NO GROWTH 3 DAYS Performed at [facility], [place] [address] [place], [place] [address]    Report Status PENDING  Incomplete  Radiology Studies: No results found.      Scheduled Meds: . acidophilus  1 capsule Oral Daily  . aspirin EC  81 mg Oral Daily  . calcitRIOL  0.25 mcg Oral Daily  . cefdinir  300 mg Oral Daily  . clopidogrel  75 mg Oral Daily  . febuxostat  40 mg Oral Daily  . fesoterodine  8 mg Oral Daily  . guaiFENesin  1,200 mg Oral BID  . heparin  5,000 Units Subcutaneous Q8H  . insulin aspart  0-9 Units Subcutaneous TID WC  . insulin detemir  25 Units Subcutaneous BID  . ipratropium-albuterol  3 mL Nebulization TID  . ivabradine  7.5 mg Oral BID WC  . levothyroxine  125 mcg Oral QAC breakfast  . oseltamivir  30 mg Oral Daily  . pantoprazole  40 mg Oral Daily  . pravastatin  80 mg Oral q1800  . pregabalin  75 mg Oral BID  . sildenafil  60 mg Oral TID  . sodium chloride flush  3 mL Intravenous Q12H   Continuous Infusions: . sodium chloride       LOS: 4 days    Time spent: 35 minutes    Elmarie Shiley, MD [facility] Pager [phone]  If 7PM-7AM, please contact night-coverage [email or web address] Password [facility] 07/22/2017, 7:55 AM

## 2017-07-23 LAB — BASIC METABOLIC PANEL
Anion gap: 14 (ref 5–15)
BUN: 108 mg/dL — ABNORMAL HIGH (ref 6–20)
CALCIUM: 8.8 mg/dL — AB (ref 8.9–10.3)
CHLORIDE: 94 mmol/L — AB (ref 101–111)
CO2: 27 mmol/L (ref 22–32)
Creatinine, Ser: 2.83 mg/dL — ABNORMAL HIGH (ref 0.44–1.00)
GFR calc non Af Amer: 16 mL/min — ABNORMAL LOW (ref >60)
GFR, EST AFRICAN AMERICAN: 18 mL/min — AB (ref >60)
GLUCOSE: 208 mg/dL — AB (ref 65–99)
Potassium: 3.7 mmol/L (ref 3.5–5.1)
Sodium: 135 mmol/L (ref 135–145)

## 2017-07-23 LAB — GLUCOSE, CAPILLARY
GLUCOSE-CAPILLARY: 168 mg/dL — AB (ref 65–99)
Glucose-Capillary: 213 mg/dL — ABNORMAL HIGH (ref 65–99)
Glucose-Capillary: 201 mg/dL — ABNORMAL HIGH (ref 65–99)
Glucose-Capillary: 206 mg/dL — ABNORMAL HIGH (ref 65–99)

## 2017-07-23 LAB — CULTURE, BLOOD (ROUTINE X 2)
CULTURE: NO GROWTH
Culture: NO GROWTH
Special Requests: ADEQUATE

## 2017-07-23 LAB — CBC
HEMATOCRIT: 36.3 % (ref 36.0–46.0)
HEMOGLOBIN: 11.7 g/dL — AB (ref 12.0–15.0)
MCH: 26.4 pg (ref 26.0–34.0)
MCHC: 32.2 g/dL (ref 30.0–36.0)
MCV: 81.8 fL (ref 78.0–100.0)
Platelets: 151 10*3/uL (ref 150–400)
RBC: 4.44 MIL/uL (ref 3.87–5.11)
RDW: 15.6 % — AB (ref 11.5–15.5)
WBC: 8.5 10*3/uL (ref 4.0–10.5)

## 2017-07-23 MED ORDER — DOCUSATE SODIUM 50 MG/5ML PO LIQD
200.0000 mg | Freq: Every day | ORAL | Status: DC | PRN
  Administered 2017-07-23: 200 mg via ORAL
  Filled 2017-07-23: qty 20

## 2017-07-23 MED ORDER — ALBUTEROL SULFATE (2.5 MG/3ML) 0.083% IN NEBU
2.5000 mg | INHALATION_SOLUTION | RESPIRATORY_TRACT | Status: DC | PRN
Start: 2017-07-23 — End: 2017-07-25
  Administered 2017-07-24: 2.5 mg via RESPIRATORY_TRACT
  Filled 2017-07-23: qty 3

## 2017-07-23 NOTE — Progress Notes (Signed)
PROGRESS NOTE    Grace Bradford  WUJ:[id number] DOB: Nov 02, 1945 DOA: 07/18/2017 PCP: Jani Gravel, MD    Brief Narrative: Grace Bradford is a 72 y.o. female with a Past Medical History of DM, CAD, HTN, CKD, and combined chronic heart failure who presents with SOB.  Fever to 101.4 overnight.  No orthopnea, no LE edema.  She is somnolent and defers most questions to her husband.  She is moderately ill-appearing.  RRR.  Lungs CTA B with minimally increased WOB.  No LE edema.  Assessment & Plan:   Principal Problem:   Acute respiratory failure with hypoxia (HCC) Active Problems:   Cardiomyopathy ([place])   Diabetes mellitus ([place])   Hypertension   MI (myocardial infarction) ([place])   Hx of stroke without residual deficits   Gastroesophageal reflux   Neuropathy   CHF (congestive heart failure) (HCC)   Chronic systolic heart failure (HCC)   Solitary pulmonary nodule   OSA (obstructive sleep apnea)   Pulmonary HTN (HCC)   Acute on chronic combined systolic (congestive) and diastolic (congestive) heart failure (HCC)   Type 2 diabetes mellitus (HCC)   CKD (chronic kidney disease), stage IV (HCC)   Influenza  Acute hypoxic respiratory failure. Influenza A;  Chest X ray; stable cardiomegaly with mild central pulmonary vascular congestion. Hypoinflation of the lungs with with mild subsegmental atelectasis.  Covering for  superimpose PNA. Continue Omnicef . Multiple risk factor for PNA>  Nebulizer treatment. Finished course of tamiflu  No wheezing, stopped  solumedrol.  Continue to Torsemide due to worsening kidney function.  Improved. On RA  Acute on Chronic combine Heart failure exacerbation; CAD Prior weight 231.  Weight today 234--235--233--235 Holding entresto due to AKI.  Continue with Revatio.  Received 80 mg IV lasix on 3-26.  Received one dose of metolazone 3-27.  Hold diuretics due to worsening renal function.  Compensated.    Acute Renal Failure on CKD;  Cr baseline  2.2--2.3 Worsening cr and increase BUN. She denies nausea, she is not confused.  Nephrology consulted.  Increase cr and BUN. Follow trend. Appreciate nephrology assistance.   History of pulmonary hypertension.  Continue Silfenafil  DM type II On levemir. Continue with levemir to 25 BID>    Hypothyroidism; continue with synthroid. TSH low but Free T 3 low also.   HTN; hold entresto.    DVT prophylaxis: Heparin  Code Status: Full code.  Family Communication: Daughter at bedside.  Disposition Plan: home soon.    Consultants:   none   Procedures:   none   Antimicrobials: Levaquin.    Subjective: She has episodes of coughing spell. She is breathing much better. She was alert eating lunch   Objective: Vitals:   07/23/17 0000 07/23/17 0500 07/23/17 0750 07/23/17 0836  BP: 137/61   (!) 154/75  Pulse: (!) 58   97  Resp: 16   18  Temp: 98 F (36.7 C)   98.9 F (37.2 C)  TempSrc: Oral   Oral  SpO2: 100%  98% 97%  Weight:  107 kg (235 lb 14.3 oz)    Height:        Intake/Output Summary (Last 24 hours) at 07/23/2017 1317 Last data filed at 07/22/2017 2145 Gross per 24 hour  Intake 240 ml  Output 1100 ml  Net -860 ml   Filed Weights   07/21/17 0437 07/22/17 0421 07/23/17 0500  Weight: 106.1 kg (233 lb 14.5 oz) 106.7 kg (235 lb 3.7 oz) 107 kg (235 lb 14.3  oz)    Examination:  General exam: NAD Respiratory system; Normal respiratory effort, CTA Cardiovascular system: S 1, S 2 RRR Gastrointestinal system: BS present, soft, nt Central nervous system:  Alert, non focal.  Extremities; no edema Skin: No rash   Psychiatry: affect appropriate.   Data Reviewed: I have personally reviewed following labs and imaging studies  CBC: Recent Labs  Lab 07/18/17 0839 07/19/17 0313 07/21/17 0234 07/23/17 0404  WBC 9.0 9.2 7.1 8.5  NEUTROABS 8.4* 7.9*  --   --   HGB 12.4 11.1* 12.1 11.7*  HCT 39.1 35.7* 38.3 36.3  MCV 83.9 83.4 83.4 81.8  PLT 101* 102* 122* 834    Basic Metabolic Panel: Recent Labs  Lab 07/19/17 0313 07/20/17 0217 07/21/17 0234 07/22/17 0403 07/23/17 0404  NA 140 137 136 135 135  K 3.7 4.1 3.7 3.5 3.7  CL 97* 96* 95* 93* 94*  CO2 30 26 28 29 27   GLUCOSE 157* 231* 348* 148* 208*  BUN 75* 77* 82* 100* 108*  CREATININE 2.45* 2.36* 2.40* 2.74* 2.83*  CALCIUM 8.2* 8.3* 8.5* 8.5* 8.8*   GFR: Estimated Creatinine Clearance: 21.3 mL/min (A) (by C-G formula based on SCr of 2.83 mg/dL (H)). Liver Function Tests: Recent Labs  Lab 07/18/17 0839  AST 18  ALT 13*  ALKPHOS 61  BILITOT 0.7  PROT 7.6  ALBUMIN 3.7   No results for input(s): LIPASE, AMYLASE in the last 168 hours. No results for input(s): AMMONIA in the last 168 hours. Coagulation Profile: No results for input(s): INR, PROTIME in the last 168 hours. Cardiac Enzymes: Recent Labs  Lab 07/18/17 1253 07/18/17 1815 07/19/17 0038  TROPONINI 0.04* 0.04* 0.04*   BNP (last 3 results) No results for input(s): PROBNP in the last 8760 hours. HbA1C: No results for input(s): HGBA1C in the last 72 hours. CBG: Recent Labs  Lab 07/22/17 1147 07/22/17 1540 07/22/17 2106 07/23/17 0737 07/23/17 1115  GLUCAP 127* 150* 224* 213* 201*   Lipid Profile: No results for input(s): CHOL, HDL, LDLCALC, TRIG, CHOLHDL, LDLDIRECT in the last 72 hours. Thyroid Function Tests: No results for input(s): TSH, T4TOTAL, FREET4, T3FREE, THYROIDAB in the last 72 hours. Anemia Panel: No results for input(s): VITAMINB12, FOLATE, FERRITIN, TIBC, IRON, RETICCTPCT in the last 72 hours. Sepsis Labs: Recent Labs  Lab 07/18/17 0843  LATICACIDVEN 1.11    Recent Results (from the past 240 hour(s))  Blood culture (routine x 2)     Status: None (Preliminary result)   Collection Time: 07/18/17  7:03 AM  Result Value Ref Range Status   Specimen Description BLOOD RIGHT HAND  Final   Special Requests   Final    BOTTLES DRAWN AEROBIC AND ANAEROBIC Blood Culture results may not be optimal due  to an inadequate volume of blood received in culture bottles   Culture   Final    NO GROWTH 4 DAYS Performed at [facility], [place] [address] [place], [place] [address]    Report Status PENDING  Incomplete  Blood culture (routine x 2)     Status: None (Preliminary result)   Collection Time: 07/18/17  8:35 AM  Result Value Ref Range Status   Specimen Description BLOOD RIGHT FOREARM  Final   Special Requests   Final    BOTTLES DRAWN AEROBIC AND ANAEROBIC Blood Culture adequate volume   Culture   Final    NO GROWTH 4 DAYS Performed at [facility], [place] [address], [place],  [address]  Report Status PENDING  Incomplete         Radiology Studies: No results found.      Scheduled Meds: . acidophilus  1 capsule Oral Daily  . aspirin EC  81 mg Oral Daily  . calcitRIOL  0.25 mcg Oral Daily  . cefdinir  300 mg Oral Daily  . clopidogrel  75 mg Oral Daily  . febuxostat  40 mg Oral Daily  . fesoterodine  8 mg Oral Daily  . guaiFENesin  1,200 mg Oral BID  . heparin  5,000 Units Subcutaneous Q8H  . insulin aspart  0-9 Units Subcutaneous TID WC  . insulin detemir  25 Units Subcutaneous QHS  . ivabradine  7.5 mg Oral BID WC  . levothyroxine  125 mcg Oral QAC breakfast  . pantoprazole  40 mg Oral Daily  . pravastatin  80 mg Oral q1800  . pregabalin  75 mg Oral BID  . sildenafil  60 mg Oral TID  . sodium chloride flush  3 mL Intravenous Q12H   Continuous Infusions: . sodium chloride       LOS: 5 days    Time spent: 35 minutes    Elmarie Shiley, MD [facility] Pager [phone]  If 7PM-7AM, please contact night-coverage [email or web address] Password [email or web address] 07/23/2017, 1:17 PM

## 2017-07-23 NOTE — Progress Notes (Signed)
[facility] Progress Note   72 y.o. female with a history of DM, CASHD, HTN, CKD IV (BL cr 4.09) and CHF (systolic and diastolic)  presenting with dyspnea. She had a temp of 101.4 at home with productive sputum and was diagnosed with Influenza A during this hospitalization. She sees Dr. Florene Glen  For her CKD and has not taken any NSAIDs. She also denies any foamy urine, hematuria, dysuria, rashes, nephrolithiasis. Since being hospitalized she was initially treated with Lasix IV 20 -> Demadex 100mg  BID with last dose 3/28 in the AM.    Assessment/ Plan:   1. 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 Florene Glen p/w CHF treated with Lasix and then switched to Demadex at her home regimen dose of 100mg  BID. Her last dose of Demadex was morning of 3/28. - Likely represents fluctuation based on volume status/ perfusion pressures. Renal function initially improved with diureses.  - She was still negative overnight (NET); therefore I would like to hold demadex for one more day. Would like to gently get her positive and determine if renal function improves tomorrow.  - Will also check a u/a and urine Na also. 2. Influence A - treated with Tamiflu 3. CHF - compensated    Subjective:   Feeling better and denies dyspnea; good appetite. No f/c/n/v.   Objective:   BP (!) 154/75 (BP Location: Left Arm)   Pulse 97   Temp 98.9 F (37.2 C) (Oral)   Resp 18   Ht 5\' 2"  (1.575 m)   Wt 107 kg (235 lb 14.3 oz)   SpO2 97%   BMI 43.15 kg/m   Intake/Output Summary (Last 24 hours) at 07/23/2017 1307 Last data filed at 07/22/2017 2145 Gross per 24 hour  Intake 240 ml  Output 1100 ml  Net -860 ml   Weight change: 0.3 kg (10.6 oz)  Physical Exam: General appearance: alert, cooperative and appears stated age Neck: no adenopathy, no carotid bruit, supple, symmetrical, trachea midline and thyroid not enlarged, symmetric, Back: symmetric, no curvature. ROM normal. No CVA  tenderness. Resp: clear to auscultation bilaterally Chest wall: no tenderness Cardio: regular rate and rhythm, S1, S2 normal, no murmur, click, rub or gallop GI: soft, non-tender; bowel sounds normal; no masses,  no organomegaly Extremities: extremities normal, atraumatic, no cyanosis or edema Pulses: 2+ and symmetric Neurologic: Grossly normal    Imaging: No results found.  Labs: BMET Recent Labs  Lab 07/18/17 0839 07/19/17 0313 07/20/17 0217 07/21/17 0234 07/22/17 0403 07/23/17 0404  NA 140 140 137 136 135 135  K 3.4* 3.7 4.1 3.7 3.5 3.7  CL 97* 97* 96* 95* 93* 94*  CO2 28 30 26 28 29 27   GLUCOSE 293* 157* 231* 348* 148* 208*  BUN 73* 75* 77* 82* 100* 108*  CREATININE 2.72* 2.45* 2.36* 2.40* 2.74* 2.83*  CALCIUM 8.9 8.2* 8.3* 8.5* 8.5* 8.8*   CBC Recent Labs  Lab 07/18/17 0839 07/19/17 0313 07/21/17 0234 07/23/17 0404  WBC 9.0 9.2 7.1 8.5  NEUTROABS 8.4* 7.9*  --   --   HGB 12.4 11.1* 12.1 11.7*  HCT 39.1 35.7* 38.3 36.3  MCV 83.9 83.4 83.4 81.8  PLT 101* 102* 122* 151    Medications:    . acidophilus  1 capsule Oral Daily  . aspirin EC  81 mg Oral Daily  . calcitRIOL  0.25 mcg Oral Daily  . cefdinir  300 mg Oral Daily  . clopidogrel  75 mg  Oral Daily  . febuxostat  40 mg Oral Daily  . fesoterodine  8 mg Oral Daily  . guaiFENesin  1,200 mg Oral BID  . heparin  5,000 Units Subcutaneous Q8H  . insulin aspart  0-9 Units Subcutaneous TID WC  . insulin detemir  25 Units Subcutaneous QHS  . ivabradine  7.5 mg Oral BID WC  . levothyroxine  125 mcg Oral QAC breakfast  . pantoprazole  40 mg Oral Daily  . pravastatin  80 mg Oral q1800  . pregabalin  75 mg Oral BID  . sildenafil  60 mg Oral TID  . sodium chloride flush  3 mL Intravenous Q12H      [name], MD 07/23/2017, 1:07 PM

## 2017-07-23 NOTE — Progress Notes (Signed)
07/23/2017 Central monitor called patient had 12 beats of Wide QRS and  SVT. Patient was asymptomatic. Jeannette Corpus  (NP) was made aware. [facility] RN.

## 2017-07-24 LAB — BASIC METABOLIC PANEL
ANION GAP: 12 (ref 5–15)
BUN: 114 mg/dL — ABNORMAL HIGH (ref 6–20)
CALCIUM: 9.1 mg/dL (ref 8.9–10.3)
CO2: 26 mmol/L (ref 22–32)
Chloride: 98 mmol/L — ABNORMAL LOW (ref 101–111)
Creatinine, Ser: 2.52 mg/dL — ABNORMAL HIGH (ref 0.44–1.00)
GFR, EST AFRICAN AMERICAN: 21 mL/min — AB (ref >60)
GFR, EST NON AFRICAN AMERICAN: 18 mL/min — AB (ref >60)
Glucose, Bld: 113 mg/dL — ABNORMAL HIGH (ref 65–99)
Potassium: 3.5 mmol/L (ref 3.5–5.1)
SODIUM: 136 mmol/L (ref 135–145)

## 2017-07-24 LAB — URINALYSIS, ROUTINE W REFLEX MICROSCOPIC
BILIRUBIN URINE: NEGATIVE
Bacteria, UA: NONE SEEN
GLUCOSE, UA: NEGATIVE mg/dL
HGB URINE DIPSTICK: NEGATIVE
Ketones, ur: NEGATIVE mg/dL
NITRITE: NEGATIVE
PH: 6 (ref 5.0–8.0)
Protein, ur: NEGATIVE mg/dL
SPECIFIC GRAVITY, URINE: 1.011 (ref 1.005–1.030)

## 2017-07-24 LAB — GLUCOSE, CAPILLARY
GLUCOSE-CAPILLARY: 126 mg/dL — AB (ref 65–99)
Glucose-Capillary: 167 mg/dL — ABNORMAL HIGH (ref 65–99)
Glucose-Capillary: 237 mg/dL — ABNORMAL HIGH (ref 65–99)
Glucose-Capillary: 261 mg/dL — ABNORMAL HIGH (ref 65–99)

## 2017-07-24 MED ORDER — FUROSEMIDE 10 MG/ML IJ SOLN: 60.0000 mg | Freq: Once | INTRAMUSCULAR | Status: DC

## 2017-07-24 MED ORDER — IPRATROPIUM-ALBUTEROL 0.5-2.5 (3) MG/3ML IN SOLN
3.0000 mL | Freq: Four times a day (QID) | RESPIRATORY_TRACT | Status: DC
  Administered 2017-07-24 (×2): 3 mL via RESPIRATORY_TRACT
  Filled 2017-07-24 (×2): qty 3

## 2017-07-24 MED ORDER — BUDESONIDE 0.25 MG/2ML IN SUSP
0.2500 mg | Freq: Two times a day (BID) | RESPIRATORY_TRACT | Status: DC
  Administered 2017-07-24 – 2017-07-25 (×2): 0.25 mg via RESPIRATORY_TRACT
  Filled 2017-07-24 (×2): qty 2

## 2017-07-24 MED ORDER — FLUTICASONE PROPIONATE 50 MCG/ACT NA SUSP
1.0000 | Freq: Every day | NASAL | Status: DC
  Administered 2017-07-24 – 2017-07-25 (×2): 1 via NASAL
  Filled 2017-07-24: qty 16

## 2017-07-24 MED ORDER — IPRATROPIUM-ALBUTEROL 0.5-2.5 (3) MG/3ML IN SOLN
3.0000 mL | Freq: Two times a day (BID) | RESPIRATORY_TRACT | Status: DC
  Administered 2017-07-25: 3 mL via RESPIRATORY_TRACT
  Filled 2017-07-24: qty 3

## 2017-07-24 NOTE — Progress Notes (Signed)
[facility] Progress Note    72 y.o.femalewith a history of DM, CASHD, HTN, CKD IV (BL cr 8.11) and CHF (systolic and diastolic) presenting with dyspnea. She had a temp of 101.4 at home with productive sputum and was diagnosed with Influenza A during this hospitalization. She sees Dr. Florene Bradford For her CKD and has not taken any NSAIDs. She also denies any foamy urine, hematuria, dysuria, rashes, nephrolithiasis. Since being hospitalized she was initially treated with Lasix IV 20 ->Demadex 100mg  BID with last dose 3/28 in the AM.    Assessment/ Plan:   1. 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 Florene Bradford p/w CHF treated with Lasix and then switched to Demadex at her home regimen dose of 100mg  BID. Her last dose of Demadex was morning of 3/28. - Likely represents fluctuation based on volume status/ perfusion pressures. Renal function initially improved with diuresis --> continue holding diuretics. Urine output has picked up significantly overnight.  - Will also check for eosinphiluria, repeat urinalysis and culture if positive for WBC's. 2. Influence A - treated with Tamiflu 3. CHF - compensated    Subjective:   Feels a little more congested this am.  No f/c/n/v.   Objective:   BP (!) 157/64 (BP Location: Left Arm)   Pulse (!) 58   Temp 98.5 F (36.9 C) (Oral)   Resp 16   Ht 5\' 2"  (1.575 m)   Wt 107.8 kg (237 lb 10.5 oz)   SpO2 97%   BMI 43.47 kg/m   Intake/Output Summary (Last 24 hours) at 07/24/2017 0748 Last data filed at 07/24/2017 0554 Gross per 24 hour  Intake -  Output 2300 ml  Net -2300 ml   Weight change: 0.8 kg (1 lb 12.2 oz)  Physical Exam: General appearance:alert, cooperative and appears stated age Neck:no adenopathy, no carotid bruit, supple, symmetrical, trachea midline and thyroid not enlarged, symmetric, Back:symmetric, no curvature. ROM normal. No CVA tenderness. Resp:clear to auscultation bilaterally Chest wall:no  tenderness Cardio:regular rate and rhythm, S1, S2 normal, no murmur, click, rub or gallop BJ:YNWG, non-tender; bowel sounds normal; no masses, no organomegaly Extremities:tr edema Pulses:2+ and symmetric Neurologic:Grossly normal    Imaging: No results found.  Labs: BMET Recent Labs  Lab 07/18/17 0839 07/19/17 0313 07/20/17 0217 07/21/17 0234 07/22/17 0403 07/23/17 0404  NA 140 140 137 136 135 135  K 3.4* 3.7 4.1 3.7 3.5 3.7  CL 97* 97* 96* 95* 93* 94*  CO2 28 30 26 28 29 27   GLUCOSE 293* 157* 231* 348* 148* 208*  BUN 73* 75* 77* 82* 100* 108*  CREATININE 2.72* 2.45* 2.36* 2.40* 2.74* 2.83*  CALCIUM 8.9 8.2* 8.3* 8.5* 8.5* 8.8*   CBC Recent Labs  Lab 07/18/17 0839 07/19/17 0313 07/21/17 0234 07/23/17 0404  WBC 9.0 9.2 7.1 8.5  NEUTROABS 8.4* 7.9*  --   --   HGB 12.4 11.1* 12.1 11.7*  HCT 39.1 35.7* 38.3 36.3  MCV 83.9 83.4 83.4 81.8  PLT 101* 102* 122* 151    Medications:    . acidophilus  1 capsule Oral Daily  . aspirin EC  81 mg Oral Daily  . calcitRIOL  0.25 mcg Oral Daily  . cefdinir  300 mg Oral Daily  . clopidogrel  75 mg Oral Daily  . febuxostat  40 mg Oral Daily  . fesoterodine  8 mg Oral Daily  . guaiFENesin  1,200 mg Oral BID  . heparin  5,000 Units Subcutaneous Q8H  .  insulin aspart  0-9 Units Subcutaneous TID WC  . insulin detemir  25 Units Subcutaneous QHS  . ivabradine  7.5 mg Oral BID WC  . levothyroxine  125 mcg Oral QAC breakfast  . pantoprazole  40 mg Oral Daily  . pravastatin  80 mg Oral q1800  . pregabalin  75 mg Oral BID  . sildenafil  60 mg Oral TID  . sodium chloride flush  3 mL Intravenous Q12H      Grace Santee, MD 07/24/2017, 7:48 AM

## 2017-07-24 NOTE — Progress Notes (Signed)
PROGRESS NOTE    Grace Bradford  GEX:[id number] DOB: 1945-06-09 DOA: 07/18/2017 PCP: Jani Gravel, MD    Brief Narrative: Grace Bradford is a 72 y.o. female with a Past Medical History of DM, CAD, HTN, CKD, and combined chronic heart failure who presents with SOB.  Fever to 101.4 overnight.  No orthopnea, no LE edema.  She is somnolent and defers most questions to her husband.  She is moderately ill-appearing.  RRR. Lungs CTA B with minimally increased WOB.  No LE edema.  Assessment & Plan:   Principal Problem:   Acute respiratory failure with hypoxia (HCC) Active Problems:   Cardiomyopathy ([place])   Diabetes mellitus ([place])   Hypertension   MI (myocardial infarction) ([place])   Hx of stroke without residual deficits   Gastroesophageal reflux   Neuropathy   CHF (congestive heart failure) (HCC)   Chronic systolic heart failure (HCC)   Solitary pulmonary nodule   OSA (obstructive sleep apnea)   Pulmonary HTN (HCC)   Acute on chronic combined systolic (congestive) and diastolic (congestive) heart failure (HCC)   Type 2 diabetes mellitus (HCC)   CKD (chronic kidney disease), stage IV (HCC)   Influenza  Acute hypoxic respiratory failure. Influenza A;  Chest X ray; stable cardiomegaly with mild central pulmonary vascular congestion. Hypoinflation of the lungs with with mild subsegmental atelectasis.  Covering for  superimpose PNA. Continue Omnicef . Multiple risk factor for PNA>  Nebulizer treatment. Finished course of tamiflu  No wheezing, stopped  solumedrol.  Continue to hold Torsemide due to worsening kidney function.  Improved. On RA Wheezing today, will start Pulmicort and schedule nebulizer.   Acute on Chronic combine Heart failure exacerbation; CAD Prior weight 231.  Weight today 234--235--233--235 Holding entresto due to AKI.  Continue with Revatio.  Received 80 mg IV lasix on 3-26.  Received one dose of metolazone 3-27.  Hold diuretics due to worsening renal function.    Compensated.    Acute Renal Failure on CKD;  Cr baseline 2.2--2.3 Worsening cr and increase BUN. She denies nausea, she is not confused.  Appreciate nephrology assistance.  Cr started to decrease to 2.5, BUN elevated at 114.   History of pulmonary hypertension.  Continue Silfenafil  DM type II On levemir. Continue with levemir to 25 BID>    Hypothyroidism; continue with synthroid. TSH low but Free T 3 low also.   HTN; hold entresto.    DVT prophylaxis: Heparin  Code Status: Full code.  Family Communication: Daughter at bedside.  Disposition Plan: home soon.    Consultants:   none   Procedures:   none   Antimicrobials: Levaquin.    Subjective: She is congested on her nose and sinus. Breathing ok, having cough and wheezing.   Objective: Vitals:   07/23/17 2340 07/24/17 0500 07/24/17 0923 07/24/17 1019  BP: (!) 157/64  129/67   Pulse: (!) 58  68   Resp: 16  18   Temp: 98.5 F (36.9 C)  98.7 F (37.1 C)   TempSrc: Oral  Oral   SpO2: 97%  97% 97%  Weight:  107.8 kg (237 lb 10.5 oz)    Height:        Intake/Output Summary (Last 24 hours) at 07/24/2017 1313 Last data filed at 07/24/2017 1027 Gross per 24 hour  Intake 360 ml  Output 2600 ml  Net -2240 ml   Filed Weights   07/22/17 0421 07/23/17 0500 07/24/17 0500  Weight: 106.7 kg (235 lb 3.7 oz) 107  kg (235 lb 14.3 oz) 107.8 kg (237 lb 10.5 oz)    Examination:  General exam: NAD Respiratory system; Normal respiratory effort, bilateral expiratory wheezing.  Cardiovascular system: S1 , S 2 RRR Gastrointestinal system: BS present, soft, nt Central nervous system:  Alert non focal.  Extremities; no edema Skin: no rash   Psychiatry: affect appropriate.   Data Reviewed: I have personally reviewed following labs and imaging studies  CBC: Recent Labs  Lab 07/18/17 0839 07/19/17 0313 07/21/17 0234 07/23/17 0404  WBC 9.0 9.2 7.1 8.5  NEUTROABS 8.4* 7.9*  --   --   HGB 12.4 11.1* 12.1 11.7*   HCT 39.1 35.7* 38.3 36.3  MCV 83.9 83.4 83.4 81.8  PLT 101* 102* 122* 665   Basic Metabolic Panel: Recent Labs  Lab 07/20/17 0217 07/21/17 0234 07/22/17 0403 07/23/17 0404 07/24/17 0827  NA 137 136 135 135 136  K 4.1 3.7 3.5 3.7 3.5  CL 96* 95* 93* 94* 98*  CO2 26 28 29 27 26   GLUCOSE 231* 348* 148* 208* 113*  BUN 77* 82* 100* 108* 114*  CREATININE 2.36* 2.40* 2.74* 2.83* 2.52*  CALCIUM 8.3* 8.5* 8.5* 8.8* 9.1   GFR: Estimated Creatinine Clearance: 24 mL/min (A) (by C-G formula based on SCr of 2.52 mg/dL (H)). Liver Function Tests: Recent Labs  Lab 07/18/17 0839  AST 18  ALT 13*  ALKPHOS 61  BILITOT 0.7  PROT 7.6  ALBUMIN 3.7   No results for input(s): LIPASE, AMYLASE in the last 168 hours. No results for input(s): AMMONIA in the last 168 hours. Coagulation Profile: No results for input(s): INR, PROTIME in the last 168 hours. Cardiac Enzymes: Recent Labs  Lab 07/18/17 1253 07/18/17 1815 07/19/17 0038  TROPONINI 0.04* 0.04* 0.04*   BNP (last 3 results) No results for input(s): PROBNP in the last 8760 hours. HbA1C: No results for input(s): HGBA1C in the last 72 hours. CBG: Recent Labs  Lab 07/23/17 1115 07/23/17 1655 07/23/17 2200 07/24/17 0728 07/24/17 1230  GLUCAP 201* 206* 168* 126* 167*   Lipid Profile: No results for input(s): CHOL, HDL, LDLCALC, TRIG, CHOLHDL, LDLDIRECT in the last 72 hours. Thyroid Function Tests: No results for input(s): TSH, T4TOTAL, FREET4, T3FREE, THYROIDAB in the last 72 hours. Anemia Panel: No results for input(s): VITAMINB12, FOLATE, FERRITIN, TIBC, IRON, RETICCTPCT in the last 72 hours. Sepsis Labs: Recent Labs  Lab 07/18/17 0843  LATICACIDVEN 1.11    Recent Results (from the past 240 hour(s))  Blood culture (routine x 2)     Status: None   Collection Time: 07/18/17  7:03 AM  Result Value Ref Range Status   Specimen Description BLOOD RIGHT HAND  Final   Special Requests   Final    BOTTLES DRAWN AEROBIC AND  ANAEROBIC Blood Culture results may not be optimal due to an inadequate volume of blood received in culture bottles   Culture   Final    NO GROWTH 5 DAYS Performed at [facility], [place] [address] [place], [place] [address]    Report Status 07/23/2017 FINAL  Final  Blood culture (routine x 2)     Status: None   Collection Time: 07/18/17  8:35 AM  Result Value Ref Range Status   Specimen Description BLOOD RIGHT FOREARM  Final   Special Requests   Final    BOTTLES DRAWN AEROBIC AND ANAEROBIC Blood Culture adequate volume   Culture   Final    NO GROWTH 5 DAYS Performed at [facility], [place] [address] [place], [place] [address]    Report Status 07/23/2017 FINAL  Final         Radiology Studies: No results found.      Scheduled Meds: . acidophilus  1 capsule Oral Daily  . aspirin EC  81 mg Oral Daily  . budesonide (PULMICORT) nebulizer solution  0.25 mg Nebulization BID  . calcitRIOL  0.25 mcg Oral Daily  . cefdinir  300 mg Oral Daily  . clopidogrel  75 mg Oral Daily  . febuxostat  40 mg Oral Daily  . fesoterodine  8 mg Oral Daily  . fluticasone  1 spray Each Nare Daily  . guaiFENesin  1,200 mg Oral BID  . heparin  5,000 Units Subcutaneous Q8H  . insulin aspart  0-9 Units Subcutaneous TID WC  . insulin detemir  25 Units Subcutaneous QHS  . ipratropium-albuterol  3 mL Nebulization Q6H  . ivabradine  7.5 mg Oral BID WC  . levothyroxine  125 mcg Oral QAC breakfast  . pantoprazole  40 mg Oral Daily  . pravastatin  80 mg Oral q1800  . pregabalin  75 mg Oral BID  . sildenafil  60 mg Oral TID  . sodium chloride flush  3 mL Intravenous Q12H   Continuous Infusions: . sodium chloride       LOS: 6 days    Time spent: 35 minutes    Elmarie Shiley, MD [facility] Pager [phone]  If 7PM-7AM, please contact night-coverage [email or web address] Password [email or web address] 07/24/2017, 1:13 PM

## 2017-07-25 LAB — BASIC METABOLIC PANEL
Anion gap: 12 (ref 5–15)
BUN: 100 mg/dL — AB (ref 6–20)
CALCIUM: 9.2 mg/dL (ref 8.9–10.3)
CO2: 27 mmol/L (ref 22–32)
CREATININE: 2.14 mg/dL — AB (ref 0.44–1.00)
Chloride: 100 mmol/L — ABNORMAL LOW (ref 101–111)
GFR calc non Af Amer: 22 mL/min — ABNORMAL LOW (ref >60)
GFR, EST AFRICAN AMERICAN: 26 mL/min — AB (ref >60)
Glucose, Bld: 151 mg/dL — ABNORMAL HIGH (ref 65–99)
Potassium: 3.5 mmol/L (ref 3.5–5.1)
SODIUM: 139 mmol/L (ref 135–145)

## 2017-07-25 LAB — CBC
HCT: 33.4 % — ABNORMAL LOW (ref 36.0–46.0)
Hemoglobin: 11.2 g/dL — ABNORMAL LOW (ref 12.0–15.0)
MCH: 27.1 pg (ref 26.0–34.0)
MCHC: 33.5 g/dL (ref 30.0–36.0)
MCV: 80.7 fL (ref 78.0–100.0)
PLATELETS: 145 10*3/uL — AB (ref 150–400)
RBC: 4.14 MIL/uL (ref 3.87–5.11)
RDW: 16 % — AB (ref 11.5–15.5)
WBC: 11.1 10*3/uL — ABNORMAL HIGH (ref 4.0–10.5)

## 2017-07-25 LAB — GLUCOSE, CAPILLARY
GLUCOSE-CAPILLARY: 136 mg/dL — AB (ref 65–99)
Glucose-Capillary: 268 mg/dL — ABNORMAL HIGH (ref 65–99)

## 2017-07-25 MED ORDER — GUAIFENESIN ER 600 MG PO TB12: 1200.0000 mg | ORAL_TABLET | Freq: Two times a day (BID) | ORAL | 0 refills | Status: DC

## 2017-07-25 MED ORDER — BUDESONIDE 0.25 MG/2ML IN SUSP: 0.2500 mg | Freq: Two times a day (BID) | RESPIRATORY_TRACT | 12 refills | Status: DC

## 2017-07-25 MED ORDER — IPRATROPIUM-ALBUTEROL 0.5-2.5 (3) MG/3ML IN SOLN: 3.0000 mL | Freq: Three times a day (TID) | RESPIRATORY_TRACT | 0 refills | Status: DC

## 2017-07-25 MED ORDER — CEFDINIR 300 MG PO CAPS: 300.0000 mg | ORAL_CAPSULE | Freq: Two times a day (BID) | ORAL | 0 refills | Status: DC

## 2017-07-25 MED ORDER — GUAIFENESIN-DM 100-10 MG/5ML PO SYRP: 5.0000 mL | ORAL_SOLUTION | ORAL | 0 refills | Status: DC | PRN

## 2017-07-25 MED ORDER — POTASSIUM CHLORIDE CRYS ER 20 MEQ PO TBCR
20.0000 meq | EXTENDED_RELEASE_TABLET | Freq: Every day | ORAL | 0 refills | Status: DC
Start: 2017-07-25 — End: 2017-08-02

## 2017-07-25 MED ORDER — INSULIN DETEMIR 100 UNIT/ML FLEXPEN: 25.0000 [IU] | PEN_INJECTOR | Freq: Every day | SUBCUTANEOUS | 0 refills | Status: DC

## 2017-07-25 MED ORDER — TORSEMIDE 100 MG PO TABS: 100.0000 mg | ORAL_TABLET | Freq: Every day | ORAL | 0 refills | Status: DC

## 2017-07-25 MED ORDER — DIAZEPAM 5 MG PO TABS: 5.0000 mg | ORAL_TABLET | Freq: Two times a day (BID) | ORAL | 0 refills | Status: DC | PRN

## 2017-07-25 MED ORDER — CEFDINIR 300 MG PO CAPS: 300.0000 mg | ORAL_CAPSULE | Freq: Every day | ORAL | 0 refills | Status: DC

## 2017-07-25 NOTE — Progress Notes (Addendum)
S:Feels better and wants to go home O:BP (!) 152/63 (BP Location: Left Wrist)   Pulse 60   Temp 97.9 F (36.6 C) (Oral)   Resp 16   Ht 5\' 2"  (1.575 m)   Wt 107.9 kg (237 lb 14 oz)   SpO2 96%   BMI 43.51 kg/m   Intake/Output Summary (Last 24 hours) at 07/25/2017 1227 Last data filed at 07/25/2017 0935 Gross per 24 hour  Intake 478 ml  Output 900 ml  Net -422 ml   Intake/Output: I/O last 3 completed shifts: In: 720 [P.O.:720] Out: 3100 [Urine:3100]  Intake/Output this shift:  Total I/O In: 118 [P.O.:118] Out: -  Weight change: 0.1 kg (3.5 oz) Gen: NAD CVS: no rub Resp: bilateral end-exp wheezes YHC:WCBJSE Ext: trace/minimal pretibial edema  Recent Labs  Lab 07/19/17 0313 07/20/17 0217 07/21/17 0234 07/22/17 0403 07/23/17 0404 07/24/17 0827 07/25/17 0656  NA 140 137 136 135 135 136 139  K 3.7 4.1 3.7 3.5 3.7 3.5 3.5  CL 97* 96* 95* 93* 94* 98* 100*  CO2 30 26 28 29 27 26 27   GLUCOSE 157* 231* 348* 148* 208* 113* 151*  BUN 75* 77* 82* 100* 108* 114* 100*  CREATININE 2.45* 2.36* 2.40* 2.74* 2.83* 2.52* 2.14*  CALCIUM 8.2* 8.3* 8.5* 8.5* 8.8* 9.1 9.2   Liver Function Tests: No results for input(s): AST, ALT, ALKPHOS, BILITOT, PROT, ALBUMIN in the last 168 hours. No results for input(s): LIPASE, AMYLASE in the last 168 hours. No results for input(s): AMMONIA in the last 168 hours. CBC: Recent Labs  Lab 07/19/17 0313 07/21/17 0234 07/23/17 0404 07/25/17 0656  WBC 9.2 7.1 8.5 11.1*  NEUTROABS 7.9*  --   --   --   HGB 11.1* 12.1 11.7* 11.2*  HCT 35.7* 38.3 36.3 33.4*  MCV 83.4 83.4 81.8 80.7  PLT 102* 122* 151 145*   Cardiac Enzymes: Recent Labs  Lab 07/18/17 1253 07/18/17 1815 07/19/17 0038  TROPONINI 0.04* 0.04* 0.04*   CBG: Recent Labs  Lab 07/24/17 1230 07/24/17 1652 07/24/17 2155 07/25/17 0812 07/25/17 1139  GLUCAP 167* 237* 261* 136* 268*    Iron Studies: No results for input(s): IRON, TIBC, TRANSFERRIN, FERRITIN in the last 72  hours. Studies/Results: No results found. Marland Kitchen acidophilus  1 capsule Oral Daily  . aspirin EC  81 mg Oral Daily  . budesonide (PULMICORT) nebulizer solution  0.25 mg Nebulization BID  . calcitRIOL  0.25 mcg Oral Daily  . clopidogrel  75 mg Oral Daily  . febuxostat  40 mg Oral Daily  . fesoterodine  8 mg Oral Daily  . fluticasone  1 spray Each Nare Daily  . guaiFENesin  1,200 mg Oral BID  . heparin  5,000 Units Subcutaneous Q8H  . insulin aspart  0-9 Units Subcutaneous TID WC  . insulin detemir  25 Units Subcutaneous QHS  . ipratropium-albuterol  3 mL Nebulization BID  . ivabradine  7.5 mg Oral BID WC  . levothyroxine  125 mcg Oral QAC breakfast  . pantoprazole  40 mg Oral Daily  . pravastatin  80 mg Oral q1800  . pregabalin  75 mg Oral BID  . sildenafil  60 mg Oral TID  . sodium chloride flush  3 mL Intravenous Q12H    BMET    Component Value Date/Time   NA 139 07/25/2017 0656   K 3.5 07/25/2017 0656   CL 100 (L) 07/25/2017 0656   CO2 27 07/25/2017 0656   GLUCOSE 151 (H) 07/25/2017 8315  BUN 100 (H) 07/25/2017 0656   CREATININE 2.14 (H) 07/25/2017 0656   CALCIUM 9.2 07/25/2017 0656   GFRNONAA 22 (L) 07/25/2017 0656   GFRAA 26 (L) 07/25/2017 0656   CBC    Component Value Date/Time   WBC 11.1 (H) 07/25/2017 0656   RBC 4.14 07/25/2017 0656   HGB 11.2 (L) 07/25/2017 0656   HCT 33.4 (L) 07/25/2017 0656   PLT 145 (L) 07/25/2017 0656   MCV 80.7 07/25/2017 0656   MCH 27.1 07/25/2017 0656   MCHC 33.5 07/25/2017 0656   RDW 16.0 (H) 07/25/2017 0656   LYMPHSABS 0.6 (L) 07/19/2017 0313   MONOABS 0.7 07/19/2017 0313   EOSABS 0.0 07/19/2017 0313   BASOSABS 0.0 07/19/2017 0313     Assessment/Plan:  1. AKI/CKD stage 3-4 in setting of decompensated CHF and escalating diuresis.  She has a baseline Scr of 2.2 and followed by Dr. Florene Glen.  Doing well with po demadex and Scr at or below baseline.  She is scheduled to see Dr. Florene Glen in early May.  Stable from our standpoint and  will f/u with our office.  Will have labs drawn in 1 week. 2. Influenza A- treated with tamiflu 3. CHF- improved ok to resume torsemide but would decrease dose to 100mg  daily (not bid) and follow up with Dr. Haroldine Laws.  Continue to hold Entresto and would not restart that at this time.  4. Anemia of CKD- stable 5. Pulmonary HTN- on silfenafil 6. DM type 2- per primary svc 7. Disposition- stable for discharge from our standpoint.  F/u with Dr. Florene Glen as above.   Donetta Potts, MD [employer] [phone]

## 2017-07-25 NOTE — Progress Notes (Signed)
Pt informed and verbally agreed to be discharged home. Pt alert and oriented, vital signs stable on room air. Went over discharge instructions with pt and gave prescriptions to pt's daughter who was also present at bedside for discharge instructions. Removed pt's IV and answered all questions. Pt wheeled off unit by Nurse Tech with belongings and with daughter.

## 2017-07-25 NOTE — Progress Notes (Signed)
Physical Therapy Treatment Patient Details Name: Grace Bradford MRN: [id number] DOB: Sep 08, 1945 Today's Date: 07/25/2017    History of Present Illness Pt is a 72 y/o female admitted secondary to acute respiratory failure with hypoxia. Pt +Flu. PMH including but not limited to DM, CAD, HTN, CKD, and combined chronic heart failure.    PT Comments    Pt admitted with above diagnosis. Pt currently with functional limitations due to balance and endurance deficits. Pt was able to ambulate without device with overall fair safety. Incr distance today.  Also ambulated with rollator and had good safety with this as well.  Pt tells PT she does not want a rollator at this time.  Progressing well.  Pt will benefit from skilled PT to increase their independence and safety with mobility to allow discharge to the venue listed below.     Follow Up Recommendations  No PT follow up;Supervision - Intermittent     Equipment Recommendations  None recommended by PT    Recommendations for Other Services       Precautions / Restrictions Precautions Precautions: None Restrictions Weight Bearing Restrictions: No    Mobility  Bed Mobility Overal bed mobility: Modified Independent                Transfers Overall transfer level: Modified independent Equipment used: None Transfers: Sit to/from Stand Sit to Stand: Supervision         General transfer comment: supervision for safety, no UE supports needed  Ambulation/Gait Ambulation/Gait assistance: Min guard Ambulation Distance (Feet): 145 Feet Assistive device: None;4-wheeled walker Gait Pattern/deviations: Step-through pattern;Decreased step length - right;Decreased step length - left;Decreased stride length Gait velocity: decreased Gait velocity interpretation: Below normal speed for age/gender General Gait Details: pt with slow, cautious gait without use of an AD; Pt tried the rollator and did report she liked it with good safety with  it.  She declines getting one at this time.  Is not grossly unsteady without device but reaches for furniture at times.     Stairs            Wheelchair Mobility    Modified Rankin (Stroke Patients Only)       Balance Overall balance assessment: Needs assistance Sitting-balance support: Feet supported Sitting balance-Leahy Scale: Good     Standing balance support: During functional activity;No upper extremity supported Standing balance-Leahy Scale: Fair Standing balance comment: Static stance with supervision.                             Cognition Arousal/Alertness: Awake/alert Behavior During Therapy: WFL for tasks assessed/performed Overall Cognitive Status: Within Functional Limits for tasks assessed                                        Exercises      General Comments        Pertinent Vitals/Pain Pain Assessment: No/denies pain    Home Living                      Prior Function            PT Goals (current goals can now be found in the care plan section) Acute Rehab PT Goals Patient Stated Goal: return home Progress towards PT goals: Progressing toward goals    Frequency    Min 3X/week  PT Plan Current plan remains appropriate    Co-evaluation              AM-PAC PT "6 Clicks" Daily Activity  Outcome Measure  Difficulty turning over in bed (including adjusting bedclothes, sheets and blankets)?: None Difficulty moving from lying on back to sitting on the side of the bed? : None Difficulty sitting down on and standing up from a chair with arms (e.g., wheelchair, bedside commode, etc,.)?: None Help needed moving to and from a bed to chair (including a wheelchair)?: None Help needed walking in hospital room?: None Help needed climbing 3-5 steps with a railing? : A Little 6 Click Score: 23    End of Session Equipment Utilized During Treatment: Gait belt Activity Tolerance: Patient limited by  fatigue Patient left: with call bell/phone within reach;with family/visitor present;in chair Nurse Communication: Mobility status PT Visit Diagnosis: Other abnormalities of gait and mobility (R26.89)     Time: 1500-1509 PT Time Calculation (min) (ACUTE ONLY): 9 min  Charges:  $Gait Training: 8-22 mins                    G Codes:       Navina Wohlers,PT Acute Rehabilitation [phone] (pager)    Berline Lopes 07/25/2017, 4:02 PM

## 2017-07-25 NOTE — Discharge Summary (Signed)
Physician Discharge Summary  KIANNAH GRUNOW ZOX:[id number] DOB: 1945/11/07 DOA: 07/18/2017  PCP: Jani Gravel, MD  Admit date: 07/18/2017 Discharge date: 07/25/2017  Admitted From: Home  Disposition:  Home   Recommendations for Outpatient Follow-up:  1. Follow up with PCP in 1-2 weeks 2. Please obtain BMP/CBC in one week 3. Follow up with HF team, adjust diuretics as needed.     Discharge Condition: Stable.  CODE STATUS: Full code.  Diet recommendation: Heart Healthy   Brief/Interim Summary:  Brief Narrative: DORTHY MAGNUSSEN a 72 y.o.femalewith a Past Medical History of DM, CAD, HTN, CKD, and combined chronic heart failure who presents with SOB. Fever to 101.4 overnight. No orthopnea, no LE edema. She is somnolent and defers most questions to her husband. She is moderately ill-appearing. RRR. Lungs CTA B with minimally increased WOB. No LE edema.  Assessment & Plan:   Principal Problem:   Acute respiratory failure with hypoxia (HCC) Active Problems:   Cardiomyopathy ([place])   Diabetes mellitus ([place])   Hypertension   MI (myocardial infarction) ([place])   Hx of stroke without residual deficits   Gastroesophageal reflux   Neuropathy   CHF (congestive heart failure) (HCC)   Chronic systolic heart failure (HCC)   Solitary pulmonary nodule   OSA (obstructive sleep apnea)   Pulmonary HTN (HCC)   Acute on chronic combined systolic (congestive) and diastolic (congestive) heart failure (HCC)   Type 2 diabetes mellitus (HCC)   CKD (chronic kidney disease), stage IV (HCC)   Influenza  Acute hypoxic respiratory failure. Influenza A;  Chest X ray; stable cardiomegaly with mild central pulmonary vascular congestion. Hypoinflation of the lungs with with mild subsegmental atelectasis.  Covering for  superimpose PNA. [employer] . Multiple risk factor for PNA>  Nebulizer treatment. Finished course of tamiflu  No wheezing, stopped  solumedrol.  Improved. On RA Started   Pulmicort and schedule nebulizer.  Hypoxemia improved. Cough persist but improved. She feels goo, wants to go home.   Acute on Chronic combine Heart failure exacerbation; CAD Weight today 234--235--233--235 Holding entresto due to AKI.  Continue with Revatio.  Received 80 mg IV lasix on 3-26.  Received one dose of metolazone 3-27.  We were holding torsemide due to AKI cr up to 2.7. Now has decreased to 2.1. Resume lower dose torsemide 100 mg daily. Hold Entresto due to AKI. Will try to arrange follow up with HF clinic.  Compensated. Negative 7 L. Urine out put 1.8 L.    Acute Renal Failure on CKD;  Cr baseline 2.2--2.3 Worsening cr and increase BUN. She denies nausea, she is not confused.  Appreciate nephrology assistance.  Cr decrease to 2.1 baseline. Discussed with nephrology ok to discharge on lower dose home torsemide 100 mg daily. Hold entresto. Follow up with power in 1 week for labs. Also needs to follow up with Heart failure team.   History of pulmonary hypertension. Continue Silfenafil  DM type II On levemir. Continue with levemir to 25    Hypothyroidism; continue with synthroid. TSH low but Free T 3 low also.   HTN; resume torsemide.      Discharge Diagnoses:  Principal Problem:   Acute respiratory failure with hypoxia ([place]) Active Problems:   Cardiomyopathy ([place])   Diabetes mellitus ([place])   Hypertension   MI (myocardial infarction) ([place])   Hx of stroke without residual deficits   Gastroesophageal reflux   Neuropathy   CHF (congestive heart failure) ([place])   Chronic systolic heart failure ([place])  Solitary pulmonary nodule   OSA (obstructive sleep apnea)   Pulmonary HTN (HCC)   Acute on chronic combined systolic (congestive) and diastolic (congestive) heart failure (HCC)   Type 2 diabetes mellitus ([place])   CKD (chronic kidney disease), stage IV ([place])   Influenza    Discharge Instructions   Allergies as of 07/25/2017      Reactions   Rocephin  [ceftriaxone Sodium In Dextrose] Itching   Tolerated cefdinir 07/2017      Medication List    STOP taking these medications   carvedilol 6.25 MG tablet Commonly known as:  COREG   CASCARA SAGRADA PO   DIADERM FOOT REJUVENATING EX   diclofenac sodium 1 % Gel Commonly known as:  VOLTAREN   GINGER ROOT PO   HYDROcodone-acetaminophen 7.5-325 MG tablet Commonly known as:  NORCO   hydrOXYzine 25 MG tablet Commonly known as:  ATARAX/VISTARIL   lidocaine 5 % ointment Commonly known as:  XYLOCAINE   metaxalone 800 MG tablet Commonly known as:  SKELAXIN   metolazone 2.5 MG tablet Commonly known as:  ZAROXOLYN   ondansetron 4 MG disintegrating tablet Commonly known as:  ZOFRAN ODT   OVER THE COUNTER MEDICATION   sacubitril-valsartan 97-103 MG Commonly known as:  ENTRESTO     TAKE these medications   acetaminophen 500 MG tablet Commonly known as:  TYLENOL Take 1,000 mg by mouth every 6 (six) hours as needed for mild pain or moderate pain.   ARNICA EX Apply 1 application topically 2 (two) times daily as needed (pain).   aspirin EC 81 MG tablet Take 81 mg by mouth daily.   budesonide 0.25 MG/2ML nebulizer solution Commonly known as:  PULMICORT Take 2 mLs (0.25 mg total) by nebulization 2 (two) times daily.   calcitRIOL 0.25 MCG capsule Commonly known as:  ROCALTROL Take 0.3 mcg by mouth daily. What changed:  Another medication with the same name was removed. Continue taking this medication, and follow the directions you see here.   CALCIUM 600 + D PO Take 1 tablet by mouth daily.   cetirizine 10 MG tablet Commonly known as:  ZYRTEC Take 10 mg by mouth as needed for allergies or rhinitis.   clobetasol cream 0.05 % Commonly known as:  TEMOVATE Apply 1 application topically 2 (two) times daily. To affected area   clopidogrel 75 MG tablet Commonly known as:  PLAVIX Take 75 mg by mouth daily.   diazepam 5 MG tablet Commonly known as:  VALIUM Take 1 tablet  (5 mg total) by mouth every 12 (twelve) hours as needed for muscle spasms. What changed:    when to take this  reasons to take this   febuxostat 40 MG tablet Commonly known as:  ULORIC Take 40 mg by mouth daily.   fluticasone 50 MCG/ACT nasal spray Commonly known as:  FLONASE Place 2 sprays into both nostrils 2 (two) times daily as needed for allergies.   guaiFENesin 600 MG 12 hr tablet Commonly known as:  MUCINEX Take 2 tablets (1,200 mg total) by mouth 2 (two) times daily.   guaiFENesin-dextromethorphan 100-10 MG/5ML syrup Commonly known as:  ROBITUSSIN DM Take 5 mLs by mouth every 4 (four) hours as needed for cough.   Insulin Detemir 100 UNIT/ML Pen Commonly known as:  LEVEMIR FLEXPEN Inject 25 Units into the skin daily. What changed:    how much to take  when to take this   ipratropium-albuterol 0.5-2.5 (3) MG/3ML Soln Commonly known as:  DUONEB Take 3 mLs  by nebulization 3 (three) times daily.   ivabradine 7.5 MG Tabs tablet Commonly known as:  CORLANOR Take 1 tablet (7.5 mg total) by mouth 2 (two) times daily with a meal. What changed:  Another medication with the same name was removed. Continue taking this medication, and follow the directions you see here.   levothyroxine 125 MCG tablet Commonly known as:  SYNTHROID, LEVOTHROID Take 125 mcg by mouth daily.   multivitamin with minerals Tabs tablet Take 1 tablet by mouth daily.   Omeprazole-Sodium Bicarbonate 20-1100 MG Caps capsule Commonly known as:  ZEGERID Take 1 capsule by mouth daily.   potassium chloride SA 20 MEQ tablet Commonly known as:  K-DUR,KLOR-CON Take 1 tablet (20 mEq total) by mouth daily. What changed:  when to take this   pravastatin 80 MG tablet Commonly known as:  PRAVACHOL Take 80 mg by mouth Daily.   pregabalin 75 MG capsule Commonly known as:  LYRICA Take 75 mg by mouth 2 (two) times daily.   PROBIOTIC PO Take 1 capsule by mouth daily.   sildenafil 20 MG  tablet Commonly known as:  REVATIO TAKE 3 TABLETS BY MOUTH THREE TIMES DAILY   tolterodine 4 MG 24 hr capsule Commonly known as:  DETROL LA Take 8 mg by mouth daily.   torsemide 100 MG tablet Commonly known as:  DEMADEX Take 1 tablet (100 mg total) by mouth daily. What changed:  when to take this   VICTOZA 18 MG/3ML Sopn Generic drug:  liraglutide Inject 1.8 mg into the skin at bedtime.       Allergies  Allergen Reactions  . Rocephin [Ceftriaxone Sodium In Dextrose] Itching    Tolerated cefdinir 07/2017    Consultations:  Nephrology    Procedures/Studies: Dg Chest 2 View  Result Date: 07/18/2017 CLINICAL DATA:  Shortness of breath. EXAM: CHEST - 2 VIEW COMPARISON:  Radiographs of January 31, 2017. FINDINGS: Stable cardiomegaly is noted with central pulmonary vascular congestion. Hypoinflation of the lungs is noted with mild bibasilar subsegmental atelectasis. Atherosclerosis of thoracic aorta is noted. No pneumothorax or pleural effusion is noted. Bony thorax is unremarkable. IMPRESSION: Stable cardiomegaly with mild central pulmonary vascular congestion. Hypoinflation of the lungs with mild bibasilar subsegmental atelectasis. Aortic Atherosclerosis (ICD10-I70.0). Electronically Signed   By: Marijo Conception, M.D.   On: 07/18/2017 07:43       Subjective: She is breathing better. Cough persist but not worse.  Willing to go home.   Discharge Exam: Vitals:   07/25/17 0935 07/25/17 0936  BP:    Pulse:    Resp:    Temp:    SpO2: 96% 96%   Vitals:   07/25/17 0448 07/25/17 0754 07/25/17 0935 07/25/17 0936  BP:  (!) 152/63    Pulse:  60    Resp:  16    Temp:  97.9 F (36.6 C)    TempSrc:  Oral    SpO2:  93% 96% 96%  Weight: 107.9 kg (237 lb 14 oz)     Height:        General: Pt is alert, awake, not in acute distress Cardiovascular: RRR, S1/S2 +, no rubs, no gallops Respiratory: CTA bilaterally, no wheezing, no rhonchi Abdominal: Soft, NT, ND, bowel sounds  + Extremities: no edema, no cyanosis    The results of significant diagnostics from this hospitalization (including imaging, microbiology, ancillary and laboratory) are listed below for reference.     Microbiology: Recent Results (from the past 240 hour(s))  Blood culture (routine  x 2)     Status: None   Collection Time: 07/18/17  7:03 AM  Result Value Ref Range Status   Specimen Description BLOOD RIGHT HAND  Final   Special Requests   Final    BOTTLES DRAWN AEROBIC AND ANAEROBIC Blood Culture results may not be optimal due to an inadequate volume of blood received in culture bottles   Culture   Final    NO GROWTH 5 DAYS Performed at [facility], [place] [address] [place], [place] [address]    Report Status 07/23/2017 FINAL  Final  Blood culture (routine x 2)     Status: None   Collection Time: 07/18/17  8:35 AM  Result Value Ref Range Status   Specimen Description BLOOD RIGHT FOREARM  Final   Special Requests   Final    BOTTLES DRAWN AEROBIC AND ANAEROBIC Blood Culture adequate volume   Culture   Final    NO GROWTH 5 DAYS Performed at [facility], [place] [address] [place], [place] [address]    Report Status 07/23/2017 FINAL  Final     Labs: BNP (last 3 results) Recent Labs    09/06/16 1252 01/31/17 2021 07/18/17 0839  BNP 395.4* 2,338.4* 417.4*   Basic Metabolic Panel: Recent Labs  Lab 07/21/17 0234 07/22/17 0403 07/23/17 0404 07/24/17 0827 07/25/17 0656  NA 136 135 135 136 139  K 3.7 3.5 3.7 3.5 3.5  CL 95* 93* 94* 98* 100*  CO2 28 29 27 26 27   GLUCOSE 348* 148* 208* 113* 151*  BUN 82* 100* 108* 114* 100*  CREATININE 2.40* 2.74* 2.83* 2.52* 2.14*  CALCIUM 8.5* 8.5* 8.8* 9.1 9.2   Liver Function Tests: No results for input(s): AST, ALT, ALKPHOS, BILITOT, PROT, ALBUMIN in the last 168 hours. No results for input(s): LIPASE, AMYLASE in the last 168 hours. No results for input(s): AMMONIA in the last 168 hours. CBC: Recent Labs  Lab  07/19/17 0313 07/21/17 0234 07/23/17 0404 07/25/17 0656  WBC 9.2 7.1 8.5 11.1*  NEUTROABS 7.9*  --   --   --   HGB 11.1* 12.1 11.7* 11.2*  HCT 35.7* 38.3 36.3 33.4*  MCV 83.4 83.4 81.8 80.7  PLT 102* 122* 151 145*   Cardiac Enzymes: Recent Labs  Lab 07/18/17 1253 07/18/17 1815 07/19/17 0038  TROPONINI 0.04* 0.04* 0.04*   BNP: Invalid input(s): POCBNP CBG: Recent Labs  Lab 07/24/17 1230 07/24/17 1652 07/24/17 2155 07/25/17 0812 07/25/17 1139  GLUCAP 167* 237* 261* 136* 268*   D-Dimer No results for input(s): DDIMER in the last 72 hours. Hgb A1c No results for input(s): HGBA1C in the last 72 hours. Lipid Profile No results for input(s): CHOL, HDL, LDLCALC, TRIG, CHOLHDL, LDLDIRECT in the last 72 hours. Thyroid function studies No results for input(s): TSH, T4TOTAL, T3FREE, THYROIDAB in the last 72 hours.  Invalid input(s): FREET3 Anemia work up No results for input(s): VITAMINB12, FOLATE, FERRITIN, TIBC, IRON, RETICCTPCT in the last 72 hours. Urinalysis    Component Value Date/Time   COLORURINE STRAW (A) 07/24/2017 1503   APPEARANCEUR CLEAR 07/24/2017 1503   LABSPEC 1.011 07/24/2017 1503   PHURINE 6.0 07/24/2017 1503   GLUCOSEU NEGATIVE 07/24/2017 1503   HGBUR NEGATIVE 07/24/2017 1503   BILIRUBINUR NEGATIVE 07/24/2017 1503   KETONESUR NEGATIVE 07/24/2017 1503   PROTEINUR NEGATIVE 07/24/2017 1503   UROBILINOGEN 0.2 10/17/2014 2232   NITRITE NEGATIVE 07/24/2017 1503   LEUKOCYTESUR TRACE (A) 07/24/2017 1503   Sepsis Labs Invalid input(s): PROCALCITONIN,  WBC,  LACTICIDVEN Microbiology Recent Results (from the past 240 hour(s))  Blood culture (routine x 2)     Status: None   Collection Time: 07/18/17  7:03 AM  Result Value Ref Range Status   Specimen Description BLOOD RIGHT HAND  Final   Special Requests   Final    BOTTLES DRAWN AEROBIC AND ANAEROBIC Blood Culture results may not be optimal due to an inadequate volume of blood received in culture bottles    Culture   Final    NO GROWTH 5 DAYS Performed at [facility], [place] [address] [place], [place] [address]    Report Status 07/23/2017 FINAL  Final  Blood culture (routine x 2)     Status: None   Collection Time: 07/18/17  8:35 AM  Result Value Ref Range Status   Specimen Description BLOOD RIGHT FOREARM  Final   Special Requests   Final    BOTTLES DRAWN AEROBIC AND ANAEROBIC Blood Culture adequate volume   Culture   Final    NO GROWTH 5 DAYS Performed at [facility], [place] [address], [place], [place] [address]    Report Status 07/23/2017 FINAL  Final     Time coordinating discharge: Over 30 minutes  SIGNED:   Elmarie Shiley, MD  [facility] 07/25/2017, 12:44 PM Pager   If 7PM-7AM, please contact night-coverage [email or web address] Password [email or web address]

## 2017-07-25 NOTE — Progress Notes (Signed)
NCM spoke to pt and dtr at bedside. Pt was independent prior to hospital stay. No NCM needs identified.Jonnie Finner RN CCM Case Mgmt phone [phone]

## 2017-07-25 NOTE — Care Management Important Message (Addendum)
Important Message  Patient Details  Name: Grace Bradford MRN: [id number] Date of Birth: 01-02-1946   Medicare Important Message Given:  Yes    Erenest Rasher, RN 07/25/2017, 6:35 PM

## 2017-08-01 DIAGNOSIS — N184 Chronic kidney disease, stage 4 (severe): Secondary | ICD-10-CM | POA: Diagnosis not present

## 2017-08-01 DIAGNOSIS — Z09 Encounter for follow-up examination after completed treatment for conditions other than malignant neoplasm: Secondary | ICD-10-CM | POA: Diagnosis not present

## 2017-08-01 DIAGNOSIS — Z5181 Encounter for therapeutic drug level monitoring: Secondary | ICD-10-CM | POA: Diagnosis not present

## 2017-08-01 DIAGNOSIS — M545 Low back pain: Secondary | ICD-10-CM | POA: Diagnosis not present

## 2017-08-01 DIAGNOSIS — E039 Hypothyroidism, unspecified: Secondary | ICD-10-CM | POA: Diagnosis not present

## 2017-08-01 DIAGNOSIS — I10 Essential (primary) hypertension: Secondary | ICD-10-CM | POA: Diagnosis not present

## 2017-08-01 DIAGNOSIS — I509 Heart failure, unspecified: Secondary | ICD-10-CM | POA: Diagnosis not present

## 2017-08-01 DIAGNOSIS — E118 Type 2 diabetes mellitus with unspecified complications: Secondary | ICD-10-CM | POA: Diagnosis not present

## 2017-08-02 ENCOUNTER — Ambulatory Visit (HOSPITAL_COMMUNITY)
Admission: RE | Admit: 2017-08-02 | Discharge: 2017-08-02 | Disposition: A | Discharge status: Home / Self Care | Payer: Medicare Other | Source: Ambulatory Visit | Attending: Cardiology | Admitting: Cardiology

## 2017-08-02 ENCOUNTER — Encounter (HOSPITAL_COMMUNITY): Payer: Self-pay

## 2017-08-02 VITALS — BP 142/74 | HR 61 | Wt 226.6 lb

## 2017-08-02 DIAGNOSIS — R51 Headache: Secondary | ICD-10-CM | POA: Insufficient documentation

## 2017-08-02 DIAGNOSIS — I428 Other cardiomyopathies: Secondary | ICD-10-CM | POA: Diagnosis not present

## 2017-08-02 DIAGNOSIS — K449 Diaphragmatic hernia without obstruction or gangrene: Secondary | ICD-10-CM | POA: Diagnosis not present

## 2017-08-02 DIAGNOSIS — Z7982 Long term (current) use of aspirin: Secondary | ICD-10-CM | POA: Insufficient documentation

## 2017-08-02 DIAGNOSIS — N179 Acute kidney failure, unspecified: Secondary | ICD-10-CM | POA: Diagnosis not present

## 2017-08-02 DIAGNOSIS — E669 Obesity, unspecified: Secondary | ICD-10-CM | POA: Diagnosis not present

## 2017-08-02 DIAGNOSIS — M170 Bilateral primary osteoarthritis of knee: Secondary | ICD-10-CM | POA: Diagnosis not present

## 2017-08-02 DIAGNOSIS — Z7902 Long term (current) use of antithrombotics/antiplatelets: Secondary | ICD-10-CM | POA: Diagnosis not present

## 2017-08-02 DIAGNOSIS — I130 Hypertensive heart and chronic kidney disease with heart failure and stage 1 through stage 4 chronic kidney disease, or unspecified chronic kidney disease: Secondary | ICD-10-CM | POA: Diagnosis not present

## 2017-08-02 DIAGNOSIS — I5042 Chronic combined systolic (congestive) and diastolic (congestive) heart failure: Secondary | ICD-10-CM | POA: Diagnosis not present

## 2017-08-02 DIAGNOSIS — I272 Pulmonary hypertension, unspecified: Secondary | ICD-10-CM | POA: Diagnosis not present

## 2017-08-02 DIAGNOSIS — Z79890 Hormone replacement therapy (postmenopausal): Secondary | ICD-10-CM | POA: Diagnosis not present

## 2017-08-02 DIAGNOSIS — E118 Type 2 diabetes mellitus with unspecified complications: Secondary | ICD-10-CM | POA: Diagnosis not present

## 2017-08-02 DIAGNOSIS — E1140 Type 2 diabetes mellitus with diabetic neuropathy, unspecified: Secondary | ICD-10-CM | POA: Diagnosis not present

## 2017-08-02 DIAGNOSIS — K219 Gastro-esophageal reflux disease without esophagitis: Secondary | ICD-10-CM | POA: Diagnosis not present

## 2017-08-02 DIAGNOSIS — N184 Chronic kidney disease, stage 4 (severe): Secondary | ICD-10-CM | POA: Insufficient documentation

## 2017-08-02 DIAGNOSIS — G4733 Obstructive sleep apnea (adult) (pediatric): Secondary | ICD-10-CM | POA: Insufficient documentation

## 2017-08-02 DIAGNOSIS — J398 Other specified diseases of upper respiratory tract: Secondary | ICD-10-CM | POA: Insufficient documentation

## 2017-08-02 DIAGNOSIS — I5022 Chronic systolic (congestive) heart failure: Secondary | ICD-10-CM | POA: Diagnosis not present

## 2017-08-02 DIAGNOSIS — I252 Old myocardial infarction: Secondary | ICD-10-CM | POA: Insufficient documentation

## 2017-08-02 DIAGNOSIS — I2510 Atherosclerotic heart disease of native coronary artery without angina pectoris: Secondary | ICD-10-CM | POA: Insufficient documentation

## 2017-08-02 DIAGNOSIS — I509 Heart failure, unspecified: Secondary | ICD-10-CM | POA: Diagnosis not present

## 2017-08-02 DIAGNOSIS — Z8673 Personal history of transient ischemic attack (TIA), and cerebral infarction without residual deficits: Secondary | ICD-10-CM | POA: Insufficient documentation

## 2017-08-02 DIAGNOSIS — Z881 Allergy status to other antibiotic agents status: Secondary | ICD-10-CM | POA: Insufficient documentation

## 2017-08-02 DIAGNOSIS — E039 Hypothyroidism, unspecified: Secondary | ICD-10-CM | POA: Diagnosis not present

## 2017-08-02 DIAGNOSIS — K766 Portal hypertension: Secondary | ICD-10-CM | POA: Diagnosis not present

## 2017-08-02 DIAGNOSIS — Z794 Long term (current) use of insulin: Secondary | ICD-10-CM | POA: Diagnosis not present

## 2017-08-02 DIAGNOSIS — E1122 Type 2 diabetes mellitus with diabetic chronic kidney disease: Secondary | ICD-10-CM | POA: Insufficient documentation

## 2017-08-02 DIAGNOSIS — I10 Essential (primary) hypertension: Secondary | ICD-10-CM

## 2017-08-02 DIAGNOSIS — Z8249 Family history of ischemic heart disease and other diseases of the circulatory system: Secondary | ICD-10-CM | POA: Insufficient documentation

## 2017-08-02 DIAGNOSIS — Z79899 Other long term (current) drug therapy: Secondary | ICD-10-CM | POA: Diagnosis not present

## 2017-08-02 DIAGNOSIS — I2721 Secondary pulmonary arterial hypertension: Secondary | ICD-10-CM

## 2017-08-02 DIAGNOSIS — N183 Chronic kidney disease, stage 3 (moderate): Secondary | ICD-10-CM | POA: Diagnosis not present

## 2017-08-02 MED ORDER — TORSEMIDE 100 MG PO TABS: ORAL_TABLET | ORAL | 2 refills | Status: DC

## 2017-08-02 NOTE — Patient Instructions (Signed)
HOLD Torsemide for 2 days  then DECREASE Torsemide to 50 mg (1/2 tab daily), may take an additional 50 mg in the PM as needed for weight greater than 233  STOP Potassium  Your physician recommends that you schedule a follow-up appointment as scheduled in 2 weeks, we will recheck labs at that time  Do the following things EVERYDAY: 1) Weigh yourself in the morning before breakfast. Write it down and keep it in a log. 2) Take your medicines as prescribed 3) Eat low salt foods-Limit salt (sodium) to 2000 mg per day.  4) Stay as active as you can everyday 5) Limit all fluids for the day to less than 2 liters
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
## 2017-08-18 ENCOUNTER — Encounter (HOSPITAL_COMMUNITY): Payer: Self-pay | Admitting: Internal Medicine

## 2017-08-18 ENCOUNTER — Other Ambulatory Visit: Payer: Self-pay

## 2017-08-18 ENCOUNTER — Ambulatory Visit (HOSPITAL_COMMUNITY)
Admission: RE | Admit: 2017-08-18 | Discharge: 2017-08-18 | Disposition: A | Discharge status: Home / Self Care | Payer: Medicare Other | Source: Ambulatory Visit | Attending: Internal Medicine | Admitting: Internal Medicine

## 2017-08-18 VITALS — BP 146/69 | HR 59 | Wt 227.5 lb

## 2017-08-18 DIAGNOSIS — E1140 Type 2 diabetes mellitus with diabetic neuropathy, unspecified: Secondary | ICD-10-CM | POA: Insufficient documentation

## 2017-08-18 DIAGNOSIS — Z7982 Long term (current) use of aspirin: Secondary | ICD-10-CM | POA: Diagnosis not present

## 2017-08-18 DIAGNOSIS — K219 Gastro-esophageal reflux disease without esophagitis: Secondary | ICD-10-CM | POA: Diagnosis not present

## 2017-08-18 DIAGNOSIS — I2510 Atherosclerotic heart disease of native coronary artery without angina pectoris: Secondary | ICD-10-CM | POA: Diagnosis not present

## 2017-08-18 DIAGNOSIS — I5022 Chronic systolic (congestive) heart failure: Secondary | ICD-10-CM | POA: Diagnosis not present

## 2017-08-18 DIAGNOSIS — Z8673 Personal history of transient ischemic attack (TIA), and cerebral infarction without residual deficits: Secondary | ICD-10-CM | POA: Diagnosis not present

## 2017-08-18 DIAGNOSIS — Z7902 Long term (current) use of antithrombotics/antiplatelets: Secondary | ICD-10-CM | POA: Diagnosis not present

## 2017-08-18 DIAGNOSIS — K449 Diaphragmatic hernia without obstruction or gangrene: Secondary | ICD-10-CM | POA: Diagnosis not present

## 2017-08-18 DIAGNOSIS — Z79899 Other long term (current) drug therapy: Secondary | ICD-10-CM | POA: Diagnosis not present

## 2017-08-18 DIAGNOSIS — Z881 Allergy status to other antibiotic agents status: Secondary | ICD-10-CM | POA: Diagnosis not present

## 2017-08-18 DIAGNOSIS — E1122 Type 2 diabetes mellitus with diabetic chronic kidney disease: Secondary | ICD-10-CM | POA: Insufficient documentation

## 2017-08-18 DIAGNOSIS — I130 Hypertensive heart and chronic kidney disease with heart failure and stage 1 through stage 4 chronic kidney disease, or unspecified chronic kidney disease: Secondary | ICD-10-CM | POA: Insufficient documentation

## 2017-08-18 DIAGNOSIS — I10 Essential (primary) hypertension: Secondary | ICD-10-CM | POA: Diagnosis not present

## 2017-08-18 DIAGNOSIS — I252 Old myocardial infarction: Secondary | ICD-10-CM | POA: Insufficient documentation

## 2017-08-18 DIAGNOSIS — I272 Pulmonary hypertension, unspecified: Secondary | ICD-10-CM | POA: Diagnosis not present

## 2017-08-18 DIAGNOSIS — E669 Obesity, unspecified: Secondary | ICD-10-CM | POA: Insufficient documentation

## 2017-08-18 DIAGNOSIS — G4733 Obstructive sleep apnea (adult) (pediatric): Secondary | ICD-10-CM | POA: Insufficient documentation

## 2017-08-18 DIAGNOSIS — J398 Other specified diseases of upper respiratory tract: Secondary | ICD-10-CM | POA: Diagnosis not present

## 2017-08-18 DIAGNOSIS — R51 Headache: Secondary | ICD-10-CM | POA: Diagnosis not present

## 2017-08-18 DIAGNOSIS — Z794 Long term (current) use of insulin: Secondary | ICD-10-CM | POA: Diagnosis not present

## 2017-08-18 LAB — CBC
HCT: 38 % (ref 36.0–46.0)
HEMOGLOBIN: 12.6 g/dL (ref 12.0–15.0)
MCH: 27.8 pg (ref 26.0–34.0)
MCHC: 33.2 g/dL (ref 30.0–36.0)
MCV: 83.9 fL (ref 78.0–100.0)
Platelets: 151 10*3/uL (ref 150–400)
RBC: 4.53 MIL/uL (ref 3.87–5.11)
RDW: 16.1 % — ABNORMAL HIGH (ref 11.5–15.5)
WBC: 4.6 10*3/uL (ref 4.0–10.5)

## 2017-08-18 LAB — BASIC METABOLIC PANEL
ANION GAP: 13 (ref 5–15)
BUN: 54 mg/dL — ABNORMAL HIGH (ref 6–20)
CHLORIDE: 101 mmol/L (ref 101–111)
CO2: 28 mmol/L (ref 22–32)
Calcium: 8.6 mg/dL — ABNORMAL LOW (ref 8.9–10.3)
Creatinine, Ser: 2.25 mg/dL — ABNORMAL HIGH (ref 0.44–1.00)
GFR calc non Af Amer: 21 mL/min — ABNORMAL LOW (ref >60)
GFR, EST AFRICAN AMERICAN: 24 mL/min — AB (ref >60)
Glucose, Bld: 104 mg/dL — ABNORMAL HIGH (ref 65–99)
Potassium: 3.2 mmol/L — ABNORMAL LOW (ref 3.5–5.1)
Sodium: 142 mmol/L (ref 135–145)

## 2017-08-18 MED ORDER — AMLODIPINE BESYLATE 5 MG PO TABS: 5.0000 mg | ORAL_TABLET | Freq: Every day | ORAL | 6 refills | Status: DC

## 2017-08-18 NOTE — Patient Instructions (Signed)
Start Amlodipine 5 mg daily  Labs today  Your physician has requested that you have an echocardiogram. Echocardiography is a painless test that uses sound waves to create images of your heart. It provides your doctor with information about the size and shape of your heart and how well your heart's chambers and valves are working. This procedure takes approximately one hour. There are no restrictions for this procedure.  We will contact you in 6 months to schedule your next appointment.
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
## 2017-08-19 ENCOUNTER — Telehealth (HOSPITAL_COMMUNITY): Payer: Self-pay | Admitting: *Deleted

## 2017-08-19 DIAGNOSIS — I5022 Chronic systolic (congestive) heart failure: Secondary | ICD-10-CM

## 2017-08-19 DIAGNOSIS — E876 Hypokalemia: Secondary | ICD-10-CM

## 2017-08-19 MED ORDER — POTASSIUM CHLORIDE CRYS ER 20 MEQ PO TBCR: 20.0000 meq | EXTENDED_RELEASE_TABLET | Freq: Every day | ORAL | 3 refills | Status: DC

## 2017-08-19 NOTE — Telephone Encounter (Signed)
Notes recorded by Scarlette Calico, RN on 08/19/2017 at 10:07 AM EDT Pt aware and agreeable, she states she has some K-Dur 20 meq tabs at home form when she took them before, she will take 2 today and then 1 daily, repeat lab sch for 08/26/17

## 2017-08-19 NOTE — Telephone Encounter (Signed)
-----   Message from Jolaine Artist, MD sent at 08/18/2017  3:47 PM EDT ----- Add 20 kcl per day. Take 40 the first day. Repeat 1 week

## 2017-08-22 ENCOUNTER — Ambulatory Visit (HOSPITAL_COMMUNITY): Payer: Medicare Other | Attending: Internal Medicine

## 2017-08-22 ENCOUNTER — Other Ambulatory Visit (HOSPITAL_COMMUNITY): Payer: Self-pay | Admitting: Internal Medicine

## 2017-08-22 DIAGNOSIS — I371 Nonrheumatic pulmonary valve insufficiency: Secondary | ICD-10-CM | POA: Insufficient documentation

## 2017-08-22 DIAGNOSIS — I5022 Chronic systolic (congestive) heart failure: Secondary | ICD-10-CM | POA: Insufficient documentation

## 2017-08-22 DIAGNOSIS — I071 Rheumatic tricuspid insufficiency: Secondary | ICD-10-CM | POA: Diagnosis not present

## 2017-08-22 DIAGNOSIS — I272 Pulmonary hypertension, unspecified: Secondary | ICD-10-CM | POA: Diagnosis not present

## 2017-08-22 DIAGNOSIS — N183 Chronic kidney disease, stage 3 (moderate): Secondary | ICD-10-CM | POA: Diagnosis not present

## 2017-08-23 DIAGNOSIS — H40023 Open angle with borderline findings, high risk, bilateral: Secondary | ICD-10-CM | POA: Diagnosis not present

## 2017-08-23 DIAGNOSIS — E119 Type 2 diabetes mellitus without complications: Secondary | ICD-10-CM | POA: Diagnosis not present

## 2017-08-23 DIAGNOSIS — H25013 Cortical age-related cataract, bilateral: Secondary | ICD-10-CM | POA: Diagnosis not present

## 2017-08-23 DIAGNOSIS — H2513 Age-related nuclear cataract, bilateral: Secondary | ICD-10-CM | POA: Diagnosis not present

## 2017-08-24 ENCOUNTER — Other Ambulatory Visit (HOSPITAL_COMMUNITY): Payer: Self-pay | Admitting: Internal Medicine

## 2017-08-26 ENCOUNTER — Ambulatory Visit (HOSPITAL_COMMUNITY)
Admission: RE | Admit: 2017-08-26 | Discharge: 2017-08-26 | Disposition: A | Discharge status: Home / Self Care | Payer: Medicare Other | Source: Ambulatory Visit | Attending: Cardiology | Admitting: Cardiology

## 2017-08-26 DIAGNOSIS — I5022 Chronic systolic (congestive) heart failure: Secondary | ICD-10-CM | POA: Insufficient documentation

## 2017-08-26 DIAGNOSIS — E876 Hypokalemia: Secondary | ICD-10-CM | POA: Insufficient documentation

## 2017-08-26 LAB — BASIC METABOLIC PANEL
Anion gap: 12 (ref 5–15)
BUN: 51 mg/dL — AB (ref 6–20)
CHLORIDE: 104 mmol/L (ref 101–111)
CO2: 28 mmol/L (ref 22–32)
Calcium: 8.2 mg/dL — ABNORMAL LOW (ref 8.9–10.3)
Creatinine, Ser: 2.01 mg/dL — ABNORMAL HIGH (ref 0.44–1.00)
GFR calc Af Amer: 28 mL/min — ABNORMAL LOW (ref >60)
GFR calc non Af Amer: 24 mL/min — ABNORMAL LOW (ref >60)
GLUCOSE: 175 mg/dL — AB (ref 65–99)
Potassium: 4 mmol/L (ref 3.5–5.1)
SODIUM: 144 mmol/L (ref 135–145)

## 2017-08-31 DIAGNOSIS — J189 Pneumonia, unspecified organism: Secondary | ICD-10-CM | POA: Diagnosis not present

## 2017-08-31 DIAGNOSIS — E8770 Fluid overload, unspecified: Secondary | ICD-10-CM | POA: Diagnosis not present

## 2017-08-31 DIAGNOSIS — I129 Hypertensive chronic kidney disease with stage 1 through stage 4 chronic kidney disease, or unspecified chronic kidney disease: Secondary | ICD-10-CM | POA: Diagnosis not present

## 2017-08-31 DIAGNOSIS — Z6841 Body Mass Index (BMI) 40.0 and over, adult: Secondary | ICD-10-CM | POA: Diagnosis not present

## 2017-08-31 DIAGNOSIS — N183 Chronic kidney disease, stage 3 (moderate): Secondary | ICD-10-CM | POA: Diagnosis not present

## 2017-08-31 DIAGNOSIS — G4730 Sleep apnea, unspecified: Secondary | ICD-10-CM | POA: Diagnosis not present

## 2017-08-31 DIAGNOSIS — I2721 Secondary pulmonary arterial hypertension: Secondary | ICD-10-CM | POA: Diagnosis not present

## 2017-08-31 DIAGNOSIS — N2581 Secondary hyperparathyroidism of renal origin: Secondary | ICD-10-CM | POA: Diagnosis not present

## 2017-09-09 ENCOUNTER — Other Ambulatory Visit (HOSPITAL_COMMUNITY): Payer: Self-pay | Admitting: Internal Medicine

## 2017-09-09 DIAGNOSIS — I10 Essential (primary) hypertension: Secondary | ICD-10-CM | POA: Diagnosis not present

## 2017-09-09 DIAGNOSIS — E039 Hypothyroidism, unspecified: Secondary | ICD-10-CM | POA: Diagnosis not present

## 2017-09-09 DIAGNOSIS — E789 Disorder of lipoprotein metabolism, unspecified: Secondary | ICD-10-CM | POA: Diagnosis not present

## 2017-09-09 DIAGNOSIS — E118 Type 2 diabetes mellitus with unspecified complications: Secondary | ICD-10-CM | POA: Diagnosis not present

## 2017-09-20 DIAGNOSIS — H2513 Age-related nuclear cataract, bilateral: Secondary | ICD-10-CM | POA: Diagnosis not present

## 2017-09-20 DIAGNOSIS — H2511 Age-related nuclear cataract, right eye: Secondary | ICD-10-CM | POA: Diagnosis not present

## 2017-09-20 DIAGNOSIS — H40023 Open angle with borderline findings, high risk, bilateral: Secondary | ICD-10-CM | POA: Diagnosis not present

## 2017-09-20 DIAGNOSIS — E119 Type 2 diabetes mellitus without complications: Secondary | ICD-10-CM | POA: Diagnosis not present

## 2017-09-20 DIAGNOSIS — H25013 Cortical age-related cataract, bilateral: Secondary | ICD-10-CM | POA: Diagnosis not present

## 2017-09-21 ENCOUNTER — Telehealth (HOSPITAL_COMMUNITY): Payer: Self-pay | Admitting: *Deleted

## 2017-09-21 ENCOUNTER — Other Ambulatory Visit (HOSPITAL_COMMUNITY): Payer: Self-pay | Admitting: *Deleted

## 2017-09-21 DIAGNOSIS — E118 Type 2 diabetes mellitus with unspecified complications: Secondary | ICD-10-CM | POA: Diagnosis not present

## 2017-09-21 DIAGNOSIS — Z79899 Other long term (current) drug therapy: Secondary | ICD-10-CM | POA: Diagnosis not present

## 2017-09-21 DIAGNOSIS — N184 Chronic kidney disease, stage 4 (severe): Secondary | ICD-10-CM | POA: Diagnosis not present

## 2017-09-21 DIAGNOSIS — Z5181 Encounter for therapeutic drug level monitoring: Secondary | ICD-10-CM | POA: Diagnosis not present

## 2017-09-21 DIAGNOSIS — I10 Essential (primary) hypertension: Secondary | ICD-10-CM | POA: Diagnosis not present

## 2017-09-21 DIAGNOSIS — E039 Hypothyroidism, unspecified: Secondary | ICD-10-CM | POA: Diagnosis not present

## 2017-09-21 MED ORDER — CARVEDILOL 6.25 MG PO TABS: 6.2500 mg | ORAL_TABLET | Freq: Two times a day (BID) | ORAL | 3 refills | Status: DC

## 2017-09-21 MED ORDER — IVABRADINE HCL 7.5 MG PO TABS: 7.5000 mg | ORAL_TABLET | Freq: Two times a day (BID) | ORAL | 5 refills | Status: DC

## 2017-09-21 NOTE — Telephone Encounter (Signed)
Corlanor 7.5 mg PA approved through [place] FEP from 08/22/17 through 09/21/18.

## 2017-09-26 ENCOUNTER — Other Ambulatory Visit (HOSPITAL_COMMUNITY): Payer: Self-pay | Admitting: Internal Medicine

## 2017-09-27 DIAGNOSIS — M1A09X0 Idiopathic chronic gout, multiple sites, without tophus (tophi): Secondary | ICD-10-CM | POA: Diagnosis not present

## 2017-09-27 DIAGNOSIS — M549 Dorsalgia, unspecified: Secondary | ICD-10-CM | POA: Diagnosis not present

## 2017-09-27 DIAGNOSIS — M170 Bilateral primary osteoarthritis of knee: Secondary | ICD-10-CM | POA: Diagnosis not present

## 2017-09-27 DIAGNOSIS — M1712 Unilateral primary osteoarthritis, left knee: Secondary | ICD-10-CM | POA: Diagnosis not present

## 2017-09-27 DIAGNOSIS — M25569 Pain in unspecified knee: Secondary | ICD-10-CM | POA: Diagnosis not present

## 2017-09-27 DIAGNOSIS — M79644 Pain in right finger(s): Secondary | ICD-10-CM | POA: Diagnosis not present

## 2017-09-27 DIAGNOSIS — M25561 Pain in right knee: Secondary | ICD-10-CM | POA: Diagnosis not present

## 2017-09-27 DIAGNOSIS — N184 Chronic kidney disease, stage 4 (severe): Secondary | ICD-10-CM | POA: Diagnosis not present

## 2017-09-27 DIAGNOSIS — M150 Primary generalized (osteo)arthritis: Secondary | ICD-10-CM | POA: Diagnosis not present

## 2017-09-27 DIAGNOSIS — I509 Heart failure, unspecified: Secondary | ICD-10-CM | POA: Diagnosis not present

## 2017-09-27 DIAGNOSIS — M25562 Pain in left knee: Secondary | ICD-10-CM | POA: Diagnosis not present

## 2017-09-28 DIAGNOSIS — I10 Essential (primary) hypertension: Secondary | ICD-10-CM | POA: Diagnosis not present

## 2017-09-28 DIAGNOSIS — E118 Type 2 diabetes mellitus with unspecified complications: Secondary | ICD-10-CM | POA: Diagnosis not present

## 2017-09-28 DIAGNOSIS — E039 Hypothyroidism, unspecified: Secondary | ICD-10-CM | POA: Diagnosis not present

## 2017-09-28 DIAGNOSIS — E7800 Pure hypercholesterolemia, unspecified: Secondary | ICD-10-CM | POA: Diagnosis not present

## 2017-09-28 DIAGNOSIS — G4733 Obstructive sleep apnea (adult) (pediatric): Secondary | ICD-10-CM | POA: Diagnosis not present

## 2017-09-28 DIAGNOSIS — N184 Chronic kidney disease, stage 4 (severe): Secondary | ICD-10-CM | POA: Diagnosis not present

## 2017-09-28 DIAGNOSIS — Z8673 Personal history of transient ischemic attack (TIA), and cerebral infarction without residual deficits: Secondary | ICD-10-CM | POA: Diagnosis not present

## 2017-09-28 DIAGNOSIS — I509 Heart failure, unspecified: Secondary | ICD-10-CM | POA: Diagnosis not present

## 2017-09-30 ENCOUNTER — Other Ambulatory Visit (HOSPITAL_COMMUNITY): Payer: Self-pay | Admitting: Internal Medicine

## 2017-10-05 DIAGNOSIS — Z1231 Encounter for screening mammogram for malignant neoplasm of breast: Secondary | ICD-10-CM | POA: Diagnosis not present

## 2017-10-09 ENCOUNTER — Other Ambulatory Visit (HOSPITAL_COMMUNITY): Payer: Self-pay | Admitting: Internal Medicine

## 2017-10-14 DIAGNOSIS — M25561 Pain in right knee: Secondary | ICD-10-CM | POA: Diagnosis not present

## 2017-10-14 DIAGNOSIS — N184 Chronic kidney disease, stage 4 (severe): Secondary | ICD-10-CM | POA: Diagnosis not present

## 2017-10-14 DIAGNOSIS — E118 Type 2 diabetes mellitus with unspecified complications: Secondary | ICD-10-CM | POA: Diagnosis not present

## 2017-10-14 DIAGNOSIS — M170 Bilateral primary osteoarthritis of knee: Secondary | ICD-10-CM | POA: Diagnosis not present

## 2017-10-14 DIAGNOSIS — M1A09X0 Idiopathic chronic gout, multiple sites, without tophus (tophi): Secondary | ICD-10-CM | POA: Diagnosis not present

## 2017-10-14 DIAGNOSIS — M79644 Pain in right finger(s): Secondary | ICD-10-CM | POA: Diagnosis not present

## 2017-10-14 DIAGNOSIS — I509 Heart failure, unspecified: Secondary | ICD-10-CM | POA: Diagnosis not present

## 2017-10-14 DIAGNOSIS — M150 Primary generalized (osteo)arthritis: Secondary | ICD-10-CM | POA: Diagnosis not present

## 2017-10-14 DIAGNOSIS — M109 Gout, unspecified: Secondary | ICD-10-CM | POA: Diagnosis not present

## 2017-10-14 DIAGNOSIS — M1711 Unilateral primary osteoarthritis, right knee: Secondary | ICD-10-CM | POA: Diagnosis not present

## 2017-10-14 DIAGNOSIS — M1712 Unilateral primary osteoarthritis, left knee: Secondary | ICD-10-CM | POA: Diagnosis not present

## 2017-10-14 DIAGNOSIS — M549 Dorsalgia, unspecified: Secondary | ICD-10-CM | POA: Diagnosis not present

## 2017-10-19 DIAGNOSIS — H2511 Age-related nuclear cataract, right eye: Secondary | ICD-10-CM | POA: Diagnosis not present

## 2017-10-19 DIAGNOSIS — H25811 Combined forms of age-related cataract, right eye: Secondary | ICD-10-CM | POA: Diagnosis not present

## 2017-10-24 DIAGNOSIS — H25012 Cortical age-related cataract, left eye: Secondary | ICD-10-CM | POA: Diagnosis not present

## 2017-10-24 DIAGNOSIS — H2512 Age-related nuclear cataract, left eye: Secondary | ICD-10-CM | POA: Diagnosis not present

## 2017-10-25 DIAGNOSIS — G4733 Obstructive sleep apnea (adult) (pediatric): Secondary | ICD-10-CM | POA: Diagnosis not present

## 2017-10-30 ENCOUNTER — Other Ambulatory Visit (HOSPITAL_COMMUNITY): Payer: Self-pay | Admitting: Internal Medicine

## 2017-11-02 DIAGNOSIS — E039 Hypothyroidism, unspecified: Secondary | ICD-10-CM | POA: Diagnosis not present

## 2017-11-07 DIAGNOSIS — E7800 Pure hypercholesterolemia, unspecified: Secondary | ICD-10-CM | POA: Diagnosis not present

## 2017-11-07 DIAGNOSIS — I509 Heart failure, unspecified: Secondary | ICD-10-CM | POA: Diagnosis not present

## 2017-11-07 DIAGNOSIS — Z8673 Personal history of transient ischemic attack (TIA), and cerebral infarction without residual deficits: Secondary | ICD-10-CM | POA: Diagnosis not present

## 2017-11-07 DIAGNOSIS — E039 Hypothyroidism, unspecified: Secondary | ICD-10-CM | POA: Diagnosis not present

## 2017-11-07 DIAGNOSIS — E118 Type 2 diabetes mellitus with unspecified complications: Secondary | ICD-10-CM | POA: Diagnosis not present

## 2017-11-07 DIAGNOSIS — I10 Essential (primary) hypertension: Secondary | ICD-10-CM | POA: Diagnosis not present

## 2017-11-07 DIAGNOSIS — N184 Chronic kidney disease, stage 4 (severe): Secondary | ICD-10-CM | POA: Diagnosis not present

## 2017-11-07 DIAGNOSIS — G4733 Obstructive sleep apnea (adult) (pediatric): Secondary | ICD-10-CM | POA: Diagnosis not present

## 2017-11-09 DIAGNOSIS — H2512 Age-related nuclear cataract, left eye: Secondary | ICD-10-CM | POA: Diagnosis not present

## 2017-11-09 DIAGNOSIS — H25812 Combined forms of age-related cataract, left eye: Secondary | ICD-10-CM | POA: Diagnosis not present

## 2017-12-21 DIAGNOSIS — M170 Bilateral primary osteoarthritis of knee: Secondary | ICD-10-CM | POA: Diagnosis not present

## 2017-12-21 DIAGNOSIS — M25561 Pain in right knee: Secondary | ICD-10-CM | POA: Diagnosis not present

## 2017-12-21 DIAGNOSIS — E118 Type 2 diabetes mellitus with unspecified complications: Secondary | ICD-10-CM | POA: Diagnosis not present

## 2017-12-21 DIAGNOSIS — M7989 Other specified soft tissue disorders: Secondary | ICD-10-CM | POA: Diagnosis not present

## 2017-12-21 DIAGNOSIS — R2241 Localized swelling, mass and lump, right lower limb: Secondary | ICD-10-CM | POA: Diagnosis not present

## 2017-12-28 ENCOUNTER — Other Ambulatory Visit (HOSPITAL_COMMUNITY): Payer: Self-pay | Admitting: Adult Health

## 2017-12-30 ENCOUNTER — Other Ambulatory Visit (HOSPITAL_COMMUNITY): Payer: Self-pay | Admitting: Internal Medicine

## 2018-01-09 DIAGNOSIS — E8770 Fluid overload, unspecified: Secondary | ICD-10-CM | POA: Diagnosis not present

## 2018-01-09 DIAGNOSIS — G4730 Sleep apnea, unspecified: Secondary | ICD-10-CM | POA: Diagnosis not present

## 2018-01-09 DIAGNOSIS — I129 Hypertensive chronic kidney disease with stage 1 through stage 4 chronic kidney disease, or unspecified chronic kidney disease: Secondary | ICD-10-CM | POA: Diagnosis not present

## 2018-01-09 DIAGNOSIS — I2721 Secondary pulmonary arterial hypertension: Secondary | ICD-10-CM | POA: Diagnosis not present

## 2018-01-09 DIAGNOSIS — Z6841 Body Mass Index (BMI) 40.0 and over, adult: Secondary | ICD-10-CM | POA: Diagnosis not present

## 2018-01-09 DIAGNOSIS — N2581 Secondary hyperparathyroidism of renal origin: Secondary | ICD-10-CM | POA: Diagnosis not present

## 2018-01-09 DIAGNOSIS — N183 Chronic kidney disease, stage 3 (moderate): Secondary | ICD-10-CM | POA: Diagnosis not present

## 2018-01-15 ENCOUNTER — Encounter (HOSPITAL_COMMUNITY): Payer: Self-pay

## 2018-01-15 ENCOUNTER — Ambulatory Visit (HOSPITAL_COMMUNITY)
Admission: EM | Admit: 2018-01-15 | Discharge: 2018-01-15 | Disposition: A | Discharge status: Home / Self Care | Payer: Medicare Other | Attending: Internal Medicine | Admitting: Internal Medicine

## 2018-01-15 DIAGNOSIS — M5431 Sciatica, right side: Secondary | ICD-10-CM

## 2018-01-15 MED ORDER — METHYLPREDNISOLONE ACETATE 40 MG/ML IJ SUSP
40.0000 mg | Freq: Once | INTRAMUSCULAR | Status: AC
  Administered 2018-01-15: 40 mg via INTRAMUSCULAR

## 2018-01-15 MED ORDER — METHYLPREDNISOLONE ACETATE 40 MG/ML IJ SUSP
INTRAMUSCULAR | Status: AC
  Filled 2018-01-15: qty 1

## 2018-01-15 NOTE — Discharge Instructions (Signed)
We have given you a shot of steroid here today.  Please monitor your blood sugar as this medication can cause it to increase.  Continue with tylenol regularly for pain.  May use your hydrocodone for break through pain.  Use of your skelaxin or valium as needed for muscle spasms.  Light and regular activity as tolerated.  Please follow up with your primary care provider for recheck and further evaluation and treatment as needed.
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
## 2018-01-15 NOTE — ED Triage Notes (Signed)
Pt presents with pain in back that radiates down to her right hip and leg.

## 2018-01-17 ENCOUNTER — Other Ambulatory Visit (HOSPITAL_COMMUNITY): Payer: Self-pay | Admitting: Internal Medicine

## 2018-01-17 DIAGNOSIS — G8929 Other chronic pain: Secondary | ICD-10-CM | POA: Diagnosis not present

## 2018-01-17 DIAGNOSIS — I10 Essential (primary) hypertension: Secondary | ICD-10-CM | POA: Diagnosis not present

## 2018-01-17 DIAGNOSIS — M150 Primary generalized (osteo)arthritis: Secondary | ICD-10-CM | POA: Diagnosis not present

## 2018-01-17 DIAGNOSIS — M545 Low back pain: Secondary | ICD-10-CM | POA: Diagnosis not present

## 2018-01-27 DIAGNOSIS — H1132 Conjunctival hemorrhage, left eye: Secondary | ICD-10-CM | POA: Diagnosis not present

## 2018-01-30 ENCOUNTER — Telehealth (HOSPITAL_COMMUNITY): Payer: Self-pay

## 2018-01-30 ENCOUNTER — Other Ambulatory Visit (HOSPITAL_COMMUNITY): Payer: Self-pay | Admitting: Internal Medicine

## 2018-01-30 NOTE — Telephone Encounter (Signed)
   Please call her back and ask her to call PCP for an evaluation.   Arista Kettlewell  NP_C  12:43 PM

## 2018-01-30 NOTE — Telephone Encounter (Signed)
Pt called and stated that she is having numbness in her right thigh from her knee to her hip for 1 week. Pt also states that she recently came off her prednisone.

## 2018-01-30 NOTE — Telephone Encounter (Signed)
Pt.notified

## 2018-01-31 DIAGNOSIS — G629 Polyneuropathy, unspecified: Secondary | ICD-10-CM | POA: Diagnosis not present

## 2018-01-31 DIAGNOSIS — I10 Essential (primary) hypertension: Secondary | ICD-10-CM | POA: Diagnosis not present

## 2018-01-31 DIAGNOSIS — E118 Type 2 diabetes mellitus with unspecified complications: Secondary | ICD-10-CM | POA: Diagnosis not present

## 2018-01-31 DIAGNOSIS — M545 Low back pain: Secondary | ICD-10-CM | POA: Diagnosis not present

## 2018-02-02 DIAGNOSIS — E039 Hypothyroidism, unspecified: Secondary | ICD-10-CM | POA: Diagnosis not present

## 2018-02-02 DIAGNOSIS — E118 Type 2 diabetes mellitus with unspecified complications: Secondary | ICD-10-CM | POA: Diagnosis not present

## 2018-02-07 DIAGNOSIS — Z8673 Personal history of transient ischemic attack (TIA), and cerebral infarction without residual deficits: Secondary | ICD-10-CM | POA: Diagnosis not present

## 2018-02-07 DIAGNOSIS — G4733 Obstructive sleep apnea (adult) (pediatric): Secondary | ICD-10-CM | POA: Diagnosis not present

## 2018-02-07 DIAGNOSIS — N184 Chronic kidney disease, stage 4 (severe): Secondary | ICD-10-CM | POA: Diagnosis not present

## 2018-02-07 DIAGNOSIS — E039 Hypothyroidism, unspecified: Secondary | ICD-10-CM | POA: Diagnosis not present

## 2018-02-07 DIAGNOSIS — I10 Essential (primary) hypertension: Secondary | ICD-10-CM | POA: Diagnosis not present

## 2018-02-07 DIAGNOSIS — I509 Heart failure, unspecified: Secondary | ICD-10-CM | POA: Diagnosis not present

## 2018-02-07 DIAGNOSIS — E7800 Pure hypercholesterolemia, unspecified: Secondary | ICD-10-CM | POA: Diagnosis not present

## 2018-02-07 DIAGNOSIS — E118 Type 2 diabetes mellitus with unspecified complications: Secondary | ICD-10-CM | POA: Diagnosis not present

## 2018-02-10 ENCOUNTER — Other Ambulatory Visit (HOSPITAL_COMMUNITY): Payer: Self-pay | Admitting: Internal Medicine

## 2018-02-13 ENCOUNTER — Other Ambulatory Visit (HOSPITAL_COMMUNITY): Payer: Self-pay | Admitting: Pharmacist

## 2018-02-13 MED ORDER — CARVEDILOL 6.25 MG PO TABS: 6.2500 mg | ORAL_TABLET | Freq: Two times a day (BID) | ORAL | 3 refills | Status: DC

## 2018-02-17 ENCOUNTER — Encounter: Payer: Self-pay | Admitting: *Deleted

## 2018-02-20 ENCOUNTER — Encounter: Payer: Self-pay | Admitting: Diagnostic Neuroimaging

## 2018-02-20 ENCOUNTER — Ambulatory Visit (INDEPENDENT_AMBULATORY_CARE_PROVIDER_SITE_OTHER): Payer: Medicare Other | Admitting: Diagnostic Neuroimaging

## 2018-02-20 VITALS — BP 156/81 | HR 73 | Ht 62.0 in | Wt 235.4 lb

## 2018-02-20 DIAGNOSIS — I2510 Atherosclerotic heart disease of native coronary artery without angina pectoris: Secondary | ICD-10-CM | POA: Diagnosis not present

## 2018-02-20 DIAGNOSIS — M5441 Lumbago with sciatica, right side: Secondary | ICD-10-CM | POA: Diagnosis not present

## 2018-02-20 DIAGNOSIS — M25562 Pain in left knee: Secondary | ICD-10-CM

## 2018-02-20 DIAGNOSIS — M25561 Pain in right knee: Secondary | ICD-10-CM

## 2018-02-20 DIAGNOSIS — G8929 Other chronic pain: Secondary | ICD-10-CM

## 2018-02-20 DIAGNOSIS — M5442 Lumbago with sciatica, left side: Secondary | ICD-10-CM | POA: Diagnosis not present

## 2018-02-20 NOTE — Patient Instructions (Signed)
-   consider MRI lumbar spine  - consider PT evaluation  - consider pain mgmt clinic
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
## 2018-02-27 ENCOUNTER — Other Ambulatory Visit (HOSPITAL_COMMUNITY): Payer: Self-pay | Admitting: Internal Medicine

## 2018-02-28 ENCOUNTER — Other Ambulatory Visit (HOSPITAL_COMMUNITY): Payer: Self-pay | Admitting: Internal Medicine

## 2018-03-27 DIAGNOSIS — Z79899 Other long term (current) drug therapy: Secondary | ICD-10-CM | POA: Diagnosis not present

## 2018-03-27 DIAGNOSIS — M47817 Spondylosis without myelopathy or radiculopathy, lumbosacral region: Secondary | ICD-10-CM | POA: Diagnosis not present

## 2018-03-27 DIAGNOSIS — Z79891 Long term (current) use of opiate analgesic: Secondary | ICD-10-CM | POA: Diagnosis not present

## 2018-03-27 DIAGNOSIS — M79651 Pain in right thigh: Secondary | ICD-10-CM | POA: Diagnosis not present

## 2018-03-27 DIAGNOSIS — G894 Chronic pain syndrome: Secondary | ICD-10-CM | POA: Diagnosis not present

## 2018-03-28 ENCOUNTER — Other Ambulatory Visit: Payer: Self-pay | Admitting: Pain Medicine

## 2018-03-28 DIAGNOSIS — M545 Low back pain, unspecified: Secondary | ICD-10-CM

## 2018-03-28 DIAGNOSIS — Z79899 Other long term (current) drug therapy: Secondary | ICD-10-CM | POA: Diagnosis not present

## 2018-03-28 DIAGNOSIS — E118 Type 2 diabetes mellitus with unspecified complications: Secondary | ICD-10-CM | POA: Diagnosis not present

## 2018-03-28 DIAGNOSIS — I10 Essential (primary) hypertension: Secondary | ICD-10-CM | POA: Diagnosis not present

## 2018-03-28 DIAGNOSIS — M79604 Pain in right leg: Secondary | ICD-10-CM

## 2018-03-29 DIAGNOSIS — G629 Polyneuropathy, unspecified: Secondary | ICD-10-CM | POA: Diagnosis not present

## 2018-03-29 DIAGNOSIS — E118 Type 2 diabetes mellitus with unspecified complications: Secondary | ICD-10-CM | POA: Diagnosis not present

## 2018-03-29 DIAGNOSIS — E7800 Pure hypercholesterolemia, unspecified: Secondary | ICD-10-CM | POA: Diagnosis not present

## 2018-03-29 DIAGNOSIS — M545 Low back pain: Secondary | ICD-10-CM | POA: Diagnosis not present

## 2018-03-29 DIAGNOSIS — I10 Essential (primary) hypertension: Secondary | ICD-10-CM | POA: Diagnosis not present

## 2018-03-29 DIAGNOSIS — E039 Hypothyroidism, unspecified: Secondary | ICD-10-CM | POA: Diagnosis not present

## 2018-03-29 DIAGNOSIS — G8929 Other chronic pain: Secondary | ICD-10-CM | POA: Diagnosis not present

## 2018-03-29 DIAGNOSIS — N184 Chronic kidney disease, stage 4 (severe): Secondary | ICD-10-CM | POA: Diagnosis not present

## 2018-04-03 ENCOUNTER — Ambulatory Visit
Admission: RE | Admit: 2018-04-03 | Discharge: 2018-04-03 | Disposition: A | Discharge status: Home / Self Care | Payer: Medicare Other | Source: Ambulatory Visit | Attending: Pain Medicine | Admitting: Pain Medicine

## 2018-04-03 DIAGNOSIS — M48061 Spinal stenosis, lumbar region without neurogenic claudication: Secondary | ICD-10-CM | POA: Diagnosis not present

## 2018-04-03 DIAGNOSIS — M79604 Pain in right leg: Secondary | ICD-10-CM

## 2018-04-03 DIAGNOSIS — M545 Low back pain, unspecified: Secondary | ICD-10-CM

## 2018-04-04 DIAGNOSIS — M792 Neuralgia and neuritis, unspecified: Secondary | ICD-10-CM | POA: Diagnosis not present

## 2018-04-04 DIAGNOSIS — G609 Hereditary and idiopathic neuropathy, unspecified: Secondary | ICD-10-CM | POA: Diagnosis not present

## 2018-04-04 DIAGNOSIS — F4542 Pain disorder with related psychological factors: Secondary | ICD-10-CM | POA: Diagnosis not present

## 2018-04-04 DIAGNOSIS — G894 Chronic pain syndrome: Secondary | ICD-10-CM | POA: Diagnosis not present

## 2018-04-05 ENCOUNTER — Other Ambulatory Visit (HOSPITAL_COMMUNITY): Payer: Self-pay | Admitting: Internal Medicine

## 2018-04-14 DIAGNOSIS — M150 Primary generalized (osteo)arthritis: Secondary | ICD-10-CM | POA: Diagnosis not present

## 2018-04-14 DIAGNOSIS — N184 Chronic kidney disease, stage 4 (severe): Secondary | ICD-10-CM | POA: Diagnosis not present

## 2018-04-14 DIAGNOSIS — M109 Gout, unspecified: Secondary | ICD-10-CM | POA: Diagnosis not present

## 2018-04-14 DIAGNOSIS — I509 Heart failure, unspecified: Secondary | ICD-10-CM | POA: Diagnosis not present

## 2018-04-14 DIAGNOSIS — M170 Bilateral primary osteoarthritis of knee: Secondary | ICD-10-CM | POA: Diagnosis not present

## 2018-04-14 DIAGNOSIS — M25561 Pain in right knee: Secondary | ICD-10-CM | POA: Diagnosis not present

## 2018-04-14 DIAGNOSIS — M79644 Pain in right finger(s): Secondary | ICD-10-CM | POA: Diagnosis not present

## 2018-04-14 DIAGNOSIS — M1712 Unilateral primary osteoarthritis, left knee: Secondary | ICD-10-CM | POA: Diagnosis not present

## 2018-04-14 DIAGNOSIS — M1A09X0 Idiopathic chronic gout, multiple sites, without tophus (tophi): Secondary | ICD-10-CM | POA: Diagnosis not present

## 2018-04-14 DIAGNOSIS — M549 Dorsalgia, unspecified: Secondary | ICD-10-CM | POA: Diagnosis not present

## 2018-04-14 DIAGNOSIS — M1711 Unilateral primary osteoarthritis, right knee: Secondary | ICD-10-CM | POA: Diagnosis not present

## 2018-04-24 DIAGNOSIS — M5126 Other intervertebral disc displacement, lumbar region: Secondary | ICD-10-CM | POA: Diagnosis not present

## 2018-04-24 DIAGNOSIS — M5136 Other intervertebral disc degeneration, lumbar region: Secondary | ICD-10-CM | POA: Diagnosis not present

## 2018-04-24 DIAGNOSIS — M47817 Spondylosis without myelopathy or radiculopathy, lumbosacral region: Secondary | ICD-10-CM | POA: Diagnosis not present

## 2018-04-24 DIAGNOSIS — G894 Chronic pain syndrome: Secondary | ICD-10-CM | POA: Diagnosis not present

## 2018-04-28 DIAGNOSIS — H40023 Open angle with borderline findings, high risk, bilateral: Secondary | ICD-10-CM | POA: Diagnosis not present

## 2018-04-28 DIAGNOSIS — Z961 Presence of intraocular lens: Secondary | ICD-10-CM | POA: Diagnosis not present

## 2018-04-28 DIAGNOSIS — E119 Type 2 diabetes mellitus without complications: Secondary | ICD-10-CM | POA: Diagnosis not present

## 2018-04-28 DIAGNOSIS — H35033 Hypertensive retinopathy, bilateral: Secondary | ICD-10-CM | POA: Diagnosis not present

## 2018-05-08 DIAGNOSIS — M5136 Other intervertebral disc degeneration, lumbar region: Secondary | ICD-10-CM | POA: Diagnosis not present

## 2018-05-08 DIAGNOSIS — M47817 Spondylosis without myelopathy or radiculopathy, lumbosacral region: Secondary | ICD-10-CM | POA: Diagnosis not present

## 2018-05-08 DIAGNOSIS — M5126 Other intervertebral disc displacement, lumbar region: Secondary | ICD-10-CM | POA: Diagnosis not present

## 2018-05-10 DIAGNOSIS — D649 Anemia, unspecified: Secondary | ICD-10-CM | POA: Diagnosis not present

## 2018-05-10 DIAGNOSIS — I129 Hypertensive chronic kidney disease with stage 1 through stage 4 chronic kidney disease, or unspecified chronic kidney disease: Secondary | ICD-10-CM | POA: Diagnosis not present

## 2018-05-10 DIAGNOSIS — E1122 Type 2 diabetes mellitus with diabetic chronic kidney disease: Secondary | ICD-10-CM | POA: Diagnosis not present

## 2018-05-10 DIAGNOSIS — I2721 Secondary pulmonary arterial hypertension: Secondary | ICD-10-CM | POA: Diagnosis not present

## 2018-05-10 DIAGNOSIS — N183 Chronic kidney disease, stage 3 (moderate): Secondary | ICD-10-CM | POA: Diagnosis not present

## 2018-05-15 DIAGNOSIS — N184 Chronic kidney disease, stage 4 (severe): Secondary | ICD-10-CM | POA: Diagnosis not present

## 2018-05-15 DIAGNOSIS — E118 Type 2 diabetes mellitus with unspecified complications: Secondary | ICD-10-CM | POA: Diagnosis not present

## 2018-05-15 DIAGNOSIS — E039 Hypothyroidism, unspecified: Secondary | ICD-10-CM | POA: Diagnosis not present

## 2018-05-16 DIAGNOSIS — I509 Heart failure, unspecified: Secondary | ICD-10-CM | POA: Diagnosis not present

## 2018-05-16 DIAGNOSIS — I10 Essential (primary) hypertension: Secondary | ICD-10-CM | POA: Diagnosis not present

## 2018-05-16 DIAGNOSIS — Z8673 Personal history of transient ischemic attack (TIA), and cerebral infarction without residual deficits: Secondary | ICD-10-CM | POA: Diagnosis not present

## 2018-05-16 DIAGNOSIS — G4733 Obstructive sleep apnea (adult) (pediatric): Secondary | ICD-10-CM | POA: Diagnosis not present

## 2018-05-16 DIAGNOSIS — E118 Type 2 diabetes mellitus with unspecified complications: Secondary | ICD-10-CM | POA: Diagnosis not present

## 2018-05-16 DIAGNOSIS — E039 Hypothyroidism, unspecified: Secondary | ICD-10-CM | POA: Diagnosis not present

## 2018-05-16 DIAGNOSIS — N184 Chronic kidney disease, stage 4 (severe): Secondary | ICD-10-CM | POA: Diagnosis not present

## 2018-05-16 DIAGNOSIS — E7800 Pure hypercholesterolemia, unspecified: Secondary | ICD-10-CM | POA: Diagnosis not present

## 2018-05-25 ENCOUNTER — Other Ambulatory Visit: Payer: Self-pay | Admitting: *Deleted

## 2018-05-25 MED ORDER — TORSEMIDE 100 MG PO TABS: ORAL_TABLET | ORAL | 4 refills | Status: DC

## 2018-05-25 NOTE — Telephone Encounter (Signed)
Pt needs follow up appt [phone] for future refills

## 2018-06-04 ENCOUNTER — Other Ambulatory Visit (HOSPITAL_COMMUNITY): Payer: Self-pay | Admitting: Internal Medicine

## 2018-06-05 DIAGNOSIS — M5136 Other intervertebral disc degeneration, lumbar region: Secondary | ICD-10-CM | POA: Diagnosis not present

## 2018-06-05 DIAGNOSIS — M5126 Other intervertebral disc displacement, lumbar region: Secondary | ICD-10-CM | POA: Diagnosis not present

## 2018-06-05 DIAGNOSIS — M47817 Spondylosis without myelopathy or radiculopathy, lumbosacral region: Secondary | ICD-10-CM | POA: Diagnosis not present

## 2018-06-05 NOTE — Telephone Encounter (Signed)
Pt needs appt for refills [phone]

## 2018-06-06 DIAGNOSIS — G4733 Obstructive sleep apnea (adult) (pediatric): Secondary | ICD-10-CM | POA: Diagnosis not present

## 2018-06-06 DIAGNOSIS — E039 Hypothyroidism, unspecified: Secondary | ICD-10-CM | POA: Diagnosis not present

## 2018-06-06 DIAGNOSIS — E7800 Pure hypercholesterolemia, unspecified: Secondary | ICD-10-CM | POA: Diagnosis not present

## 2018-06-06 DIAGNOSIS — N184 Chronic kidney disease, stage 4 (severe): Secondary | ICD-10-CM | POA: Diagnosis not present

## 2018-06-06 DIAGNOSIS — E118 Type 2 diabetes mellitus with unspecified complications: Secondary | ICD-10-CM | POA: Diagnosis not present

## 2018-06-06 DIAGNOSIS — I509 Heart failure, unspecified: Secondary | ICD-10-CM | POA: Diagnosis not present

## 2018-06-06 DIAGNOSIS — I10 Essential (primary) hypertension: Secondary | ICD-10-CM | POA: Diagnosis not present

## 2018-06-06 DIAGNOSIS — Z8673 Personal history of transient ischemic attack (TIA), and cerebral infarction without residual deficits: Secondary | ICD-10-CM | POA: Diagnosis not present

## 2018-06-13 DIAGNOSIS — R200 Anesthesia of skin: Secondary | ICD-10-CM | POA: Diagnosis not present

## 2018-06-13 DIAGNOSIS — M5416 Radiculopathy, lumbar region: Secondary | ICD-10-CM | POA: Diagnosis not present

## 2018-06-27 DIAGNOSIS — I10 Essential (primary) hypertension: Secondary | ICD-10-CM | POA: Diagnosis not present

## 2018-06-27 DIAGNOSIS — E039 Hypothyroidism, unspecified: Secondary | ICD-10-CM | POA: Diagnosis not present

## 2018-06-27 DIAGNOSIS — E118 Type 2 diabetes mellitus with unspecified complications: Secondary | ICD-10-CM | POA: Diagnosis not present

## 2018-07-03 ENCOUNTER — Other Ambulatory Visit: Payer: Self-pay | Admitting: Pain Medicine

## 2018-07-03 ENCOUNTER — Ambulatory Visit
Admission: RE | Admit: 2018-07-03 | Discharge: 2018-07-03 | Disposition: A | Discharge status: Home / Self Care | Payer: Medicare Other | Source: Ambulatory Visit | Attending: Pain Medicine | Admitting: Pain Medicine

## 2018-07-03 DIAGNOSIS — N184 Chronic kidney disease, stage 4 (severe): Secondary | ICD-10-CM | POA: Diagnosis not present

## 2018-07-03 DIAGNOSIS — G894 Chronic pain syndrome: Secondary | ICD-10-CM | POA: Diagnosis not present

## 2018-07-03 DIAGNOSIS — G8929 Other chronic pain: Secondary | ICD-10-CM | POA: Diagnosis not present

## 2018-07-03 DIAGNOSIS — M5126 Other intervertebral disc displacement, lumbar region: Secondary | ICD-10-CM | POA: Diagnosis not present

## 2018-07-03 DIAGNOSIS — F419 Anxiety disorder, unspecified: Secondary | ICD-10-CM | POA: Diagnosis not present

## 2018-07-03 DIAGNOSIS — M5136 Other intervertebral disc degeneration, lumbar region: Secondary | ICD-10-CM | POA: Diagnosis not present

## 2018-07-03 DIAGNOSIS — M25551 Pain in right hip: Secondary | ICD-10-CM

## 2018-07-03 DIAGNOSIS — E118 Type 2 diabetes mellitus with unspecified complications: Secondary | ICD-10-CM | POA: Diagnosis not present

## 2018-07-03 DIAGNOSIS — Z79891 Long term (current) use of opiate analgesic: Secondary | ICD-10-CM | POA: Diagnosis not present

## 2018-07-03 DIAGNOSIS — I509 Heart failure, unspecified: Secondary | ICD-10-CM | POA: Diagnosis not present

## 2018-07-03 DIAGNOSIS — M1A09X0 Idiopathic chronic gout, multiple sites, without tophus (tophi): Secondary | ICD-10-CM | POA: Diagnosis not present

## 2018-07-03 DIAGNOSIS — Z0000 Encounter for general adult medical examination without abnormal findings: Secondary | ICD-10-CM | POA: Diagnosis not present

## 2018-07-03 DIAGNOSIS — M47817 Spondylosis without myelopathy or radiculopathy, lumbosacral region: Secondary | ICD-10-CM | POA: Diagnosis not present

## 2018-07-03 DIAGNOSIS — M1611 Unilateral primary osteoarthritis, right hip: Secondary | ICD-10-CM | POA: Diagnosis not present

## 2018-07-03 DIAGNOSIS — Z79899 Other long term (current) drug therapy: Secondary | ICD-10-CM | POA: Diagnosis not present

## 2018-07-03 DIAGNOSIS — K219 Gastro-esophageal reflux disease without esophagitis: Secondary | ICD-10-CM | POA: Diagnosis not present

## 2018-07-03 DIAGNOSIS — I10 Essential (primary) hypertension: Secondary | ICD-10-CM | POA: Diagnosis not present

## 2018-07-07 ENCOUNTER — Other Ambulatory Visit (HOSPITAL_COMMUNITY): Payer: Self-pay | Admitting: Internal Medicine

## 2018-07-08 DIAGNOSIS — Z1211 Encounter for screening for malignant neoplasm of colon: Secondary | ICD-10-CM | POA: Diagnosis not present

## 2018-07-08 DIAGNOSIS — Z1212 Encounter for screening for malignant neoplasm of rectum: Secondary | ICD-10-CM | POA: Diagnosis not present

## 2018-07-11 ENCOUNTER — Other Ambulatory Visit (HOSPITAL_COMMUNITY): Payer: Self-pay | Admitting: Internal Medicine

## 2018-07-11 DIAGNOSIS — Z6841 Body Mass Index (BMI) 40.0 and over, adult: Secondary | ICD-10-CM | POA: Diagnosis not present

## 2018-07-11 DIAGNOSIS — M545 Low back pain: Secondary | ICD-10-CM | POA: Diagnosis not present

## 2018-08-07 ENCOUNTER — Other Ambulatory Visit (HOSPITAL_COMMUNITY): Payer: Self-pay | Admitting: Internal Medicine

## 2018-08-28 DIAGNOSIS — M47817 Spondylosis without myelopathy or radiculopathy, lumbosacral region: Secondary | ICD-10-CM | POA: Diagnosis not present

## 2018-09-03 ENCOUNTER — Other Ambulatory Visit (HOSPITAL_COMMUNITY): Payer: Self-pay | Admitting: Internal Medicine

## 2018-09-04 ENCOUNTER — Other Ambulatory Visit (HOSPITAL_COMMUNITY): Payer: Self-pay | Admitting: Internal Medicine

## 2018-09-10 ENCOUNTER — Other Ambulatory Visit (HOSPITAL_COMMUNITY): Payer: Self-pay | Admitting: Internal Medicine

## 2018-09-14 ENCOUNTER — Other Ambulatory Visit (HOSPITAL_COMMUNITY): Payer: Self-pay | Admitting: Internal Medicine

## 2018-09-25 DIAGNOSIS — Z79899 Other long term (current) drug therapy: Secondary | ICD-10-CM | POA: Diagnosis not present

## 2018-09-25 DIAGNOSIS — M545 Low back pain: Secondary | ICD-10-CM | POA: Diagnosis not present

## 2018-09-25 DIAGNOSIS — G894 Chronic pain syndrome: Secondary | ICD-10-CM | POA: Diagnosis not present

## 2018-09-25 DIAGNOSIS — Z79891 Long term (current) use of opiate analgesic: Secondary | ICD-10-CM | POA: Diagnosis not present

## 2018-09-25 DIAGNOSIS — M47816 Spondylosis without myelopathy or radiculopathy, lumbar region: Secondary | ICD-10-CM | POA: Diagnosis not present

## 2018-10-04 ENCOUNTER — Other Ambulatory Visit (HOSPITAL_COMMUNITY): Payer: Self-pay | Admitting: Internal Medicine

## 2018-10-13 ENCOUNTER — Other Ambulatory Visit (HOSPITAL_COMMUNITY): Payer: Self-pay | Admitting: Internal Medicine

## 2018-10-14 DIAGNOSIS — Z1231 Encounter for screening mammogram for malignant neoplasm of breast: Secondary | ICD-10-CM | POA: Diagnosis not present

## 2018-10-20 DIAGNOSIS — R110 Nausea: Secondary | ICD-10-CM | POA: Diagnosis not present

## 2018-10-20 DIAGNOSIS — F419 Anxiety disorder, unspecified: Secondary | ICD-10-CM | POA: Diagnosis not present

## 2018-10-20 DIAGNOSIS — K219 Gastro-esophageal reflux disease without esophagitis: Secondary | ICD-10-CM | POA: Diagnosis not present

## 2018-10-24 DIAGNOSIS — I2721 Secondary pulmonary arterial hypertension: Secondary | ICD-10-CM | POA: Diagnosis not present

## 2018-10-24 DIAGNOSIS — N2581 Secondary hyperparathyroidism of renal origin: Secondary | ICD-10-CM | POA: Diagnosis not present

## 2018-10-24 DIAGNOSIS — I129 Hypertensive chronic kidney disease with stage 1 through stage 4 chronic kidney disease, or unspecified chronic kidney disease: Secondary | ICD-10-CM | POA: Diagnosis not present

## 2018-10-24 DIAGNOSIS — E1122 Type 2 diabetes mellitus with diabetic chronic kidney disease: Secondary | ICD-10-CM | POA: Diagnosis not present

## 2018-10-24 DIAGNOSIS — N183 Chronic kidney disease, stage 3 (moderate): Secondary | ICD-10-CM | POA: Diagnosis not present

## 2018-10-24 DIAGNOSIS — D649 Anemia, unspecified: Secondary | ICD-10-CM | POA: Diagnosis not present

## 2018-10-25 DIAGNOSIS — M5136 Other intervertebral disc degeneration, lumbar region: Secondary | ICD-10-CM | POA: Diagnosis not present

## 2018-10-25 DIAGNOSIS — M47817 Spondylosis without myelopathy or radiculopathy, lumbosacral region: Secondary | ICD-10-CM | POA: Diagnosis not present

## 2018-10-25 DIAGNOSIS — M5126 Other intervertebral disc displacement, lumbar region: Secondary | ICD-10-CM | POA: Diagnosis not present

## 2018-10-25 DIAGNOSIS — G894 Chronic pain syndrome: Secondary | ICD-10-CM | POA: Diagnosis not present

## 2018-10-29 ENCOUNTER — Other Ambulatory Visit (HOSPITAL_COMMUNITY): Payer: Self-pay | Admitting: Internal Medicine

## 2018-10-30 ENCOUNTER — Other Ambulatory Visit: Payer: Self-pay | Admitting: Internal Medicine

## 2018-10-31 DIAGNOSIS — E039 Hypothyroidism, unspecified: Secondary | ICD-10-CM | POA: Diagnosis not present

## 2018-10-31 DIAGNOSIS — E7800 Pure hypercholesterolemia, unspecified: Secondary | ICD-10-CM | POA: Diagnosis not present

## 2018-10-31 DIAGNOSIS — Z8673 Personal history of transient ischemic attack (TIA), and cerebral infarction without residual deficits: Secondary | ICD-10-CM | POA: Diagnosis not present

## 2018-10-31 DIAGNOSIS — Z1321 Encounter for screening for nutritional disorder: Secondary | ICD-10-CM | POA: Diagnosis not present

## 2018-10-31 DIAGNOSIS — N184 Chronic kidney disease, stage 4 (severe): Secondary | ICD-10-CM | POA: Diagnosis not present

## 2018-10-31 DIAGNOSIS — E118 Type 2 diabetes mellitus with unspecified complications: Secondary | ICD-10-CM | POA: Diagnosis not present

## 2018-10-31 DIAGNOSIS — Z7189 Other specified counseling: Secondary | ICD-10-CM | POA: Diagnosis not present

## 2018-10-31 DIAGNOSIS — I10 Essential (primary) hypertension: Secondary | ICD-10-CM | POA: Diagnosis not present

## 2018-10-31 DIAGNOSIS — G4733 Obstructive sleep apnea (adult) (pediatric): Secondary | ICD-10-CM | POA: Diagnosis not present

## 2018-10-31 DIAGNOSIS — I509 Heart failure, unspecified: Secondary | ICD-10-CM | POA: Diagnosis not present

## 2018-11-01 ENCOUNTER — Other Ambulatory Visit (HOSPITAL_COMMUNITY): Payer: Self-pay | Admitting: Internal Medicine

## 2018-11-07 ENCOUNTER — Other Ambulatory Visit (HOSPITAL_COMMUNITY): Payer: Self-pay | Admitting: Internal Medicine

## 2018-11-22 DIAGNOSIS — M5416 Radiculopathy, lumbar region: Secondary | ICD-10-CM | POA: Diagnosis not present

## 2018-11-22 DIAGNOSIS — M5136 Other intervertebral disc degeneration, lumbar region: Secondary | ICD-10-CM | POA: Diagnosis not present

## 2018-11-22 DIAGNOSIS — M47817 Spondylosis without myelopathy or radiculopathy, lumbosacral region: Secondary | ICD-10-CM | POA: Diagnosis not present

## 2018-11-22 DIAGNOSIS — M5126 Other intervertebral disc displacement, lumbar region: Secondary | ICD-10-CM | POA: Diagnosis not present

## 2018-11-27 ENCOUNTER — Other Ambulatory Visit (HOSPITAL_COMMUNITY): Payer: Self-pay | Admitting: Internal Medicine

## 2018-11-29 DIAGNOSIS — M170 Bilateral primary osteoarthritis of knee: Secondary | ICD-10-CM | POA: Diagnosis not present

## 2018-11-29 DIAGNOSIS — M150 Primary generalized (osteo)arthritis: Secondary | ICD-10-CM | POA: Diagnosis not present

## 2018-11-29 DIAGNOSIS — M109 Gout, unspecified: Secondary | ICD-10-CM | POA: Diagnosis not present

## 2018-11-29 DIAGNOSIS — M25561 Pain in right knee: Secondary | ICD-10-CM | POA: Diagnosis not present

## 2018-11-29 DIAGNOSIS — M79644 Pain in right finger(s): Secondary | ICD-10-CM | POA: Diagnosis not present

## 2018-11-29 DIAGNOSIS — M549 Dorsalgia, unspecified: Secondary | ICD-10-CM | POA: Diagnosis not present

## 2018-11-29 DIAGNOSIS — I509 Heart failure, unspecified: Secondary | ICD-10-CM | POA: Diagnosis not present

## 2018-11-29 DIAGNOSIS — M1712 Unilateral primary osteoarthritis, left knee: Secondary | ICD-10-CM | POA: Diagnosis not present

## 2018-11-29 DIAGNOSIS — N184 Chronic kidney disease, stage 4 (severe): Secondary | ICD-10-CM | POA: Diagnosis not present

## 2018-11-29 DIAGNOSIS — M1A09X0 Idiopathic chronic gout, multiple sites, without tophus (tophi): Secondary | ICD-10-CM | POA: Diagnosis not present

## 2018-11-30 DIAGNOSIS — L821 Other seborrheic keratosis: Secondary | ICD-10-CM | POA: Diagnosis not present

## 2018-11-30 DIAGNOSIS — L281 Prurigo nodularis: Secondary | ICD-10-CM | POA: Diagnosis not present

## 2018-11-30 DIAGNOSIS — L918 Other hypertrophic disorders of the skin: Secondary | ICD-10-CM | POA: Diagnosis not present

## 2018-12-03 ENCOUNTER — Other Ambulatory Visit (HOSPITAL_COMMUNITY): Payer: Self-pay | Admitting: Internal Medicine

## 2018-12-14 ENCOUNTER — Other Ambulatory Visit (HOSPITAL_COMMUNITY): Payer: Self-pay | Admitting: Internal Medicine

## 2018-12-27 ENCOUNTER — Other Ambulatory Visit (HOSPITAL_COMMUNITY): Payer: Self-pay | Admitting: Internal Medicine

## 2019-01-02 DIAGNOSIS — G894 Chronic pain syndrome: Secondary | ICD-10-CM | POA: Diagnosis not present

## 2019-01-02 DIAGNOSIS — M47817 Spondylosis without myelopathy or radiculopathy, lumbosacral region: Secondary | ICD-10-CM | POA: Diagnosis not present

## 2019-01-02 DIAGNOSIS — Z79899 Other long term (current) drug therapy: Secondary | ICD-10-CM | POA: Diagnosis not present

## 2019-01-02 DIAGNOSIS — M5126 Other intervertebral disc displacement, lumbar region: Secondary | ICD-10-CM | POA: Diagnosis not present

## 2019-01-02 DIAGNOSIS — M5136 Other intervertebral disc degeneration, lumbar region: Secondary | ICD-10-CM | POA: Diagnosis not present

## 2019-01-02 DIAGNOSIS — Z79891 Long term (current) use of opiate analgesic: Secondary | ICD-10-CM | POA: Diagnosis not present

## 2019-01-03 ENCOUNTER — Other Ambulatory Visit (HOSPITAL_COMMUNITY): Payer: Self-pay

## 2019-01-03 MED ORDER — AMLODIPINE BESYLATE 5 MG PO TABS: ORAL_TABLET | ORAL | 0 refills | Status: DC

## 2019-01-04 DIAGNOSIS — M85852 Other specified disorders of bone density and structure, left thigh: Secondary | ICD-10-CM | POA: Diagnosis not present

## 2019-01-04 DIAGNOSIS — R29890 Loss of height: Secondary | ICD-10-CM | POA: Diagnosis not present

## 2019-01-04 DIAGNOSIS — Z90710 Acquired absence of both cervix and uterus: Secondary | ICD-10-CM | POA: Diagnosis not present

## 2019-01-15 DIAGNOSIS — M47817 Spondylosis without myelopathy or radiculopathy, lumbosacral region: Secondary | ICD-10-CM | POA: Diagnosis not present

## 2019-01-15 DIAGNOSIS — M5136 Other intervertebral disc degeneration, lumbar region: Secondary | ICD-10-CM | POA: Diagnosis not present

## 2019-01-15 DIAGNOSIS — M5416 Radiculopathy, lumbar region: Secondary | ICD-10-CM | POA: Diagnosis not present

## 2019-01-15 DIAGNOSIS — M5126 Other intervertebral disc displacement, lumbar region: Secondary | ICD-10-CM | POA: Diagnosis not present

## 2019-01-16 ENCOUNTER — Other Ambulatory Visit (HOSPITAL_COMMUNITY): Payer: Self-pay

## 2019-01-16 MED ORDER — IVABRADINE HCL 7.5 MG PO TABS: 7.5000 mg | ORAL_TABLET | Freq: Two times a day (BID) | ORAL | 1 refills | Status: DC
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
## 2019-01-17 ENCOUNTER — Other Ambulatory Visit: Payer: Self-pay

## 2019-01-17 ENCOUNTER — Ambulatory Visit (HOSPITAL_COMMUNITY)
Admission: RE | Admit: 2019-01-17 | Discharge: 2019-01-17 | Disposition: A | Discharge status: Home / Self Care | Payer: Medicare Other | Source: Ambulatory Visit | Attending: Internal Medicine | Admitting: Internal Medicine

## 2019-01-17 ENCOUNTER — Telehealth (HOSPITAL_COMMUNITY): Payer: Self-pay | Admitting: Pharmacy Technician

## 2019-01-17 ENCOUNTER — Encounter (HOSPITAL_COMMUNITY): Payer: Self-pay | Admitting: Internal Medicine

## 2019-01-17 VITALS — BP 154/79 | HR 55 | Wt 241.1 lb

## 2019-01-17 DIAGNOSIS — I2510 Atherosclerotic heart disease of native coronary artery without angina pectoris: Secondary | ICD-10-CM | POA: Diagnosis not present

## 2019-01-17 DIAGNOSIS — I219 Acute myocardial infarction, unspecified: Secondary | ICD-10-CM | POA: Diagnosis not present

## 2019-01-17 DIAGNOSIS — I10 Essential (primary) hypertension: Secondary | ICD-10-CM | POA: Diagnosis not present

## 2019-01-17 DIAGNOSIS — I252 Old myocardial infarction: Secondary | ICD-10-CM | POA: Insufficient documentation

## 2019-01-17 DIAGNOSIS — N184 Chronic kidney disease, stage 4 (severe): Secondary | ICD-10-CM | POA: Insufficient documentation

## 2019-01-17 DIAGNOSIS — Z794 Long term (current) use of insulin: Secondary | ICD-10-CM | POA: Diagnosis not present

## 2019-01-17 DIAGNOSIS — Z7902 Long term (current) use of antithrombotics/antiplatelets: Secondary | ICD-10-CM | POA: Diagnosis not present

## 2019-01-17 DIAGNOSIS — Z79899 Other long term (current) drug therapy: Secondary | ICD-10-CM | POA: Diagnosis not present

## 2019-01-17 DIAGNOSIS — I130 Hypertensive heart and chronic kidney disease with heart failure and stage 1 through stage 4 chronic kidney disease, or unspecified chronic kidney disease: Secondary | ICD-10-CM | POA: Insufficient documentation

## 2019-01-17 DIAGNOSIS — Z7982 Long term (current) use of aspirin: Secondary | ICD-10-CM | POA: Diagnosis not present

## 2019-01-17 DIAGNOSIS — I272 Pulmonary hypertension, unspecified: Secondary | ICD-10-CM | POA: Diagnosis not present

## 2019-01-17 DIAGNOSIS — M109 Gout, unspecified: Secondary | ICD-10-CM | POA: Insufficient documentation

## 2019-01-17 DIAGNOSIS — G4733 Obstructive sleep apnea (adult) (pediatric): Secondary | ICD-10-CM | POA: Insufficient documentation

## 2019-01-17 DIAGNOSIS — Z8673 Personal history of transient ischemic attack (TIA), and cerebral infarction without residual deficits: Secondary | ICD-10-CM | POA: Insufficient documentation

## 2019-01-17 DIAGNOSIS — Z8249 Family history of ischemic heart disease and other diseases of the circulatory system: Secondary | ICD-10-CM | POA: Diagnosis not present

## 2019-01-17 DIAGNOSIS — I5042 Chronic combined systolic (congestive) and diastolic (congestive) heart failure: Secondary | ICD-10-CM | POA: Diagnosis not present

## 2019-01-17 DIAGNOSIS — I255 Ischemic cardiomyopathy: Secondary | ICD-10-CM | POA: Diagnosis not present

## 2019-01-17 DIAGNOSIS — I5022 Chronic systolic (congestive) heart failure: Secondary | ICD-10-CM

## 2019-01-17 DIAGNOSIS — R0602 Shortness of breath: Secondary | ICD-10-CM | POA: Diagnosis not present

## 2019-01-17 DIAGNOSIS — K219 Gastro-esophageal reflux disease without esophagitis: Secondary | ICD-10-CM | POA: Diagnosis not present

## 2019-01-17 DIAGNOSIS — I428 Other cardiomyopathies: Secondary | ICD-10-CM | POA: Diagnosis not present

## 2019-01-17 DIAGNOSIS — E1122 Type 2 diabetes mellitus with diabetic chronic kidney disease: Secondary | ICD-10-CM | POA: Diagnosis not present

## 2019-01-17 LAB — BASIC METABOLIC PANEL
Anion gap: 12 (ref 5–15)
BUN: 68 mg/dL — ABNORMAL HIGH (ref 8–23)
CO2: 28 mmol/L (ref 22–32)
Calcium: 8.5 mg/dL — ABNORMAL LOW (ref 8.9–10.3)
Chloride: 102 mmol/L (ref 98–111)
Creatinine, Ser: 2.6 mg/dL — ABNORMAL HIGH (ref 0.44–1.00)
GFR calc Af Amer: 21 mL/min — ABNORMAL LOW (ref >60)
GFR calc non Af Amer: 18 mL/min — ABNORMAL LOW (ref >60)
Glucose, Bld: 163 mg/dL — ABNORMAL HIGH (ref 70–99)
Potassium: 3.6 mmol/L (ref 3.5–5.1)
Sodium: 142 mmol/L (ref 135–145)

## 2019-01-17 LAB — BRAIN NATRIURETIC PEPTIDE: B Natriuretic Peptide: 183.5 pg/mL — ABNORMAL HIGH (ref 0.0–100.0)

## 2019-01-17 NOTE — Addendum Note (Signed)
Encounter addended by: Shonna Chock, CMA on: 10/24/4101 3:19 PM  Actions taken: Order list changed, Diagnosis association updated, Charge Capture section accepted, Clinical Note Signed

## 2019-01-17 NOTE — Telephone Encounter (Signed)
Patient Advocate Encounter   Received notification from [employer] that prior authorization for Corlanor 7.5mg  is required.   PA submitted on CoverMyMeds Key [id number] Status is pending   Will continue to follow.  Charlann Boxer, CPhT

## 2019-01-17 NOTE — Patient Instructions (Signed)
Labs done today. We will contact you only if your labs are abnormal.  No medication changes were made today. Please continue all current medications as prescribed.   Your physician recommends that you schedule a follow-up appointment in: 6 months with an Echo prior. We will contact you to schedule an appointment.  Your physician has requested that you have an echocardiogram. Echocardiography is a painless test that uses sound waves to create images of your heart. It provides your doctor with information about the size and shape of your heart and how well your heart's chambers and valves are working. This procedure takes approximately one hour. There are no restrictions for this procedure.  At the [facility], you and your health needs are our priority. As part of our continuing mission to provide you with exceptional heart care, we have created designated Provider Care Teams. These Care Teams include your primary Cardiologist (physician) and Advanced Practice Providers (APPs- Physician Assistants and Nurse Practitioners) who all work together to provide you with the care you need, when you need it.   You may see any of the following providers on your designated Care Team at your next follow up: Marland Kitchen Dr Glori Bickers . Dr Loralie Champagne . Darrick Grinder, NP   Please be sure to bring in all your medications bottles to every appointment.

## 2019-01-23 ENCOUNTER — Other Ambulatory Visit (HOSPITAL_COMMUNITY): Payer: Self-pay

## 2019-01-23 MED ORDER — ENTRESTO 97-103 MG PO TABS: ORAL_TABLET | ORAL | 0 refills | Status: DC

## 2019-01-26 ENCOUNTER — Telehealth (HOSPITAL_COMMUNITY): Payer: Self-pay | Admitting: Pharmacist

## 2019-01-26 ENCOUNTER — Other Ambulatory Visit (HOSPITAL_COMMUNITY): Payer: Self-pay

## 2019-01-26 MED ORDER — CARVEDILOL 6.25 MG PO TABS: 6.2500 mg | ORAL_TABLET | Freq: Two times a day (BID) | ORAL | 3 refills | Status: DC

## 2019-01-26 NOTE — Telephone Encounter (Signed)
Advanced Heart Failure Patient Advocate Encounter  Prior Authorization for Corlanor has been approved.    Effective dates: 12/27/2018 through 01/26/2020  Patients co-pay is $0.00  Audry Riles, PharmD, BCPS, CPP Heart Failure Clinic Pharmacist [phone]

## 2019-01-30 DIAGNOSIS — G894 Chronic pain syndrome: Secondary | ICD-10-CM | POA: Diagnosis not present

## 2019-01-30 DIAGNOSIS — M5136 Other intervertebral disc degeneration, lumbar region: Secondary | ICD-10-CM | POA: Diagnosis not present

## 2019-01-30 DIAGNOSIS — M47816 Spondylosis without myelopathy or radiculopathy, lumbar region: Secondary | ICD-10-CM | POA: Diagnosis not present

## 2019-01-30 DIAGNOSIS — M5126 Other intervertebral disc displacement, lumbar region: Secondary | ICD-10-CM | POA: Diagnosis not present

## 2019-01-31 DIAGNOSIS — E118 Type 2 diabetes mellitus with unspecified complications: Secondary | ICD-10-CM | POA: Diagnosis not present

## 2019-01-31 DIAGNOSIS — Z8673 Personal history of transient ischemic attack (TIA), and cerebral infarction without residual deficits: Secondary | ICD-10-CM | POA: Diagnosis not present

## 2019-01-31 DIAGNOSIS — Z7189 Other specified counseling: Secondary | ICD-10-CM | POA: Diagnosis not present

## 2019-01-31 DIAGNOSIS — I10 Essential (primary) hypertension: Secondary | ICD-10-CM | POA: Diagnosis not present

## 2019-01-31 DIAGNOSIS — E7800 Pure hypercholesterolemia, unspecified: Secondary | ICD-10-CM | POA: Diagnosis not present

## 2019-01-31 DIAGNOSIS — G4733 Obstructive sleep apnea (adult) (pediatric): Secondary | ICD-10-CM | POA: Diagnosis not present

## 2019-01-31 DIAGNOSIS — I509 Heart failure, unspecified: Secondary | ICD-10-CM | POA: Diagnosis not present

## 2019-01-31 DIAGNOSIS — E039 Hypothyroidism, unspecified: Secondary | ICD-10-CM | POA: Diagnosis not present

## 2019-01-31 DIAGNOSIS — N184 Chronic kidney disease, stage 4 (severe): Secondary | ICD-10-CM | POA: Diagnosis not present

## 2019-02-05 ENCOUNTER — Other Ambulatory Visit (HOSPITAL_COMMUNITY): Payer: Self-pay

## 2019-02-05 DIAGNOSIS — H43393 Other vitreous opacities, bilateral: Secondary | ICD-10-CM | POA: Diagnosis not present

## 2019-02-05 DIAGNOSIS — H40023 Open angle with borderline findings, high risk, bilateral: Secondary | ICD-10-CM | POA: Diagnosis not present

## 2019-02-05 MED ORDER — AMLODIPINE BESYLATE 5 MG PO TABS: ORAL_TABLET | ORAL | 0 refills | Status: DC

## 2019-02-21 ENCOUNTER — Other Ambulatory Visit (HOSPITAL_COMMUNITY): Payer: Self-pay

## 2019-02-21 MED ORDER — ENTRESTO 97-103 MG PO TABS: ORAL_TABLET | ORAL | 0 refills | Status: DC

## 2019-03-05 DIAGNOSIS — F419 Anxiety disorder, unspecified: Secondary | ICD-10-CM | POA: Diagnosis not present

## 2019-03-05 DIAGNOSIS — R110 Nausea: Secondary | ICD-10-CM | POA: Diagnosis not present

## 2019-03-05 DIAGNOSIS — J019 Acute sinusitis, unspecified: Secondary | ICD-10-CM | POA: Diagnosis not present

## 2019-03-05 DIAGNOSIS — K219 Gastro-esophageal reflux disease without esophagitis: Secondary | ICD-10-CM | POA: Diagnosis not present

## 2019-03-14 ENCOUNTER — Other Ambulatory Visit: Payer: Self-pay

## 2019-03-14 DIAGNOSIS — Z20822 Contact with and (suspected) exposure to covid-19: Secondary | ICD-10-CM

## 2019-03-14 DIAGNOSIS — Z20828 Contact with and (suspected) exposure to other viral communicable diseases: Secondary | ICD-10-CM | POA: Diagnosis not present

## 2019-03-16 LAB — NOVEL CORONAVIRUS, NAA: SARS-CoV-2, NAA: NOT DETECTED

## 2019-03-20 ENCOUNTER — Other Ambulatory Visit (HOSPITAL_COMMUNITY): Payer: Self-pay

## 2019-03-20 MED ORDER — TORSEMIDE 100 MG PO TABS: ORAL_TABLET | ORAL | 4 refills | Status: DC

## 2019-03-21 ENCOUNTER — Other Ambulatory Visit (HOSPITAL_COMMUNITY): Payer: Self-pay

## 2019-03-21 MED ORDER — IVABRADINE HCL 7.5 MG PO TABS: 7.5000 mg | ORAL_TABLET | Freq: Two times a day (BID) | ORAL | 1 refills | Status: DC

## 2019-04-03 ENCOUNTER — Other Ambulatory Visit: Payer: Self-pay

## 2019-04-03 DIAGNOSIS — I2510 Atherosclerotic heart disease of native coronary artery without angina pectoris: Secondary | ICD-10-CM | POA: Diagnosis not present

## 2019-04-03 DIAGNOSIS — Z8673 Personal history of transient ischemic attack (TIA), and cerebral infarction without residual deficits: Secondary | ICD-10-CM | POA: Insufficient documentation

## 2019-04-03 DIAGNOSIS — E1122 Type 2 diabetes mellitus with diabetic chronic kidney disease: Secondary | ICD-10-CM | POA: Insufficient documentation

## 2019-04-03 DIAGNOSIS — I252 Old myocardial infarction: Secondary | ICD-10-CM | POA: Diagnosis not present

## 2019-04-03 DIAGNOSIS — N184 Chronic kidney disease, stage 4 (severe): Secondary | ICD-10-CM | POA: Insufficient documentation

## 2019-04-03 DIAGNOSIS — Z794 Long term (current) use of insulin: Secondary | ICD-10-CM | POA: Diagnosis not present

## 2019-04-03 DIAGNOSIS — Z79899 Other long term (current) drug therapy: Secondary | ICD-10-CM | POA: Diagnosis not present

## 2019-04-03 DIAGNOSIS — Z7982 Long term (current) use of aspirin: Secondary | ICD-10-CM | POA: Insufficient documentation

## 2019-04-03 DIAGNOSIS — R1033 Periumbilical pain: Secondary | ICD-10-CM | POA: Insufficient documentation

## 2019-04-03 DIAGNOSIS — I5042 Chronic combined systolic (congestive) and diastolic (congestive) heart failure: Secondary | ICD-10-CM | POA: Diagnosis not present

## 2019-04-03 DIAGNOSIS — I130 Hypertensive heart and chronic kidney disease with heart failure and stage 1 through stage 4 chronic kidney disease, or unspecified chronic kidney disease: Secondary | ICD-10-CM | POA: Insufficient documentation

## 2019-04-03 DIAGNOSIS — Z7902 Long term (current) use of antithrombotics/antiplatelets: Secondary | ICD-10-CM | POA: Insufficient documentation

## 2019-04-03 LAB — CBC WITH DIFFERENTIAL/PLATELET
Abs Immature Granulocytes: 0.02 10*3/uL (ref 0.00–0.07)
Basophils Absolute: 0 10*3/uL (ref 0.0–0.1)
Basophils Relative: 0 %
Eosinophils Absolute: 0 10*3/uL (ref 0.0–0.5)
Eosinophils Relative: 0 %
HCT: 46.2 % — ABNORMAL HIGH (ref 36.0–46.0)
Hemoglobin: 13.8 g/dL (ref 12.0–15.0)
Immature Granulocytes: 0 %
Lymphocytes Relative: 19 %
Lymphs Abs: 1.3 10*3/uL (ref 0.7–4.0)
MCH: 27.8 pg (ref 26.0–34.0)
MCHC: 29.9 g/dL — ABNORMAL LOW (ref 30.0–36.0)
MCV: 93.1 fL (ref 80.0–100.0)
Monocytes Absolute: 0.5 10*3/uL (ref 0.1–1.0)
Monocytes Relative: 7 %
Neutro Abs: 5.1 10*3/uL (ref 1.7–7.7)
Neutrophils Relative %: 74 %
Platelets: 207 10*3/uL (ref 150–400)
RBC: 4.96 MIL/uL (ref 3.87–5.11)
RDW: 16.4 % — ABNORMAL HIGH (ref 11.5–15.5)
WBC: 6.9 10*3/uL (ref 4.0–10.5)
nRBC: 0 % (ref 0.0–0.2)

## 2019-04-03 LAB — COMPREHENSIVE METABOLIC PANEL
ALT: 16 U/L (ref 0–44)
AST: 25 U/L (ref 15–41)
Albumin: 4.1 g/dL (ref 3.5–5.0)
Alkaline Phosphatase: 67 U/L (ref 38–126)
Anion gap: 19 — ABNORMAL HIGH (ref 5–15)
BUN: 24 mg/dL — ABNORMAL HIGH (ref 8–23)
CO2: 23 mmol/L (ref 22–32)
Calcium: 9.7 mg/dL (ref 8.9–10.3)
Chloride: 104 mmol/L (ref 98–111)
Creatinine, Ser: 1.55 mg/dL — ABNORMAL HIGH (ref 0.44–1.00)
GFR calc Af Amer: 38 mL/min — ABNORMAL LOW (ref >60)
GFR calc non Af Amer: 33 mL/min — ABNORMAL LOW (ref >60)
Glucose, Bld: 250 mg/dL — ABNORMAL HIGH (ref 70–99)
Potassium: 3.8 mmol/L (ref 3.5–5.1)
Sodium: 146 mmol/L — ABNORMAL HIGH (ref 135–145)
Total Bilirubin: 0.4 mg/dL (ref 0.3–1.2)
Total Protein: 8.3 g/dL — ABNORMAL HIGH (ref 6.5–8.1)

## 2019-04-03 LAB — LIPASE, BLOOD: Lipase: 28 U/L (ref 11–51)

## 2019-04-03 NOTE — ED Notes (Signed)
[place], MD made aware of pt's blood pressure of 207/120 at time of giving EKG

## 2019-04-03 NOTE — ED Notes (Signed)
[place] (daughter)

## 2019-04-03 NOTE — ED Triage Notes (Addendum)
Per ems, patient reports generalized abd pain starting 04/01/09. Patient rates pain 8/10. Patient reports she has had episodes of vomiting.

## 2019-04-03 NOTE — ED Notes (Signed)
[id number] Grace Bradford)

## 2019-04-04 ENCOUNTER — Emergency Department (HOSPITAL_COMMUNITY): Payer: Medicare Other

## 2019-04-04 ENCOUNTER — Emergency Department (HOSPITAL_COMMUNITY)
Admission: EM | Admit: 2019-04-04 | Discharge: 2019-04-04 | Disposition: A | Discharge status: Home / Self Care | Payer: Medicare Other | Attending: Emergency Medicine | Admitting: Emergency Medicine

## 2019-04-04 DIAGNOSIS — R1033 Periumbilical pain: Secondary | ICD-10-CM | POA: Diagnosis not present

## 2019-04-04 MED ORDER — ONDANSETRON HCL 4 MG/2ML IJ SOLN
4.0000 mg | Freq: Once | INTRAMUSCULAR | Status: AC
  Administered 2019-04-04: 4 mg via INTRAVENOUS
  Filled 2019-04-04: qty 2

## 2019-04-04 MED ORDER — PANTOPRAZOLE SODIUM 40 MG PO TBEC: 40.0000 mg | DELAYED_RELEASE_TABLET | Freq: Two times a day (BID) | ORAL | 0 refills | Status: DC

## 2019-04-04 MED ORDER — MORPHINE SULFATE (PF) 4 MG/ML IV SOLN
4.0000 mg | Freq: Once | INTRAVENOUS | Status: AC
  Administered 2019-04-04: 4 mg via INTRAVENOUS
  Filled 2019-04-04: qty 1

## 2019-04-04 MED ORDER — IOHEXOL 9 MG/ML PO SOLN
ORAL | Status: AC
  Administered 2019-04-04: 06:00:00
  Filled 2019-04-04: qty 1000

## 2019-04-04 MED ORDER — LABETALOL HCL 5 MG/ML IV SOLN
10.0000 mg | Freq: Once | INTRAVENOUS | Status: AC
  Administered 2019-04-04: 10 mg via INTRAVENOUS
  Filled 2019-04-04: qty 4

## 2019-04-04 MED ORDER — IOHEXOL 9 MG/ML PO SOLN
1000.0000 mL | Freq: Once | ORAL | Status: AC
  Administered 2019-04-04: 1000 mL via ORAL
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
## 2019-04-04 NOTE — ED Notes (Signed)
Pt reminded a urine specimen is needed 

## 2019-04-04 NOTE — Discharge Instructions (Addendum)
Your lab and CT evaluation tonight did not identify a cause of your pain. You can be discharged home and should call your doctor for a recheck appointment in 2-3 days.   Please return to the ED if you develop any fever, severe pain, uncontrolled vomiting. Please take Protonix not once but twice daily as there might be a concern for some blood in your emesis. This needs to be discussed with your doctor on your recheck visit.

## 2019-04-12 DIAGNOSIS — M5126 Other intervertebral disc displacement, lumbar region: Secondary | ICD-10-CM | POA: Diagnosis not present

## 2019-04-12 DIAGNOSIS — M5136 Other intervertebral disc degeneration, lumbar region: Secondary | ICD-10-CM | POA: Diagnosis not present

## 2019-04-12 DIAGNOSIS — G894 Chronic pain syndrome: Secondary | ICD-10-CM | POA: Diagnosis not present

## 2019-04-12 DIAGNOSIS — M47816 Spondylosis without myelopathy or radiculopathy, lumbar region: Secondary | ICD-10-CM | POA: Diagnosis not present

## 2019-04-24 ENCOUNTER — Other Ambulatory Visit (HOSPITAL_COMMUNITY): Payer: Self-pay

## 2019-04-24 MED ORDER — IVABRADINE HCL 7.5 MG PO TABS: 7.5000 mg | ORAL_TABLET | Freq: Two times a day (BID) | ORAL | 3 refills | Status: DC

## 2019-04-26 ENCOUNTER — Other Ambulatory Visit: Payer: Self-pay | Admitting: Internal Medicine

## 2019-04-26 ENCOUNTER — Other Ambulatory Visit: Payer: Self-pay | Admitting: *Deleted

## 2019-04-26 MED ORDER — ENTRESTO 97-103 MG PO TABS: ORAL_TABLET | ORAL | 0 refills | Status: DC

## 2019-05-02 DIAGNOSIS — D649 Anemia, unspecified: Secondary | ICD-10-CM | POA: Diagnosis not present

## 2019-05-02 DIAGNOSIS — N183 Chronic kidney disease, stage 3 unspecified: Secondary | ICD-10-CM | POA: Diagnosis not present

## 2019-05-02 DIAGNOSIS — N2581 Secondary hyperparathyroidism of renal origin: Secondary | ICD-10-CM | POA: Diagnosis not present

## 2019-05-02 DIAGNOSIS — I2721 Secondary pulmonary arterial hypertension: Secondary | ICD-10-CM | POA: Diagnosis not present

## 2019-05-02 DIAGNOSIS — I129 Hypertensive chronic kidney disease with stage 1 through stage 4 chronic kidney disease, or unspecified chronic kidney disease: Secondary | ICD-10-CM | POA: Diagnosis not present

## 2019-05-02 DIAGNOSIS — E1122 Type 2 diabetes mellitus with diabetic chronic kidney disease: Secondary | ICD-10-CM | POA: Diagnosis not present

## 2019-05-03 DIAGNOSIS — E039 Hypothyroidism, unspecified: Secondary | ICD-10-CM | POA: Diagnosis not present

## 2019-05-03 DIAGNOSIS — E118 Type 2 diabetes mellitus with unspecified complications: Secondary | ICD-10-CM | POA: Diagnosis not present

## 2019-05-07 ENCOUNTER — Other Ambulatory Visit (HOSPITAL_COMMUNITY): Payer: Self-pay

## 2019-05-07 MED ORDER — AMLODIPINE BESYLATE 5 MG PO TABS: ORAL_TABLET | ORAL | 0 refills | Status: DC

## 2019-05-09 DIAGNOSIS — E7800 Pure hypercholesterolemia, unspecified: Secondary | ICD-10-CM | POA: Diagnosis not present

## 2019-05-09 DIAGNOSIS — E118 Type 2 diabetes mellitus with unspecified complications: Secondary | ICD-10-CM | POA: Diagnosis not present

## 2019-05-09 DIAGNOSIS — Z8673 Personal history of transient ischemic attack (TIA), and cerebral infarction without residual deficits: Secondary | ICD-10-CM | POA: Diagnosis not present

## 2019-05-09 DIAGNOSIS — N184 Chronic kidney disease, stage 4 (severe): Secondary | ICD-10-CM | POA: Diagnosis not present

## 2019-05-09 DIAGNOSIS — I10 Essential (primary) hypertension: Secondary | ICD-10-CM | POA: Diagnosis not present

## 2019-05-09 DIAGNOSIS — G4733 Obstructive sleep apnea (adult) (pediatric): Secondary | ICD-10-CM | POA: Diagnosis not present

## 2019-05-09 DIAGNOSIS — I509 Heart failure, unspecified: Secondary | ICD-10-CM | POA: Diagnosis not present

## 2019-05-09 DIAGNOSIS — Z7189 Other specified counseling: Secondary | ICD-10-CM | POA: Diagnosis not present

## 2019-05-09 DIAGNOSIS — E039 Hypothyroidism, unspecified: Secondary | ICD-10-CM | POA: Diagnosis not present

## 2019-05-25 ENCOUNTER — Other Ambulatory Visit: Payer: Self-pay | Admitting: Internal Medicine

## 2019-05-30 ENCOUNTER — Telehealth (HOSPITAL_COMMUNITY): Payer: Self-pay | Admitting: Vascular Surgery

## 2019-05-30 NOTE — Telephone Encounter (Signed)
Left pt VM , to make 6 month f/u w/ echo w/ db, asked pt to call back to make appt

## 2019-06-01 DIAGNOSIS — M1A09X0 Idiopathic chronic gout, multiple sites, without tophus (tophi): Secondary | ICD-10-CM | POA: Diagnosis not present

## 2019-06-01 DIAGNOSIS — N184 Chronic kidney disease, stage 4 (severe): Secondary | ICD-10-CM | POA: Diagnosis not present

## 2019-06-01 DIAGNOSIS — M1712 Unilateral primary osteoarthritis, left knee: Secondary | ICD-10-CM | POA: Diagnosis not present

## 2019-06-01 DIAGNOSIS — M150 Primary generalized (osteo)arthritis: Secondary | ICD-10-CM | POA: Diagnosis not present

## 2019-06-01 DIAGNOSIS — M79644 Pain in right finger(s): Secondary | ICD-10-CM | POA: Diagnosis not present

## 2019-06-01 DIAGNOSIS — M549 Dorsalgia, unspecified: Secondary | ICD-10-CM | POA: Diagnosis not present

## 2019-06-01 DIAGNOSIS — M25561 Pain in right knee: Secondary | ICD-10-CM | POA: Diagnosis not present

## 2019-06-01 DIAGNOSIS — M109 Gout, unspecified: Secondary | ICD-10-CM | POA: Diagnosis not present

## 2019-06-01 DIAGNOSIS — M170 Bilateral primary osteoarthritis of knee: Secondary | ICD-10-CM | POA: Diagnosis not present

## 2019-06-01 DIAGNOSIS — I509 Heart failure, unspecified: Secondary | ICD-10-CM | POA: Diagnosis not present

## 2019-06-07 DIAGNOSIS — M5126 Other intervertebral disc displacement, lumbar region: Secondary | ICD-10-CM | POA: Diagnosis not present

## 2019-06-07 DIAGNOSIS — M47816 Spondylosis without myelopathy or radiculopathy, lumbar region: Secondary | ICD-10-CM | POA: Diagnosis not present

## 2019-06-07 DIAGNOSIS — M5416 Radiculopathy, lumbar region: Secondary | ICD-10-CM | POA: Diagnosis not present

## 2019-06-07 DIAGNOSIS — M5136 Other intervertebral disc degeneration, lumbar region: Secondary | ICD-10-CM | POA: Diagnosis not present

## 2019-07-05 DIAGNOSIS — M5136 Other intervertebral disc degeneration, lumbar region: Secondary | ICD-10-CM | POA: Diagnosis not present

## 2019-07-05 DIAGNOSIS — Z79899 Other long term (current) drug therapy: Secondary | ICD-10-CM | POA: Diagnosis not present

## 2019-07-05 DIAGNOSIS — G894 Chronic pain syndrome: Secondary | ICD-10-CM | POA: Diagnosis not present

## 2019-07-05 DIAGNOSIS — M47816 Spondylosis without myelopathy or radiculopathy, lumbar region: Secondary | ICD-10-CM | POA: Diagnosis not present

## 2019-07-05 DIAGNOSIS — Z79891 Long term (current) use of opiate analgesic: Secondary | ICD-10-CM | POA: Diagnosis not present

## 2019-07-05 DIAGNOSIS — M5126 Other intervertebral disc displacement, lumbar region: Secondary | ICD-10-CM | POA: Diagnosis not present

## 2019-07-06 DIAGNOSIS — M150 Primary generalized (osteo)arthritis: Secondary | ICD-10-CM | POA: Diagnosis not present

## 2019-07-06 DIAGNOSIS — I509 Heart failure, unspecified: Secondary | ICD-10-CM | POA: Diagnosis not present

## 2019-07-06 DIAGNOSIS — I10 Essential (primary) hypertension: Secondary | ICD-10-CM | POA: Diagnosis not present

## 2019-07-06 DIAGNOSIS — M545 Low back pain: Secondary | ICD-10-CM | POA: Diagnosis not present

## 2019-07-06 DIAGNOSIS — E118 Type 2 diabetes mellitus with unspecified complications: Secondary | ICD-10-CM | POA: Diagnosis not present

## 2019-07-06 DIAGNOSIS — G629 Polyneuropathy, unspecified: Secondary | ICD-10-CM | POA: Diagnosis not present

## 2019-07-06 DIAGNOSIS — K219 Gastro-esophageal reflux disease without esophagitis: Secondary | ICD-10-CM | POA: Diagnosis not present

## 2019-07-06 DIAGNOSIS — Z0000 Encounter for general adult medical examination without abnormal findings: Secondary | ICD-10-CM | POA: Diagnosis not present

## 2019-07-06 DIAGNOSIS — N184 Chronic kidney disease, stage 4 (severe): Secondary | ICD-10-CM | POA: Diagnosis not present

## 2019-07-06 DIAGNOSIS — E039 Hypothyroidism, unspecified: Secondary | ICD-10-CM | POA: Diagnosis not present

## 2019-07-06 DIAGNOSIS — E7800 Pure hypercholesterolemia, unspecified: Secondary | ICD-10-CM | POA: Diagnosis not present

## 2019-07-06 DIAGNOSIS — M1A09X0 Idiopathic chronic gout, multiple sites, without tophus (tophi): Secondary | ICD-10-CM | POA: Diagnosis not present

## 2019-07-11 ENCOUNTER — Telehealth (HOSPITAL_COMMUNITY): Payer: Self-pay | Admitting: *Deleted

## 2019-07-11 ENCOUNTER — Emergency Department (HOSPITAL_COMMUNITY): Payer: Medicare Other

## 2019-07-11 ENCOUNTER — Other Ambulatory Visit: Payer: Self-pay

## 2019-07-11 ENCOUNTER — Encounter (HOSPITAL_COMMUNITY): Payer: Self-pay | Admitting: Emergency Medicine

## 2019-07-11 ENCOUNTER — Emergency Department (HOSPITAL_COMMUNITY)
Admission: EM | Admit: 2019-07-11 | Discharge: 2019-07-11 | Disposition: A | Discharge status: Home / Self Care | Payer: Medicare Other | Attending: Emergency Medicine | Admitting: Emergency Medicine

## 2019-07-11 DIAGNOSIS — I110 Hypertensive heart disease with heart failure: Secondary | ICD-10-CM | POA: Diagnosis not present

## 2019-07-11 DIAGNOSIS — R0602 Shortness of breath: Secondary | ICD-10-CM | POA: Diagnosis not present

## 2019-07-11 DIAGNOSIS — Z8673 Personal history of transient ischemic attack (TIA), and cerebral infarction without residual deficits: Secondary | ICD-10-CM | POA: Diagnosis not present

## 2019-07-11 DIAGNOSIS — E119 Type 2 diabetes mellitus without complications: Secondary | ICD-10-CM | POA: Diagnosis not present

## 2019-07-11 DIAGNOSIS — I130 Hypertensive heart and chronic kidney disease with heart failure and stage 1 through stage 4 chronic kidney disease, or unspecified chronic kidney disease: Secondary | ICD-10-CM | POA: Diagnosis not present

## 2019-07-11 DIAGNOSIS — N189 Chronic kidney disease, unspecified: Secondary | ICD-10-CM | POA: Diagnosis not present

## 2019-07-11 DIAGNOSIS — I252 Old myocardial infarction: Secondary | ICD-10-CM | POA: Diagnosis not present

## 2019-07-11 DIAGNOSIS — I5042 Chronic combined systolic (congestive) and diastolic (congestive) heart failure: Secondary | ICD-10-CM | POA: Diagnosis not present

## 2019-07-11 DIAGNOSIS — R001 Bradycardia, unspecified: Secondary | ICD-10-CM | POA: Diagnosis not present

## 2019-07-11 DIAGNOSIS — R0902 Hypoxemia: Secondary | ICD-10-CM | POA: Diagnosis not present

## 2019-07-11 DIAGNOSIS — I10 Essential (primary) hypertension: Secondary | ICD-10-CM | POA: Diagnosis not present

## 2019-07-11 DIAGNOSIS — I509 Heart failure, unspecified: Secondary | ICD-10-CM | POA: Diagnosis not present

## 2019-07-11 LAB — CBC
HCT: 34.9 % — ABNORMAL LOW (ref 36.0–46.0)
Hemoglobin: 10.5 g/dL — ABNORMAL LOW (ref 12.0–15.0)
MCH: 27 pg (ref 26.0–34.0)
MCHC: 30.1 g/dL (ref 30.0–36.0)
MCV: 89.7 fL (ref 80.0–100.0)
Platelets: 133 10*3/uL — ABNORMAL LOW (ref 150–400)
RBC: 3.89 MIL/uL (ref 3.87–5.11)
RDW: 18.2 % — ABNORMAL HIGH (ref 11.5–15.5)
WBC: 6.9 10*3/uL (ref 4.0–10.5)
nRBC: 0 % (ref 0.0–0.2)

## 2019-07-11 LAB — BASIC METABOLIC PANEL
Anion gap: 14 (ref 5–15)
BUN: 28 mg/dL — ABNORMAL HIGH (ref 8–23)
CO2: 23 mmol/L (ref 22–32)
Calcium: 6.9 mg/dL — ABNORMAL LOW (ref 8.9–10.3)
Chloride: 104 mmol/L (ref 98–111)
Creatinine, Ser: 1.36 mg/dL — ABNORMAL HIGH (ref 0.44–1.00)
GFR calc Af Amer: 45 mL/min — ABNORMAL LOW (ref >60)
GFR calc non Af Amer: 39 mL/min — ABNORMAL LOW (ref >60)
Glucose, Bld: 94 mg/dL (ref 70–99)
Potassium: 4.2 mmol/L (ref 3.5–5.1)
Sodium: 141 mmol/L (ref 135–145)

## 2019-07-11 MED ORDER — SODIUM CHLORIDE 0.9% FLUSH
3.0000 mL | Freq: Once | INTRAVENOUS | Status: AC
  Administered 2019-07-11: 3 mL via INTRAVENOUS

## 2019-07-11 MED ORDER — FUROSEMIDE 10 MG/ML IJ SOLN
80.0000 mg | Freq: Once | INTRAMUSCULAR | Status: AC
  Administered 2019-07-11: 80 mg via INTRAVENOUS
  Filled 2019-07-11: qty 8
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
## 2019-07-11 NOTE — Discharge Instructions (Addendum)
You were evaluated in the Emergency Department and after careful evaluation, we did not find any emergent condition requiring admission or further testing in the hospital.  Your exam/testing today is overall reassuring.  Your symptoms seem to be due to mild flare of your heart failure.  We have set you up with some home assistance to continue getting the extra fluid out of your body.  Please return to the Emergency Department if you experience any worsening of your condition.  We encourage you to follow up with a primary care provider.  Thank you for allowing [place] to be a part of your care.

## 2019-07-11 NOTE — Telephone Encounter (Signed)
Pt left VM requesting the time of her echo and f/u appointments tomorrow. I called patient back no answer and VM full.

## 2019-07-11 NOTE — ED Triage Notes (Signed)
Pt to triage via [facility] from home.  C/o SOB x 1 week-worse on exertion.  16 lb weight gain and lower extremity swelling.  PT completed prednisone Rx over the weekend.  Initially 90% on room air.  EMS placed on 2 liters [place] for a brief period.    CBG 123.

## 2019-07-11 NOTE — Plan of Care (Signed)
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 She is up approximately 16 lbs from her reported dry weight. She has been taking her torsemide as prescribed, 50 mg QD. She presented to the ED because of her worsening DOE. Per the ED she has had increased LE edema and orthopnea. She denies fevers/chills, cough, abdominal pain, chest pain. She has evidence of volume overload on exam with an elevated BNP and pulmonary edema on CXR. She is otherwise hemodynamically stable and her renal function is stable.   Meds:  No current facility-administered medications on file prior to encounter.   Current Outpatient Medications on File Prior to Encounter  Medication Sig Dispense Refill  . acetaminophen (TYLENOL) 500 MG tablet Take 1,000 mg by mouth every 6 (six) hours as needed for mild pain or moderate pain.     Marland Kitchen allopurinol (ZYLOPRIM) 100 MG tablet Take 50 mg by mouth daily.    Marland Kitchen amLODipine (NORVASC) 5 MG tablet Take 1 Tablet ( 5 MG ) By Mouth Daily. 90 tablet 0  . ARNICA EX Apply 1 application topically 2 (two) times daily as needed (pain).    Marland Kitchen aspirin EC 81 MG tablet Take 81 mg by mouth daily.    . budesonide (PULMICORT) 0.25 MG/2ML nebulizer solution Take 2 mLs (0.25 mg total) by nebulization 2 (two) times daily. (Patient not taking: Reported on 04/04/2019) 60 mL 12  . calcitRIOL (ROCALTROL) 0.25 MCG capsule Take 0.25 mcg by mouth daily.   0  . Calcium Carb-Cholecalciferol (CALCIUM 600 + D PO) Take 1 tablet by mouth daily.    . carvedilol (COREG) 6.25 MG tablet Take 1 tablet (6.25 mg total) by mouth 2 (two) times daily with a meal. 180 tablet 3  . [place] 450 MG CAPS Take 450 mg by mouth 2 (two) times daily.     . cetirizine (ZYRTEC) 10 MG tablet Take 10 mg by mouth daily.    0  . clobetasol cream (TEMOVATE) 0.17 % Apply 1 application topically 2 (two) times daily as needed. To affected area itching  0  . clopidogrel (PLAVIX) 75 MG tablet Take 75 mg by mouth daily.    . diazepam (VALIUM) 5 MG tablet Take 1 tablet (5 mg total) by mouth every 12 (twelve) hours as needed for muscle spasms. 1 tablet 0  . diclofenac Sodium (VOLTAREN) 1 % GEL Apply 2 g topically 4 (four) times daily as needed (pain).    . Dulaglutide (TRULICITY) 1.5 PZ/0.2HE SOPN Inject 1.5 mg into the skin every Thursday.    . fluticasone (FLONASE) 50 MCG/ACT nasal spray Place 2 sprays into both nostrils 2 (two) times daily as needed for allergies.   0  . Ginger, Zingiber officinalis, (GINGER ROOT) 500 MG CAPS Take 500 mg by mouth daily.    Marland Kitchen guaiFENesin (MUCINEX) 600 MG 12 hr tablet Take 2 tablets (1,200 mg total) by mouth 2 (two) times daily. 30 tablet 0  . guaiFENesin-dextromethorphan (ROBITUSSIN DM) 100-10 MG/5ML syrup Take 5 mLs by mouth every 4 (four) hours as needed for cough. (Patient not taking: Reported on 04/04/2019) 118 mL 0  . HYDROcodone-acetaminophen (NORCO) 7.5-325 MG tablet Take 1 tablet by mouth 3 (three) times daily.     . hydroxypropyl methylcellulose /  hypromellose (ISOPTO TEARS / GONIOVISC) 2.5 % ophthalmic solution Place 1 drop into both eyes 3 (three) times daily as needed for dry eyes.    . hydrOXYzine (ATARAX/VISTARIL) 25 MG tablet Take 50 mg by mouth 2 (two) times daily.     . Insulin Detemir (LEVEMIR FLEXPEN) 100 UNIT/ML Pen Inject 25 Units into the skin daily. (Patient taking differently: Inject 7-10 Units into the skin See admin instructions. 7 units in the am and 10 units at pm) 15 mL 0  . ipratropium-albuterol (DUONEB) 0.5-2.5 (3) MG/3ML SOLN Take 3 mLs by nebulization 3 (three) times daily. (Patient not taking: Reported on 04/04/2019) 360 mL 0  . ivabradine (CORLANOR) 7.5 MG TABS tablet Take 1 tablet (7.5 mg total) by mouth 2 (two) times daily with a meal. 180 tablet 3  .  levothyroxine (SYNTHROID) 100 MCG tablet Take 100 mcg by mouth See admin instructions. Pt takes Monday through Friday and does not take on the weekends    . metaxalone (SKELAXIN) 800 MG tablet Take 800 mg by mouth 3 (three) times daily.     . metolazone (ZAROXOLYN) 2.5 MG tablet Take 1 tablet (2.5 mg total) by mouth daily as needed. (Patient taking differently: Take 2.5 mg by mouth daily as needed (fluid). ) 30 tablet 0  . Multiple Vitamin (MULTIVITAMIN WITH MINERALS) TABS tablet Take 1 tablet by mouth daily.    . pantoprazole (PROTONIX) 40 MG tablet Take 1 tablet (40 mg total) by mouth 2 (two) times daily. 40 tablet 0  . potassium chloride SA (K-DUR,KLOR-CON) 20 MEQ tablet Take 1 tablet (20 mEq total) by mouth daily. 90 tablet 3  . pravastatin (PRAVACHOL) 80 MG tablet Take 80 mg by mouth Daily.     . pregabalin (LYRICA) 75 MG capsule Take 75-150 mg by mouth See admin instructions. 75 mg in the am and 150 mg at bedtimes    . Probiotic Product (PROBIOTIC PO) Take 1 capsule by mouth daily.    . sacubitril-valsartan (ENTRESTO) 97-103 MG TAKE 1 TABLET BY MOUTH TWICE DAILY 60 tablet 5  . sildenafil (REVATIO) 20 MG tablet TAKE 3 TABLETS BY MOUTH THREE TIMES DAILY (Patient taking differently: Take 60 mg by mouth 3 (three) times daily. ) 270 tablet 11  . torsemide (DEMADEX) 100 MG tablet TAKE HALF TABLET BY MOUTH EVERY DAY. MAY ALSO TAKE HALF TABLET AS NEEDED FOR WEIGHT GREATER THAN 233 (Patient taking differently: Take 50 mg by mouth See admin instructions. TAKE HALF TABLET BY MOUTH EVERY DAY. MAY ALSO TAKE HALF TABLET AS NEEDED FOR WEIGHT GREATER THAN 233) 60 tablet 4   Clinical Screening: (ALL ANSWERS MUST BE NO)  Based on current presentation is the patient likely to require: advanced diagnostics, advanced imaging (CT, MRI, nuclear stress testing), cardiac catheterization, EGD/colonoscopy, or lab monitoring not amendable to home monitoring (troponin, >q12 hour labs): NO.  Based on current presentation  is the patient is likely to require: mechanical ventilation (invasive and noninvasive, history of intubation) and/or vasoactive medications: NO.  Based on current presentation is the patient likely to require a surgical or IR procedure including but not limited to intraabdominal abscess drainage, percutaneous nephrostomy tube placement, thoracentesis for parapneumonic effusion, surgical wound debridement: NO.  Based on current presentation is the patient is likely to require: daily specialty consultation, blood transfusions, respiratory isolation/airborne precautions, active adjustments of opiates or IV pain medications: NO.  Does the patient have barriers that would make it unsafe to provide care in the home including but not limited  to severe AMS, active substance use disorder, history of or high risk of noncompliance with primary treatment plan: NO.  Has the patient ever been intubated or do they have a new tracheostomy: NO.  Does the patient have an unstable arrhythmia: NO.  Is hemodialysis likely to be required (i.e. already on HD or newly anuric/sever ATN): NO.  Is there risk for inability to obtain IV access: NO.]  Social Screening: (ALL QUESTIONS MUST BE YES) Does the patient have a home recovery environment? YES.  Is the patient's home recovery environment in an eligible geography [facility])? YES.  Does the patient's home have water, electricity, bathroom, heat/ac, refrigerator? YES.  Does the patient feel safe at home? YES.  Are family/caregivers willing to participate, as needed, while the patient participates [facility] [place].  Is there a person in the home (patient or other) willing/able to take vital signs and answer phone calls? YES.  Is the patient willing to put pets in a secure area while [facility] and affiliated staff are in the home? YES.  Is patient willing/able to participate in the [facility] (this includes [facility] affiliated staff entering the home, and associated services)? YES.  Is the patient/patient's HCP willing/able to sign consent? YES.  Assessment / Plan:  Based on the HPI and information obtained the patient is a candidate for the [facility]. Consent has been signed and the patient has been provided with a copy.   [facility] has been notified and will present to the patient's house morning morning.   Home health / DME needs: None  Medication needs: None  Other needs: Patient to follow-up with the HF clinic tomorrow.  Patient's contact information:  Phone: [phone] Address: [place], [place] [address]  Please do not hesitate to call with questions/concerns.   Ina Homes, MD 07/11/2019, 7:45 PM  Pager: [phone]

## 2019-07-12 ENCOUNTER — Encounter (HOSPITAL_COMMUNITY): Payer: Self-pay | Admitting: Internal Medicine

## 2019-07-12 ENCOUNTER — Ambulatory Visit (HOSPITAL_COMMUNITY)
Admission: RE | Admit: 2019-07-12 | Discharge: 2019-07-12 | Disposition: A | Discharge status: Home / Self Care | Payer: Medicare Other | Source: Ambulatory Visit | Attending: Internal Medicine | Admitting: Internal Medicine

## 2019-07-12 ENCOUNTER — Ambulatory Visit (HOSPITAL_BASED_OUTPATIENT_CLINIC_OR_DEPARTMENT_OTHER)
Admission: RE | Admit: 2019-07-12 | Discharge: 2019-07-12 | Disposition: A | Discharge status: Home / Self Care | Payer: Medicare Other | Source: Ambulatory Visit | Attending: Internal Medicine | Admitting: Internal Medicine

## 2019-07-12 VITALS — BP 144/48 | HR 61 | Wt 229.0 lb

## 2019-07-12 DIAGNOSIS — Z8249 Family history of ischemic heart disease and other diseases of the circulatory system: Secondary | ICD-10-CM | POA: Diagnosis not present

## 2019-07-12 DIAGNOSIS — E669 Obesity, unspecified: Secondary | ICD-10-CM | POA: Diagnosis not present

## 2019-07-12 DIAGNOSIS — I252 Old myocardial infarction: Secondary | ICD-10-CM | POA: Insufficient documentation

## 2019-07-12 DIAGNOSIS — I272 Pulmonary hypertension, unspecified: Secondary | ICD-10-CM | POA: Insufficient documentation

## 2019-07-12 DIAGNOSIS — Z7902 Long term (current) use of antithrombotics/antiplatelets: Secondary | ICD-10-CM | POA: Diagnosis not present

## 2019-07-12 DIAGNOSIS — M109 Gout, unspecified: Secondary | ICD-10-CM | POA: Insufficient documentation

## 2019-07-12 DIAGNOSIS — Z794 Long term (current) use of insulin: Secondary | ICD-10-CM | POA: Insufficient documentation

## 2019-07-12 DIAGNOSIS — R0602 Shortness of breath: Secondary | ICD-10-CM | POA: Insufficient documentation

## 2019-07-12 DIAGNOSIS — I130 Hypertensive heart and chronic kidney disease with heart failure and stage 1 through stage 4 chronic kidney disease, or unspecified chronic kidney disease: Secondary | ICD-10-CM | POA: Diagnosis not present

## 2019-07-12 DIAGNOSIS — I255 Ischemic cardiomyopathy: Secondary | ICD-10-CM | POA: Diagnosis not present

## 2019-07-12 DIAGNOSIS — I5042 Chronic combined systolic (congestive) and diastolic (congestive) heart failure: Secondary | ICD-10-CM | POA: Insufficient documentation

## 2019-07-12 DIAGNOSIS — I10 Essential (primary) hypertension: Secondary | ICD-10-CM | POA: Diagnosis not present

## 2019-07-12 DIAGNOSIS — E1122 Type 2 diabetes mellitus with diabetic chronic kidney disease: Secondary | ICD-10-CM | POA: Diagnosis not present

## 2019-07-12 DIAGNOSIS — Z79899 Other long term (current) drug therapy: Secondary | ICD-10-CM | POA: Insufficient documentation

## 2019-07-12 DIAGNOSIS — I219 Acute myocardial infarction, unspecified: Secondary | ICD-10-CM | POA: Diagnosis not present

## 2019-07-12 DIAGNOSIS — N184 Chronic kidney disease, stage 4 (severe): Secondary | ICD-10-CM

## 2019-07-12 DIAGNOSIS — Z7982 Long term (current) use of aspirin: Secondary | ICD-10-CM | POA: Insufficient documentation

## 2019-07-12 DIAGNOSIS — I5022 Chronic systolic (congestive) heart failure: Secondary | ICD-10-CM

## 2019-07-12 DIAGNOSIS — K219 Gastro-esophageal reflux disease without esophagitis: Secondary | ICD-10-CM | POA: Diagnosis not present

## 2019-07-12 DIAGNOSIS — E1140 Type 2 diabetes mellitus with diabetic neuropathy, unspecified: Secondary | ICD-10-CM | POA: Insufficient documentation

## 2019-07-12 DIAGNOSIS — G4733 Obstructive sleep apnea (adult) (pediatric): Secondary | ICD-10-CM | POA: Insufficient documentation

## 2019-07-12 DIAGNOSIS — Z8673 Personal history of transient ischemic attack (TIA), and cerebral infarction without residual deficits: Secondary | ICD-10-CM | POA: Diagnosis not present

## 2019-07-12 DIAGNOSIS — I2510 Atherosclerotic heart disease of native coronary artery without angina pectoris: Secondary | ICD-10-CM | POA: Diagnosis not present

## 2019-07-12 NOTE — Progress Notes (Signed)
ReDS Vest / Clip - 07/12/19 1400      ReDS Vest / Clip   Station Marker  B    Ruler Value  33    ReDS Value Range  (!) High volume overload    ReDS Actual Value  50    Anatomical Comments  sitting
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
## 2019-07-12 NOTE — Progress Notes (Signed)
  Echocardiogram 2D Echocardiogram has been performed.  Grace Bradford 07/12/2019, 2:21 PM

## 2019-07-12 NOTE — Patient Instructions (Signed)
Increase Torsemide to 50 mg Twice daily FOR 2 DAYS ONLY then back to 50 mg daily  Please call our office in July to schedule your follow up appointment  If you have any questions or concerns before your next appointment please send [place] a message through [place] or call our office at [phone].  At the [facility], you and your health needs are our priority. As part of our continuing mission to provide you with exceptional heart care, we have created designated Provider Care Teams. These Care Teams include your primary Cardiologist (physician) and Advanced Practice Providers (APPs- Physician Assistants and Nurse Practitioners) who all work together to provide you with the care you need, when you need it.   You may see any of the following providers on your designated Care Team at your next follow up: Marland Kitchen Dr Glori Bickers . Dr Loralie Champagne . Darrick Grinder, NP . Lyda Jester, PA . Audry Riles, PharmD   Please be sure to bring in all your medications bottles to every appointment.

## 2019-07-20 DIAGNOSIS — I10 Essential (primary) hypertension: Secondary | ICD-10-CM | POA: Diagnosis not present

## 2019-07-20 DIAGNOSIS — I5032 Chronic diastolic (congestive) heart failure: Secondary | ICD-10-CM | POA: Diagnosis not present

## 2019-07-24 DIAGNOSIS — I5022 Chronic systolic (congestive) heart failure: Secondary | ICD-10-CM | POA: Diagnosis not present

## 2019-07-24 DIAGNOSIS — I10 Essential (primary) hypertension: Secondary | ICD-10-CM | POA: Diagnosis not present

## 2019-07-24 DIAGNOSIS — I5032 Chronic diastolic (congestive) heart failure: Secondary | ICD-10-CM | POA: Diagnosis not present

## 2019-08-06 DIAGNOSIS — I5022 Chronic systolic (congestive) heart failure: Secondary | ICD-10-CM | POA: Diagnosis not present

## 2019-08-06 DIAGNOSIS — I5032 Chronic diastolic (congestive) heart failure: Secondary | ICD-10-CM | POA: Diagnosis not present

## 2019-08-06 DIAGNOSIS — I10 Essential (primary) hypertension: Secondary | ICD-10-CM | POA: Diagnosis not present

## 2019-08-08 DIAGNOSIS — E119 Type 2 diabetes mellitus without complications: Secondary | ICD-10-CM | POA: Diagnosis not present

## 2019-08-08 DIAGNOSIS — H40023 Open angle with borderline findings, high risk, bilateral: Secondary | ICD-10-CM | POA: Diagnosis not present

## 2019-08-08 DIAGNOSIS — Z961 Presence of intraocular lens: Secondary | ICD-10-CM | POA: Diagnosis not present

## 2019-08-08 DIAGNOSIS — H43393 Other vitreous opacities, bilateral: Secondary | ICD-10-CM | POA: Diagnosis not present

## 2019-08-15 DIAGNOSIS — M5126 Other intervertebral disc displacement, lumbar region: Secondary | ICD-10-CM | POA: Diagnosis not present

## 2019-08-15 DIAGNOSIS — M47817 Spondylosis without myelopathy or radiculopathy, lumbosacral region: Secondary | ICD-10-CM | POA: Diagnosis not present

## 2019-08-15 DIAGNOSIS — M5136 Other intervertebral disc degeneration, lumbar region: Secondary | ICD-10-CM | POA: Diagnosis not present

## 2019-08-17 DIAGNOSIS — I5022 Chronic systolic (congestive) heart failure: Secondary | ICD-10-CM | POA: Diagnosis not present

## 2019-08-17 DIAGNOSIS — I10 Essential (primary) hypertension: Secondary | ICD-10-CM | POA: Diagnosis not present

## 2019-08-17 DIAGNOSIS — I5032 Chronic diastolic (congestive) heart failure: Secondary | ICD-10-CM | POA: Diagnosis not present

## 2019-08-20 DIAGNOSIS — M5136 Other intervertebral disc degeneration, lumbar region: Secondary | ICD-10-CM | POA: Diagnosis not present

## 2019-08-20 DIAGNOSIS — M5126 Other intervertebral disc displacement, lumbar region: Secondary | ICD-10-CM | POA: Diagnosis not present

## 2019-08-20 DIAGNOSIS — G894 Chronic pain syndrome: Secondary | ICD-10-CM | POA: Diagnosis not present

## 2019-08-20 DIAGNOSIS — M47816 Spondylosis without myelopathy or radiculopathy, lumbar region: Secondary | ICD-10-CM | POA: Diagnosis not present

## 2019-08-30 ENCOUNTER — Ambulatory Visit (HOSPITAL_COMMUNITY)
Admission: EM | Admit: 2019-08-30 | Discharge: 2019-08-30 | Disposition: A | Discharge status: Home / Self Care | Payer: Medicare Other | Attending: Internal Medicine | Admitting: Internal Medicine

## 2019-08-30 ENCOUNTER — Encounter (HOSPITAL_COMMUNITY): Payer: Self-pay

## 2019-08-30 ENCOUNTER — Other Ambulatory Visit: Payer: Self-pay

## 2019-08-30 DIAGNOSIS — Z79899 Other long term (current) drug therapy: Secondary | ICD-10-CM | POA: Diagnosis not present

## 2019-08-30 DIAGNOSIS — Z794 Long term (current) use of insulin: Secondary | ICD-10-CM | POA: Diagnosis not present

## 2019-08-30 DIAGNOSIS — S00412A Abrasion of left ear, initial encounter: Secondary | ICD-10-CM | POA: Insufficient documentation

## 2019-08-30 DIAGNOSIS — R42 Dizziness and giddiness: Secondary | ICD-10-CM | POA: Diagnosis not present

## 2019-08-30 DIAGNOSIS — I2510 Atherosclerotic heart disease of native coronary artery without angina pectoris: Secondary | ICD-10-CM | POA: Diagnosis not present

## 2019-08-30 DIAGNOSIS — M109 Gout, unspecified: Secondary | ICD-10-CM | POA: Diagnosis not present

## 2019-08-30 DIAGNOSIS — I130 Hypertensive heart and chronic kidney disease with heart failure and stage 1 through stage 4 chronic kidney disease, or unspecified chronic kidney disease: Secondary | ICD-10-CM | POA: Insufficient documentation

## 2019-08-30 DIAGNOSIS — Z833 Family history of diabetes mellitus: Secondary | ICD-10-CM | POA: Diagnosis not present

## 2019-08-30 DIAGNOSIS — E1122 Type 2 diabetes mellitus with diabetic chronic kidney disease: Secondary | ICD-10-CM | POA: Insufficient documentation

## 2019-08-30 DIAGNOSIS — G4733 Obstructive sleep apnea (adult) (pediatric): Secondary | ICD-10-CM | POA: Insufficient documentation

## 2019-08-30 DIAGNOSIS — Z20822 Contact with and (suspected) exposure to covid-19: Secondary | ICD-10-CM | POA: Insufficient documentation

## 2019-08-30 DIAGNOSIS — I429 Cardiomyopathy, unspecified: Secondary | ICD-10-CM | POA: Insufficient documentation

## 2019-08-30 DIAGNOSIS — B349 Viral infection, unspecified: Secondary | ICD-10-CM | POA: Diagnosis not present

## 2019-08-30 DIAGNOSIS — Z8249 Family history of ischemic heart disease and other diseases of the circulatory system: Secondary | ICD-10-CM | POA: Insufficient documentation

## 2019-08-30 DIAGNOSIS — N184 Chronic kidney disease, stage 4 (severe): Secondary | ICD-10-CM | POA: Insufficient documentation

## 2019-08-30 DIAGNOSIS — K219 Gastro-esophageal reflux disease without esophagitis: Secondary | ICD-10-CM | POA: Insufficient documentation

## 2019-08-30 DIAGNOSIS — X58XXXA Exposure to other specified factors, initial encounter: Secondary | ICD-10-CM | POA: Diagnosis not present

## 2019-08-30 DIAGNOSIS — Z79890 Hormone replacement therapy (postmenopausal): Secondary | ICD-10-CM | POA: Insufficient documentation

## 2019-08-30 DIAGNOSIS — Z8673 Personal history of transient ischemic attack (TIA), and cerebral infarction without residual deficits: Secondary | ICD-10-CM | POA: Diagnosis not present

## 2019-08-30 DIAGNOSIS — I5042 Chronic combined systolic (congestive) and diastolic (congestive) heart failure: Secondary | ICD-10-CM | POA: Insufficient documentation

## 2019-08-30 DIAGNOSIS — I252 Old myocardial infarction: Secondary | ICD-10-CM | POA: Insufficient documentation

## 2019-08-30 DIAGNOSIS — Z7982 Long term (current) use of aspirin: Secondary | ICD-10-CM | POA: Insufficient documentation

## 2019-08-30 DIAGNOSIS — S00411A Abrasion of right ear, initial encounter: Secondary | ICD-10-CM | POA: Diagnosis not present

## 2019-08-30 DIAGNOSIS — Z7951 Long term (current) use of inhaled steroids: Secondary | ICD-10-CM | POA: Insufficient documentation

## 2019-08-30 DIAGNOSIS — R05 Cough: Secondary | ICD-10-CM | POA: Insufficient documentation

## 2019-08-30 DIAGNOSIS — Z881 Allergy status to other antibiotic agents status: Secondary | ICD-10-CM | POA: Insufficient documentation

## 2019-08-30 MED ORDER — BACITRACIN ZINC 500 UNIT/GM EX OINT: 1.0000 "application " | TOPICAL_OINTMENT | Freq: Two times a day (BID) | CUTANEOUS | 0 refills | Status: DC

## 2019-08-30 NOTE — ED Triage Notes (Signed)
Pt c/o bilat ear pain, scratchy throat, cough, dizziness for approx 3 days. Denies blurred vision, slurred speech or other neuro complaint, negative for CP, SOB. Also reports diarrhea for 2 days at start of week.   States she was recently eval/tx for "sinus infection".  Denies fever, abdom pain, n/v. Reports taking both COVID vaccines.
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
## 2019-08-30 NOTE — Discharge Instructions (Addendum)
Quarantine until covid-19 results are available.

## 2019-08-31 LAB — SARS CORONAVIRUS 2 (TAT 6-24 HRS): SARS Coronavirus 2: NEGATIVE

## 2019-09-04 ENCOUNTER — Other Ambulatory Visit (HOSPITAL_COMMUNITY): Payer: Self-pay

## 2019-09-04 MED ORDER — AMLODIPINE BESYLATE 5 MG PO TABS: ORAL_TABLET | ORAL | 1 refills | Status: DC

## 2019-09-07 ENCOUNTER — Other Ambulatory Visit (HOSPITAL_COMMUNITY): Payer: Self-pay

## 2019-09-07 MED ORDER — SILDENAFIL CITRATE 20 MG PO TABS: 60.0000 mg | ORAL_TABLET | Freq: Three times a day (TID) | ORAL | 6 refills | Status: DC

## 2019-09-17 DIAGNOSIS — M47816 Spondylosis without myelopathy or radiculopathy, lumbar region: Secondary | ICD-10-CM | POA: Diagnosis not present

## 2019-09-17 DIAGNOSIS — G894 Chronic pain syndrome: Secondary | ICD-10-CM | POA: Diagnosis not present

## 2019-09-17 DIAGNOSIS — M5136 Other intervertebral disc degeneration, lumbar region: Secondary | ICD-10-CM | POA: Diagnosis not present

## 2019-09-17 DIAGNOSIS — M5126 Other intervertebral disc displacement, lumbar region: Secondary | ICD-10-CM | POA: Diagnosis not present

## 2019-09-19 DIAGNOSIS — G4733 Obstructive sleep apnea (adult) (pediatric): Secondary | ICD-10-CM | POA: Diagnosis not present

## 2019-09-19 DIAGNOSIS — Z8673 Personal history of transient ischemic attack (TIA), and cerebral infarction without residual deficits: Secondary | ICD-10-CM | POA: Diagnosis not present

## 2019-09-19 DIAGNOSIS — E039 Hypothyroidism, unspecified: Secondary | ICD-10-CM | POA: Diagnosis not present

## 2019-09-19 DIAGNOSIS — E7800 Pure hypercholesterolemia, unspecified: Secondary | ICD-10-CM | POA: Diagnosis not present

## 2019-09-19 DIAGNOSIS — E118 Type 2 diabetes mellitus with unspecified complications: Secondary | ICD-10-CM | POA: Diagnosis not present

## 2019-09-19 DIAGNOSIS — Z7189 Other specified counseling: Secondary | ICD-10-CM | POA: Diagnosis not present

## 2019-09-19 DIAGNOSIS — N184 Chronic kidney disease, stage 4 (severe): Secondary | ICD-10-CM | POA: Diagnosis not present

## 2019-09-19 DIAGNOSIS — I10 Essential (primary) hypertension: Secondary | ICD-10-CM | POA: Diagnosis not present

## 2019-09-19 DIAGNOSIS — I509 Heart failure, unspecified: Secondary | ICD-10-CM | POA: Diagnosis not present

## 2019-10-08 ENCOUNTER — Telehealth (HOSPITAL_COMMUNITY): Payer: Self-pay

## 2019-10-08 NOTE — Telephone Encounter (Signed)
Grace Bradford with home health called 10/05/19 to give an update on patient. She stated that Grace Bradford has been doing well other then having a 4lbs weight gain last week however she did take a dose of metolazone on Friday so that should help. Denies any cardiac symptoms. Suppose to have a bmet drawn in 2 weeks. Will send [place] those labs   CB#[phone]

## 2019-10-15 ENCOUNTER — Other Ambulatory Visit (HOSPITAL_COMMUNITY): Payer: Self-pay | Admitting: *Deleted

## 2019-10-15 DIAGNOSIS — G894 Chronic pain syndrome: Secondary | ICD-10-CM | POA: Diagnosis not present

## 2019-10-15 DIAGNOSIS — I5022 Chronic systolic (congestive) heart failure: Secondary | ICD-10-CM

## 2019-10-15 DIAGNOSIS — M5136 Other intervertebral disc degeneration, lumbar region: Secondary | ICD-10-CM | POA: Diagnosis not present

## 2019-10-15 DIAGNOSIS — M5126 Other intervertebral disc displacement, lumbar region: Secondary | ICD-10-CM | POA: Diagnosis not present

## 2019-10-15 DIAGNOSIS — M47816 Spondylosis without myelopathy or radiculopathy, lumbar region: Secondary | ICD-10-CM | POA: Diagnosis not present

## 2019-10-15 MED ORDER — CARVEDILOL 6.25 MG PO TABS: 6.2500 mg | ORAL_TABLET | Freq: Two times a day (BID) | ORAL | 3 refills | Status: DC

## 2019-11-12 DIAGNOSIS — I10 Essential (primary) hypertension: Secondary | ICD-10-CM | POA: Diagnosis not present

## 2019-11-12 DIAGNOSIS — I509 Heart failure, unspecified: Secondary | ICD-10-CM | POA: Diagnosis not present

## 2019-11-12 DIAGNOSIS — Z79899 Other long term (current) drug therapy: Secondary | ICD-10-CM | POA: Diagnosis not present

## 2019-11-12 DIAGNOSIS — E7800 Pure hypercholesterolemia, unspecified: Secondary | ICD-10-CM | POA: Diagnosis not present

## 2019-11-12 DIAGNOSIS — N184 Chronic kidney disease, stage 4 (severe): Secondary | ICD-10-CM | POA: Diagnosis not present

## 2019-11-12 DIAGNOSIS — M1A09X0 Idiopathic chronic gout, multiple sites, without tophus (tophi): Secondary | ICD-10-CM | POA: Diagnosis not present

## 2019-11-12 DIAGNOSIS — E039 Hypothyroidism, unspecified: Secondary | ICD-10-CM | POA: Diagnosis not present

## 2019-11-12 DIAGNOSIS — K219 Gastro-esophageal reflux disease without esophagitis: Secondary | ICD-10-CM | POA: Diagnosis not present

## 2019-11-12 DIAGNOSIS — E118 Type 2 diabetes mellitus with unspecified complications: Secondary | ICD-10-CM | POA: Diagnosis not present

## 2019-11-12 DIAGNOSIS — M75100 Unspecified rotator cuff tear or rupture of unspecified shoulder, not specified as traumatic: Secondary | ICD-10-CM | POA: Diagnosis not present

## 2019-11-12 DIAGNOSIS — M150 Primary generalized (osteo)arthritis: Secondary | ICD-10-CM | POA: Diagnosis not present

## 2019-11-12 DIAGNOSIS — M545 Low back pain: Secondary | ICD-10-CM | POA: Diagnosis not present

## 2019-11-12 DIAGNOSIS — M8580 Other specified disorders of bone density and structure, unspecified site: Secondary | ICD-10-CM | POA: Diagnosis not present

## 2019-11-14 DIAGNOSIS — E1122 Type 2 diabetes mellitus with diabetic chronic kidney disease: Secondary | ICD-10-CM | POA: Diagnosis not present

## 2019-11-14 DIAGNOSIS — M5136 Other intervertebral disc degeneration, lumbar region: Secondary | ICD-10-CM | POA: Diagnosis not present

## 2019-11-14 DIAGNOSIS — Z79891 Long term (current) use of opiate analgesic: Secondary | ICD-10-CM | POA: Diagnosis not present

## 2019-11-14 DIAGNOSIS — D649 Anemia, unspecified: Secondary | ICD-10-CM | POA: Diagnosis not present

## 2019-11-14 DIAGNOSIS — G894 Chronic pain syndrome: Secondary | ICD-10-CM | POA: Diagnosis not present

## 2019-11-14 DIAGNOSIS — N183 Chronic kidney disease, stage 3 unspecified: Secondary | ICD-10-CM | POA: Diagnosis not present

## 2019-11-14 DIAGNOSIS — I129 Hypertensive chronic kidney disease with stage 1 through stage 4 chronic kidney disease, or unspecified chronic kidney disease: Secondary | ICD-10-CM | POA: Diagnosis not present

## 2019-11-14 DIAGNOSIS — I2721 Secondary pulmonary arterial hypertension: Secondary | ICD-10-CM | POA: Diagnosis not present

## 2019-11-14 DIAGNOSIS — N2581 Secondary hyperparathyroidism of renal origin: Secondary | ICD-10-CM | POA: Diagnosis not present

## 2019-11-14 DIAGNOSIS — E876 Hypokalemia: Secondary | ICD-10-CM | POA: Diagnosis not present

## 2019-11-14 DIAGNOSIS — Z79899 Other long term (current) drug therapy: Secondary | ICD-10-CM | POA: Diagnosis not present

## 2019-11-14 DIAGNOSIS — M5126 Other intervertebral disc displacement, lumbar region: Secondary | ICD-10-CM | POA: Diagnosis not present

## 2019-11-14 DIAGNOSIS — M47816 Spondylosis without myelopathy or radiculopathy, lumbar region: Secondary | ICD-10-CM | POA: Diagnosis not present

## 2019-11-28 DIAGNOSIS — M5126 Other intervertebral disc displacement, lumbar region: Secondary | ICD-10-CM | POA: Diagnosis not present

## 2019-11-28 DIAGNOSIS — M5136 Other intervertebral disc degeneration, lumbar region: Secondary | ICD-10-CM | POA: Diagnosis not present

## 2019-11-28 DIAGNOSIS — M47817 Spondylosis without myelopathy or radiculopathy, lumbosacral region: Secondary | ICD-10-CM | POA: Diagnosis not present

## 2019-11-28 DIAGNOSIS — I5032 Chronic diastolic (congestive) heart failure: Secondary | ICD-10-CM | POA: Diagnosis not present

## 2019-11-28 DIAGNOSIS — I5022 Chronic systolic (congestive) heart failure: Secondary | ICD-10-CM | POA: Diagnosis not present

## 2019-11-28 DIAGNOSIS — I10 Essential (primary) hypertension: Secondary | ICD-10-CM | POA: Diagnosis not present

## 2019-11-29 DIAGNOSIS — M25561 Pain in right knee: Secondary | ICD-10-CM | POA: Diagnosis not present

## 2019-11-29 DIAGNOSIS — M1712 Unilateral primary osteoarthritis, left knee: Secondary | ICD-10-CM | POA: Diagnosis not present

## 2019-11-29 DIAGNOSIS — N184 Chronic kidney disease, stage 4 (severe): Secondary | ICD-10-CM | POA: Diagnosis not present

## 2019-11-29 DIAGNOSIS — I509 Heart failure, unspecified: Secondary | ICD-10-CM | POA: Diagnosis not present

## 2019-11-29 DIAGNOSIS — M150 Primary generalized (osteo)arthritis: Secondary | ICD-10-CM | POA: Diagnosis not present

## 2019-11-29 DIAGNOSIS — M170 Bilateral primary osteoarthritis of knee: Secondary | ICD-10-CM | POA: Diagnosis not present

## 2019-11-29 DIAGNOSIS — M109 Gout, unspecified: Secondary | ICD-10-CM | POA: Diagnosis not present

## 2019-11-29 DIAGNOSIS — M79644 Pain in right finger(s): Secondary | ICD-10-CM | POA: Diagnosis not present

## 2019-11-29 DIAGNOSIS — M549 Dorsalgia, unspecified: Secondary | ICD-10-CM | POA: Diagnosis not present

## 2019-11-29 DIAGNOSIS — M1A09X0 Idiopathic chronic gout, multiple sites, without tophus (tophi): Secondary | ICD-10-CM | POA: Diagnosis not present

## 2019-11-30 DIAGNOSIS — I5032 Chronic diastolic (congestive) heart failure: Secondary | ICD-10-CM | POA: Diagnosis not present

## 2019-11-30 DIAGNOSIS — I10 Essential (primary) hypertension: Secondary | ICD-10-CM | POA: Diagnosis not present

## 2019-11-30 DIAGNOSIS — I5022 Chronic systolic (congestive) heart failure: Secondary | ICD-10-CM | POA: Diagnosis not present

## 2019-12-03 DIAGNOSIS — D631 Anemia in chronic kidney disease: Secondary | ICD-10-CM | POA: Diagnosis not present

## 2019-12-03 DIAGNOSIS — N183 Chronic kidney disease, stage 3 unspecified: Secondary | ICD-10-CM | POA: Diagnosis not present

## 2019-12-03 DIAGNOSIS — E876 Hypokalemia: Secondary | ICD-10-CM | POA: Diagnosis not present

## 2019-12-03 DIAGNOSIS — I129 Hypertensive chronic kidney disease with stage 1 through stage 4 chronic kidney disease, or unspecified chronic kidney disease: Secondary | ICD-10-CM | POA: Diagnosis not present

## 2019-12-03 DIAGNOSIS — N2581 Secondary hyperparathyroidism of renal origin: Secondary | ICD-10-CM | POA: Diagnosis not present

## 2019-12-03 DIAGNOSIS — I2721 Secondary pulmonary arterial hypertension: Secondary | ICD-10-CM | POA: Diagnosis not present

## 2019-12-06 ENCOUNTER — Other Ambulatory Visit: Payer: Self-pay | Admitting: Internal Medicine

## 2019-12-06 DIAGNOSIS — H61113 Acquired deformity of pinna, bilateral: Secondary | ICD-10-CM | POA: Diagnosis not present

## 2019-12-14 ENCOUNTER — Other Ambulatory Visit: Payer: Self-pay | Admitting: Cardiology

## 2019-12-14 DIAGNOSIS — M5136 Other intervertebral disc degeneration, lumbar region: Secondary | ICD-10-CM | POA: Diagnosis not present

## 2019-12-14 DIAGNOSIS — M47816 Spondylosis without myelopathy or radiculopathy, lumbar region: Secondary | ICD-10-CM | POA: Diagnosis not present

## 2019-12-14 DIAGNOSIS — G894 Chronic pain syndrome: Secondary | ICD-10-CM | POA: Diagnosis not present

## 2019-12-14 DIAGNOSIS — M5416 Radiculopathy, lumbar region: Secondary | ICD-10-CM | POA: Diagnosis not present

## 2019-12-14 MED ORDER — ENTRESTO 97-103 MG PO TABS: 1.0000 | ORAL_TABLET | Freq: Two times a day (BID) | ORAL | 5 refills | Status: DC

## 2019-12-17 ENCOUNTER — Telehealth (HOSPITAL_COMMUNITY): Payer: Self-pay | Admitting: Pharmacy Technician

## 2019-12-17 NOTE — Telephone Encounter (Signed)
Advanced Heart Failure Patient Advocate Encounter  Prior Authorization for Corlanor 7.5mg  has been approved.    Effective dates: 11/17/19 through 12/16/20  Patients co-pay is $0.  Charlann Boxer, CPhT

## 2020-01-04 DIAGNOSIS — I5032 Chronic diastolic (congestive) heart failure: Secondary | ICD-10-CM | POA: Diagnosis not present

## 2020-01-04 DIAGNOSIS — I5022 Chronic systolic (congestive) heart failure: Secondary | ICD-10-CM | POA: Diagnosis not present

## 2020-01-04 DIAGNOSIS — I272 Pulmonary hypertension, unspecified: Secondary | ICD-10-CM | POA: Diagnosis not present

## 2020-01-04 DIAGNOSIS — Z1159 Encounter for screening for other viral diseases: Secondary | ICD-10-CM | POA: Diagnosis not present

## 2020-01-07 ENCOUNTER — Telehealth: Payer: Self-pay | Admitting: Physician Assistant

## 2020-01-07 DIAGNOSIS — J984 Other disorders of lung: Secondary | ICD-10-CM | POA: Diagnosis not present

## 2020-01-07 NOTE — Telephone Encounter (Signed)
Called to discuss with patient about Covid symptoms and the use of casirivimab/imdevimab, a monoclonal antibody infusion for those with mild to moderate Covid symptoms and at a high risk of hospitalization.  Pt is qualified for this infusion at the [facility] due to; Specific high risk criteria : Older age (>/= 74 yo), BMI > 25, Diabetes and Cardiovascular disease or hypertension   Message left to call back our hotline [phone].  Angelena Form PA-C  [facility]

## 2020-01-18 DIAGNOSIS — R918 Other nonspecific abnormal finding of lung field: Secondary | ICD-10-CM | POA: Diagnosis not present

## 2020-02-05 DIAGNOSIS — H61113 Acquired deformity of pinna, bilateral: Secondary | ICD-10-CM | POA: Diagnosis not present

## 2020-02-06 DIAGNOSIS — M5416 Radiculopathy, lumbar region: Secondary | ICD-10-CM | POA: Diagnosis not present

## 2020-02-06 DIAGNOSIS — G894 Chronic pain syndrome: Secondary | ICD-10-CM | POA: Diagnosis not present

## 2020-02-06 DIAGNOSIS — M5126 Other intervertebral disc displacement, lumbar region: Secondary | ICD-10-CM | POA: Diagnosis not present

## 2020-02-06 DIAGNOSIS — M5136 Other intervertebral disc degeneration, lumbar region: Secondary | ICD-10-CM | POA: Diagnosis not present

## 2020-02-07 DIAGNOSIS — N2581 Secondary hyperparathyroidism of renal origin: Secondary | ICD-10-CM | POA: Diagnosis not present

## 2020-02-07 DIAGNOSIS — I2721 Secondary pulmonary arterial hypertension: Secondary | ICD-10-CM | POA: Diagnosis not present

## 2020-02-07 DIAGNOSIS — E1122 Type 2 diabetes mellitus with diabetic chronic kidney disease: Secondary | ICD-10-CM | POA: Diagnosis not present

## 2020-02-07 DIAGNOSIS — D631 Anemia in chronic kidney disease: Secondary | ICD-10-CM | POA: Diagnosis not present

## 2020-02-07 DIAGNOSIS — N1831 Chronic kidney disease, stage 3a: Secondary | ICD-10-CM | POA: Diagnosis not present

## 2020-02-07 DIAGNOSIS — N189 Chronic kidney disease, unspecified: Secondary | ICD-10-CM | POA: Diagnosis not present

## 2020-02-07 DIAGNOSIS — N183 Chronic kidney disease, stage 3 unspecified: Secondary | ICD-10-CM | POA: Diagnosis not present

## 2020-02-14 DIAGNOSIS — H40023 Open angle with borderline findings, high risk, bilateral: Secondary | ICD-10-CM | POA: Diagnosis not present

## 2020-02-14 DIAGNOSIS — H1131 Conjunctival hemorrhage, right eye: Secondary | ICD-10-CM | POA: Diagnosis not present

## 2020-02-19 DIAGNOSIS — I509 Heart failure, unspecified: Secondary | ICD-10-CM | POA: Diagnosis not present

## 2020-02-19 DIAGNOSIS — E118 Type 2 diabetes mellitus with unspecified complications: Secondary | ICD-10-CM | POA: Diagnosis not present

## 2020-02-19 DIAGNOSIS — Z8673 Personal history of transient ischemic attack (TIA), and cerebral infarction without residual deficits: Secondary | ICD-10-CM | POA: Diagnosis not present

## 2020-02-19 DIAGNOSIS — G4733 Obstructive sleep apnea (adult) (pediatric): Secondary | ICD-10-CM | POA: Diagnosis not present

## 2020-02-19 DIAGNOSIS — Z8616 Personal history of COVID-19: Secondary | ICD-10-CM | POA: Diagnosis not present

## 2020-02-19 DIAGNOSIS — E7800 Pure hypercholesterolemia, unspecified: Secondary | ICD-10-CM | POA: Diagnosis not present

## 2020-02-19 DIAGNOSIS — Z6837 Body mass index (BMI) 37.0-37.9, adult: Secondary | ICD-10-CM | POA: Diagnosis not present

## 2020-02-19 DIAGNOSIS — E039 Hypothyroidism, unspecified: Secondary | ICD-10-CM | POA: Diagnosis not present

## 2020-02-19 DIAGNOSIS — I10 Essential (primary) hypertension: Secondary | ICD-10-CM | POA: Diagnosis not present

## 2020-02-19 DIAGNOSIS — N184 Chronic kidney disease, stage 4 (severe): Secondary | ICD-10-CM | POA: Diagnosis not present

## 2020-02-27 DIAGNOSIS — Z1231 Encounter for screening mammogram for malignant neoplasm of breast: Secondary | ICD-10-CM | POA: Diagnosis not present

## 2020-03-03 DIAGNOSIS — M5416 Radiculopathy, lumbar region: Secondary | ICD-10-CM | POA: Diagnosis not present

## 2020-03-03 DIAGNOSIS — M5126 Other intervertebral disc displacement, lumbar region: Secondary | ICD-10-CM | POA: Diagnosis not present

## 2020-03-03 DIAGNOSIS — M5136 Other intervertebral disc degeneration, lumbar region: Secondary | ICD-10-CM | POA: Diagnosis not present

## 2020-03-13 DIAGNOSIS — Z79891 Long term (current) use of opiate analgesic: Secondary | ICD-10-CM | POA: Diagnosis not present

## 2020-03-13 DIAGNOSIS — E118 Type 2 diabetes mellitus with unspecified complications: Secondary | ICD-10-CM | POA: Diagnosis not present

## 2020-03-13 DIAGNOSIS — E7800 Pure hypercholesterolemia, unspecified: Secondary | ICD-10-CM | POA: Diagnosis not present

## 2020-03-13 DIAGNOSIS — E039 Hypothyroidism, unspecified: Secondary | ICD-10-CM | POA: Diagnosis not present

## 2020-03-13 DIAGNOSIS — M1A09X0 Idiopathic chronic gout, multiple sites, without tophus (tophi): Secondary | ICD-10-CM | POA: Diagnosis not present

## 2020-03-13 DIAGNOSIS — I10 Essential (primary) hypertension: Secondary | ICD-10-CM | POA: Diagnosis not present

## 2020-03-14 DIAGNOSIS — G629 Polyneuropathy, unspecified: Secondary | ICD-10-CM | POA: Diagnosis not present

## 2020-03-14 DIAGNOSIS — E7800 Pure hypercholesterolemia, unspecified: Secondary | ICD-10-CM | POA: Diagnosis not present

## 2020-03-14 DIAGNOSIS — E118 Type 2 diabetes mellitus with unspecified complications: Secondary | ICD-10-CM | POA: Diagnosis not present

## 2020-03-14 DIAGNOSIS — M170 Bilateral primary osteoarthritis of knee: Secondary | ICD-10-CM | POA: Diagnosis not present

## 2020-03-14 DIAGNOSIS — K5790 Diverticulosis of intestine, part unspecified, without perforation or abscess without bleeding: Secondary | ICD-10-CM | POA: Diagnosis not present

## 2020-03-14 DIAGNOSIS — K219 Gastro-esophageal reflux disease without esophagitis: Secondary | ICD-10-CM | POA: Diagnosis not present

## 2020-03-14 DIAGNOSIS — N184 Chronic kidney disease, stage 4 (severe): Secondary | ICD-10-CM | POA: Diagnosis not present

## 2020-03-14 DIAGNOSIS — M109 Gout, unspecified: Secondary | ICD-10-CM | POA: Diagnosis not present

## 2020-03-14 DIAGNOSIS — I10 Essential (primary) hypertension: Secondary | ICD-10-CM | POA: Diagnosis not present

## 2020-03-14 DIAGNOSIS — I509 Heart failure, unspecified: Secondary | ICD-10-CM | POA: Diagnosis not present

## 2020-03-14 DIAGNOSIS — E039 Hypothyroidism, unspecified: Secondary | ICD-10-CM | POA: Diagnosis not present

## 2020-03-14 DIAGNOSIS — H61899 Other specified disorders of external ear, unspecified ear: Secondary | ICD-10-CM | POA: Diagnosis not present

## 2020-03-19 ENCOUNTER — Other Ambulatory Visit: Payer: Self-pay | Admitting: Internal Medicine

## 2020-03-21 ENCOUNTER — Other Ambulatory Visit: Payer: Self-pay | Admitting: Internal Medicine

## 2020-03-24 DIAGNOSIS — H6193 Disorder of external ear, unspecified, bilateral: Secondary | ICD-10-CM | POA: Diagnosis not present

## 2020-03-26 ENCOUNTER — Telehealth (HOSPITAL_COMMUNITY): Payer: Self-pay | Admitting: *Deleted

## 2020-03-26 NOTE — Telephone Encounter (Signed)
Linda with [facility], [facility] Th[facility] left VM asking if it is ok to hold plavix for an external biopsy of the ear. If yes, how long should plavix be held?  Routed to [place]   Call back # [phone]

## 2020-03-27 NOTE — Telephone Encounter (Signed)
Yes

## 2020-03-28 ENCOUNTER — Other Ambulatory Visit (HOSPITAL_COMMUNITY): Payer: Self-pay

## 2020-03-28 MED ORDER — SILDENAFIL CITRATE 20 MG PO TABS: 60.0000 mg | ORAL_TABLET | Freq: Three times a day (TID) | ORAL | 1 refills | Status: DC

## 2020-04-01 DIAGNOSIS — L281 Prurigo nodularis: Secondary | ICD-10-CM | POA: Diagnosis not present

## 2020-04-02 DIAGNOSIS — Z79899 Other long term (current) drug therapy: Secondary | ICD-10-CM | POA: Diagnosis not present

## 2020-04-02 DIAGNOSIS — M47817 Spondylosis without myelopathy or radiculopathy, lumbosacral region: Secondary | ICD-10-CM | POA: Diagnosis not present

## 2020-04-02 DIAGNOSIS — G894 Chronic pain syndrome: Secondary | ICD-10-CM | POA: Diagnosis not present

## 2020-04-02 DIAGNOSIS — M5136 Other intervertebral disc degeneration, lumbar region: Secondary | ICD-10-CM | POA: Diagnosis not present

## 2020-04-02 DIAGNOSIS — Z79891 Long term (current) use of opiate analgesic: Secondary | ICD-10-CM | POA: Diagnosis not present

## 2020-04-02 DIAGNOSIS — M5126 Other intervertebral disc displacement, lumbar region: Secondary | ICD-10-CM | POA: Diagnosis not present

## 2020-04-10 ENCOUNTER — Other Ambulatory Visit (HOSPITAL_COMMUNITY): Payer: Self-pay | Admitting: Internal Medicine

## 2020-04-18 ENCOUNTER — Other Ambulatory Visit (HOSPITAL_COMMUNITY): Payer: Self-pay | Admitting: Internal Medicine

## 2020-04-21 ENCOUNTER — Telehealth (HOSPITAL_COMMUNITY): Payer: Self-pay | Admitting: Vascular Surgery

## 2020-04-21 NOTE — Telephone Encounter (Signed)
Left pt message giving appt w/ db 2/9, asked pt to call back to confirm appt

## 2020-04-22 DIAGNOSIS — L281 Prurigo nodularis: Secondary | ICD-10-CM | POA: Diagnosis not present

## 2020-04-30 ENCOUNTER — Other Ambulatory Visit (HOSPITAL_COMMUNITY): Payer: Self-pay | Admitting: Internal Medicine

## 2020-05-01 ENCOUNTER — Other Ambulatory Visit (HOSPITAL_COMMUNITY): Payer: Self-pay | Admitting: Internal Medicine

## 2020-05-18 ENCOUNTER — Other Ambulatory Visit: Payer: Self-pay | Admitting: Cardiology

## 2020-06-03 DIAGNOSIS — M5126 Other intervertebral disc displacement, lumbar region: Secondary | ICD-10-CM | POA: Diagnosis not present

## 2020-06-03 DIAGNOSIS — M5136 Other intervertebral disc degeneration, lumbar region: Secondary | ICD-10-CM | POA: Diagnosis not present

## 2020-06-03 DIAGNOSIS — Z79899 Other long term (current) drug therapy: Secondary | ICD-10-CM | POA: Diagnosis not present

## 2020-06-03 DIAGNOSIS — Z79891 Long term (current) use of opiate analgesic: Secondary | ICD-10-CM | POA: Diagnosis not present

## 2020-06-03 DIAGNOSIS — M47816 Spondylosis without myelopathy or radiculopathy, lumbar region: Secondary | ICD-10-CM | POA: Diagnosis not present

## 2020-06-03 DIAGNOSIS — G894 Chronic pain syndrome: Secondary | ICD-10-CM | POA: Diagnosis not present

## 2020-06-04 ENCOUNTER — Encounter (HOSPITAL_COMMUNITY): Payer: Self-pay | Admitting: Internal Medicine

## 2020-06-04 ENCOUNTER — Ambulatory Visit (HOSPITAL_COMMUNITY)
Admission: RE | Admit: 2020-06-04 | Discharge: 2020-06-04 | Disposition: A | Discharge status: Home / Self Care | Payer: Medicare Other | Source: Ambulatory Visit | Attending: Internal Medicine | Admitting: Internal Medicine

## 2020-06-04 ENCOUNTER — Telehealth (HOSPITAL_COMMUNITY): Payer: Self-pay

## 2020-06-04 ENCOUNTER — Other Ambulatory Visit: Payer: Self-pay

## 2020-06-04 VITALS — BP 120/80 | HR 73 | Wt 186.0 lb

## 2020-06-04 DIAGNOSIS — I272 Pulmonary hypertension, unspecified: Secondary | ICD-10-CM | POA: Diagnosis not present

## 2020-06-04 DIAGNOSIS — Z7902 Long term (current) use of antithrombotics/antiplatelets: Secondary | ICD-10-CM | POA: Insufficient documentation

## 2020-06-04 DIAGNOSIS — E669 Obesity, unspecified: Secondary | ICD-10-CM | POA: Insufficient documentation

## 2020-06-04 DIAGNOSIS — Z7982 Long term (current) use of aspirin: Secondary | ICD-10-CM | POA: Insufficient documentation

## 2020-06-04 DIAGNOSIS — G4733 Obstructive sleep apnea (adult) (pediatric): Secondary | ICD-10-CM | POA: Diagnosis not present

## 2020-06-04 DIAGNOSIS — Z794 Long term (current) use of insulin: Secondary | ICD-10-CM | POA: Insufficient documentation

## 2020-06-04 DIAGNOSIS — I252 Old myocardial infarction: Secondary | ICD-10-CM | POA: Insufficient documentation

## 2020-06-04 DIAGNOSIS — I2510 Atherosclerotic heart disease of native coronary artery without angina pectoris: Secondary | ICD-10-CM | POA: Diagnosis not present

## 2020-06-04 DIAGNOSIS — E785 Hyperlipidemia, unspecified: Secondary | ICD-10-CM | POA: Diagnosis not present

## 2020-06-04 DIAGNOSIS — I2721 Secondary pulmonary arterial hypertension: Secondary | ICD-10-CM

## 2020-06-04 DIAGNOSIS — I5022 Chronic systolic (congestive) heart failure: Secondary | ICD-10-CM | POA: Insufficient documentation

## 2020-06-04 DIAGNOSIS — N184 Chronic kidney disease, stage 4 (severe): Secondary | ICD-10-CM | POA: Insufficient documentation

## 2020-06-04 DIAGNOSIS — Z79899 Other long term (current) drug therapy: Secondary | ICD-10-CM | POA: Insufficient documentation

## 2020-06-04 DIAGNOSIS — E1122 Type 2 diabetes mellitus with diabetic chronic kidney disease: Secondary | ICD-10-CM | POA: Diagnosis not present

## 2020-06-04 DIAGNOSIS — I130 Hypertensive heart and chronic kidney disease with heart failure and stage 1 through stage 4 chronic kidney disease, or unspecified chronic kidney disease: Secondary | ICD-10-CM | POA: Insufficient documentation

## 2020-06-04 DIAGNOSIS — K766 Portal hypertension: Secondary | ICD-10-CM | POA: Diagnosis not present

## 2020-06-04 DIAGNOSIS — Z8249 Family history of ischemic heart disease and other diseases of the circulatory system: Secondary | ICD-10-CM | POA: Diagnosis not present

## 2020-06-04 LAB — BASIC METABOLIC PANEL
Anion gap: 11 (ref 5–15)
BUN: 51 mg/dL — ABNORMAL HIGH (ref 8–23)
CO2: 26 mmol/L (ref 22–32)
Calcium: 14 mg/dL (ref 8.9–10.3)
Chloride: 103 mmol/L (ref 98–111)
Creatinine, Ser: 2.94 mg/dL — ABNORMAL HIGH (ref 0.44–1.00)
GFR, Estimated: 16 mL/min — ABNORMAL LOW (ref >60)
Glucose, Bld: 96 mg/dL (ref 70–99)
Potassium: 4.8 mmol/L (ref 3.5–5.1)
Sodium: 140 mmol/L (ref 135–145)

## 2020-06-04 LAB — BRAIN NATRIURETIC PEPTIDE: B Natriuretic Peptide: 202.4 pg/mL — ABNORMAL HIGH (ref 0.0–100.0)

## 2020-06-04 MED ORDER — DAPAGLIFLOZIN PROPANEDIOL 10 MG PO TABS: 10.0000 mg | ORAL_TABLET | Freq: Every day | ORAL | 6 refills | Status: DC

## 2020-06-04 NOTE — Addendum Note (Signed)
Encounter addended by: Jolaine Artist, MD on: 06/04/2020 3:11 PM  Actions taken: Clinical Note Signed

## 2020-06-04 NOTE — Telephone Encounter (Signed)
Lab called with critical calcium score of 14.0 per Dr. Glori Bickers patient needs to follow with her PCP or nephrologist ASAP. Tried calling patient at both numbers listed in chart but no one answered, left message to call office back.

## 2020-06-04 NOTE — Addendum Note (Signed)
Encounter addended by: Stanford Scotland, RN on: 06/04/2020 3:33 PM  Actions taken: Visit diagnoses modified, Diagnosis association updated, Charge Capture section accepted, Pharmacy for encounter modified, Order list changed, Clinical Note Signed

## 2020-06-04 NOTE — Patient Instructions (Addendum)
Labs done today, your results will be available in MyChart, we will contact you for abnormal readings  Start Farxiga 10mg  (1 tablet) Daily  Follow up in about 2 weeks for [facility]  Your physician recommends that you schedule a follow-up appointment in: 6 months with echocardiogram  Call our office to schedule an appointment   If you have any questions or concerns before your next appointment please send [place] a message through [facility] or call our office at [phone].    TO LEAVE A MESSAGE FOR THE NURSE SELECT OPTION 2, PLEASE LEAVE A MESSAGE INCLUDING: . YOUR NAME . DATE OF BIRTH . CALL BACK NUMBER . REASON FOR CALL**this is important as we prioritize the call backs  [place] AS LONG AS YOU CALL BEFORE 4:00 PM  At the [facility], you and your health needs are our priority. As part of our continuing mi  ssion to provide you with exceptional heart care, we have created designated Provider Care Teams. These Care Teams include your primary Cardiologist (physician) and Advanced Practice Providers (APPs- Physician Assistants and Nurse Practitioners) who all work together to provide you with the care you need, when you need it.   You may see any of the following providers on your designated Care Team at your next follow up: Marland Kitchen Dr Glori Bickers . Dr Loralie Champagne . Darrick Grinder, NP . Lyda Jester, [place] . Audry Riles, PharmD   Please be sure to bring in all your medications bottles to every appointment.
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
## 2020-06-13 ENCOUNTER — Telehealth (HOSPITAL_COMMUNITY): Payer: Self-pay | Admitting: Pharmacy Technician

## 2020-06-13 NOTE — Telephone Encounter (Signed)
Advanced Heart Failure Patient Advocate Encounter  Prior Authorization for [facility] [place] has been submitted and approved.    PA# [id number] Effective dates: 06/13/20 through 06/13/21  Patients co-pay is $15 (90 day)  Charlann Boxer, CPhT

## 2020-06-18 ENCOUNTER — Other Ambulatory Visit (HOSPITAL_COMMUNITY): Payer: Medicare Other

## 2020-06-18 DIAGNOSIS — E1122 Type 2 diabetes mellitus with diabetic chronic kidney disease: Secondary | ICD-10-CM | POA: Diagnosis not present

## 2020-06-18 DIAGNOSIS — N189 Chronic kidney disease, unspecified: Secondary | ICD-10-CM | POA: Diagnosis not present

## 2020-06-18 DIAGNOSIS — D631 Anemia in chronic kidney disease: Secondary | ICD-10-CM | POA: Diagnosis not present

## 2020-06-18 DIAGNOSIS — I2721 Secondary pulmonary arterial hypertension: Secondary | ICD-10-CM | POA: Diagnosis not present

## 2020-06-18 DIAGNOSIS — N1831 Chronic kidney disease, stage 3a: Secondary | ICD-10-CM | POA: Diagnosis not present

## 2020-06-19 ENCOUNTER — Inpatient Hospital Stay (HOSPITAL_COMMUNITY)
Admission: EM | Admit: 2020-06-19 | Discharge: 2020-06-24 | DRG: 683 | Disposition: A | Discharge status: Home / Self Care | Payer: Medicare Other | Attending: Internal Medicine | Admitting: Internal Medicine

## 2020-06-19 ENCOUNTER — Other Ambulatory Visit: Payer: Self-pay

## 2020-06-19 ENCOUNTER — Encounter (HOSPITAL_COMMUNITY): Payer: Self-pay | Admitting: Emergency Medicine

## 2020-06-19 DIAGNOSIS — I2510 Atherosclerotic heart disease of native coronary artery without angina pectoris: Secondary | ICD-10-CM | POA: Diagnosis present

## 2020-06-19 DIAGNOSIS — Z7902 Long term (current) use of antithrombotics/antiplatelets: Secondary | ICD-10-CM

## 2020-06-19 DIAGNOSIS — Z79899 Other long term (current) drug therapy: Secondary | ICD-10-CM

## 2020-06-19 DIAGNOSIS — E119 Type 2 diabetes mellitus without complications: Secondary | ICD-10-CM

## 2020-06-19 DIAGNOSIS — E1122 Type 2 diabetes mellitus with diabetic chronic kidney disease: Secondary | ICD-10-CM | POA: Diagnosis present

## 2020-06-19 DIAGNOSIS — E039 Hypothyroidism, unspecified: Secondary | ICD-10-CM | POA: Diagnosis present

## 2020-06-19 DIAGNOSIS — E860 Dehydration: Secondary | ICD-10-CM | POA: Diagnosis present

## 2020-06-19 DIAGNOSIS — D631 Anemia in chronic kidney disease: Secondary | ICD-10-CM | POA: Diagnosis present

## 2020-06-19 DIAGNOSIS — N179 Acute kidney failure, unspecified: Secondary | ICD-10-CM | POA: Diagnosis not present

## 2020-06-19 DIAGNOSIS — Z79890 Hormone replacement therapy (postmenopausal): Secondary | ICD-10-CM

## 2020-06-19 DIAGNOSIS — G4733 Obstructive sleep apnea (adult) (pediatric): Secondary | ICD-10-CM | POA: Diagnosis not present

## 2020-06-19 DIAGNOSIS — G4730 Sleep apnea, unspecified: Secondary | ICD-10-CM | POA: Diagnosis present

## 2020-06-19 DIAGNOSIS — I130 Hypertensive heart and chronic kidney disease with heart failure and stage 1 through stage 4 chronic kidney disease, or unspecified chronic kidney disease: Secondary | ICD-10-CM | POA: Diagnosis present

## 2020-06-19 DIAGNOSIS — E669 Obesity, unspecified: Secondary | ICD-10-CM | POA: Diagnosis present

## 2020-06-19 DIAGNOSIS — I428 Other cardiomyopathies: Secondary | ICD-10-CM

## 2020-06-19 DIAGNOSIS — Z7982 Long term (current) use of aspirin: Secondary | ICD-10-CM

## 2020-06-19 DIAGNOSIS — I255 Ischemic cardiomyopathy: Secondary | ICD-10-CM | POA: Diagnosis present

## 2020-06-19 DIAGNOSIS — Z20822 Contact with and (suspected) exposure to covid-19: Secondary | ICD-10-CM | POA: Diagnosis not present

## 2020-06-19 DIAGNOSIS — I5042 Chronic combined systolic (congestive) and diastolic (congestive) heart failure: Secondary | ICD-10-CM | POA: Diagnosis present

## 2020-06-19 DIAGNOSIS — M5431 Sciatica, right side: Secondary | ICD-10-CM | POA: Diagnosis present

## 2020-06-19 DIAGNOSIS — L281 Prurigo nodularis: Secondary | ICD-10-CM | POA: Diagnosis not present

## 2020-06-19 DIAGNOSIS — Z8673 Personal history of transient ischemic attack (TIA), and cerebral infarction without residual deficits: Secondary | ICD-10-CM

## 2020-06-19 DIAGNOSIS — K219 Gastro-esophageal reflux disease without esophagitis: Secondary | ICD-10-CM | POA: Diagnosis present

## 2020-06-19 DIAGNOSIS — I252 Old myocardial infarction: Secondary | ICD-10-CM

## 2020-06-19 DIAGNOSIS — Z794 Long term (current) use of insulin: Secondary | ICD-10-CM

## 2020-06-19 DIAGNOSIS — C9000 Multiple myeloma not having achieved remission: Secondary | ICD-10-CM

## 2020-06-19 DIAGNOSIS — N185 Chronic kidney disease, stage 5: Secondary | ICD-10-CM

## 2020-06-19 DIAGNOSIS — I2721 Secondary pulmonary arterial hypertension: Secondary | ICD-10-CM | POA: Diagnosis present

## 2020-06-19 DIAGNOSIS — R634 Abnormal weight loss: Secondary | ICD-10-CM | POA: Diagnosis present

## 2020-06-19 DIAGNOSIS — E1140 Type 2 diabetes mellitus with diabetic neuropathy, unspecified: Secondary | ICD-10-CM | POA: Diagnosis present

## 2020-06-19 DIAGNOSIS — E876 Hypokalemia: Secondary | ICD-10-CM | POA: Diagnosis present

## 2020-06-19 DIAGNOSIS — Z881 Allergy status to other antibiotic agents status: Secondary | ICD-10-CM

## 2020-06-19 DIAGNOSIS — K766 Portal hypertension: Secondary | ICD-10-CM | POA: Diagnosis not present

## 2020-06-19 DIAGNOSIS — Z6836 Body mass index (BMI) 36.0-36.9, adult: Secondary | ICD-10-CM

## 2020-06-19 DIAGNOSIS — Z833 Family history of diabetes mellitus: Secondary | ICD-10-CM

## 2020-06-19 DIAGNOSIS — Z8249 Family history of ischemic heart disease and other diseases of the circulatory system: Secondary | ICD-10-CM

## 2020-06-19 DIAGNOSIS — G8929 Other chronic pain: Secondary | ICD-10-CM | POA: Diagnosis present

## 2020-06-19 DIAGNOSIS — Z888 Allergy status to other drugs, medicaments and biological substances status: Secondary | ICD-10-CM

## 2020-06-19 DIAGNOSIS — N184 Chronic kidney disease, stage 4 (severe): Secondary | ICD-10-CM | POA: Diagnosis present

## 2020-06-19 LAB — BASIC METABOLIC PANEL
Anion gap: 11 (ref 5–15)
BUN: 80 mg/dL — ABNORMAL HIGH (ref 8–23)
CO2: 26 mmol/L (ref 22–32)
Calcium: 11.7 mg/dL — ABNORMAL HIGH (ref 8.9–10.3)
Chloride: 101 mmol/L (ref 98–111)
Creatinine, Ser: 4.53 mg/dL — ABNORMAL HIGH (ref 0.44–1.00)
GFR, Estimated: 10 mL/min — ABNORMAL LOW (ref >60)
Glucose, Bld: 120 mg/dL — ABNORMAL HIGH (ref 70–99)
Potassium: 4.3 mmol/L (ref 3.5–5.1)
Sodium: 138 mmol/L (ref 135–145)

## 2020-06-19 LAB — CBC
HCT: 32 % — ABNORMAL LOW (ref 36.0–46.0)
Hemoglobin: 10.2 g/dL — ABNORMAL LOW (ref 12.0–15.0)
MCH: 27.4 pg (ref 26.0–34.0)
MCHC: 31.9 g/dL (ref 30.0–36.0)
MCV: 86 fL (ref 80.0–100.0)
Platelets: 153 10*3/uL (ref 150–400)
RBC: 3.72 MIL/uL — ABNORMAL LOW (ref 3.87–5.11)
RDW: 16.1 % — ABNORMAL HIGH (ref 11.5–15.5)
WBC: 4.6 10*3/uL (ref 4.0–10.5)
nRBC: 0 % (ref 0.0–0.2)

## 2020-06-19 MED ORDER — SODIUM CHLORIDE 0.9 % IV BOLUS
500.0000 mL | Freq: Once | INTRAVENOUS | Status: AC
  Administered 2020-06-20: 500 mL via INTRAVENOUS

## 2020-06-19 NOTE — ED Triage Notes (Signed)
Pt states she was sent by her nephrologist for worsening kidney function. C/o fatigue.
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
## 2020-06-20 ENCOUNTER — Inpatient Hospital Stay (HOSPITAL_COMMUNITY): Payer: Medicare Other

## 2020-06-20 ENCOUNTER — Observation Stay (HOSPITAL_COMMUNITY): Payer: Medicare Other

## 2020-06-20 ENCOUNTER — Encounter (HOSPITAL_COMMUNITY): Payer: Self-pay | Admitting: Internal Medicine

## 2020-06-20 DIAGNOSIS — I2721 Secondary pulmonary arterial hypertension: Secondary | ICD-10-CM | POA: Diagnosis present

## 2020-06-20 DIAGNOSIS — M5136 Other intervertebral disc degeneration, lumbar region: Secondary | ICD-10-CM | POA: Diagnosis not present

## 2020-06-20 DIAGNOSIS — Z7902 Long term (current) use of antithrombotics/antiplatelets: Secondary | ICD-10-CM | POA: Diagnosis not present

## 2020-06-20 DIAGNOSIS — Z8673 Personal history of transient ischemic attack (TIA), and cerebral infarction without residual deficits: Secondary | ICD-10-CM | POA: Diagnosis not present

## 2020-06-20 DIAGNOSIS — E1122 Type 2 diabetes mellitus with diabetic chronic kidney disease: Secondary | ICD-10-CM

## 2020-06-20 DIAGNOSIS — R634 Abnormal weight loss: Secondary | ICD-10-CM | POA: Diagnosis not present

## 2020-06-20 DIAGNOSIS — N179 Acute kidney failure, unspecified: Secondary | ICD-10-CM

## 2020-06-20 DIAGNOSIS — Z79890 Hormone replacement therapy (postmenopausal): Secondary | ICD-10-CM | POA: Diagnosis not present

## 2020-06-20 DIAGNOSIS — C9000 Multiple myeloma not having achieved remission: Secondary | ICD-10-CM | POA: Diagnosis not present

## 2020-06-20 DIAGNOSIS — Z7982 Long term (current) use of aspirin: Secondary | ICD-10-CM | POA: Diagnosis not present

## 2020-06-20 DIAGNOSIS — N185 Chronic kidney disease, stage 5: Secondary | ICD-10-CM | POA: Diagnosis not present

## 2020-06-20 DIAGNOSIS — K219 Gastro-esophageal reflux disease without esophagitis: Secondary | ICD-10-CM | POA: Diagnosis present

## 2020-06-20 DIAGNOSIS — Z794 Long term (current) use of insulin: Secondary | ICD-10-CM | POA: Diagnosis not present

## 2020-06-20 DIAGNOSIS — N184 Chronic kidney disease, stage 4 (severe): Secondary | ICD-10-CM | POA: Diagnosis present

## 2020-06-20 DIAGNOSIS — I5042 Chronic combined systolic (congestive) and diastolic (congestive) heart failure: Secondary | ICD-10-CM | POA: Diagnosis present

## 2020-06-20 DIAGNOSIS — E860 Dehydration: Secondary | ICD-10-CM | POA: Diagnosis present

## 2020-06-20 DIAGNOSIS — K766 Portal hypertension: Secondary | ICD-10-CM | POA: Diagnosis present

## 2020-06-20 DIAGNOSIS — Z20822 Contact with and (suspected) exposure to covid-19: Secondary | ICD-10-CM | POA: Diagnosis present

## 2020-06-20 DIAGNOSIS — E039 Hypothyroidism, unspecified: Secondary | ICD-10-CM | POA: Diagnosis present

## 2020-06-20 DIAGNOSIS — G4733 Obstructive sleep apnea (adult) (pediatric): Secondary | ICD-10-CM | POA: Diagnosis present

## 2020-06-20 DIAGNOSIS — I2510 Atherosclerotic heart disease of native coronary artery without angina pectoris: Secondary | ICD-10-CM | POA: Diagnosis not present

## 2020-06-20 DIAGNOSIS — K449 Diaphragmatic hernia without obstruction or gangrene: Secondary | ICD-10-CM | POA: Diagnosis not present

## 2020-06-20 DIAGNOSIS — I428 Other cardiomyopathies: Secondary | ICD-10-CM

## 2020-06-20 DIAGNOSIS — Z79899 Other long term (current) drug therapy: Secondary | ICD-10-CM | POA: Diagnosis not present

## 2020-06-20 DIAGNOSIS — I700 Atherosclerosis of aorta: Secondary | ICD-10-CM | POA: Diagnosis not present

## 2020-06-20 DIAGNOSIS — Z888 Allergy status to other drugs, medicaments and biological substances status: Secondary | ICD-10-CM | POA: Diagnosis not present

## 2020-06-20 DIAGNOSIS — E876 Hypokalemia: Secondary | ICD-10-CM | POA: Diagnosis present

## 2020-06-20 DIAGNOSIS — I130 Hypertensive heart and chronic kidney disease with heart failure and stage 1 through stage 4 chronic kidney disease, or unspecified chronic kidney disease: Secondary | ICD-10-CM | POA: Diagnosis present

## 2020-06-20 DIAGNOSIS — Z881 Allergy status to other antibiotic agents status: Secondary | ICD-10-CM | POA: Diagnosis not present

## 2020-06-20 LAB — CBC WITH DIFFERENTIAL/PLATELET
Abs Immature Granulocytes: 0.01 10*3/uL (ref 0.00–0.07)
Basophils Absolute: 0.1 10*3/uL (ref 0.0–0.1)
Basophils Relative: 1 %
Eosinophils Absolute: 0.2 10*3/uL (ref 0.0–0.5)
Eosinophils Relative: 4 %
HCT: 32.3 % — ABNORMAL LOW (ref 36.0–46.0)
Hemoglobin: 10.3 g/dL — ABNORMAL LOW (ref 12.0–15.0)
Immature Granulocytes: 0 %
Lymphocytes Relative: 37 %
Lymphs Abs: 2 10*3/uL (ref 0.7–4.0)
MCH: 27 pg (ref 26.0–34.0)
MCHC: 31.9 g/dL (ref 30.0–36.0)
MCV: 84.6 fL (ref 80.0–100.0)
Monocytes Absolute: 0.6 10*3/uL (ref 0.1–1.0)
Monocytes Relative: 12 %
Neutro Abs: 2.5 10*3/uL (ref 1.7–7.7)
Neutrophils Relative %: 46 %
Platelets: 147 10*3/uL — ABNORMAL LOW (ref 150–400)
RBC: 3.82 MIL/uL — ABNORMAL LOW (ref 3.87–5.11)
RDW: 15.6 % — ABNORMAL HIGH (ref 11.5–15.5)
WBC: 5.4 10*3/uL (ref 4.0–10.5)
nRBC: 0 % (ref 0.0–0.2)

## 2020-06-20 LAB — CBC
HCT: 35.2 % — ABNORMAL LOW (ref 36.0–46.0)
Hemoglobin: 11 g/dL — ABNORMAL LOW (ref 12.0–15.0)
MCH: 27.2 pg (ref 26.0–34.0)
MCHC: 31.3 g/dL (ref 30.0–36.0)
MCV: 86.9 fL (ref 80.0–100.0)
Platelets: 150 10*3/uL (ref 150–400)
RBC: 4.05 MIL/uL (ref 3.87–5.11)
RDW: 15.9 % — ABNORMAL HIGH (ref 11.5–15.5)
WBC: 5.5 10*3/uL (ref 4.0–10.5)
nRBC: 0 % (ref 0.0–0.2)

## 2020-06-20 LAB — HEPATIC FUNCTION PANEL
ALT: 14 U/L (ref 0–44)
AST: 16 U/L (ref 15–41)
Albumin: 3.2 g/dL — ABNORMAL LOW (ref 3.5–5.0)
Alkaline Phosphatase: 33 U/L — ABNORMAL LOW (ref 38–126)
Bilirubin, Direct: <0.1 mg/dL (ref 0.0–0.2)
Total Bilirubin: 0.6 mg/dL (ref 0.3–1.2)
Total Protein: 6.5 g/dL (ref 6.5–8.1)

## 2020-06-20 LAB — BASIC METABOLIC PANEL
Anion gap: 14 (ref 5–15)
BUN: 74 mg/dL — ABNORMAL HIGH (ref 8–23)
CO2: 26 mmol/L (ref 22–32)
Calcium: 11.3 mg/dL — ABNORMAL HIGH (ref 8.9–10.3)
Chloride: 100 mmol/L (ref 98–111)
Creatinine, Ser: 4.35 mg/dL — ABNORMAL HIGH (ref 0.44–1.00)
GFR, Estimated: 10 mL/min — ABNORMAL LOW (ref >60)
Glucose, Bld: 101 mg/dL — ABNORMAL HIGH (ref 70–99)
Potassium: 3.4 mmol/L — ABNORMAL LOW (ref 3.5–5.1)
Sodium: 140 mmol/L (ref 135–145)

## 2020-06-20 LAB — CK: Total CK: 85 U/L (ref 38–234)

## 2020-06-20 LAB — HEMOGLOBIN A1C
Hgb A1c MFr Bld: 6.1 % — ABNORMAL HIGH (ref 4.8–5.6)
Mean Plasma Glucose: 128.37 mg/dL

## 2020-06-20 LAB — GLUCOSE, CAPILLARY
Glucose-Capillary: 71 mg/dL (ref 70–99)
Glucose-Capillary: 90 mg/dL (ref 70–99)
Glucose-Capillary: 121 mg/dL — ABNORMAL HIGH (ref 70–99)

## 2020-06-20 LAB — SARS CORONAVIRUS 2 (TAT 6-24 HRS): SARS Coronavirus 2: NEGATIVE

## 2020-06-20 LAB — CREATININE, SERUM
Creatinine, Ser: 4.47 mg/dL — ABNORMAL HIGH (ref 0.44–1.00)
GFR, Estimated: 10 mL/min — ABNORMAL LOW (ref >60)

## 2020-06-20 LAB — PHOSPHORUS: Phosphorus: 3.9 mg/dL (ref 2.5–4.6)

## 2020-06-20 MED ORDER — SODIUM CHLORIDE 0.9 % IV SOLN: INTRAVENOUS | Status: AC

## 2020-06-20 MED ORDER — HYDROXYZINE HCL 25 MG PO TABS
50.0000 mg | ORAL_TABLET | Freq: Two times a day (BID) | ORAL | Status: DC
  Administered 2020-06-20 – 2020-06-24 (×9): 50 mg via ORAL
  Filled 2020-06-20 (×9): qty 2

## 2020-06-20 MED ORDER — HEPARIN SODIUM (PORCINE) 5000 UNIT/ML IJ SOLN
5000.0000 [IU] | Freq: Three times a day (TID) | INTRAMUSCULAR | Status: DC
  Administered 2020-06-20 – 2020-06-24 (×14): 5000 [IU] via SUBCUTANEOUS
  Filled 2020-06-20 (×13): qty 1

## 2020-06-20 MED ORDER — HYDRALAZINE HCL 20 MG/ML IJ SOLN: 10.0000 mg | INTRAMUSCULAR | Status: DC | PRN

## 2020-06-20 MED ORDER — SILDENAFIL CITRATE 20 MG PO TABS
60.0000 mg | ORAL_TABLET | Freq: Three times a day (TID) | ORAL | Status: DC
  Administered 2020-06-20 – 2020-06-24 (×14): 60 mg via ORAL
  Filled 2020-06-20 (×16): qty 3

## 2020-06-20 MED ORDER — PREGABALIN 25 MG PO CAPS: 50.0000 mg | ORAL_CAPSULE | Freq: Two times a day (BID) | ORAL | Status: DC

## 2020-06-20 MED ORDER — PANTOPRAZOLE SODIUM 40 MG PO TBEC
40.0000 mg | DELAYED_RELEASE_TABLET | Freq: Two times a day (BID) | ORAL | Status: DC
Start: 2020-06-20 — End: 2020-06-24
  Administered 2020-06-20 – 2020-06-24 (×9): 40 mg via ORAL
  Filled 2020-06-20 (×9): qty 1

## 2020-06-20 MED ORDER — DIAZEPAM 5 MG PO TABS
5.0000 mg | ORAL_TABLET | Freq: Two times a day (BID) | ORAL | Status: DC | PRN
  Administered 2020-06-20 – 2020-06-22 (×2): 5 mg via ORAL
  Filled 2020-06-20 (×2): qty 1

## 2020-06-20 MED ORDER — PREGABALIN 75 MG PO CAPS
150.0000 mg | ORAL_CAPSULE | Freq: Every day | ORAL | Status: DC
  Administered 2020-06-20 – 2020-06-23 (×4): 150 mg via ORAL
  Filled 2020-06-20 (×4): qty 2

## 2020-06-20 MED ORDER — DICLOFENAC SODIUM 1 % EX GEL
2.0000 g | Freq: Four times a day (QID) | CUTANEOUS | Status: DC
  Administered 2020-06-20 – 2020-06-24 (×17): 2 g via TOPICAL
  Filled 2020-06-20 (×2): qty 100

## 2020-06-20 MED ORDER — INSULIN ASPART 100 UNIT/ML ~~LOC~~ SOLN
0.0000 [IU] | Freq: Three times a day (TID) | SUBCUTANEOUS | Status: DC
  Administered 2020-06-21 – 2020-06-24 (×2): 1 [IU] via SUBCUTANEOUS

## 2020-06-20 MED ORDER — CARVEDILOL 6.25 MG PO TABS
6.2500 mg | ORAL_TABLET | Freq: Two times a day (BID) | ORAL | Status: DC
  Administered 2020-06-20 – 2020-06-24 (×10): 6.25 mg via ORAL
  Filled 2020-06-20 (×10): qty 1

## 2020-06-20 MED ORDER — PRAVASTATIN SODIUM 40 MG PO TABS
80.0000 mg | ORAL_TABLET | Freq: Every day | ORAL | Status: DC
  Administered 2020-06-20 – 2020-06-24 (×5): 80 mg via ORAL
  Filled 2020-06-20 (×5): qty 2

## 2020-06-20 MED ORDER — ASPIRIN EC 81 MG PO TBEC
81.0000 mg | DELAYED_RELEASE_TABLET | Freq: Every day | ORAL | Status: DC
  Administered 2020-06-20 – 2020-06-24 (×5): 81 mg via ORAL
  Filled 2020-06-20 (×5): qty 1

## 2020-06-20 MED ORDER — ACETAMINOPHEN 325 MG PO TABS
650.0000 mg | ORAL_TABLET | Freq: Four times a day (QID) | ORAL | Status: DC | PRN
  Administered 2020-06-20: 650 mg via ORAL
  Filled 2020-06-20: qty 2

## 2020-06-20 MED ORDER — POTASSIUM CHLORIDE CRYS ER 20 MEQ PO TBCR
40.0000 meq | EXTENDED_RELEASE_TABLET | Freq: Once | ORAL | Status: AC
  Administered 2020-06-20: 40 meq via ORAL
  Filled 2020-06-20: qty 2

## 2020-06-20 MED ORDER — PREGABALIN 75 MG PO CAPS
75.0000 mg | ORAL_CAPSULE | Freq: Every day | ORAL | Status: DC
  Administered 2020-06-20 – 2020-06-24 (×5): 75 mg via ORAL
  Filled 2020-06-20 (×5): qty 1

## 2020-06-20 MED ORDER — CLOPIDOGREL BISULFATE 75 MG PO TABS
75.0000 mg | ORAL_TABLET | Freq: Every day | ORAL | Status: DC
  Administered 2020-06-20 – 2020-06-24 (×5): 75 mg via ORAL
  Filled 2020-06-20 (×5): qty 1

## 2020-06-20 MED ORDER — ACETAMINOPHEN 650 MG RE SUPP: 650.0000 mg | Freq: Four times a day (QID) | RECTAL | Status: DC | PRN

## 2020-06-20 MED ORDER — LEVOTHYROXINE SODIUM 100 MCG PO TABS
100.0000 ug | ORAL_TABLET | ORAL | Status: DC
  Administered 2020-06-20 – 2020-06-24 (×4): 100 ug via ORAL
  Filled 2020-06-20 (×5): qty 1

## 2020-06-20 MED ORDER — ALLOPURINOL 100 MG PO TABS
50.0000 mg | ORAL_TABLET | Freq: Every day | ORAL | Status: DC
  Administered 2020-06-20 – 2020-06-24 (×5): 50 mg via ORAL
  Filled 2020-06-20 (×5): qty 1
  Filled 2020-06-20: qty 0.5

## 2020-06-20 MED ORDER — HYDROCODONE-ACETAMINOPHEN 10-325 MG PO TABS
1.0000 | ORAL_TABLET | Freq: Four times a day (QID) | ORAL | Status: DC | PRN
Start: 2020-06-20 — End: 2020-06-24
  Administered 2020-06-20 – 2020-06-24 (×13): 1 via ORAL
  Filled 2020-06-20 (×14): qty 1

## 2020-06-20 MED ORDER — AMLODIPINE BESYLATE 10 MG PO TABS: 10.0000 mg | ORAL_TABLET | Freq: Every day | ORAL | Status: DC

## 2020-06-20 MED ORDER — IVABRADINE HCL 7.5 MG PO TABS
7.5000 mg | ORAL_TABLET | Freq: Two times a day (BID) | ORAL | Status: DC
Start: 2020-06-20 — End: 2020-06-24
  Administered 2020-06-20 – 2020-06-24 (×9): 7.5 mg via ORAL
  Filled 2020-06-20 (×13): qty 1
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
## 2020-06-20 NOTE — Progress Notes (Signed)
Late Entry:  New Admission Note:  Arrival Method: Via stretcher from ED to 49m01 Mental Orientation: Alert & Oriented x4 Telemetry: CCMD verified. Box-04 Assessment: Completed Skin: Refer to flowsheet IV: Left Hand Pain: 0/10 Tubes: Safety Measures: Safety Fall Prevention Plan discussed with patient. Admission: Completed 5 [place] Orientation: Patient has been orientated to the room, unit and the staff.  Orders have been reviewed and are being implemented. Will continue to monitor the patient. Call light has been placed within reach and bed alarm has been activated.   Vassie Moselle, RN  Phone Number: [phone]

## 2020-06-20 NOTE — Plan of Care (Signed)
  Problem: Education: Goal: Knowledge of General Education information will improve Description Including pain rating scale, medication(s)/side effects and non-pharmacologic comfort measures Outcome: Progressing   

## 2020-06-20 NOTE — ED Notes (Signed)
Patient transported to Ultrasound 

## 2020-06-20 NOTE — Progress Notes (Signed)
PROGRESS NOTE  Grace Bradford  DOB: 11-13-1945  PCP: Jani Gravel, MD ZOX:[id number]  DOA: 06/19/2020  LOS: 0 days   Chief Complaint  Patient presents with  . Fatigue   Brief narrative: Grace Bradford is a 75 y.o. female with PMH significant for DM2, HTN, chronic combined systolic and diastolic CHF (EF 35 to 81% 3/21), pulmonary hypertension, nonobstructive CAD, CKD 4, chronic anemia, hypothyroidism.  Lives at home with her husband, ambulates with a walker. 2/9, patient was seen by cardiologist, Wilder Glade was added for CHF.  2/23, seen by nephrologist, blood work showed creatinine elevated to 2.2 and calcium elevated to >11 and hence directed to ED.  In the ED, patient looked dehydrated but was hemodynamically stable. Labs showed creatinine elevated to 4.5, calcium elevated to 11.7 Patient was started on normal saline and was admitted to hospitalist service.  Subjective: Patient was seen and examined this morning.  Pleasant elderly African-American female.  Lying down in bed.  Not in distress.  Not on supplemental oxygen.  Not hypervolemic at this time. Chart reviewed.  Remains hemodynamically stable. Labs from this morning with potassium low at 3.4, creatinine slightly improved at 4.35.  Assessment/Plan: AKI on CKD 4 -Patient is in a CHF regimen as stated below which includes Entresto, torsemide and metolazone.  On top of that Wilder Glade was recently added.  Patient denies taking any NSAIDs. -Dehydration and AKI was probably was secondary to addition of Farxiga on top of diuretics. -Baseline creatinine 1.36 from a year ago.  Presented with a creatinine of 4.53.  Currently on IV fluid.  Continue to monitor. -Renal ultrasound pending. -Follows up outpatient with Dr. Hollie Salk. Recent Labs    07/11/19 1502 06/04/20 1530 06/19/20 1744 06/20/20 0512 06/20/20 0636  BUN 28* 51* 80*  --  74*  CREATININE 1.36* 2.94* 4.53* 4.47* 4.35*   Chronic combined systolic and diastolic CHF Essential  hypertension Pulmonary hypertension -Last echo from 3/21 with EF 35 to 40%. -Home meds include Coreg 6.25 mg twice daily, Entresto 97/103 mg twice daily, Farxiga 10 mg daily, torsemide 50 mg daily, metolazone 2.5 mg daily as needed, amlodipine 10 mg daily, sildenafil 60 mg 3 times daily, Corlanor 7.5 mg twice daily -Currently on IV fluid because of AKI. -Currently on hold are Delene Loll, Farxiga, torsemide and metolazone. -I will keep amlodipine on hold to make room for other CHF regimen -Heart failure consultation called. -Monitor electrolytes Recent Labs  Lab 06/19/20 1744 06/20/20 0636  K 4.3 3.4*  PHOS  --  3.9   Hypokalemia -Potassium level low at 3.4 this morning.  40 mEq once given.  Hypercalcemia -Calcium level was elevated to 11.7 on presentation probably due to dehydration. -Continue monitor on hydration.  Hold off on calcium supplement at this time. Recent Labs  Lab 06/19/20 1744 06/20/20 0636  CALCIUM 11.7* 11.3*   Type 2 diabetes mellitus -A1c 7.5 from 2019.  Repeat A1c. -Home meds include Levemir 15 units daily, Trulicity 1.5 mg subcu weekly on Thursday and recently added on Farxiga 10 mg daily primary for heart failure purpose. -Currently only on sliding scale insulin with Accu-Cheks. Lab Results  Component Value Date   HGBA1C 7.5 (H) 07/18/2017   Recent Labs  Lab 06/20/20 0645  GLUCAP 71   History of CAD nonobstructive -Continue aspirin, Plavix and statin.  Chronic anemia -Stable hemoglobin -Continue PPI Recent Labs    07/11/19 1502 06/19/20 1744 06/20/20 0512 06/20/20 0636  HGB 10.5* 10.2* 11.0* 10.3*  MCV 89.7 86.0 86.9  84.6   Hypothyroidism -Continue Synthroid  History of sleep apnea  -on dental appliances.  Other medicines include Skelaxin 3 times daily, Valium as needed, Lyrica twice daily.  Mobility: PT eval ordered Code Status:   Code Status: Full Code  Nutritional status: There is no height or weight on file to calculate BMI.      Diet Order            Diet heart healthy/carb modified Room service appropriate? Yes; Fluid consistency: Thin; Fluid restriction: 1200 mL Fluid  Diet effective now                 DVT prophylaxis: heparin injection 5,000 Units Start: 06/20/20 0600   Antimicrobials:  None Fluid: Normal saline at 75 mill per hour Consultants: CHF Family Communication:  None at bedside  Status is: Observation  The patient will require care spanning > 2 midnights and should be moved to inpatient because: Needs IV fluid, high flow medicine optimization  Dispo: The patient is from: Home              Anticipated d/c is to: Home              Patient currently is not medically stable to d/c.   Difficult to place patient No       Infusions:  . sodium chloride 75 mL/hr at 06/20/20 0546    Scheduled Meds: . allopurinol  50 mg Oral Daily  . aspirin EC  81 mg Oral Daily  . carvedilol  6.25 mg Oral BID WC  . clopidogrel  75 mg Oral Daily  . heparin  5,000 Units Subcutaneous Q8H  . hydrOXYzine  50 mg Oral BID  . insulin aspart  0-9 Units Subcutaneous TID WC  . ivabradine  7.5 mg Oral BID WC  . levothyroxine  100 mcg Oral Once per day on Mon Tue Wed Thu Fri  . pantoprazole  40 mg Oral BID  . potassium chloride  40 mEq Oral Once  . pravastatin  80 mg Oral q1800  . pregabalin  150 mg Oral QHS  . pregabalin  75 mg Oral Daily  . sildenafil  60 mg Oral TID    Antimicrobials: Anti-infectives (From admission, onward)   None      PRN meds: acetaminophen **OR** acetaminophen, diazepam, hydrALAZINE   Objective: Vitals:   06/20/20 0527 06/20/20 0845  BP: (!) 158/76 (!) 154/58  Pulse: 67 67  Resp:  18  Temp: 97.7 F (36.5 C) 97.8 F (36.6 C)  SpO2: 100% 91%    Intake/Output Summary (Last 24 hours) at 06/20/2020 1607 Last data filed at 06/20/2020 0601 Gross per 24 hour  Intake 517.19 ml  Output -  Net 517.19 ml   There were no vitals filed for this visit. Weight change:  There  is no height or weight on file to calculate BMI.   Physical Exam: General exam: Pleasant elderly African-American female.  Not in physical distress Skin: No rashes, lesions or ulcers. HEENT: Atraumatic, normocephalic, no obvious bleeding Lungs: Clear to auscultation bilaterally CVS: Regular rate and rhythm, no murmur GI/Abd soft, nontender, nondistended, bowel sound present CNS: Alert, awake, oriented x3 Psychiatry: Mood appropriate Extremities: No pedal edema, no calf tenderness  Data Review: I have personally reviewed the laboratory data and studies available.  Recent Labs  Lab 06/19/20 1744 06/20/20 0512 06/20/20 0636  WBC 4.6 5.5 5.4  NEUTROABS  --   --  2.5  HGB 10.2* 11.0* 10.3*  HCT 32.0*  35.2* 32.3*  MCV 86.0 86.9 84.6  PLT 153 150 147*   Recent Labs  Lab 06/19/20 1744 06/20/20 0512 06/20/20 0636  NA 138  --  140  K 4.3  --  3.4*  CL 101  --  100  CO2 26  --  26  GLUCOSE 120*  --  101*  BUN 80*  --  74*  CREATININE 4.53* 4.47* 4.35*  CALCIUM 11.7*  --  11.3*  PHOS  --   --  3.9    F/u labs ordered Unresulted Labs (From admission, onward)          Start     Ordered   06/21/20 5038  Basic metabolic panel  Daily,   R      06/20/20 0948   06/21/20 0500  CBC with Differential/Platelet  Daily,   R      06/20/20 0948   06/21/20 0500  Magnesium  Tomorrow morning,   STAT        06/20/20 0948   06/21/20 0500  Phosphorus  Tomorrow morning,   R        06/20/20 0948   06/20/20 0842  Hemoglobin A1c  Add-on,   AD        06/20/20 0841   06/20/20 0011  Urinalysis, Routine w reflex microscopic Nasopharyngeal Swab  ONCE - STAT,   STAT        06/20/20 0010          Signed, Terrilee Croak, MD [facility] 06/20/2020
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
## 2020-06-20 NOTE — Evaluation (Signed)
Physical Therapy Evaluation Patient Details Name: Grace Bradford MRN: [id number] DOB: 21-Feb-1946 Today's Date: 06/20/2020   History of Present Illness  Pt is a 75 y/o female who was sent to ED by her nephrologist for worsening kidney function. In ED, diagnosed with acute renal failure. PMH signifcant for DM, CAD, HTN, CKD, combined chronic HF, prior CHF, cardiomyopathy, and OSA.    Clinical Impression  Pt received in bed, cooperative and pleasant. Generally min A for mobility, cueing to progress trunk forward during scooting forward and sit to stand as well as to maintain RW at appropriate distance during ambulation. Gait distance limited by knee/back pain. Follows commands consistently with increased time. Pt lives with husband, available 24/7, and has a daughter nearby. Adequate DME at home. Recommend home health PT to improve strength, endurance, and balance. Pt left in chair with all needs met, call bell within reach, and chair alarm active. Will need to complete stairs before returning home.    Follow Up Recommendations Home health PT;Supervision for mobility/OOB    Equipment Recommendations  None recommended by PT    Recommendations for Other Services       Precautions / Restrictions Precautions Precautions: Fall Restrictions Weight Bearing Restrictions: No      Mobility  Bed Mobility Overal bed mobility: Needs Assistance Bed Mobility: Supine to Sit     Supine to sit: Min assist     General bed mobility comments: Min A for scooting forward to EOB    Transfers Overall transfer level: Needs assistance Equipment used: Rolling walker (2 wheeled) Transfers: Sit to/from Stand Sit to Stand: Min assist         General transfer comment: Min A for power up into standing and for steadying upon standing  Ambulation/Gait Ambulation/Gait assistance: Herbalist (Feet): 12 Feet Assistive device: Rolling walker (2 wheeled) Gait Pattern/deviations: Step-to  pattern;Trunk flexed;Decreased stride length Gait velocity: Decreased   General Gait Details: Reported knee pain and "sciatica" pain upon standing, which limited gait distance  Stairs            Wheelchair Mobility    Modified Rankin (Stroke Patients Only)       Balance Overall balance assessment: Needs assistance   Sitting balance-Leahy Scale: Fair       Standing balance-Leahy Scale: Poor Standing balance comment: Reliant on B UE support                             Pertinent Vitals/Pain Pain Assessment: Faces Faces Pain Scale: Hurts even more Pain Location: Generalized and R LE Pain Descriptors / Indicators: Constant;Discomfort;Aching Pain Intervention(s): Monitored during session;Repositioned    Home Living Family/patient expects to be discharged to:: Private residence Living Arrangements: Spouse/significant other Available Help at Discharge: Family;Available 24 hours/day Type of Home: House Home Access: Stairs to enter Entrance Stairs-Rails: Chemical engineer of Steps: 4 Home Layout: Two level Home Equipment: Environmental consultant - 2 wheels;Walker - 4 wheels;Bedside commode;Tub bench      Prior Function Level of Independence: Independent with assistive device(s)         Comments: Normally uses rollator, husband available 24/7, has 5 kids (1 lives in nearby [place])     Hand Dominance   Dominant Hand: Right    Extremity/Trunk Assessment   Upper Extremity Assessment Upper Extremity Assessment: Overall WFL for tasks assessed    Lower Extremity Assessment Lower Extremity Assessment: Generalized weakness    Cervical / Trunk Assessment Cervical /  Trunk Assessment: Kyphotic  Communication   Communication: No difficulties  Cognition Arousal/Alertness: Awake/alert Behavior During Therapy: WFL for tasks assessed/performed Overall Cognitive Status: Within Functional Limits for tasks assessed                                  General Comments: A&Ox4. Appropriate responses, laughs appropriately at jokes      General Comments General comments (skin integrity, edema, etc.): Reported multiple falls at home, most occuring on stairs    Exercises     Assessment/Plan    PT Assessment Patient needs continued PT services  PT Problem List Decreased strength;Decreased mobility;Decreased coordination;Decreased safety awareness;Decreased activity tolerance;Decreased balance;Decreased knowledge of use of DME;Pain       PT Treatment Interventions DME instruction;Gait training;Balance training;Therapeutic exercise;Stair training;Functional mobility training;Therapeutic activities;Patient/family education;Cognitive remediation;Neuromuscular re-education    PT Goals (Current goals can be found in the Care Plan section)  Acute Rehab PT Goals Patient Stated Goal: walk better, easier time standing PT Goal Formulation: With patient Time For Goal Achievement: 07/04/20 Potential to Achieve Goals: Good    Frequency Min 3X/week   Barriers to discharge        Co-evaluation               AM-PAC PT "6 Clicks" Mobility  Outcome Measure Help needed turning from your back to your side while in a flat bed without using bedrails?: A Little Help needed moving from lying on your back to sitting on the side of a flat bed without using bedrails?: A Little Help needed moving to and from a bed to a chair (including a wheelchair)?: A Little Help needed standing up from a chair using your arms (e.g., wheelchair or bedside chair)?: A Little Help needed to walk in hospital room?: A Little Help needed climbing 3-5 steps with a railing? : A Lot 6 Click Score: 17    End of Session Equipment Utilized During Treatment: Gait belt Activity Tolerance: Patient limited by pain;Patient tolerated treatment well Patient left: in chair;with chair alarm set;with call bell/phone within reach Nurse Communication: Mobility status PT  Visit Diagnosis: Unsteadiness on feet (R26.81);History of falling (Z91.81);Pain;Muscle weakness (generalized) (M62.81)    Time:  -      Charges:              Rosita Kea, SPT

## 2020-06-21 LAB — CBC WITH DIFFERENTIAL/PLATELET
Abs Immature Granulocytes: 0.01 10*3/uL (ref 0.00–0.07)
Basophils Absolute: 0 10*3/uL (ref 0.0–0.1)
Basophils Relative: 1 %
Eosinophils Absolute: 0.2 10*3/uL (ref 0.0–0.5)
Eosinophils Relative: 5 %
HCT: 28.8 % — ABNORMAL LOW (ref 36.0–46.0)
Hemoglobin: 9.1 g/dL — ABNORMAL LOW (ref 12.0–15.0)
Immature Granulocytes: 0 %
Lymphocytes Relative: 45 %
Lymphs Abs: 2.1 10*3/uL (ref 0.7–4.0)
MCH: 26.5 pg (ref 26.0–34.0)
MCHC: 31.6 g/dL (ref 30.0–36.0)
MCV: 84 fL (ref 80.0–100.0)
Monocytes Absolute: 0.5 10*3/uL (ref 0.1–1.0)
Monocytes Relative: 12 %
Neutro Abs: 1.7 10*3/uL (ref 1.7–7.7)
Neutrophils Relative %: 37 %
Platelets: 140 10*3/uL — ABNORMAL LOW (ref 150–400)
RBC: 3.43 MIL/uL — ABNORMAL LOW (ref 3.87–5.11)
RDW: 15.9 % — ABNORMAL HIGH (ref 11.5–15.5)
WBC: 4.5 10*3/uL (ref 4.0–10.5)
nRBC: 0 % (ref 0.0–0.2)

## 2020-06-21 LAB — GLUCOSE, CAPILLARY
Glucose-Capillary: 76 mg/dL (ref 70–99)
Glucose-Capillary: 98 mg/dL (ref 70–99)
Glucose-Capillary: 125 mg/dL — ABNORMAL HIGH (ref 70–99)
Glucose-Capillary: 125 mg/dL — ABNORMAL HIGH (ref 70–99)

## 2020-06-21 LAB — PHOSPHORUS: Phosphorus: 3 mg/dL (ref 2.5–4.6)

## 2020-06-21 LAB — BASIC METABOLIC PANEL
Anion gap: 10 (ref 5–15)
BUN: 71 mg/dL — ABNORMAL HIGH (ref 8–23)
CO2: 23 mmol/L (ref 22–32)
Calcium: 10.2 mg/dL (ref 8.9–10.3)
Chloride: 105 mmol/L (ref 98–111)
Creatinine, Ser: 3.96 mg/dL — ABNORMAL HIGH (ref 0.44–1.00)
GFR, Estimated: 11 mL/min — ABNORMAL LOW (ref >60)
Glucose, Bld: 127 mg/dL — ABNORMAL HIGH (ref 70–99)
Potassium: 3.8 mmol/L (ref 3.5–5.1)
Sodium: 138 mmol/L (ref 135–145)

## 2020-06-21 LAB — MAGNESIUM: Magnesium: 1.3 mg/dL — ABNORMAL LOW (ref 1.7–2.4)

## 2020-06-21 MED ORDER — MAGNESIUM SULFATE 2 GM/50ML IV SOLN
2.0000 g | Freq: Once | INTRAVENOUS | Status: AC
  Administered 2020-06-21: 2 g via INTRAVENOUS
  Filled 2020-06-21: qty 50

## 2020-06-21 MED ORDER — FLUTICASONE PROPIONATE 50 MCG/ACT NA SUSP
2.0000 | Freq: Every day | NASAL | Status: DC
  Administered 2020-06-22 – 2020-06-24 (×4): 2 via NASAL
  Filled 2020-06-21: qty 16

## 2020-06-21 MED ORDER — GUAIFENESIN-DM 100-10 MG/5ML PO SYRP
10.0000 mL | ORAL_SOLUTION | Freq: Four times a day (QID) | ORAL | Status: DC | PRN
Start: 2020-06-21 — End: 2020-06-24
  Administered 2020-06-21: 10 mL via ORAL
  Filled 2020-06-21: qty 10

## 2020-06-21 NOTE — Progress Notes (Signed)
Physical Therapy Treatment Patient Details Name: Grace Bradford MRN: [id number] DOB: 1946-02-01 Today's Date: 06/21/2020    History of Present Illness Pt is a 75 y/o female who was sent to ED by her nephrologist for worsening kidney function. In ED, diagnosed with acute renal failure. PMH signifcant for DM, CAD, HTN, CKD, combined chronic HF, prior CHF, cardiomyopathy, and OSA.    PT Comments    Patient received in bed, agrees to PT session. Reports she needs to use bathroom first. Encouraged her to ambulate into bathroom. Performed bed mobility with mod independence, Transfers with min guard. Increased time and effort. Patient ambulates with slow cadence, forward flexed posture with heavy reliance on RW. Mod independence with adls in bathroom. 20 feet into and out of bathroom, then ambulated another 25 feet out in hallway. Patient limited in distance due to sciatica pain and weakness in LEs. Exercises not performed this session as lunch tray was brought into room. Patient will continue to benefit from skilled PT while here to improve functional independence, strength and safety with mobility.     Follow Up Recommendations  Home health PT;Supervision for mobility/OOB     Equipment Recommendations  None recommended by PT    Recommendations for Other Services       Precautions / Restrictions Precautions Precaution Comments: mod fall Restrictions Weight Bearing Restrictions: No    Mobility  Bed Mobility Overal bed mobility: Modified Independent Bed Mobility: Supine to Sit     Supine to sit: Modified independent (Device/Increase time)     General bed mobility comments: lefts sitting up on side of bed to eat lunch    Transfers Overall transfer level: Modified independent Equipment used: Rolling walker (2 wheeled) Transfers: Sit to/from Stand Sit to Stand: Modified independent (Device/Increase time)         General transfer comment: patient able to stand from bed and bsc  without assist, increased time and effort.  Ambulation/Gait Ambulation/Gait assistance: Min guard Gait Distance (Feet): 45 Feet Assistive device: Rolling walker (2 wheeled) Gait Pattern/deviations: Step-to pattern;Trunk flexed;Decreased step length - right;Decreased step length - left Gait velocity: Decreased   General Gait Details: reports sciatica for last 2 years. Flexed posture with standing and walking. Heavy reliance on UE support.   Stairs             Wheelchair Mobility    Modified Rankin (Stroke Patients Only)       Balance Overall balance assessment: Modified Independent Sitting-balance support: Feet supported Sitting balance-Leahy Scale: Good     Standing balance support: Bilateral upper extremity supported;During functional activity Standing balance-Leahy Scale: Fair Standing balance comment: Reliant on B UE support                            Cognition Arousal/Alertness: Awake/alert Behavior During Therapy: WFL for tasks assessed/performed Overall Cognitive Status: Within Functional Limits for tasks assessed                                 General Comments: A&Ox4. Appropriate responses, laughs appropriately at jokes      Exercises      General Comments        Pertinent Vitals/Pain Pain Assessment: Faces Faces Pain Scale: Hurts a little bit Pain Location: back and legs Pain Descriptors / Indicators: Aching;Discomfort Pain Intervention(s): Monitored during session    Home Living  Prior Function            PT Goals (current goals can now be found in the care plan section) Acute Rehab PT Goals Patient Stated Goal: walk better, easier time standing PT Goal Formulation: With patient Time For Goal Achievement: 07/04/20 Potential to Achieve Goals: Good Progress towards PT goals: Progressing toward goals    Frequency    Min 3X/week      PT Plan Current plan remains appropriate     Co-evaluation              AM-PAC PT "6 Clicks" Mobility   Outcome Measure  Help needed turning from your back to your side while in a flat bed without using bedrails?: None Help needed moving from lying on your back to sitting on the side of a flat bed without using bedrails?: A Little Help needed moving to and from a bed to a chair (including a wheelchair)?: A Little Help needed standing up from a chair using your arms (e.g., wheelchair or bedside chair)?: A Little Help needed to walk in hospital room?: A Little Help needed climbing 3-5 steps with a railing? : A Lot 6 Click Score: 18    End of Session Equipment Utilized During Treatment: Gait belt Activity Tolerance: Patient tolerated treatment well Patient left: in bed;with call bell/phone within reach;with family/visitor present Nurse Communication: Mobility status PT Visit Diagnosis: Muscle weakness (generalized) (M62.81);Difficulty in walking, not elsewhere classified (R26.2);Pain;History of falling (Z91.81) Pain - part of body: Leg     Time: 1135-1151 PT Time Calculation (min) (ACUTE ONLY): 16 min  Charges:  $Gait Training: 8-22 mins                     [employer], PT, [employer] 06/21/20,12:03 PM

## 2020-06-21 NOTE — Plan of Care (Signed)

## 2020-06-21 NOTE — Progress Notes (Signed)
PROGRESS NOTE  Grace Bradford  DOB: 1945/07/08  PCP: Pearson Grippe, MD MVO:[id number]  DOA: 06/19/2020  LOS: 1 day   Chief Complaint  Patient presents with  . Fatigue   Brief narrative: Grace Bradford is a 74 y.o. female with PMH significant for DM2, HTN, chronic combined systolic and diastolic CHF (EF 35 to 40% 3/21), pulmonary hypertension, nonobstructive CAD, CKD 4, chronic anemia, hypothyroidism.  Lives at home with her husband, ambulates with a walker. 2/9, patient was seen by cardiologist, Marcelline Deist was added for CHF.  2/23, seen by nephrologist, blood work showed creatinine elevated to 2.2 and calcium elevated to >11 and hence directed to ED.  In the ED, patient looked dehydrated but was hemodynamically stable. Labs showed creatinine elevated to 4.5, calcium elevated to 11.7 Patient was started on normal saline and was admitted to hospitalist service.  Subjective: Patient was seen and examined this morning.   Complains of aggravation of chronic pain because of right sided sciatica.  Home meds resumed.  Left this morning with improvement in creatinine.  Assessment/Plan: AKI on CKD 4 -Patient is in a CHF regimen as stated below which includes Entresto, torsemide and metolazone.  On top of that Marcelline Deist was recently added.  Patient denies taking any NSAIDs. -Dehydration and AKI was probably was secondary to addition of Farxiga on top of diuretics. -Baseline creatinine 1.36 from a year ago.  Presented with a creatinine of 4.53.  Creatinine improved to 3.6 this morning.  Off IV fluid.  Continue to monitor. -Renal ultrasound unremarkable. -Follows up outpatient with Dr. Signe Colt. Recent Labs    07/11/19 1502 06/04/20 1530 06/19/20 1744 06/20/20 0512 06/20/20 0636 06/21/20 0238  BUN 28* 51* 80*  --  74* 71*  CREATININE 1.36* 2.94* 4.53* 4.47* 4.35* 3.96*   Chronic combined systolic and diastolic CHF Essential hypertension Pulmonary hypertension -Last echo from 3/21 with EF 35 to  40%. -Home meds include Coreg 6.25 mg twice daily, Entresto 97/103 mg twice daily, Farxiga 10 mg daily, torsemide 50 mg daily, metolazone 2.5 mg daily as needed, amlodipine 10 mg daily, sildenafil 60 mg 3 times daily, Corlanor 7.5 mg twice daily -Currently on hold are amlodipine, Entresto, Farxiga, torsemide and metolazone. -Continue Coreg, sildenafil and Corlanor. -Heart failure team following. -Monitor electrolytes Recent Labs  Lab 06/19/20 1744 06/20/20 0636 06/21/20 0238  K 4.3 3.4* 3.8  MG  --   --  1.3*  PHOS  --  3.9 3.0   Hypokalemia/hypomagnesemia -Potassium level improved.  Magnesium level low at 1.3.  Replacement ordered.  Hypercalcemia -Calcium level was elevated to 11.7 on presentation probably due to dehydration. -Improved now. -In order to rule out multiple myeloma as other possible etiology of AKI and hypercalcemia, bone scan was done which is normal.  SPEP, UPEP ordered by cardiology. Recent Labs  Lab 06/19/20 1744 06/20/20 0636 06/21/20 0238  CALCIUM 11.7* 11.3* 10.2   Type 2 diabetes mellitus -A1c 7.5 from 2019.  Repeat A1c. -Home meds include Levemir 15 units daily, Trulicity 1.5 mg subcu weekly on Thursday and recently added on Farxiga 10 mg daily primary for heart failure purpose. -Currently blood sugar level is controlled only on sliding scale insulin with Accu-Cheks.  As renal function improves, may need more insulin. Lab Results  Component Value Date   HGBA1C 6.1 (H) 06/20/2020   Recent Labs  Lab 06/20/20 0645 06/20/20 1129 06/20/20 1639 06/21/20 0642  GLUCAP 71 90 121* 76   History of CAD nonobstructive -Continue aspirin, Plavix and  statin.  Chronic anemia -Stable hemoglobin -Continue PPI Recent Labs    07/11/19 1502 06/19/20 1744 06/20/20 0512 06/20/20 0636 06/21/20 0238  HGB 10.5* 10.2* 11.0* 10.3* 9.1*  MCV 89.7 86.0 86.9 84.6 84.0   Hypothyroidism -Continue Synthroid  History of sleep apnea  -on dental  appliances.  Chronic right lower extremity pain  -She states it is related to sciatica. -Continue home medicine that include Skelaxin 3 times daily, Valium as needed, Lyrica twice daily and Voltaren gel..  Mobility: PT eval obtained.  Home with PT recommended Code Status:   Code Status: Full Code  Nutritional status: Body mass index is 34.58 kg/m.     Diet Order            Diet heart healthy/carb modified Room service appropriate? Yes; Fluid consistency: Thin; Fluid restriction: 1200 mL Fluid  Diet effective now                 DVT prophylaxis: heparin injection 5,000 Units Start: 06/20/20 0600   Antimicrobials:  None Fluid: None IV fluid Consultants: CHF Family Communication:  None at bedside  Status is: Inpatient  Remains inpatient appropriate because: Renal function is still not back to baseline, needs further optimization of heart failure medications   Dispo: The patient is from: Home              Anticipated d/c is to: Home with home health PT probably in 2 to 3 days              Patient currently is not medically stable to d/c.   Difficult to place patient No   Infusions:    Scheduled Meds: . allopurinol  50 mg Oral Daily  . aspirin EC  81 mg Oral Daily  . carvedilol  6.25 mg Oral BID WC  . clopidogrel  75 mg Oral Daily  . diclofenac Sodium  2 g Topical QID  . heparin  5,000 Units Subcutaneous Q8H  . hydrOXYzine  50 mg Oral BID  . insulin aspart  0-9 Units Subcutaneous TID WC  . ivabradine  7.5 mg Oral BID WC  . levothyroxine  100 mcg Oral Once per day on Mon Tue Wed Thu Fri  . pantoprazole  40 mg Oral BID  . pravastatin  80 mg Oral q1800  . pregabalin  150 mg Oral QHS  . pregabalin  75 mg Oral Daily  . sildenafil  60 mg Oral TID    Antimicrobials: Anti-infectives (From admission, onward)   None      PRN meds: acetaminophen **OR** acetaminophen, diazepam, hydrALAZINE, HYDROcodone-acetaminophen   Objective: Vitals:   06/21/20 0607 06/21/20  0917  BP: (!) 129/54 (!) 131/52  Pulse: 66 69  Resp: 18 17  Temp: 98.4 F (36.9 C) 97.9 F (36.6 C)  SpO2: 96% 98%    Intake/Output Summary (Last 24 hours) at 06/21/2020 1026 Last data filed at 06/21/2020 0817 Gross per 24 hour  Intake 2148.88 ml  Output 50 ml  Net 2098.88 ml   Filed Weights   06/20/20 1400  Weight: 83 kg   Weight change:  Body mass index is 34.58 kg/m.   Physical Exam: General exam: Pleasant elderly African-American female.  In mild distress because of sciatica Skin: No rashes, lesions or ulcers. HEENT: Atraumatic, normocephalic, no obvious bleeding Lungs: Clear to auscultation bilaterally CVS: Regular rate and rhythm, no murmur GI/Abd soft, nontender, nondistended, bowel sound present CNS: Alert, awake, oriented x3 Psychiatry: Mood appropriate Extremities: No pedal edema, no calf  tenderness  Data Review: I have personally reviewed the laboratory data and studies available.  Recent Labs  Lab 06/19/20 1744 06/20/20 0512 06/20/20 0636 06/21/20 0238  WBC 4.6 5.5 5.4 4.5  NEUTROABS  --   --  2.5 1.7  HGB 10.2* 11.0* 10.3* 9.1*  HCT 32.0* 35.2* 32.3* 28.8*  MCV 86.0 86.9 84.6 84.0  PLT 153 150 147* 140*   Recent Labs  Lab 06/19/20 1744 06/20/20 0512 06/20/20 0636 06/21/20 0238  NA 138  --  140 138  K 4.3  --  3.4* 3.8  CL 101  --  100 105  CO2 26  --  26 23  GLUCOSE 120*  --  101* 127*  BUN 80*  --  74* 71*  CREATININE 4.53* 4.47* 4.35* 3.96*  CALCIUM 11.7*  --  11.3* 10.2  MG  --   --   --  1.3*  PHOS  --   --  3.9 3.0    F/u labs ordered Unresulted Labs (From admission, onward)          Start     Ordered   06/21/20 9102  Basic metabolic panel  Daily,   R      06/20/20 0948   06/21/20 0500  CBC with Differential/Platelet  Daily,   R      06/20/20 0948   06/21/20 0500  Multiple Myeloma Panel (SPEP&IFE w/QIG)  Once,   R        06/21/20 0500   06/20/20 1640  Kappa/lambda light chains  Once,   AD        06/20/20 1639   06/20/20  0011  Urinalysis, Routine w reflex microscopic Nasopharyngeal Swab  ONCE - STAT,   STAT        06/20/20 0010          Signed, Terrilee Croak, MD [facility] 06/21/2020

## 2020-06-22 LAB — CBC WITH DIFFERENTIAL/PLATELET
Abs Immature Granulocytes: 0.01 10*3/uL (ref 0.00–0.07)
Basophils Absolute: 0 10*3/uL (ref 0.0–0.1)
Basophils Relative: 1 %
Eosinophils Absolute: 0.3 10*3/uL (ref 0.0–0.5)
Eosinophils Relative: 6 %
HCT: 27.8 % — ABNORMAL LOW (ref 36.0–46.0)
Hemoglobin: 9.4 g/dL — ABNORMAL LOW (ref 12.0–15.0)
Immature Granulocytes: 0 %
Lymphocytes Relative: 49 %
Lymphs Abs: 2 10*3/uL (ref 0.7–4.0)
MCH: 27.9 pg (ref 26.0–34.0)
MCHC: 33.8 g/dL (ref 30.0–36.0)
MCV: 82.5 fL (ref 80.0–100.0)
Monocytes Absolute: 0.5 10*3/uL (ref 0.1–1.0)
Monocytes Relative: 12 %
Neutro Abs: 1.3 10*3/uL — ABNORMAL LOW (ref 1.7–7.7)
Neutrophils Relative %: 32 %
Platelets: 136 10*3/uL — ABNORMAL LOW (ref 150–400)
RBC: 3.37 MIL/uL — ABNORMAL LOW (ref 3.87–5.11)
RDW: 15.8 % — ABNORMAL HIGH (ref 11.5–15.5)
WBC: 4.1 10*3/uL (ref 4.0–10.5)
nRBC: 0 % (ref 0.0–0.2)

## 2020-06-22 LAB — BASIC METABOLIC PANEL
Anion gap: 9 (ref 5–15)
BUN: 62 mg/dL — ABNORMAL HIGH (ref 8–23)
CO2: 25 mmol/L (ref 22–32)
Calcium: 9.9 mg/dL (ref 8.9–10.3)
Chloride: 106 mmol/L (ref 98–111)
Creatinine, Ser: 3.54 mg/dL — ABNORMAL HIGH (ref 0.44–1.00)
GFR, Estimated: 13 mL/min — ABNORMAL LOW (ref >60)
Glucose, Bld: 130 mg/dL — ABNORMAL HIGH (ref 70–99)
Potassium: 3.7 mmol/L (ref 3.5–5.1)
Sodium: 140 mmol/L (ref 135–145)

## 2020-06-22 LAB — GLUCOSE, CAPILLARY
Glucose-Capillary: 75 mg/dL (ref 70–99)
Glucose-Capillary: 119 mg/dL — ABNORMAL HIGH (ref 70–99)
Glucose-Capillary: 100 mg/dL — ABNORMAL HIGH (ref 70–99)
Glucose-Capillary: 137 mg/dL — ABNORMAL HIGH (ref 70–99)

## 2020-06-22 NOTE — Progress Notes (Signed)
PROGRESS NOTE  Grace Bradford  DOB: [birth year]  PCP: Jani Gravel, MD KYH:[id number]  DOA: 06/19/2020  LOS: 2 days   Chief Complaint  Patient presents with  . Fatigue   Brief narrative: Grace Bradford is a 75 y.o. female with PMH significant for DM2, HTN, chronic combined systolic and diastolic CHF (EF 35 to 15% 3/21), pulmonary hypertension, nonobstructive CAD, CKD 4, chronic anemia, hypothyroidism.  Lives at home with her husband, ambulates with a walker. 2/9, patient was seen by cardiologist, Wilder Glade was added for CHF.  2/23, seen by nephrologist, blood work showed creatinine elevated to 2.2 and calcium elevated to >11 and hence directed to ED.  In the ED, patient looked dehydrated but was hemodynamically stable. Labs showed creatinine elevated to 4.5, calcium elevated to 11.7 Patient was started on normal saline and was admitted to hospitalist service. Heart failure consultation was obtained See below for details  Subjective: Patient was seen and examined this morning.   Not in distress.  Not in pain this morning.  Not on supplemental oxygen.  Remains euvolemic . Assessment/Plan: AKI on CKD 4 -Patient is in a CHF regimen which includes Entresto, torsemide and metolazone.  On top of that Wilder Glade was recently added.  Patient probably started to have dehydration and prerenal AKI after that.  Patient denies taking any NSAIDs. -Baseline creatinine 1.36 from a year ago.  Presented with a creatinine of 4.53.  With adjustment of medications and IV fluid, creatinine improved to 3.54 this morning.  Not on IV fluid.  Continue to monitor. -Renal ultrasound unremarkable. -Follows up outpatient with Dr. Hollie Salk. Recent Labs    07/11/19 1502 06/04/20 1530 06/19/20 1744 06/20/20 0512 06/20/20 0636 06/21/20 0238 06/22/20 0444  BUN 28* 51* 80*  --  74* 71* 62*  CREATININE 1.36* 2.94* 4.53* 4.47* 4.35* 3.96* 3.54*   Chronic combined systolic and diastolic CHF Essential  hypertension Pulmonary hypertension -Last echo from 3/21 with EF 35 to 40%. -Home meds include Coreg 6.25 mg twice daily, Entresto 97/103 mg twice daily, Farxiga 10 mg daily, torsemide 50 mg daily, metolazone 2.5 mg daily as needed, amlodipine 10 mg daily, sildenafil 60 mg 3 times daily, Corlanor 7.5 mg twice daily -Currently on hold are amlodipine, Entresto, Farxiga, torsemide and metolazone. -Continue Coreg, sildenafil and Corlanor. -Heart failure team following. -Monitor electrolytes Recent Labs  Lab 06/19/20 1744 06/20/20 0636 06/21/20 0238 06/22/20 0444  K 4.3 3.4* 3.8 3.7  MG  --   --  1.3*  --   PHOS  --  3.9 3.0  --    Hypokalemia/hypomagnesemia -Potassium level improved.  Magnesium level was low at 1.3 yesterday.  Replacement given.  Repeat level tomorrow.  Hypercalcemia -Calcium level was elevated to 11.7 on presentation probably due to dehydration. -Improved now. -In order to rule out multiple myeloma as other possible etiology of AKI and hypercalcemia, bone scan was done which is normal.  SPEP, UPEP ordered by cardiology. Recent Labs  Lab 06/19/20 1744 06/20/20 0636 06/21/20 0238 06/22/20 0444  CALCIUM 11.7* 11.3* 10.2 9.9   Type 2 diabetes mellitus -A1c 6.1 on 2/25 -Home meds include Levemir 15 units daily, Trulicity 1.5 mg subcu weekly on Thursday and recently added on Farxiga 10 mg daily primary for heart failure purpose. -Currently blood sugar level is controlled only on sliding scale insulin with Accu-Cheks.  As renal function improves, may need more insulin.. Lab Results  Component Value Date   HGBA1C 6.1 (H) 06/20/2020   Recent Labs  Lab  06/21/20 2376 06/21/20 1130 06/21/20 1649 06/21/20 2113 06/22/20 0647  GLUCAP 76 98 125* 125* 75   History of CAD nonobstructive -Continue aspirin, Plavix and statin.  Chronic anemia -Stable hemoglobin -Continue PPI Recent Labs    06/19/20 1744 06/20/20 0512 06/20/20 0636 06/21/20 0238 06/22/20 0444   HGB 10.2* 11.0* 10.3* 9.1* 9.4*  MCV 86.0 86.9 84.6 84.0 82.5   Hypothyroidism -Continue Synthroid  History of sleep apnea  -on dental appliances.  Chronic right lower extremity pain  -She states it is related to sciatica. -Continue home medicine that include Skelaxin 3 times daily, Valium as needed, Lyrica twice daily and Voltaren gel..  Mobility: PT eval obtained.  Home with PT recommended Code Status:   Code Status: Full Code  Nutritional status: Body mass index is 36.2 kg/m.     Diet Order            Diet heart healthy/carb modified Room service appropriate? Yes; Fluid consistency: Thin; Fluid restriction: 1200 mL Fluid  Diet effective now                 DVT prophylaxis: heparin injection 5,000 Units Start: 06/20/20 0600   Antimicrobials:  None Fluid: None IV fluid Consultants: CHF Family Communication:  None at bedside  Status is: Inpatient  Remains inpatient appropriate because: Renal function is still not back to baseline, needs further optimization of heart failure medications   Dispo: The patient is from: Home              Anticipated d/c is to: Home with home health PT probably in 2 to 3 days              Patient currently is not medically stable to d/c.   Difficult to place patient No   Infusions:    Scheduled Meds: . allopurinol  50 mg Oral Daily  . aspirin EC  81 mg Oral Daily  . carvedilol  6.25 mg Oral BID WC  . clopidogrel  75 mg Oral Daily  . diclofenac Sodium  2 g Topical QID  . fluticasone  2 spray Each Nare Daily  . heparin  5,000 Units Subcutaneous Q8H  . hydrOXYzine  50 mg Oral BID  . insulin aspart  0-9 Units Subcutaneous TID WC  . ivabradine  7.5 mg Oral BID WC  . levothyroxine  100 mcg Oral Once per day on Mon Tue Wed Thu Fri  . pantoprazole  40 mg Oral BID  . pravastatin  80 mg Oral q1800  . pregabalin  150 mg Oral QHS  . pregabalin  75 mg Oral Daily  . sildenafil  60 mg Oral TID    Antimicrobials: Anti-infectives (From  admission, onward)   None      PRN meds: acetaminophen **OR** acetaminophen, diazepam, guaiFENesin-dextromethorphan, hydrALAZINE, HYDROcodone-acetaminophen   Objective: Vitals:   06/22/20 0444 06/22/20 0938  BP: (!) 150/61 (!) 129/46  Pulse: (!) 59 (!) 58  Resp:  18  Temp: 97.7 F (36.5 C) 98.5 F (36.9 C)  SpO2: 95% 98%    Intake/Output Summary (Last 24 hours) at 06/22/2020 1057 Last data filed at 06/22/2020 0800 Gross per 24 hour  Intake 1440 ml  Output 0 ml  Net 1440 ml   Filed Weights   06/20/20 1400 06/22/20 0500  Weight: 83 kg 86.9 kg   Weight change: 3.892 kg Body mass index is 36.2 kg/m.   Physical Exam: General exam: Pleasant elderly African-American female.  Not in distress Skin: No rashes, lesions  or ulcers. HEENT: Atraumatic, normocephalic, no obvious bleeding Lungs: Clear to auscultation bilaterally CVS: Regular rate and rhythm, no murmur GI/Abd soft, nontender, nondistended, bowel sound present CNS: Alert, awake, oriented x3 Psychiatry: Mood appropriate Extremities: No pedal edema, no calf tenderness  Data Review: I have personally reviewed the laboratory data and studies available.  Recent Labs  Lab 06/19/20 1744 06/20/20 0512 06/20/20 0636 06/21/20 0238 06/22/20 0444  WBC 4.6 5.5 5.4 4.5 4.1  NEUTROABS  --   --  2.5 1.7 1.3*  HGB 10.2* 11.0* 10.3* 9.1* 9.4*  HCT 32.0* 35.2* 32.3* 28.8* 27.8*  MCV 86.0 86.9 84.6 84.0 82.5  PLT 153 150 147* 140* 136*   Recent Labs  Lab 06/19/20 1744 06/20/20 0512 06/20/20 0636 06/21/20 0238 06/22/20 0444  NA 138  --  140 138 140  K 4.3  --  3.4* 3.8 3.7  CL 101  --  100 105 106  CO2 26  --  _0 GLUCOSE 120*  --  101* 127* 130*  BUN 80*  --  74* 71* 62*  CREATININE 4.53* 4.47* 4.35* 3.96* 3.54*  CALCIUM 11.7*  --  11.3* 10.2 9.9  MG  --   --   --  1.3*  --   PHOS  --   --  3.9 3.0  --     F/u labs ordered Unresulted Labs (From admission, onward)          Start     Ordered    06/23/20 0500  Magnesium  Tomorrow morning,   STAT       Question:  Specimen collection method  Answer:  Lab=Lab collect   06/22/20 1057   06/21/20 6568  Basic metabolic panel  Daily,   R      06/20/20 0948   06/21/20 0500  CBC with Differential/Platelet  Daily,   R      06/20/20 0948   06/21/20 0500  Multiple Myeloma Panel (SPEP&IFE w/QIG)  Once,   R        06/21/20 0500   06/20/20 1640  Kappa/lambda light chains  Once,   AD        06/20/20 1639   06/20/20 0011  Urinalysis, Routine w reflex microscopic Nasopharyngeal Swab  ONCE - STAT,   STAT        06/20/20 0010          Signed, Terrilee Croak, MD [facility] 06/22/2020

## 2020-06-23 ENCOUNTER — Other Ambulatory Visit (HOSPITAL_COMMUNITY): Payer: Medicare Other

## 2020-06-23 LAB — CBC WITH DIFFERENTIAL/PLATELET
Abs Immature Granulocytes: 0.02 10*3/uL (ref 0.00–0.07)
Basophils Absolute: 0 10*3/uL (ref 0.0–0.1)
Basophils Relative: 1 %
Eosinophils Absolute: 0.2 10*3/uL (ref 0.0–0.5)
Eosinophils Relative: 4 %
HCT: 29.1 % — ABNORMAL LOW (ref 36.0–46.0)
Hemoglobin: 9.2 g/dL — ABNORMAL LOW (ref 12.0–15.0)
Immature Granulocytes: 1 %
Lymphocytes Relative: 47 %
Lymphs Abs: 2 10*3/uL (ref 0.7–4.0)
MCH: 26.7 pg (ref 26.0–34.0)
MCHC: 31.6 g/dL (ref 30.0–36.0)
MCV: 84.3 fL (ref 80.0–100.0)
Monocytes Absolute: 0.5 10*3/uL (ref 0.1–1.0)
Monocytes Relative: 12 %
Neutro Abs: 1.5 10*3/uL — ABNORMAL LOW (ref 1.7–7.7)
Neutrophils Relative %: 35 %
Platelets: 142 10*3/uL — ABNORMAL LOW (ref 150–400)
RBC: 3.45 MIL/uL — ABNORMAL LOW (ref 3.87–5.11)
RDW: 15.9 % — ABNORMAL HIGH (ref 11.5–15.5)
WBC: 4.2 10*3/uL (ref 4.0–10.5)
nRBC: 0 % (ref 0.0–0.2)

## 2020-06-23 LAB — GLUCOSE, CAPILLARY
Glucose-Capillary: 83 mg/dL (ref 70–99)
Glucose-Capillary: 103 mg/dL — ABNORMAL HIGH (ref 70–99)
Glucose-Capillary: 105 mg/dL — ABNORMAL HIGH (ref 70–99)
Glucose-Capillary: 122 mg/dL — ABNORMAL HIGH (ref 70–99)

## 2020-06-23 LAB — KAPPA/LAMBDA LIGHT CHAINS
Kappa free light chain: 72.4 mg/L — ABNORMAL HIGH (ref 3.3–19.4)
Kappa, lambda light chain ratio: 1.55 (ref 0.26–1.65)
Lambda free light chains: 46.8 mg/L — ABNORMAL HIGH (ref 5.7–26.3)

## 2020-06-23 LAB — BASIC METABOLIC PANEL
Anion gap: 9 (ref 5–15)
BUN: 56 mg/dL — ABNORMAL HIGH (ref 8–23)
CO2: 24 mmol/L (ref 22–32)
Calcium: 9.8 mg/dL (ref 8.9–10.3)
Chloride: 106 mmol/L (ref 98–111)
Creatinine, Ser: 3.48 mg/dL — ABNORMAL HIGH (ref 0.44–1.00)
GFR, Estimated: 13 mL/min — ABNORMAL LOW (ref >60)
Glucose, Bld: 122 mg/dL — ABNORMAL HIGH (ref 70–99)
Potassium: 3.9 mmol/L (ref 3.5–5.1)
Sodium: 139 mmol/L (ref 135–145)

## 2020-06-23 LAB — MAGNESIUM: Magnesium: 1.6 mg/dL — ABNORMAL LOW (ref 1.7–2.4)

## 2020-06-23 MED ORDER — MAGNESIUM SULFATE 4 GM/100ML IV SOLN
4.0000 g | Freq: Once | INTRAVENOUS | Status: AC
  Administered 2020-06-23: 4 g via INTRAVENOUS
  Filled 2020-06-23: qty 100

## 2020-06-23 NOTE — Plan of Care (Signed)
  Problem: Education: Goal: Knowledge of General Education information will improve Description Including pain rating scale, medication(s)/side effects and non-pharmacologic comfort measures Outcome: Progressing   

## 2020-06-23 NOTE — Progress Notes (Signed)
Physical Therapy Treatment Patient Details Name: Grace Bradford MRN: [id number] DOB: [birth year] Today's Date: 06/23/2020    History of Present Illness Pt is a 75 y/o female who was sent to ED by her nephrologist for worsening kidney function. In ED, diagnosed with acute renal failure. PMH signifcant for DM, CAD, HTN, CKD, combined chronic HF, prior CHF, cardiomyopathy, and OSA.    PT Comments    Pt received in bed, pleasantly talking on phone but unwilling to attempt OOB mobility. Pt needing to complete stairs before d/c but refusing to attempt. Reporting chronic LE and back pain, however inconsistent with body language and pleasant tone of voice. Happy to participate in conversation but refused all mobility. Pt illogically stating that she will be fine on stairs at home, even though she reported multiple falls using stairs during her evaluation. PT repeatedly reiterated importance of physically reviewing safer techniques for stairs, pt unreceptive to trying. Education on appropriate stair sequencing and option of sidestepping on stairs so pt can use both hands on railing. Recommended husband or daughter always be present for assistance as needed on stairs or pt live on first floor until strength and balance improve. Pt left in bed with all needs met, call bell within reach, and bed alarm active.   Follow Up Recommendations  Home health PT;Supervision for mobility/OOB     Equipment Recommendations  None recommended by PT    Recommendations for Other Services       Precautions / Restrictions Precautions Precautions: Fall Restrictions Weight Bearing Restrictions: No    Mobility  Bed Mobility               General bed mobility comments: refused    Transfers                 General transfer comment: refused  Ambulation/Gait             General Gait Details: refused   Stairs             Wheelchair Mobility    Modified Rankin (Stroke Patients Only)        Balance Overall balance assessment: Modified Independent Sitting-balance support: Feet supported Sitting balance-Leahy Scale: Good     Standing balance support: Bilateral upper extremity supported;During functional activity Standing balance-Leahy Scale: Fair Standing balance comment: Reliant on B UE support                            Cognition Arousal/Alertness: Awake/alert Behavior During Therapy: WFL for tasks assessed/performed Overall Cognitive Status: Impaired/Different from baseline Area of Impairment: Safety/judgement;Problem solving                         Safety/Judgement: Decreased awareness of safety;Decreased awareness of deficits     General Comments: Tangential answers.      Exercises      General Comments General comments (skin integrity, edema, etc.): Refused mobility due to chronic L LE and back pain. Pt education on stairs and fall prevention      Pertinent Vitals/Pain Pain Assessment: Faces Faces Pain Scale: Hurts a little bit Pain Location: back and legs Pain Descriptors / Indicators: Aching;Discomfort Pain Intervention(s): Monitored during session;Limited activity within patient's tolerance    Home Living                      Prior Function  PT Goals (current goals can now be found in the care plan section)      Frequency    Min 3X/week      PT Plan Current plan remains appropriate    Co-evaluation              AM-PAC PT "6 Clicks" Mobility   Outcome Measure  Help needed turning from your back to your side while in a flat bed without using bedrails?: None Help needed moving from lying on your back to sitting on the side of a flat bed without using bedrails?: A Little Help needed moving to and from a bed to a chair (including a wheelchair)?: A Little Help needed standing up from a chair using your arms (e.g., wheelchair or bedside chair)?: A Little Help needed to walk in  hospital room?: A Little Help needed climbing 3-5 steps with a railing? : A Lot 6 Click Score: 18    End of Session   Activity Tolerance: Patient limited by pain Patient left: in bed;with call bell/phone within reach;with bed alarm set Nurse Communication: Mobility status PT Visit Diagnosis: Muscle weakness (generalized) (M62.81);Difficulty in walking, not elsewhere classified (R26.2);Pain;History of falling (Z91.81) Pain - part of body: Leg     Time:  -     Charges:                        Rosita Kea, SPT

## 2020-06-23 NOTE — Progress Notes (Addendum)
Advanced Heart Failure Rounding Note  PCP-Cardiologist: No primary care provider on file.   Subjective:    Scr steadily improving. 4.53 on admit, down to 3.48 today.   Ca levels down after discontinuation of ca supplements. 9.8 today  Bone survey showed no evidence of lytic or sclerotic lesions. Multiple Myeloma panel and kappa/lambda light chains still in process.   No wt charted yet. Feels well. No complaints. Denies dyspnea.    Objective:   Weight Range: 86.9 kg Body mass index is 36.2 kg/m.   Vital Signs:   Temp:  [97.9 F (36.6 C)-98.7 F (37.1 C)] 97.9 F (36.6 C) (02/28 0545) Pulse Rate:  [58-70] 64 (02/28 0545) Resp:  [16-18] 18 (02/28 0545) BP: (129-149)/(46-72) 141/72 (02/28 0545) SpO2:  [96 %-99 %] 96 % (02/28 0545) Last BM Date: 06/20/20  Weight change: Filed Weights   06/20/20 1400 06/22/20 0500  Weight: 83 kg 86.9 kg    Intake/Output:   Intake/Output Summary (Last 24 hours) at 06/23/2020 0711 Last data filed at 06/22/2020 2200 Gross per 24 hour  Intake 1160 ml  Output --  Net 1160 ml      Physical Exam    General:  Well appearing. No resp difficulty HEENT: Normal Neck: Supple. JVP not elevated . Carotids 2+ bilat; no bruits. No lymphadenopathy or thyromegaly appreciated. Cor: PMI nondisplaced. Regular rate & rhythm. No rubs, gallops or murmurs. Lungs: Clear Abdomen: Soft, nontender, nondistended. No hepatosplenomegaly. No bruits or masses. Good bowel sounds. Extremities: No cyanosis, clubbing, rash, edema Neuro: Alert & orientedx3, cranial nerves grossly intact. moves all 4 extremities w/o difficulty. Affect pleasant   Telemetry   NSR 60s   EKG    No new EKG to review.   Labs    CBC Recent Labs    06/22/20 0444 06/23/20 0253  WBC 4.1 4.2  NEUTROABS 1.3* 1.5*  HGB 9.4* 9.2*  HCT 27.8* 29.1*  MCV 82.5 84.3  PLT 136* 124*   Basic Metabolic Panel Recent Labs    06/21/20 0238 06/22/20 0444 06/23/20 0253  NA 138 140  139  K 3.8 3.7 3.9  CL 105 106 106  CO2 _0 GLUCOSE 127* 130* 122*  BUN 71* 62* 56*  CREATININE 3.96* 3.54* 3.48*  CALCIUM 10.2 9.9 9.8  MG 1.3*  --  1.6*  PHOS 3.0  --   --    Liver Function Tests No results for input(s): AST, ALT, ALKPHOS, BILITOT, PROT, ALBUMIN in the last 72 hours. No results for input(s): LIPASE, AMYLASE in the last 72 hours. Cardiac Enzymes No results for input(s): CKTOTAL, CKMB, CKMBINDEX, TROPONINI in the last 72 hours.  BNP: BNP (last 3 results) Recent Labs    06/04/20 1530  BNP 202.4*    ProBNP (last 3 results) No results for input(s): PROBNP in the last 8760 hours.   D-Dimer No results for input(s): DDIMER in the last 72 hours. Hemoglobin A1C No results for input(s): HGBA1C in the last 72 hours. Fasting Lipid Panel No results for input(s): CHOL, HDL, LDLCALC, TRIG, CHOLHDL, LDLDIRECT in the last 72 hours. Thyroid Function Tests No results for input(s): TSH, T4TOTAL, T3FREE, THYROIDAB in the last 72 hours.  Invalid input(s): FREET3  Other results:   Imaging     No results found.   Medications:     Scheduled Medications: . allopurinol  50 mg Oral Daily  . aspirin EC  81 mg Oral Daily  . carvedilol  6.25 mg Oral BID WC  .  clopidogrel  75 mg Oral Daily  . diclofenac Sodium  2 g Topical QID  . fluticasone  2 spray Each Nare Daily  . heparin  5,000 Units Subcutaneous Q8H  . hydrOXYzine  50 mg Oral BID  . insulin aspart  0-9 Units Subcutaneous TID WC  . ivabradine  7.5 mg Oral BID WC  . levothyroxine  100 mcg Oral Once per day on Mon Tue Wed Thu Fri  . pantoprazole  40 mg Oral BID  . pravastatin  80 mg Oral q1800  . pregabalin  150 mg Oral QHS  . pregabalin  75 mg Oral Daily  . sildenafil  60 mg Oral TID     Infusions:   PRN Medications:  acetaminophen **OR** acetaminophen, diazepam, guaiFENesin-dextromethorphan, hydrALAZINE, HYDROcodone-acetaminophen    Assessment/Plan    1. 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 More recently creatinine running 1.4-1.6 (GFR 35-45) 11/21. - Recently started on an SGLT2i (Farxiga) on 2/9 - SCr 2.94>>4.53 on admit - treated w/ IVF hydration. Farxiga and diuretics held  - Scr gradually improving, down to 3.48 today  - renal US shows mildly increased renal cortical cortical echogenicity but otherwise unremarkable.   - Recent weight loss, AKI and hypercalcemia raised concern for possible myeloma - Multiple Myeloma panel, kappa/lambda light chains pending but bone survey w/ no evidence of lytic or sclerotic lesions.  - Suspect pre-renal AKI from overdiuresis  - She will stay off [place] - Continue to hold Entresto and torsemide for now - Avoid hypotension  - Follow BMP   2. Hypercalcemia  - Ca 11 on admit (down from 14 on recent outpatient labs) - Calcitrol and calcium replacement has been discontinued  - levels now normalized, 9.8  - bone survey w/ no evidence of lytic or sclerotic lesions.     3. Chronic Systolic Heart Failure NICM,EF 30% (10/2014). -->Echo 06/2016, EF 45-50% - Echo 4/19 EF 40-45%  - Echo3/21EF35-40% moderate RV dysfunction - Volume status ok currently on exam.  - Continue to hold Entresto and Torsemide w/ AKI  - ok to continue Coreg and Sildenafil (for RV dysjunction) - she will stay off Farxiga  - no Spiro nor dig given renal dysfunction  - RN to check wt today. Will need to decide timing of restart of diuretics    4. Type 2DM  - Controlled. Hgb A1c 6.1  - SSI  - Wilder Glade has been discontinued   5. Anemia - chronic,  Hgb 9.2 today - ? Anemia of chronic disease from CKD   6. Hypokalemia  - Resolved, K 3.9 today.   Length of Stay: [address], PA-C  06/23/2020, 7:11 AM  Advanced Heart Failure Team Pager [phone] (M-F; 7a - 4p)  Please contact [facility] for night-coverage after hours (4p -7a ) and weekends on  [email or web address]  Patient seen and examined with the above-signed Advanced Practice Provider and/or Housestaff. I personally reviewed laboratory data, imaging studies and relevant notes. I independently examined the patient and formulated the important aspects of the plan. I have edited the note to reflect any of my changes or salient points. I have personally discussed the plan with the patient and/or family.  Diuretics remain on hold. Creatinine continues to improve. Calcium down to 9.8. Skeletal survey negative. Will reach out to lab to get results of SPEP and serum light chains. If negative will need CT C/A/P (no  contrast) to further investigate for malignancy with 50 pound weight loss and hypercalcemia.   Glori Bickers, MD  8:55 AM
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
## 2020-06-23 NOTE — Care Management Important Message (Signed)
Important Message  Patient Details  Name: Grace Bradford MRN: [id number] Date of Birth: 09/22/1945   Medicare Important Message Given:  Yes     Barb Merino [place] 06/23/2020, 2:20 PM

## 2020-06-23 NOTE — Progress Notes (Signed)
PROGRESS NOTE  Grace Bradford  DOB: [birth year]  PCP: Jani Gravel, MD UPJ:[id number]  DOA: 06/19/2020  LOS: 3 days   Chief Complaint  Patient presents with  . Fatigue   Brief narrative: Grace Bradford is a 75 y.o. female with PMH significant for DM2, HTN, chronic combined systolic and diastolic CHF (EF 35 to 92% 3/21), pulmonary hypertension, nonobstructive CAD, CKD 4, chronic anemia, hypothyroidism.  Lives at home with her husband, ambulates with a walker. 2/9, patient was seen by cardiologist, Wilder Glade was added for CHF.  2/23, seen by nephrologist, blood work showed creatinine elevated to 2.2 and calcium elevated to >11 and hence directed to ED.  In the ED, patient looked dehydrated but was hemodynamically stable. Labs showed creatinine elevated to 4.5, calcium elevated to 11.7 Admitted to hospitalist service. Heart failure consultation was obtained. See below for details.  Subjective: Patient was seen and examined this morning.   Not in distress.  No new symptoms.  Not on supplemental oxygen.  Remains euvolemic.  CHF service follow-up appreciated..  Assessment/Plan: AKI on CKD 4 -Patient is in a CHF regimen which includes Entresto, torsemide and metolazone. On top of that Wilder Glade was recently added.  Patient probably started to have dehydration and prerenal AKI after that.  Patient denies taking any NSAIDs. -Baseline creatinine 1.36 from a year ago.  Presented with a creatinine of 4.53.  With adjustment of medications and IV fluid, creatinine improved to 3.48 this morning.  Not on IV fluid.  Continue to monitor. -Renal ultrasound unremarkable. -Follows up outpatient with Dr. Hollie Salk. Recent Labs    07/11/19 1502 06/04/20 1530 06/19/20 1744 06/20/20 0512 06/20/20 0636 06/21/20 0238 06/22/20 0444 06/23/20 0253  BUN 28* 51* 80*  --  74* 71* 62* 56*  CREATININE 1.36* 2.94* 4.53* 4.47* 4.35* 3.96* 3.54* 3.48*   Chronic combined systolic and diastolic CHF Essential  hypertension Pulmonary hypertension -Last echo from 3/21 with EF 35 to 40%. -Home meds include Coreg 6.25 mg twice daily, Entresto 97/103 mg twice daily, Farxiga 10 mg daily, torsemide 50 mg daily, metolazone 2.5 mg daily as needed, amlodipine 10 mg daily, sildenafil 60 mg 3 times daily, Corlanor 7.5 mg twice daily. -Continue Coreg, sildenafil and Corlanor. -Continue to hold amlodipine, Entresto, Farxiga, torsemide and metolazone. -Heart failure team following. -Monitor electrolytes Recent Labs  Lab 06/19/20 1744 06/20/20 0636 06/21/20 0238 06/22/20 0444 06/23/20 0253  K 4.3 3.4* 3.8 3.7 3.9  MG  --   --  1.3*  --  1.6*  PHOS  --  3.9 3.0  --   --    Hypokalemia/hypomagnesemia -Potassium level improved.  Magnesium level low at 1.3 today.  Will order for replacement. Repeat level tomorrow.  Hypercalcemia -Calcium level was elevated to 11.7 on presentation probably due to dehydration. -Improved now. -In order to rule out multiple myeloma as other possible etiology of AKI and hypercalcemia, bone scan was done which is normal.  SPEP, UPEP level pending Recent Labs  Lab 06/19/20 1744 06/20/20 0636 06/21/20 0238 06/22/20 0444 06/23/20 0253  CALCIUM 11.7* 11.3* 10.2 9.9 9.8   Type 2 diabetes mellitus -A1c 6.1 on 2/25 -Home meds include Levemir 15 units daily, Trulicity 1.5 mg subcu weekly on Thursday and recently added on Farxiga 10 mg daily primary for heart failure purpose. -Currently blood sugar level is controlled only on sliding scale insulin with Accu-Cheks.  As renal function improves, may need more insulin. Recent Labs  Lab 06/22/20 [phone] 06/22/20 1144 06/22/20 1638 06/22/20 2118  06/23/20 0649  GLUCAP 75 119* 100* 137* 83   History of CAD nonobstructive -Continue aspirin, Plavix and statin.  Chronic anemia -Stable hemoglobin -Continue PPI Recent Labs    06/20/20 0512 06/20/20 0636 06/21/20 0238 06/22/20 0444 06/23/20 0253  HGB 11.0* 10.3* 9.1* 9.4* 9.2*   MCV 86.9 84.6 84.0 82.5 84.3   Hypothyroidism -Continue Synthroid  History of sleep apnea  -on dental appliances.  Chronic right lower extremity pain  -She states it is related to sciatica. -Continue home medicine that include Skelaxin 3 times daily, Valium as needed, Lyrica twice daily and Voltaren gel.  Mobility: PT eval obtained.  Home with PT recommended Code Status:   Code Status: Full Code  Nutritional status: Body mass index is 35.87 kg/m.     Diet Order            Diet heart healthy/carb modified Room service appropriate? Yes; Fluid consistency: Thin; Fluid restriction: 1200 mL Fluid  Diet effective now                 DVT prophylaxis: heparin injection 5,000 Units Start: 06/20/20 0600   Antimicrobials:  None Fluid: None IV fluid Consultants: CHF Family Communication:  None at bedside  Status is: Inpatient  Remains inpatient appropriate because: Renal function is still not back to baseline, needs further optimization of heart failure medications   Dispo: The patient is from: Home              Anticipated d/c is to: Home with home health PT probably in 2 to 3 days              Patient currently is not medically stable to d/c.   Difficult to place patient No   Infusions:  . magnesium sulfate bolus IVPB      Scheduled Meds: . allopurinol  50 mg Oral Daily  . aspirin EC  81 mg Oral Daily  . carvedilol  6.25 mg Oral BID WC  . clopidogrel  75 mg Oral Daily  . diclofenac Sodium  2 g Topical QID  . fluticasone  2 spray Each Nare Daily  . heparin  5,000 Units Subcutaneous Q8H  . hydrOXYzine  50 mg Oral BID  . insulin aspart  0-9 Units Subcutaneous TID WC  . ivabradine  7.5 mg Oral BID WC  . levothyroxine  100 mcg Oral Once per day on Mon Tue Wed Thu Fri  . pantoprazole  40 mg Oral BID  . pravastatin  80 mg Oral q1800  . pregabalin  150 mg Oral QHS  . pregabalin  75 mg Oral Daily  . sildenafil  60 mg Oral TID    Antimicrobials: Anti-infectives  (From admission, onward)   None      PRN meds: acetaminophen **OR** acetaminophen, diazepam, guaiFENesin-dextromethorphan, hydrALAZINE, HYDROcodone-acetaminophen   Objective: Vitals:   06/23/20 0545 06/23/20 0819  BP: (!) 141/72 (!) 136/58  Pulse: 64 61  Resp: 18 18  Temp: 97.9 F (36.6 C) 97.8 F (36.6 C)  SpO2: 96% 97%    Intake/Output Summary (Last 24 hours) at 06/23/2020 1043 Last data filed at 06/22/2020 2200 Gross per 24 hour  Intake 800 ml  Output --  Net 800 ml   Filed Weights   06/20/20 1400 06/22/20 0500 06/23/20 0813  Weight: 83 kg 86.9 kg 86.1 kg   Weight change:  Body mass index is 35.87 kg/m.   Physical Exam: General exam: Pleasant elderly African-American female.  Not in distress Skin: No rashes, lesions  or ulcers. HEENT: Atraumatic, normocephalic, no obvious bleeding Lungs: Clear to auscultation bilaterally CVS: Regular rate and rhythm, no murmur GI/Abd soft, nontender, nondistended, bowel sound present CNS: Alert, awake, oriented x3 Psychiatry: Mood appropriate Extremities: No pedal edema, no calf tenderness  Data Review: I have personally reviewed the laboratory data and studies available.  Recent Labs  Lab 06/20/20 0512 06/20/20 0636 06/21/20 0238 06/22/20 0444 06/23/20 0253  WBC 5.5 5.4 4.5 4.1 4.2  NEUTROABS  --  2.5 1.7 1.3* 1.5*  HGB 11.0* 10.3* 9.1* 9.4* 9.2*  HCT 35.2* 32.3* 28.8* 27.8* 29.1*  MCV 86.9 84.6 84.0 82.5 84.3  PLT 150 147* 140* 136* 142*   Recent Labs  Lab 06/19/20 1744 06/20/20 0512 06/20/20 0636 06/21/20 0238 06/22/20 0444 06/23/20 0253  NA 138  --  140 138 140 139  K 4.3  --  3.4* 3.8 3.7 3.9  CL 101  --  100 105 106 106  CO2 26  --  _0 GLUCOSE 120*  --  101* 127* 130* 122*  BUN 80*  --  74* 71* 62* 56*  CREATININE 4.53* 4.47* 4.35* 3.96* 3.54* 3.48*  CALCIUM 11.7*  --  11.3* 10.2 9.9 9.8  MG  --   --   --  1.3*  --  1.6*  PHOS  --   --  3.9 3.0  --   --     F/u labs ordered Unresulted  Labs (From admission, onward)          Start     Ordered   06/21/20 0500  Multiple Myeloma Panel (SPEP&IFE w/QIG)  Once,   R        06/21/20 0500   06/20/20 1640  Kappa/lambda light chains  Once,   AD        06/20/20 1639   06/20/20 0011  Urinalysis, Routine w reflex microscopic Nasopharyngeal Swab  ONCE - STAT,   STAT        06/20/20 0010          Signed, Terrilee Croak, MD [facility] 06/23/2020

## 2020-06-24 ENCOUNTER — Inpatient Hospital Stay (HOSPITAL_COMMUNITY): Payer: Medicare Other

## 2020-06-24 LAB — BASIC METABOLIC PANEL
Anion gap: 9 (ref 5–15)
BUN: 54 mg/dL — ABNORMAL HIGH (ref 8–23)
CO2: 24 mmol/L (ref 22–32)
Calcium: 9.8 mg/dL (ref 8.9–10.3)
Chloride: 104 mmol/L (ref 98–111)
Creatinine, Ser: 3.23 mg/dL — ABNORMAL HIGH (ref 0.44–1.00)
GFR, Estimated: 15 mL/min — ABNORMAL LOW (ref >60)
Glucose, Bld: 124 mg/dL — ABNORMAL HIGH (ref 70–99)
Potassium: 4.1 mmol/L (ref 3.5–5.1)
Sodium: 137 mmol/L (ref 135–145)

## 2020-06-24 LAB — GLUCOSE, CAPILLARY
Glucose-Capillary: 96 mg/dL (ref 70–99)
Glucose-Capillary: 96 mg/dL (ref 70–99)
Glucose-Capillary: 134 mg/dL — ABNORMAL HIGH (ref 70–99)

## 2020-06-24 LAB — MAGNESIUM: Magnesium: 2.3 mg/dL (ref 1.7–2.4)

## 2020-06-24 MED ORDER — TORSEMIDE 100 MG PO TABS: 50.0000 mg | ORAL_TABLET | ORAL | Status: DC

## 2020-06-24 NOTE — Plan of Care (Signed)
  Problem: Activity: Goal: Risk for activity intolerance will decrease Outcome: Progressing   

## 2020-06-24 NOTE — Discharge Summary (Signed)
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 Fatigue   Brief narrative: Grace Bradford is a 75 y.o. female with PMH significant for DM2, HTN, chronic combined systolic and diastolic CHF (EF 35 to 54% 3/21), pulmonary hypertension, nonobstructive CAD, CKD 4, chronic anemia, hypothyroidism.  Lives at home with her husband, ambulates with a walker. 2/9, patient was seen by cardiologist, Grace Bradford was added for CHF.  2/23, seen by nephrologist, blood work showed creatinine elevated to 2.2 and calcium elevated to >11 and hence directed to ED.  In the ED, patient looked dehydrated but was hemodynamically stable. Labs showed creatinine elevated to 4.5, calcium elevated to 11.7 Admitted to hospitalist service. Heart failure consultation was obtained. See below for details.  Subjective: Patient was seen and examined this morning.   Sitting up at the edge of the bed.  Not in distress.  No new symptoms.  Not on submental oxygen.  Remains euvolemic. Creatinine gradually improving.  Hospital course: AKI on CKD 4 -Patient is in a CHF regimen which includes Entresto, torsemide and metolazone. On top of that Grace Bradford was recently added.  Patient probably started to have dehydration and prerenal AKI after that.  Patient denies taking any NSAIDs. -Baseline creatinine 1.36 from a year ago.  Presented with a creatinine of 4.53.  Renal ultrasound  unremarkable. With adjustment of medications and IV fluid, creatinine is gradually improving.  It is 3.23 this morning.  Patient will follow up as an outpatient. -Follows up outpatient with Dr. Hollie Salk. Recent Labs    07/11/19 1502 06/04/20 1530 06/19/20 1744 06/20/20 0512 06/20/20 0636 06/21/20 0238 06/22/20 0444 06/23/20 0253 06/24/20 0128  BUN 28* 51* 80*  --  74* 71* 62* 56* 54*  CREATININE 1.36* 2.94* 4.53* 4.47* 4.35* 3.96* 3.54* 3.48* 3.23*   Chronic combined systolic and diastolic CHF Essential hypertension Pulmonary hypertension -Last echo from 3/21 with EF 35 to 40%. -Home meds include Coreg 6.25 mg twice daily, Entresto 97/103 mg twice daily, Farxiga 10 mg daily, torsemide 50 mg daily, metolazone 2.5 mg daily as needed, amlodipine 10 mg daily, sildenafil 60 mg 3 times daily, Corlanor 7.5 mg twice daily. -Currently continued on Coreg, sildenafil and Corlanor.  Discharge with the same. -Currently on hold at amlodipine, Grace Bradford, Farxiga, torsemide and metolazone.  Torsemide can be taken as needed for shortness of breath and pedal edema. -Heart failure team to follow-up as an outpatient.  Hypokalemia/hypomagnesemia -Potassium and magnesium level improved. Recent Labs  Lab 06/20/20 0636 06/21/20 0238 06/22/20 0444 06/23/20 0253 06/24/20 0128  K 3.4* 3.8 3.7 3.9 4.1  MG  --  1.3*  --  1.6* 2.3  PHOS 3.9 3.0  --   --   --    Hypercalcemia -Calcium level was elevated to 11.7 on presentation probably due to dehydration. -Improved now. -In order to rule out multiple myeloma as other possible etiology of AKI and hypercalcemia, bone scan was done which is normal.  SPEP, UPEP level pending -CT  chest abdomen pelvis was obtained today which ruled out malignancy. Recent Labs  Lab 06/20/20 0636 06/21/20 0238 06/22/20 0444 06/23/20 0253 06/24/20 0128  CALCIUM 11.3* 10.2 9.9 9.8 9.8   Type 2 diabetes mellitus -A1c 6.1 on 2/25 -Home meds include Levemir 15 units daily,  Trulicity 1.5 mg subcu weekly on Thursday and recently added on Farxiga 10 mg daily primary for heart failure purpose. -Currently patient is not requiring any insulin and her blood sugar level has remained consistently remained less than 150.  At discharge, I would not initiate her on any antidiabetic medications or insulin.  With improvement in renal function, patient will gradually require reinitiation of insulin and other medications.  Stable continue to monitor blood sugar at home and resume medications.  I would recommend Levemir 5 units to begin with.  I have explained this in detail to the patient. Recent Labs  Lab 06/23/20 1112 06/23/20 1710 06/23/20 2140 06/24/20 0731 06/24/20 1115  GLUCAP 103* 105* 122* 96 96   History of CAD nonobstructive -Continue aspirin, Plavix and statin.  Chronic anemia -Stable hemoglobin -Continue PPI Recent Labs    06/20/20 0512 06/20/20 0636 06/21/20 0238 06/22/20 0444 06/23/20 0253  HGB 11.0* 10.3* 9.1* 9.4* 9.2*  MCV 86.9 84.6 84.0 82.5 84.3   Hypothyroidism -Continue Synthroid  History of sleep apnea  -on dental appliances.  Chronic right lower extremity pain  -She states it is related to sciatica. -Continue home medicine that include Skelaxin 3 times daily, Valium as needed, Lyrica twice daily and Voltaren gel.   Wound care:    Discharge Exam:   Vitals:   06/23/20 2108 06/24/20 0527 06/24/20 0532 06/24/20 0811  BP: (!) 141/47 (!) 121/42 (!) 121/42 (!) 131/57  Pulse: (!) 46 (!) 56 (!) 56 (!) 59  Resp:    18  Temp: (!) 97.4 F (36.3 C) 98.3 F (36.8 C) 98.3 F (36.8 C) 98.8 F (37.1 C)  TempSrc: Oral Oral Oral Oral  SpO2: 100% 95% 95% 95%  Weight:      Height:        Body mass index is 35.87 kg/m.  General exam: Pleasant elderly African-American female. Not in physical distress. Skin: No rashes, lesions or ulcers. HEENT: Atraumatic, normocephalic, no obvious bleeding Lungs: Clear to auscultation bilaterally CVS:  Regular rate and rhythm, no murmur GI/Abd soft, nontender, nondistended, bowel sound present CNS: Alert, awake, oriented x3 Psychiatry: Mood appropriate Extremities: No pedal edema, no calf tenderness  Follow ups:   Discharge Instructions    Diet - low sodium heart healthy   Complete by: As directed    Diet Carb Modified   Complete by: As directed    Increase activity slowly   Complete by: As directed       Follow-up Information    Bensimhon, Shaune Pascal, MD Follow up.   Specialty: Cardiology Contact information: [address] [place] [address] [phone]        Madelon Lips, MD Follow up.   Specialty: Nephrology Contact information: [address] [place] [address] [phone]               Recommendations for Outpatient Follow-Up:   1. Follow-up with PCP, nephrology and CHF team as an outpatient  Discharge Instructions:  Follow with Primary MD Grace Gravel, MD in 7 days   Get CBC/BMP checked in next visit within 1 week by PCP or SNF MD ( we routinely change or add medications that can affect your baseline labs  and fluid status, therefore we recommend that you get the mentioned basic workup next visit with your PCP, your PCP may decide not to get them or add new tests based on their clinical decision)  On your next visit with your PCP, please Get Medicines reviewed and adjusted.  Please request your PCP  to go over all Hospital Tests and Procedure/Radiological results at the follow up, please get all Hospital records sent to your Prim MD by signing hospital release before you go home.  Activity: As tolerated with Full fall precautions use walker/cane & assistance as needed  For Heart failure patients - Check your Weight same time everyday, if you gain over 2 pounds, or you develop in leg swelling, experience more shortness of breath or chest pain, call your Primary MD immediately. Follow Cardiac Low Salt Diet and 1.5 lit/day fluid  restriction.  If you have smoked or chewed Tobacco in the last 2 yrs please stop smoking, stop any regular Alcohol  and or any Recreational drug use.  If you experience worsening of your admission symptoms, develop shortness of breath, life threatening emergency, suicidal or homicidal thoughts you must seek medical attention immediately by calling 911 or calling your MD immediately  if symptoms less severe.  You Must read complete instructions/literature along with all the possible adverse reactions/side effects for all the Medicines you take and that have been prescribed to you. Take any new Medicines after you have completely understood and accpet all the possible adverse reactions/side effects.   Do not drive, operate heavy machinery, perform activities at heights, swimming or participation in water activities or provide baby sitting services if your were admitted for syncope or siezures until you have seen by Primary MD or a Neurologist and advised to do so again.  Do not drive when taking Pain medications.  Do not take more than prescribed Pain, Sleep and Anxiety Medications  Wear Seat belts while driving.   Please note You were cared for by a hospitalist during your hospital stay. If you have any questions about your discharge medications or the care you received while you were in the hospital after you are discharged, you can call the unit and asked to speak with the hospitalist on call if the hospitalist that took care of you is not available. Once you are discharged, your primary care physician will handle any further medical issues. Please note that NO REFILLS for any discharge medications will be authorized once you are discharged, as it is imperative that you return to your primary care physician (or establish a relationship with a primary care physician if you do not have one) for your aftercare needs so that they can reassess your need for medications and monitor your lab  values.    Allergies as of 06/24/2020      Reactions   Rocephin [ceftriaxone Sodium In Dextrose] Itching   Tolerated cefdinir 07/2017   Ace Inhibitors Cough      Medication List    STOP taking these medications   amLODipine 10 MG tablet Commonly known as: NORVASC   calcitRIOL 0.5 MCG capsule Commonly known as: ROCALTROL   CALCIUM 600 + D PO   dapagliflozin propanediol 10 MG Tabs tablet Commonly known as: Farxiga   Entresto 97-103 MG Generic drug: sacubitril-valsartan   insulin detemir 100 UNIT/ML FlexPen Commonly known as: LEVEMIR   metolazone 2.5 MG tablet Commonly known as: ZAROXOLYN   potassium chloride SA 20 MEQ tablet Commonly known as: KLOR-CON   Trulicity 1.5  MG/0.5ML Sopn Generic drug: Dulaglutide     TAKE these medications   acetaminophen 500 MG tablet Commonly known as: TYLENOL Take 1,000 mg by mouth every 6 (six) hours as needed for mild pain or moderate pain.   albuterol (2.5 MG/3ML) 0.083% nebulizer solution Commonly known as: PROVENTIL Inhale 3 mLs into the lungs every 4 (four) hours as needed for shortness of breath.   allopurinol 100 MG tablet Commonly known as: ZYLOPRIM Take 50 mg by mouth daily.   ARNICA EX Apply 1 application topically 2 (two) times daily as needed (pain).   aspirin EC 81 MG tablet Take 81 mg by mouth daily.   benzonatate 100 MG capsule Commonly known as: TESSALON Take 100 mg by mouth 3 (three) times daily as needed for cough.   carvedilol 6.25 MG tablet Commonly known as: COREG Take 1 tablet (6.25 mg total) by mouth 2 (two) times daily with a meal.   [place] 450 MG Caps Take 450 mg by mouth 2 (two) times daily.   cetirizine 10 MG tablet Commonly known as: ZYRTEC Take 10 mg by mouth daily.   clopidogrel 75 MG tablet Commonly known as: PLAVIX Take 75 mg by mouth daily.   diazepam 5 MG tablet Commonly known as: VALIUM Take 1 tablet (5 mg total) by mouth every 12 (twelve) hours as needed for muscle  spasms.   diclofenac Sodium 1 % Gel Commonly known as: VOLTAREN Apply 2 g topically 4 (four) times daily as needed (pain).   fluticasone 50 MCG/ACT nasal spray Commonly known as: FLONASE Place 2 sprays into both nostrils 2 (two) times daily as needed for allergies.   Ginger Root 500 MG Caps Take 500 mg by mouth daily.   guaiFENesin 600 MG 12 hr tablet Commonly known as: MUCINEX Take 2 tablets (1,200 mg total) by mouth 2 (two) times daily.   HYDROcodone-acetaminophen 10-325 MG tablet Commonly known as: NORCO Take 1 tablet by mouth every 6 (six) hours as needed for moderate pain.   hydroxypropyl methylcellulose / hypromellose 2.5 % ophthalmic solution Commonly known as: ISOPTO TEARS / GONIOVISC Place 1 drop into both eyes 3 (three) times daily as needed for dry eyes.   hydrOXYzine 25 MG tablet Commonly known as: ATARAX/VISTARIL Take 25 mg by mouth 4 (four) times daily.   ivabradine 7.5 MG Tabs tablet Commonly known as: Corlanor Take 1 tablet (7.5 mg total) by mouth 2 (two) times daily with a meal.   levothyroxine 100 MCG tablet Commonly known as: SYNTHROID Take 100 mcg by mouth See admin instructions. Pt takes Monday through Friday and does not take on the weekends   lidocaine 5 % ointment Commonly known as: XYLOCAINE Apply 1 application topically 2 (two) times daily as needed for pain.   metaxalone 800 MG tablet Commonly known as: SKELAXIN Take 800 mg by mouth 3 (three) times daily.   multivitamin with minerals Tabs tablet Take 1 tablet by mouth daily.   pantoprazole 40 MG tablet Commonly known as: PROTONIX Take 1 tablet (40 mg total) by mouth 2 (two) times daily.   pravastatin 80 MG tablet Commonly known as: PRAVACHOL Take 80 mg by mouth Daily.   pregabalin 75 MG capsule Commonly known as: LYRICA Take 75-150 mg by mouth See admin instructions. 75 mg in the am and 150 mg at bedtimes   PROBIOTIC PO Take 1 capsule by mouth daily.   sildenafil 20 MG  tablet Commonly known as: REVATIO TAKE 3 TABLETS (60 MG) BY MOUTH THREE TIMES  DAILY What changed: See the new instructions.   torsemide 100 MG tablet Commonly known as: DEMADEX Take 0.5 tablets (50 mg total) by mouth See admin instructions. Take 50 mg 1/2 tab  as needed for weight gain > 233 What changed: See the new instructions.       Time coordinating discharge: 35 minutes  The results of significant diagnostics from this hospitalization (including imaging, microbiology, ancillary and laboratory) are listed below for reference.    Procedures and Diagnostic Studies:   US RENAL  Result Date: 06/20/2020 CLINICAL DATA:  Acute renal failure EXAM: RENAL / URINARY TRACT ULTRASOUND COMPLETE COMPARISON:  CT 04/04/2019, right upper quadrant ultrasound 12/06/2014, renal ultrasound 04/28/2009 FINDINGS: Right Kidney: Renal measurements: 10.5 x 4.1 x 5.0 cm = volume: 112.6 mL. Mildly increased renal cortical echogenicity. No concerning renal mass, shadowing calculus or hydronephrosis. Left Kidney: Renal measurements: 9.9 x 4.7 x 3.7 = volume: 90.4 mL. Mildly increased renal cortical echogenicity. No concerning renal mass, shadowing calculus or hydronephrosis. Bladder: Appears normal for degree of bladder distention. Other: None. IMPRESSION: 1. Mildly increased renal cortical echogenicity, can be seen in the setting of medical renal disease. 2. Otherwise unremarkable urinary tract ultrasound. Electronically Signed   By: Lovena Le M.D.   On: 06/20/2020 04:53   DG Bone Survey Met  Result Date: 06/20/2020 CLINICAL DATA:  Myeloma. EXAM: METASTATIC BONE SURVEY COMPARISON:  April 04, 2019. FINDINGS: No lytic or sclerotic lesions are noted in the skull, rib cage, spine, pelvis or extremities. No acute fracture or dislocation is noted. Degenerative changes are seen involving the thoracic and lumbar spine as well as both knees. IMPRESSION: No evidence of lytic or sclerotic lesions. Electronically Signed    By: Marijo Conception M.D.   On: 06/20/2020 19:19     Labs:   Basic Metabolic Panel: Recent Labs  Lab 06/20/20 0636 06/21/20 0238 06/22/20 0444 06/23/20 0253 06/24/20 0128  NA 140 138 140 139 137  K 3.4* 3.8 3.7 3.9 4.1  CL 100 105 106 106 104  CO2 _0 GLUCOSE 101* 127* 130* 122* 124*  BUN 74* 71* 62* 56* 54*  CREATININE 4.35* 3.96* 3.54* 3.48* 3.23*  CALCIUM 11.3* 10.2 9.9 9.8 9.8  MG  --  1.3*  --  1.6* 2.3  PHOS 3.9 3.0  --   --   --    GFR Estimated Creatinine Clearance: 15.5 mL/min (A) (by C-G formula based on SCr of 3.23 mg/dL (H)). Liver Function Tests: Recent Labs  Lab 06/20/20 0636  AST 16  ALT 14  ALKPHOS 33*  BILITOT 0.6  PROT 6.5  ALBUMIN 3.2*   No results for input(s): LIPASE, AMYLASE in the last 168 hours. No results for input(s): AMMONIA in the last 168 hours. Coagulation profile No results for input(s): INR, PROTIME in the last 168 hours.  CBC: Recent Labs  Lab 06/20/20 0512 06/20/20 0636 06/21/20 0238 06/22/20 0444 06/23/20 0253  WBC 5.5 5.4 4.5 4.1 4.2  NEUTROABS  --  2.5 1.7 1.3* 1.5*  HGB 11.0* 10.3* 9.1* 9.4* 9.2*  HCT 35.2* 32.3* 28.8* 27.8* 29.1*  MCV 86.9 84.6 84.0 82.5 84.3  PLT 150 147* 140* 136* 142*   Cardiac Enzymes: Recent Labs  Lab 06/20/20 0636  CKTOTAL 85   BNP: Invalid input(s): POCBNP CBG: Recent Labs  Lab 06/23/20 1112 06/23/20 1710 06/23/20 2140 06/24/20 0731 06/24/20 1115  GLUCAP 103* 105* 122* 96 96   D-Dimer No results for input(s): DDIMER  in the last 72 hours. Hgb A1c No results for input(s): HGBA1C in the last 72 hours. Lipid Profile No results for input(s): CHOL, HDL, LDLCALC, TRIG, CHOLHDL, LDLDIRECT in the last 72 hours. Thyroid function studies No results for input(s): TSH, T4TOTAL, T3FREE, THYROIDAB in the last 72 hours.  Invalid input(s): FREET3 Anemia work up No results for input(s): VITAMINB12, FOLATE, FERRITIN, TIBC, IRON, RETICCTPCT in the last 72  hours. Microbiology Recent Results (from the past 240 hour(s))  SARS CORONAVIRUS 2 (TAT 6-24 HRS) Nasopharyngeal Nasopharyngeal Swab     Status: None   Collection Time: 06/19/20 11:54 PM   Specimen: Nasopharyngeal Swab  Result Value Ref Range Status   SARS Coronavirus 2 NEGATIVE NEGATIVE Final    Comment: (NOTE) SARS-CoV-2 target nucleic acids are NOT DETECTED.  The SARS-CoV-2 RNA is generally detectable in upper and lower respiratory specimens during the acute phase of infection. Negative results do not preclude SARS-CoV-2 infection, do not rule out co-infections with other pathogens, and should not be used as the sole basis for treatment or other patient management decisions. Negative results must be combined with clinical observations, patient history, and epidemiological information. The expected result is Negative.  Fact Sheet for Patients: SugarRoll.be  Fact Sheet for Healthcare Providers: https://www.woods-mathews.com/  This test is not yet approved or cleared by the [place] FDA and  has been authorized for detection and/or diagnosis of SARS-CoV-2 by FDA under an Emergency Use Authorization (EUA). This EUA will remain  in effect (meaning this test can be used) for the duration of the COVID-19 declaration under Se ction 564(b)(1) of the Act, 21 U.S.C. section 360bbb-3(b)(1), unless the authorization is terminated or revoked sooner.  Performed at [facility], [place] [address], [place], [place] [address]      Signed: Terrilee Croak  [facility] 06/24/2020, 4:21 PM

## 2020-06-24 NOTE — Progress Notes (Signed)
Advanced Heart Failure Rounding Note  PCP-Cardiologist: No primary care provider on file.   Subjective:    Scr steadily improving. 4.53 on admit, down to 3.23 today. Baseline ~ 2.0   Ca levels down after discontinuation of ca supplements. Remains at 9.8 today  Bone survey showed no evidence of lytic or sclerotic lesions. Multiple Myeloma still in process. Light chains elevated but looks polyclonal. SPEP/UPEP still pending (we called [facility])   Weight stable. Denies SOB, orthopnea or PND    Objective:   Weight Range: 86.1 kg Body mass index is 35.87 kg/m.   Vital Signs:   Temp:  [97.4 F (36.3 C)-98.8 F (37.1 C)] 98.8 F (37.1 C) (03/01 0811) Pulse Rate:  [46-59] 59 (03/01 0811) Resp:  [18] 18 (03/01 0811) BP: (121-144)/(42-57) 131/57 (03/01 0811) SpO2:  [95 %-100 %] 95 % (03/01 0811) Last BM Date: 06/22/20  Weight change: Filed Weights   06/20/20 1400 06/22/20 0500 06/23/20 0813  Weight: 83 kg 86.9 kg 86.1 kg    Intake/Output:   Intake/Output Summary (Last 24 hours) at 06/24/2020 0835 Last data filed at 06/23/2020 2201 Gross per 24 hour  Intake 1040.47 ml  Output -  Net 1040.47 ml      Physical Exam    General:  Well appearing. No resp difficulty HEENT: normal Neck: supple. no JVD. Carotids 2+ bilat; no bruits. No lymphadenopathy or thryomegaly appreciated. Cor: PMI nondisplaced. Regular rate & rhythm. No rubs, gallops or murmurs. Lungs: clear Abdomen: soft, nontender, nondistended. No hepatosplenomegaly. No bruits or masses. Good bowel sounds. Extremities: no cyanosis, clubbing, rash, edema Neuro: alert & orientedx3, cranial nerves grossly intact. moves all 4 extremities w/o difficulty. Affect pleasant  Telemetry   NSR 50-60s Personally reviewed   Labs    CBC Recent Labs    06/22/20 0444 06/23/20 0253  WBC 4.1 4.2  NEUTROABS 1.3* 1.5*  HGB 9.4* 9.2*  HCT 27.8* 29.1*  MCV 82.5 84.3  PLT 136* 469*   Basic Metabolic Panel Recent Labs     06/23/20 0253 06/24/20 0128  NA 139 137  K 3.9 4.1  CL 106 104  CO2 24 24  GLUCOSE 122* 124*  BUN 56* 54*  CREATININE 3.48* 3.23*  CALCIUM 9.8 9.8  MG 1.6* 2.3   Liver Function Tests No results for input(s): AST, ALT, ALKPHOS, BILITOT, PROT, ALBUMIN in the last 72 hours. No results for input(s): LIPASE, AMYLASE in the last 72 hours. Cardiac Enzymes No results for input(s): CKTOTAL, CKMB, CKMBINDEX, TROPONINI in the last 72 hours.  BNP: BNP (last 3 results) Recent Labs    06/04/20 1530  BNP 202.4*    ProBNP (last 3 results) No results for input(s): PROBNP in the last 8760 hours.   D-Dimer No results for input(s): DDIMER in the last 72 hours. Hemoglobin A1C No results for input(s): HGBA1C in the last 72 hours. Fasting Lipid Panel No results for input(s): CHOL, HDL, LDLCALC, TRIG, CHOLHDL, LDLDIRECT in the last 72 hours. Thyroid Function Tests No results for input(s): TSH, T4TOTAL, T3FREE, THYROIDAB in the last 72 hours.  Invalid input(s): FREET3  Other results:   Imaging    No results found.   Medications:     Scheduled Medications: . allopurinol  50 mg Oral Daily  . aspirin EC  81 mg Oral Daily  . carvedilol  6.25 mg Oral BID WC  . clopidogrel  75 mg Oral Daily  . diclofenac Sodium  2 g Topical QID  . fluticasone  2  spray Each Nare Daily  . heparin  5,000 Units Subcutaneous Q8H  . hydrOXYzine  50 mg Oral BID  . insulin aspart  0-9 Units Subcutaneous TID WC  . ivabradine  7.5 mg Oral BID WC  . levothyroxine  100 mcg Oral Once per day on Mon Tue Wed Thu Fri  . pantoprazole  40 mg Oral BID  . pravastatin  80 mg Oral q1800  . pregabalin  150 mg Oral QHS  . pregabalin  75 mg Oral Daily  . sildenafil  60 mg Oral TID    Infusions:   PRN Medications: acetaminophen **OR** acetaminophen, diazepam, guaiFENesin-dextromethorphan, hydrALAZINE, HYDROcodone-acetaminophen    Assessment/Plan    1. 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 More recently creatinine running 1.4-1.6 (GFR 35-45) 11/21 (after 50 pound weight loss) - Recently started on an SGLT2i (Farxiga) on 2/9 - SCr 2.94>>4.53 on admit Now 3.23 - treated w/ IVF hydration. - renal US shows mildly increased renal cortical cortical echogenicity but otherwise unremarkable.   - Recent weight loss, AKI and hypercalcemia raised concern for possible myeloma. - Multiple Myeloma panel, kappa/lambda light chains pending but bone survey w/ no evidence of lytic or sclerotic lesions. Light chains show polyclonal increase and likely not MM. Awaiting SPEP. (We called [facility] today). Overall MM felt less likely  - Suspect pre-renal AKI from overdiuresis  - She will stay off Parker to hold Entresto and torsemide for now - Avoid hypotension  - Will check CT C/A/P (no contrast) today. If no malignancy can likely go home with close f/u   2. Hypercalcemia  - Ca 11 on admit (down from 14 on recent outpatient labs) - Calcitrol and calcium replacement has been discontinued  - levels now normalized, 9.8  - bone survey w/ no evidence of lytic or sclerotic lesions.  - no change   3. Chronic Systolic Heart Failure NICM,EF 30% (10/2014). -->Echo 06/2016, EF 45-50% - Echo 4/19 EF 40-45%  - Echo3/21EF35-40% moderate RV dysfunction - Volume status ok currently on exam.  - Continue to hold Entresto and Torsemide w/ AKI  - ok to continue Coreg and Sildenafil (for RV dysjunction) - she will stay off Farxiga, Entresto and torsemide on d/c. Can take torsemide prn for weight gain - no Arlyce Harman nor dig given renal dysfunction   4. Type 2DM  - Controlled. Hgb A1c 6.1  - SSI  - Wilder Glade has been discontinued   5. Anemia - chronic,  Hgb 9.2 - ? Anemia of chronic disease from CKD   6. Hypokalemia  - Resolved, K 3.9 today.   Length of Stay: [place], MD  06/24/2020, 8:35 AM  Advanced Heart Failure Team Pager [phone] (M-F; 7a - 4p)  Please  contact [facility] for night-coverage after hours (4p -7a ) and weekends on [email or web address]

## 2020-06-24 NOTE — Progress Notes (Signed)
DISCHARGE NOTE HOME Grace Bradford to be discharged Home per MD order. Discussed prescriptions and follow up appointments with the patient. Prescriptions given to patient; medication list explained in detail. Patient verbalized understanding.  Skin clean, dry and intact without evidence of skin break down, no evidence of skin tears noted. IV catheter discontinued intact. Site without signs and symptoms of complications. Dressing and pressure applied. Pt denies pain at the site currently. No complaints noted.  Patient free of lines, drains, and wounds.   An After Visit Summary (AVS) was printed and given to the patient. Patient escorted via wheelchair, and discharged home via private auto.  Arlyss Repress, RN

## 2020-06-24 NOTE — Progress Notes (Signed)
PROGRESS NOTE  [name]  DOB: Oct 09, 1945  PCP: Jani Gravel, MD YTK:[id number]  DOA: 06/19/2020  LOS: 4 days   Chief Complaint  Patient presents with  . Fatigue   Brief narrative: MITA VALLO is a 75 y.o. female with PMH significant for DM2, HTN, chronic combined systolic and diastolic CHF (EF 35 to 75% 3/21), pulmonary hypertension, nonobstructive CAD, CKD 4, chronic anemia, hypothyroidism.  Lives at home with her husband, ambulates with a walker. 2/9, patient was seen by cardiologist, Wilder Glade was added for CHF.  2/23, seen by nephrologist, blood work showed creatinine elevated to 2.2 and calcium elevated to >11 and hence directed to ED.  In the ED, patient looked dehydrated but was hemodynamically stable. Labs showed creatinine elevated to 4.5, calcium elevated to 11.7 Admitted to hospitalist service. Heart failure consultation was obtained. See below for details.  Subjective: Patient was seen and examined this morning.   Sitting up at the edge of the bed.  Not in distress.  No new symptoms.  Not on submental oxygen.  Remains euvolemic. Creatinine gradually improving  Assessment/Plan: AKI on CKD 4 -Patient is in a CHF regimen which includes Entresto, torsemide and metolazone. On top of that Wilder Glade was recently added.  Patient probably started to have dehydration and prerenal AKI after that.  Patient denies taking any NSAIDs. -Baseline creatinine 1.36 from a year ago.  Presented with a creatinine of 4.53.  With adjustment of medications and IV fluid, creatinine improved to 3.23 this morning.  Continue to monitor. -Renal ultrasound unremarkable. -Follows up outpatient with Dr. Hollie Salk. Recent Labs    07/11/19 1502 06/04/20 1530 06/19/20 1744 06/20/20 0512 06/20/20 0636 06/21/20 0238 06/22/20 0444 06/23/20 0253 06/24/20 0128  BUN 28* 51* 80*  --  74* 71* 62* 56* 54*  CREATININE 1.36* 2.94* 4.53* 4.47* 4.35* 3.96* 3.54* 3.48* 3.23*   Chronic combined systolic and  diastolic CHF Essential hypertension Pulmonary hypertension -Last echo from 3/21 with EF 35 to 40%. -Home meds include Coreg 6.25 mg twice daily, Entresto 97/103 mg twice daily, Farxiga 10 mg daily, torsemide 50 mg daily, metolazone 2.5 mg daily as needed, amlodipine 10 mg daily, sildenafil 60 mg 3 times daily, Corlanor 7.5 mg twice daily. -Continue Coreg, sildenafil and Corlanor. -Continue to hold amlodipine, Delene Loll, Farxiga, torsemide and metolazone. -Heart failure team following. -Monitor electrolytes Recent Labs  Lab 06/20/20 0636 06/21/20 0238 06/22/20 0444 06/23/20 0253 06/24/20 0128  K 3.4* 3.8 3.7 3.9 4.1  MG  --  1.3*  --  1.6* 2.3  PHOS 3.9 3.0  --   --   --    Hypercalcemia -Calcium level was elevated to 11.7 on presentation probably due to dehydration. -Improved now. -In order to rule out multiple myeloma as other possible etiology of AKI and hypercalcemia, bone scan was done which is normal.  SPEP, UPEP level pending -CT chest abdomen pelvis has been ordered to rule out malignancy. Recent Labs  Lab 06/20/20 0636 06/21/20 0238 06/22/20 0444 06/23/20 0253 06/24/20 0128  CALCIUM 11.3* 10.2 9.9 9.8 9.8   Hypokalemia/hypomagnesemia -Potassium level improved.  Magnesium level low at 1.3 today.  Will order for replacement. Repeat level tomorrow.  Type 2 diabetes mellitus -A1c 6.1 on 2/25 -Home meds include Levemir 15 units daily, Trulicity 1.5 mg subcu weekly on Thursday and recently added on Farxiga 10 mg daily primary for heart failure purpose. -Currently patient is not requiring any insulin and her blood sugar level has remained consistently remained less than 150.  Continue sliding scale insulin with Accu-Cheks. Recent Labs  Lab 06/23/20 0649 06/23/20 1112 06/23/20 1710 06/23/20 2140 06/24/20 0731  GLUCAP 83 103* 105* 122* 96   History of CAD nonobstructive -Continue aspirin, Plavix and statin.  Chronic anemia -Stable hemoglobin -Continue PPI Recent  Labs    06/20/20 0512 06/20/20 0636 06/21/20 0238 06/22/20 0444 06/23/20 0253  HGB 11.0* 10.3* 9.1* 9.4* 9.2*  MCV 86.9 84.6 84.0 82.5 84.3   Hypothyroidism -Continue Synthroid  History of sleep apnea  -on dental appliances.  Chronic right lower extremity pain  -She states it is related to sciatica. -Continue home medicine that include Skelaxin 3 times daily, Valium as needed, Lyrica twice daily and Voltaren gel.  Mobility: PT eval obtained.  Home with PT recommended Code Status:   Code Status: Full Code  Nutritional status: Body mass index is 35.87 kg/m.     Diet Order            Diet heart healthy/carb modified Room service appropriate? Yes; Fluid consistency: Thin; Fluid restriction: 1200 mL Fluid  Diet effective now                 DVT prophylaxis: heparin injection 5,000 Units Start: 06/20/20 0600   Antimicrobials:  None Fluid: None IV fluid Consultants: CHF Family Communication:  None at bedside  Status is: Inpatient  Remains inpatient appropriate because: Renal function is still not back to baseline, needs further optimization of heart failure medications   Dispo: The patient is from: Home              Anticipated d/c is to: Home with home health PT.  Today or tomorrow.  Depends on CT scan.              Patient currently is not medically stable to d/c.   Difficult to place patient No  Infusions:    Scheduled Meds: . allopurinol  50 mg Oral Daily  . aspirin EC  81 mg Oral Daily  . carvedilol  6.25 mg Oral BID WC  . clopidogrel  75 mg Oral Daily  . diclofenac Sodium  2 g Topical QID  . fluticasone  2 spray Each Nare Daily  . heparin  5,000 Units Subcutaneous Q8H  . hydrOXYzine  50 mg Oral BID  . insulin aspart  0-9 Units Subcutaneous TID WC  . ivabradine  7.5 mg Oral BID WC  . levothyroxine  100 mcg Oral Once per day on Mon Tue Wed Thu Fri  . pantoprazole  40 mg Oral BID  . pravastatin  80 mg Oral q1800  . pregabalin  150 mg Oral QHS  .  pregabalin  75 mg Oral Daily  . sildenafil  60 mg Oral TID    Antimicrobials: Anti-infectives (From admission, onward)   None      PRN meds: acetaminophen **OR** acetaminophen, diazepam, guaiFENesin-dextromethorphan, hydrALAZINE, HYDROcodone-acetaminophen   Objective: Vitals:   06/24/20 0532 06/24/20 0811  BP: (!) 121/42 (!) 131/57  Pulse: (!) 56 (!) 59  Resp:  18  Temp: 98.3 F (36.8 C) 98.8 F (37.1 C)  SpO2: 95% 95%    Intake/Output Summary (Last 24 hours) at 06/24/2020 1015 Last data filed at 06/23/2020 2201 Gross per 24 hour  Intake 1040.47 ml  Output --  Net 1040.47 ml   Filed Weights   06/20/20 1400 06/22/20 0500 06/23/20 0813  Weight: 83 kg 86.9 kg 86.1 kg   Weight change:  Body mass index is 35.87 kg/m.   Physical Exam:  General exam: Pleasant elderly African-American female. Not in physical distress. Skin: No rashes, lesions or ulcers. HEENT: Atraumatic, normocephalic, no obvious bleeding Lungs: Clear to auscultation bilaterally CVS: Regular rate and rhythm, no murmur GI/Abd soft, nontender, nondistended, bowel sound present CNS: Alert, awake, oriented x3 Psychiatry: Mood appropriate Extremities: No pedal edema, no calf tenderness  Data Review: I have personally reviewed the laboratory data and studies available.  Recent Labs  Lab 06/20/20 0512 06/20/20 0636 06/21/20 0238 06/22/20 0444 06/23/20 0253  WBC 5.5 5.4 4.5 4.1 4.2  NEUTROABS  --  2.5 1.7 1.3* 1.5*  HGB 11.0* 10.3* 9.1* 9.4* 9.2*  HCT 35.2* 32.3* 28.8* 27.8* 29.1*  MCV 86.9 84.6 84.0 82.5 84.3  PLT 150 147* 140* 136* 142*   Recent Labs  Lab 06/20/20 0636 06/21/20 0238 06/22/20 0444 06/23/20 0253 06/24/20 0128  NA 140 138 140 139 137  K 3.4* 3.8 3.7 3.9 4.1  CL 100 105 106 106 104  CO2 26 23 25 24 24   GLUCOSE 101* 127* 130* 122* 124*  BUN 74* 71* 62* 56* 54*  CREATININE 4.35* 3.96* 3.54* 3.48* 3.23*  CALCIUM 11.3* 10.2 9.9 9.8 9.8  MG  --  1.3*  --  1.6* 2.3  PHOS 3.9  3.0  --   --   --     F/u labs ordered Unresulted Labs (From admission, onward)          Start     Ordered   06/25/20 0500  CBC with Differential/Platelet  Tomorrow morning,   R       Question:  Specimen collection method  Answer:  Lab=Lab collect   06/24/20 1014   06/25/20 5520  Basic metabolic panel  Tomorrow morning,   R       Question:  Specimen collection method  Answer:  Lab=Lab collect   06/24/20 1014   06/21/20 0500  Multiple Myeloma Panel (SPEP&IFE w/QIG)  Once,   R        06/21/20 0500   06/20/20 0011  Urinalysis, Routine w reflex microscopic Nasopharyngeal Swab  ONCE - STAT,   STAT        06/20/20 0010          Signed, Terrilee Croak, MD [facility] 06/24/2020

## 2020-06-25 LAB — MULTIPLE MYELOMA PANEL, SERUM
Albumin SerPl Elph-Mcnc: 2.8 g/dL — ABNORMAL LOW (ref 2.9–4.4)
Albumin/Glob SerPl: 1.2 (ref 0.7–1.7)
Alpha 1: 0.2 g/dL (ref 0.0–0.4)
Alpha2 Glob SerPl Elph-Mcnc: 0.6 g/dL (ref 0.4–1.0)
B-Globulin SerPl Elph-Mcnc: 0.7 g/dL (ref 0.7–1.3)
Gamma Glob SerPl Elph-Mcnc: 0.9 g/dL (ref 0.4–1.8)
Globulin, Total: 2.4 g/dL (ref 2.2–3.9)
IgA: 243 mg/dL (ref 64–422)
IgG (Immunoglobin G), Serum: 910 mg/dL (ref 586–1602)
IgM (Immunoglobulin M), Srm: 81 mg/dL (ref 26–217)
Total Protein ELP: 5.2 g/dL — ABNORMAL LOW (ref 6.0–8.5)

## 2020-07-02 DIAGNOSIS — I129 Hypertensive chronic kidney disease with stage 1 through stage 4 chronic kidney disease, or unspecified chronic kidney disease: Secondary | ICD-10-CM | POA: Diagnosis not present

## 2020-07-02 DIAGNOSIS — N1831 Chronic kidney disease, stage 3a: Secondary | ICD-10-CM | POA: Diagnosis not present

## 2020-07-02 DIAGNOSIS — I2721 Secondary pulmonary arterial hypertension: Secondary | ICD-10-CM | POA: Diagnosis not present

## 2020-07-02 DIAGNOSIS — I5042 Chronic combined systolic (congestive) and diastolic (congestive) heart failure: Secondary | ICD-10-CM | POA: Diagnosis not present

## 2020-07-02 DIAGNOSIS — E1122 Type 2 diabetes mellitus with diabetic chronic kidney disease: Secondary | ICD-10-CM | POA: Diagnosis not present

## 2020-07-02 DIAGNOSIS — D631 Anemia in chronic kidney disease: Secondary | ICD-10-CM | POA: Diagnosis not present

## 2020-07-03 ENCOUNTER — Encounter (HOSPITAL_COMMUNITY): Payer: Self-pay

## 2020-07-03 ENCOUNTER — Other Ambulatory Visit: Payer: Self-pay

## 2020-07-03 ENCOUNTER — Ambulatory Visit (HOSPITAL_COMMUNITY)
Admission: RE | Admit: 2020-07-03 | Discharge: 2020-07-03 | Disposition: A | Discharge status: Home / Self Care | Payer: Medicare Other | Source: Ambulatory Visit | Attending: Family Medicine | Admitting: Family Medicine

## 2020-07-03 VITALS — BP 156/98 | HR 57 | Wt 193.6 lb

## 2020-07-03 DIAGNOSIS — Z79899 Other long term (current) drug therapy: Secondary | ICD-10-CM | POA: Diagnosis not present

## 2020-07-03 DIAGNOSIS — I252 Old myocardial infarction: Secondary | ICD-10-CM | POA: Insufficient documentation

## 2020-07-03 DIAGNOSIS — E785 Hyperlipidemia, unspecified: Secondary | ICD-10-CM | POA: Diagnosis not present

## 2020-07-03 DIAGNOSIS — Z881 Allergy status to other antibiotic agents status: Secondary | ICD-10-CM | POA: Insufficient documentation

## 2020-07-03 DIAGNOSIS — N184 Chronic kidney disease, stage 4 (severe): Secondary | ICD-10-CM | POA: Diagnosis not present

## 2020-07-03 DIAGNOSIS — I2510 Atherosclerotic heart disease of native coronary artery without angina pectoris: Secondary | ICD-10-CM | POA: Insufficient documentation

## 2020-07-03 DIAGNOSIS — N179 Acute kidney failure, unspecified: Secondary | ICD-10-CM

## 2020-07-03 DIAGNOSIS — I255 Ischemic cardiomyopathy: Secondary | ICD-10-CM | POA: Insufficient documentation

## 2020-07-03 DIAGNOSIS — I5032 Chronic diastolic (congestive) heart failure: Secondary | ICD-10-CM | POA: Diagnosis not present

## 2020-07-03 DIAGNOSIS — I130 Hypertensive heart and chronic kidney disease with heart failure and stage 1 through stage 4 chronic kidney disease, or unspecified chronic kidney disease: Secondary | ICD-10-CM | POA: Insufficient documentation

## 2020-07-03 DIAGNOSIS — E1122 Type 2 diabetes mellitus with diabetic chronic kidney disease: Secondary | ICD-10-CM | POA: Insufficient documentation

## 2020-07-03 DIAGNOSIS — Z7902 Long term (current) use of antithrombotics/antiplatelets: Secondary | ICD-10-CM | POA: Diagnosis not present

## 2020-07-03 DIAGNOSIS — Z79890 Hormone replacement therapy (postmenopausal): Secondary | ICD-10-CM | POA: Diagnosis not present

## 2020-07-03 DIAGNOSIS — Z8673 Personal history of transient ischemic attack (TIA), and cerebral infarction without residual deficits: Secondary | ICD-10-CM | POA: Insufficient documentation

## 2020-07-03 DIAGNOSIS — N189 Chronic kidney disease, unspecified: Secondary | ICD-10-CM | POA: Insufficient documentation

## 2020-07-03 DIAGNOSIS — Z7982 Long term (current) use of aspirin: Secondary | ICD-10-CM | POA: Insufficient documentation

## 2020-07-03 MED ORDER — DAPAGLIFLOZIN PROPANEDIOL 5 MG PO TABS: 5.0000 mg | ORAL_TABLET | Freq: Every day | ORAL | 3 refills | Status: DC

## 2020-07-03 NOTE — Patient Instructions (Signed)
Start Farxiga 5 mg, one tab daily  Labs needed in 7-10 days  Your physician recommends that you schedule a follow-up appointment in: 4-6 weeks  in the [facility]
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
## 2020-07-06 ENCOUNTER — Other Ambulatory Visit (HOSPITAL_COMMUNITY): Payer: Self-pay | Admitting: Internal Medicine

## 2020-07-07 DIAGNOSIS — G894 Chronic pain syndrome: Secondary | ICD-10-CM | POA: Diagnosis not present

## 2020-07-07 DIAGNOSIS — M47816 Spondylosis without myelopathy or radiculopathy, lumbar region: Secondary | ICD-10-CM | POA: Diagnosis not present

## 2020-07-07 DIAGNOSIS — M5126 Other intervertebral disc displacement, lumbar region: Secondary | ICD-10-CM | POA: Diagnosis not present

## 2020-07-07 DIAGNOSIS — E039 Hypothyroidism, unspecified: Secondary | ICD-10-CM | POA: Diagnosis not present

## 2020-07-07 DIAGNOSIS — M5136 Other intervertebral disc degeneration, lumbar region: Secondary | ICD-10-CM | POA: Diagnosis not present

## 2020-07-07 DIAGNOSIS — E118 Type 2 diabetes mellitus with unspecified complications: Secondary | ICD-10-CM | POA: Diagnosis not present

## 2020-07-07 DIAGNOSIS — E7800 Pure hypercholesterolemia, unspecified: Secondary | ICD-10-CM | POA: Diagnosis not present

## 2020-07-15 ENCOUNTER — Other Ambulatory Visit: Payer: Self-pay

## 2020-07-15 ENCOUNTER — Ambulatory Visit (HOSPITAL_COMMUNITY)
Admission: RE | Admit: 2020-07-15 | Discharge: 2020-07-15 | Disposition: A | Discharge status: Home / Self Care | Payer: Medicare Other | Source: Ambulatory Visit | Attending: Internal Medicine | Admitting: Internal Medicine

## 2020-07-15 DIAGNOSIS — I5032 Chronic diastolic (congestive) heart failure: Secondary | ICD-10-CM | POA: Diagnosis not present

## 2020-07-15 LAB — BASIC METABOLIC PANEL
Anion gap: 10 (ref 5–15)
BUN: 24 mg/dL — ABNORMAL HIGH (ref 8–23)
CO2: 22 mmol/L (ref 22–32)
Calcium: 7.6 mg/dL — ABNORMAL LOW (ref 8.9–10.3)
Chloride: 108 mmol/L (ref 98–111)
Creatinine, Ser: 1.92 mg/dL — ABNORMAL HIGH (ref 0.44–1.00)
GFR, Estimated: 27 mL/min — ABNORMAL LOW (ref >60)
Glucose, Bld: 123 mg/dL — ABNORMAL HIGH (ref 70–99)
Potassium: 3.6 mmol/L (ref 3.5–5.1)
Sodium: 140 mmol/L (ref 135–145)

## 2020-07-21 DIAGNOSIS — R059 Cough, unspecified: Secondary | ICD-10-CM | POA: Diagnosis not present

## 2020-07-21 DIAGNOSIS — I5032 Chronic diastolic (congestive) heart failure: Secondary | ICD-10-CM | POA: Diagnosis not present

## 2020-07-29 ENCOUNTER — Ambulatory Visit (HOSPITAL_COMMUNITY)
Admission: RE | Admit: 2020-07-29 | Discharge: 2020-07-29 | Disposition: A | Discharge status: Home / Self Care | Payer: Medicare Other | Source: Ambulatory Visit | Attending: Internal Medicine | Admitting: Internal Medicine

## 2020-07-29 DIAGNOSIS — I059 Rheumatic mitral valve disease, unspecified: Secondary | ICD-10-CM | POA: Diagnosis not present

## 2020-07-29 DIAGNOSIS — E119 Type 2 diabetes mellitus without complications: Secondary | ICD-10-CM | POA: Insufficient documentation

## 2020-07-29 DIAGNOSIS — I110 Hypertensive heart disease with heart failure: Secondary | ICD-10-CM | POA: Diagnosis not present

## 2020-07-29 DIAGNOSIS — K766 Portal hypertension: Secondary | ICD-10-CM | POA: Diagnosis not present

## 2020-07-29 DIAGNOSIS — I509 Heart failure, unspecified: Secondary | ICD-10-CM | POA: Insufficient documentation

## 2020-07-29 DIAGNOSIS — I429 Cardiomyopathy, unspecified: Secondary | ICD-10-CM | POA: Diagnosis not present

## 2020-07-29 DIAGNOSIS — I2721 Secondary pulmonary arterial hypertension: Secondary | ICD-10-CM | POA: Insufficient documentation

## 2020-07-29 DIAGNOSIS — I252 Old myocardial infarction: Secondary | ICD-10-CM | POA: Diagnosis not present

## 2020-07-29 DIAGNOSIS — Z8673 Personal history of transient ischemic attack (TIA), and cerebral infarction without residual deficits: Secondary | ICD-10-CM | POA: Diagnosis not present

## 2020-07-29 LAB — ECHOCARDIOGRAM COMPLETE
Area-P 1/2: 4.39 cm2
S' Lateral: 4.5 cm
Single Plane A4C EF: 53.4 %

## 2020-07-29 NOTE — Progress Notes (Signed)
  Echocardiogram 2D Echocardiogram has been performed.  Grace Bradford 07/29/2020, 3:59 PM

## 2020-08-05 DIAGNOSIS — M79604 Pain in right leg: Secondary | ICD-10-CM | POA: Diagnosis not present

## 2020-08-05 DIAGNOSIS — G894 Chronic pain syndrome: Secondary | ICD-10-CM | POA: Diagnosis not present

## 2020-08-05 DIAGNOSIS — M5416 Radiculopathy, lumbar region: Secondary | ICD-10-CM | POA: Diagnosis not present

## 2020-08-05 DIAGNOSIS — M5126 Other intervertebral disc displacement, lumbar region: Secondary | ICD-10-CM | POA: Diagnosis not present

## 2020-08-12 ENCOUNTER — Encounter (HOSPITAL_COMMUNITY): Payer: Self-pay

## 2020-08-12 ENCOUNTER — Ambulatory Visit (HOSPITAL_COMMUNITY)
Admission: RE | Admit: 2020-08-12 | Discharge: 2020-08-12 | Disposition: A | Discharge status: Home / Self Care | Payer: Medicare Other | Source: Ambulatory Visit | Attending: Adult Health | Admitting: Adult Health

## 2020-08-12 ENCOUNTER — Other Ambulatory Visit: Payer: Self-pay

## 2020-08-12 VITALS — BP 162/78 | HR 61 | Wt 192.6 lb

## 2020-08-12 DIAGNOSIS — E669 Obesity, unspecified: Secondary | ICD-10-CM | POA: Diagnosis not present

## 2020-08-12 DIAGNOSIS — I252 Old myocardial infarction: Secondary | ICD-10-CM | POA: Diagnosis not present

## 2020-08-12 DIAGNOSIS — I2510 Atherosclerotic heart disease of native coronary artery without angina pectoris: Secondary | ICD-10-CM | POA: Insufficient documentation

## 2020-08-12 DIAGNOSIS — I5042 Chronic combined systolic (congestive) and diastolic (congestive) heart failure: Secondary | ICD-10-CM | POA: Insufficient documentation

## 2020-08-12 DIAGNOSIS — Z7982 Long term (current) use of aspirin: Secondary | ICD-10-CM | POA: Diagnosis not present

## 2020-08-12 DIAGNOSIS — I130 Hypertensive heart and chronic kidney disease with heart failure and stage 1 through stage 4 chronic kidney disease, or unspecified chronic kidney disease: Secondary | ICD-10-CM | POA: Insufficient documentation

## 2020-08-12 DIAGNOSIS — I2721 Secondary pulmonary arterial hypertension: Secondary | ICD-10-CM

## 2020-08-12 DIAGNOSIS — Z79899 Other long term (current) drug therapy: Secondary | ICD-10-CM | POA: Insufficient documentation

## 2020-08-12 DIAGNOSIS — I5022 Chronic systolic (congestive) heart failure: Secondary | ICD-10-CM

## 2020-08-12 DIAGNOSIS — N184 Chronic kidney disease, stage 4 (severe): Secondary | ICD-10-CM

## 2020-08-12 DIAGNOSIS — E1122 Type 2 diabetes mellitus with diabetic chronic kidney disease: Secondary | ICD-10-CM | POA: Insufficient documentation

## 2020-08-12 DIAGNOSIS — I428 Other cardiomyopathies: Secondary | ICD-10-CM | POA: Insufficient documentation

## 2020-08-12 DIAGNOSIS — I5032 Chronic diastolic (congestive) heart failure: Secondary | ICD-10-CM | POA: Diagnosis not present

## 2020-08-12 DIAGNOSIS — K766 Portal hypertension: Secondary | ICD-10-CM | POA: Diagnosis not present

## 2020-08-12 DIAGNOSIS — Z7902 Long term (current) use of antithrombotics/antiplatelets: Secondary | ICD-10-CM | POA: Diagnosis not present

## 2020-08-12 DIAGNOSIS — Z7984 Long term (current) use of oral hypoglycemic drugs: Secondary | ICD-10-CM | POA: Insufficient documentation

## 2020-08-12 MED ORDER — BENZONATATE 200 MG PO CAPS: 200.0000 mg | ORAL_CAPSULE | Freq: Three times a day (TID) | ORAL | 0 refills | Status: DC | PRN

## 2020-08-12 MED ORDER — TORSEMIDE 100 MG PO TABS: 50.0000 mg | ORAL_TABLET | ORAL | 3 refills | Status: DC

## 2020-08-12 MED ORDER — HYDROXYZINE HCL 25 MG PO TABS: 25.0000 mg | ORAL_TABLET | Freq: Four times a day (QID) | ORAL | 0 refills | Status: DC
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
## 2020-08-12 NOTE — Patient Instructions (Signed)
CHANGE Torsemide 50 mg, one half tab twice a week or as needed for weight gain/swelling  Your physician recommends that you schedule a follow-up appointment in: 4-6 weeks with Dr Haroldine Laws  Do the following things EVERYDAY: 1) Weigh yourself in the morning before breakfast. Write it down and keep it in a log. 2) Take your medicines as prescribed 3) Eat low salt foods--Limit salt (sodium) to 2000 mg per day.  4) Stay as active as you can everyday 5) Limit all fluids for the day to less than 2 liters   At the [facility], you and your health needs are our priority. As part of our continuing mission to provide you with exceptional heart care, we have created designated Provider Care Teams. These Care Teams include your primary Cardiologist (physician) and Advanced Practice Providers (APPs- Physician Assistants and Nurse Practitioners) who all work together to provide you with the care you need, when you need it.   You may see any of the following providers on your designated Care Team at your next follow up: Marland Kitchen Dr Glori Bickers . Dr Loralie Champagne . Dr Vickki Muff . Darrick Grinder, NP . Lyda Jester, [place] . Audry Riles, PharmD   Please be sure to bring in all your medications bottles to every appointment.   If you have any questions or concerns before your next appointment please send [place] a message through [place] or call our office at [phone].    TO LEAVE A MESSAGE FOR THE NURSE SELECT OPTION 2, PLEASE LEAVE A MESSAGE INCLUDING: . YOUR NAME . DATE OF BIRTH . CALL BACK NUMBER . REASON FOR CALL**this is important as we prioritize the call backs  YOU WILL RECEIVE A CALL BACK THE SAME DAY AS LONG AS YOU CALL BEFORE 4:00 PM

## 2020-08-12 NOTE — Progress Notes (Signed)
ReDS Vest / Clip - 08/12/20 1500      ReDS Vest / Clip   Station Marker C    Ruler Value 30    ReDS Value Range Moderate volume overload    ReDS Actual Value 39

## 2020-08-19 DIAGNOSIS — E119 Type 2 diabetes mellitus without complications: Secondary | ICD-10-CM | POA: Diagnosis not present

## 2020-08-19 DIAGNOSIS — H04123 Dry eye syndrome of bilateral lacrimal glands: Secondary | ICD-10-CM | POA: Diagnosis not present

## 2020-08-19 DIAGNOSIS — H40023 Open angle with borderline findings, high risk, bilateral: Secondary | ICD-10-CM | POA: Diagnosis not present

## 2020-08-19 DIAGNOSIS — H26491 Other secondary cataract, right eye: Secondary | ICD-10-CM | POA: Diagnosis not present

## 2020-08-20 DIAGNOSIS — Z8673 Personal history of transient ischemic attack (TIA), and cerebral infarction without residual deficits: Secondary | ICD-10-CM | POA: Diagnosis not present

## 2020-08-20 DIAGNOSIS — Z8616 Personal history of COVID-19: Secondary | ICD-10-CM | POA: Diagnosis not present

## 2020-08-20 DIAGNOSIS — I509 Heart failure, unspecified: Secondary | ICD-10-CM | POA: Diagnosis not present

## 2020-08-20 DIAGNOSIS — G4733 Obstructive sleep apnea (adult) (pediatric): Secondary | ICD-10-CM | POA: Diagnosis not present

## 2020-08-20 DIAGNOSIS — N184 Chronic kidney disease, stage 4 (severe): Secondary | ICD-10-CM | POA: Diagnosis not present

## 2020-08-20 DIAGNOSIS — Z6835 Body mass index (BMI) 35.0-35.9, adult: Secondary | ICD-10-CM | POA: Diagnosis not present

## 2020-08-20 DIAGNOSIS — E7800 Pure hypercholesterolemia, unspecified: Secondary | ICD-10-CM | POA: Diagnosis not present

## 2020-08-20 DIAGNOSIS — E118 Type 2 diabetes mellitus with unspecified complications: Secondary | ICD-10-CM | POA: Diagnosis not present

## 2020-08-20 DIAGNOSIS — E039 Hypothyroidism, unspecified: Secondary | ICD-10-CM | POA: Diagnosis not present

## 2020-08-20 DIAGNOSIS — I10 Essential (primary) hypertension: Secondary | ICD-10-CM | POA: Diagnosis not present

## 2020-08-28 ENCOUNTER — Telehealth (HOSPITAL_COMMUNITY): Payer: Self-pay | Admitting: *Deleted

## 2020-08-28 DIAGNOSIS — I10 Essential (primary) hypertension: Secondary | ICD-10-CM | POA: Diagnosis not present

## 2020-08-28 DIAGNOSIS — I5032 Chronic diastolic (congestive) heart failure: Secondary | ICD-10-CM | POA: Diagnosis not present

## 2020-08-28 DIAGNOSIS — I272 Pulmonary hypertension, unspecified: Secondary | ICD-10-CM | POA: Diagnosis not present

## 2020-08-28 DIAGNOSIS — I5022 Chronic systolic (congestive) heart failure: Secondary | ICD-10-CM | POA: Diagnosis not present

## 2020-08-28 DIAGNOSIS — E119 Type 2 diabetes mellitus without complications: Secondary | ICD-10-CM | POA: Diagnosis not present

## 2020-08-28 DIAGNOSIS — G4733 Obstructive sleep apnea (adult) (pediatric): Secondary | ICD-10-CM | POA: Diagnosis not present

## 2020-08-28 DIAGNOSIS — K219 Gastro-esophageal reflux disease without esophagitis: Secondary | ICD-10-CM | POA: Diagnosis not present

## 2020-08-28 DIAGNOSIS — K5900 Constipation, unspecified: Secondary | ICD-10-CM | POA: Diagnosis not present

## 2020-08-28 NOTE — Telephone Encounter (Signed)
Pt called stating her weight is up 10lbs x 1 week, her left foot is swollen, and her wheezing/shortness of breath is worse. Pt said she has been taking all of her medications.   Routed to [place] for advice

## 2020-08-28 NOTE — Telephone Encounter (Signed)
Left VM requesting return call.

## 2020-08-28 NOTE — Telephone Encounter (Signed)
Increase torsemide to 100 mg twice a day x 2 days then back to 50 mg twice a day.   Take Kdur 20 meq twice a day x 2 days.   Check BMET in 7 days.   Azalea Cedar Np_C  12:56 PM

## 2020-08-29 NOTE — Telephone Encounter (Signed)
Second attempt to reach pt by phone no answer/left vm for return call

## 2020-09-01 NOTE — Telephone Encounter (Signed)
Pt left vm requesting return call. I called pt back no answer/left 3rd VM.

## 2020-09-02 DIAGNOSIS — E876 Hypokalemia: Secondary | ICD-10-CM | POA: Diagnosis not present

## 2020-09-02 DIAGNOSIS — Z6835 Body mass index (BMI) 35.0-35.9, adult: Secondary | ICD-10-CM | POA: Diagnosis not present

## 2020-09-02 DIAGNOSIS — I10 Essential (primary) hypertension: Secondary | ICD-10-CM | POA: Diagnosis not present

## 2020-09-02 DIAGNOSIS — I5042 Chronic combined systolic (congestive) and diastolic (congestive) heart failure: Secondary | ICD-10-CM | POA: Diagnosis not present

## 2020-09-02 DIAGNOSIS — Z8673 Personal history of transient ischemic attack (TIA), and cerebral infarction without residual deficits: Secondary | ICD-10-CM | POA: Diagnosis not present

## 2020-09-02 DIAGNOSIS — N184 Chronic kidney disease, stage 4 (severe): Secondary | ICD-10-CM | POA: Diagnosis not present

## 2020-09-02 DIAGNOSIS — G4733 Obstructive sleep apnea (adult) (pediatric): Secondary | ICD-10-CM | POA: Diagnosis not present

## 2020-09-02 DIAGNOSIS — E7800 Pure hypercholesterolemia, unspecified: Secondary | ICD-10-CM | POA: Diagnosis not present

## 2020-09-02 DIAGNOSIS — E118 Type 2 diabetes mellitus with unspecified complications: Secondary | ICD-10-CM | POA: Diagnosis not present

## 2020-09-02 DIAGNOSIS — I5189 Other ill-defined heart diseases: Secondary | ICD-10-CM | POA: Diagnosis not present

## 2020-09-02 DIAGNOSIS — E039 Hypothyroidism, unspecified: Secondary | ICD-10-CM | POA: Diagnosis not present

## 2020-09-02 DIAGNOSIS — Z8616 Personal history of COVID-19: Secondary | ICD-10-CM | POA: Diagnosis not present

## 2020-09-02 NOTE — Telephone Encounter (Signed)
Left 4th VM for pt to return my call.

## 2020-09-03 ENCOUNTER — Encounter (HOSPITAL_COMMUNITY): Payer: Self-pay | Admitting: Emergency Medicine

## 2020-09-03 ENCOUNTER — Other Ambulatory Visit: Payer: Self-pay

## 2020-09-03 ENCOUNTER — Ambulatory Visit
Admission: EM | Admit: 2020-09-03 | Discharge: 2020-09-03 | Disposition: A | Discharge status: Home / Self Care | Payer: Medicare Other

## 2020-09-03 ENCOUNTER — Emergency Department (HOSPITAL_COMMUNITY): Payer: Medicare Other

## 2020-09-03 ENCOUNTER — Inpatient Hospital Stay (HOSPITAL_COMMUNITY)
Admission: EM | Admit: 2020-09-03 | Discharge: 2020-09-06 | DRG: 291 | Disposition: A | Discharge status: Home / Self Care | Payer: Medicare Other | Attending: Internal Medicine | Admitting: Internal Medicine

## 2020-09-03 DIAGNOSIS — E876 Hypokalemia: Secondary | ICD-10-CM | POA: Diagnosis present

## 2020-09-03 DIAGNOSIS — Z6835 Body mass index (BMI) 35.0-35.9, adult: Secondary | ICD-10-CM

## 2020-09-03 DIAGNOSIS — N1832 Chronic kidney disease, stage 3b: Secondary | ICD-10-CM

## 2020-09-03 DIAGNOSIS — R040 Epistaxis: Secondary | ICD-10-CM | POA: Diagnosis not present

## 2020-09-03 DIAGNOSIS — Z8673 Personal history of transient ischemic attack (TIA), and cerebral infarction without residual deficits: Secondary | ICD-10-CM | POA: Diagnosis not present

## 2020-09-03 DIAGNOSIS — E119 Type 2 diabetes mellitus without complications: Secondary | ICD-10-CM

## 2020-09-03 DIAGNOSIS — E1122 Type 2 diabetes mellitus with diabetic chronic kidney disease: Secondary | ICD-10-CM | POA: Diagnosis present

## 2020-09-03 DIAGNOSIS — I130 Hypertensive heart and chronic kidney disease with heart failure and stage 1 through stage 4 chronic kidney disease, or unspecified chronic kidney disease: Secondary | ICD-10-CM | POA: Diagnosis present

## 2020-09-03 DIAGNOSIS — I509 Heart failure, unspecified: Secondary | ICD-10-CM | POA: Diagnosis not present

## 2020-09-03 DIAGNOSIS — Z794 Long term (current) use of insulin: Secondary | ICD-10-CM

## 2020-09-03 DIAGNOSIS — N183 Chronic kidney disease, stage 3 unspecified: Secondary | ICD-10-CM | POA: Diagnosis present

## 2020-09-03 DIAGNOSIS — I428 Other cardiomyopathies: Secondary | ICD-10-CM | POA: Diagnosis present

## 2020-09-03 DIAGNOSIS — I272 Pulmonary hypertension, unspecified: Secondary | ICD-10-CM | POA: Diagnosis not present

## 2020-09-03 DIAGNOSIS — K219 Gastro-esophageal reflux disease without esophagitis: Secondary | ICD-10-CM | POA: Diagnosis present

## 2020-09-03 DIAGNOSIS — R0602 Shortness of breath: Secondary | ICD-10-CM | POA: Diagnosis present

## 2020-09-03 DIAGNOSIS — I2721 Secondary pulmonary arterial hypertension: Secondary | ICD-10-CM

## 2020-09-03 DIAGNOSIS — Z79890 Hormone replacement therapy (postmenopausal): Secondary | ICD-10-CM

## 2020-09-03 DIAGNOSIS — I5043 Acute on chronic combined systolic (congestive) and diastolic (congestive) heart failure: Secondary | ICD-10-CM | POA: Diagnosis not present

## 2020-09-03 DIAGNOSIS — E669 Obesity, unspecified: Secondary | ICD-10-CM | POA: Diagnosis present

## 2020-09-03 DIAGNOSIS — N184 Chronic kidney disease, stage 4 (severe): Secondary | ICD-10-CM

## 2020-09-03 DIAGNOSIS — I252 Old myocardial infarction: Secondary | ICD-10-CM | POA: Diagnosis not present

## 2020-09-03 DIAGNOSIS — I5023 Acute on chronic systolic (congestive) heart failure: Secondary | ICD-10-CM | POA: Diagnosis not present

## 2020-09-03 DIAGNOSIS — R062 Wheezing: Secondary | ICD-10-CM | POA: Diagnosis not present

## 2020-09-03 DIAGNOSIS — N179 Acute kidney failure, unspecified: Secondary | ICD-10-CM | POA: Diagnosis not present

## 2020-09-03 DIAGNOSIS — Z7982 Long term (current) use of aspirin: Secondary | ICD-10-CM | POA: Diagnosis not present

## 2020-09-03 DIAGNOSIS — Z7902 Long term (current) use of antithrombotics/antiplatelets: Secondary | ICD-10-CM

## 2020-09-03 DIAGNOSIS — Z79899 Other long term (current) drug therapy: Secondary | ICD-10-CM | POA: Diagnosis not present

## 2020-09-03 DIAGNOSIS — Z20822 Contact with and (suspected) exposure to covid-19: Secondary | ICD-10-CM | POA: Diagnosis present

## 2020-09-03 DIAGNOSIS — I10 Essential (primary) hypertension: Secondary | ICD-10-CM | POA: Diagnosis present

## 2020-09-03 DIAGNOSIS — I2510 Atherosclerotic heart disease of native coronary artery without angina pectoris: Secondary | ICD-10-CM | POA: Diagnosis present

## 2020-09-03 DIAGNOSIS — E039 Hypothyroidism, unspecified: Secondary | ICD-10-CM | POA: Diagnosis present

## 2020-09-03 DIAGNOSIS — Z90710 Acquired absence of both cervix and uterus: Secondary | ICD-10-CM

## 2020-09-03 DIAGNOSIS — J811 Chronic pulmonary edema: Secondary | ICD-10-CM | POA: Diagnosis not present

## 2020-09-03 DIAGNOSIS — I110 Hypertensive heart disease with heart failure: Secondary | ICD-10-CM | POA: Diagnosis not present

## 2020-09-03 DIAGNOSIS — J9601 Acute respiratory failure with hypoxia: Secondary | ICD-10-CM | POA: Diagnosis not present

## 2020-09-03 DIAGNOSIS — R0682 Tachypnea, not elsewhere classified: Secondary | ICD-10-CM

## 2020-09-03 DIAGNOSIS — G4733 Obstructive sleep apnea (adult) (pediatric): Secondary | ICD-10-CM | POA: Diagnosis present

## 2020-09-03 DIAGNOSIS — I517 Cardiomegaly: Secondary | ICD-10-CM | POA: Diagnosis not present

## 2020-09-03 LAB — CBC WITH DIFFERENTIAL/PLATELET
Abs Immature Granulocytes: 0.05 10*3/uL (ref 0.00–0.07)
Basophils Absolute: 0.1 10*3/uL (ref 0.0–0.1)
Basophils Relative: 1 %
Eosinophils Absolute: 0.2 10*3/uL (ref 0.0–0.5)
Eosinophils Relative: 2 %
HCT: 30.3 % — ABNORMAL LOW (ref 36.0–46.0)
Hemoglobin: 9 g/dL — ABNORMAL LOW (ref 12.0–15.0)
Immature Granulocytes: 1 %
Lymphocytes Relative: 25 %
Lymphs Abs: 1.8 10*3/uL (ref 0.7–4.0)
MCH: 24.1 pg — ABNORMAL LOW (ref 26.0–34.0)
MCHC: 29.7 g/dL — ABNORMAL LOW (ref 30.0–36.0)
MCV: 81 fL (ref 80.0–100.0)
Monocytes Absolute: 0.9 10*3/uL (ref 0.1–1.0)
Monocytes Relative: 12 %
Neutro Abs: 4.5 10*3/uL (ref 1.7–7.7)
Neutrophils Relative %: 59 %
Platelets: 121 10*3/uL — ABNORMAL LOW (ref 150–400)
RBC: 3.74 MIL/uL — ABNORMAL LOW (ref 3.87–5.11)
RDW: 16.9 % — ABNORMAL HIGH (ref 11.5–15.5)
WBC: 7.4 10*3/uL (ref 4.0–10.5)
nRBC: 0.3 % — ABNORMAL HIGH (ref 0.0–0.2)

## 2020-09-03 LAB — COMPREHENSIVE METABOLIC PANEL
ALT: 58 U/L — ABNORMAL HIGH (ref 0–44)
AST: 55 U/L — ABNORMAL HIGH (ref 15–41)
Albumin: 2.8 g/dL — ABNORMAL LOW (ref 3.5–5.0)
Alkaline Phosphatase: 111 U/L (ref 38–126)
Anion gap: 9 (ref 5–15)
BUN: 54 mg/dL — ABNORMAL HIGH (ref 8–23)
CO2: 25 mmol/L (ref 22–32)
Calcium: 7.6 mg/dL — ABNORMAL LOW (ref 8.9–10.3)
Chloride: 107 mmol/L (ref 98–111)
Creatinine, Ser: 2.69 mg/dL — ABNORMAL HIGH (ref 0.44–1.00)
GFR, Estimated: 18 mL/min — ABNORMAL LOW (ref >60)
Glucose, Bld: 231 mg/dL — ABNORMAL HIGH (ref 70–99)
Potassium: 4.6 mmol/L (ref 3.5–5.1)
Sodium: 141 mmol/L (ref 135–145)
Total Bilirubin: 0.8 mg/dL (ref 0.3–1.2)
Total Protein: 5.6 g/dL — ABNORMAL LOW (ref 6.5–8.1)

## 2020-09-03 LAB — TROPONIN I (HIGH SENSITIVITY)
Troponin I (High Sensitivity): 37 ng/L — ABNORMAL HIGH (ref <18)
Troponin I (High Sensitivity): 30 ng/L — ABNORMAL HIGH (ref <18)

## 2020-09-03 LAB — RESP PANEL BY RT-PCR (FLU A&B, COVID) ARPGX2
Influenza A by PCR: NEGATIVE
Influenza B by PCR: NEGATIVE
SARS Coronavirus 2 by RT PCR: NEGATIVE

## 2020-09-03 LAB — BRAIN NATRIURETIC PEPTIDE: B Natriuretic Peptide: 1286.7 pg/mL — ABNORMAL HIGH (ref 0.0–100.0)

## 2020-09-03 LAB — CBG MONITORING, ED: Glucose-Capillary: 118 mg/dL — ABNORMAL HIGH (ref 70–99)

## 2020-09-03 MED ORDER — PREGABALIN 25 MG PO CAPS
75.0000 mg | ORAL_CAPSULE | Freq: Every day | ORAL | Status: DC
  Administered 2020-09-04 – 2020-09-06 (×3): 75 mg via ORAL
  Filled 2020-09-03 (×3): qty 3

## 2020-09-03 MED ORDER — FLUTICASONE PROPIONATE 50 MCG/ACT NA SUSP
2.0000 | Freq: Two times a day (BID) | NASAL | Status: DC | PRN
  Filled 2020-09-03: qty 16

## 2020-09-03 MED ORDER — SILDENAFIL CITRATE 20 MG PO TABS
60.0000 mg | ORAL_TABLET | Freq: Three times a day (TID) | ORAL | Status: DC
  Administered 2020-09-04 – 2020-09-06 (×8): 60 mg via ORAL
  Filled 2020-09-03 (×11): qty 3

## 2020-09-03 MED ORDER — PREGABALIN 25 MG PO CAPS
150.0000 mg | ORAL_CAPSULE | Freq: Every day | ORAL | Status: DC
  Administered 2020-09-04 – 2020-09-05 (×3): 150 mg via ORAL
  Filled 2020-09-03 (×3): qty 2

## 2020-09-03 MED ORDER — SODIUM CHLORIDE 0.9% FLUSH
3.0000 mL | Freq: Two times a day (BID) | INTRAVENOUS | Status: DC
  Administered 2020-09-04 – 2020-09-06 (×6): 3 mL via INTRAVENOUS

## 2020-09-03 MED ORDER — CARVEDILOL 6.25 MG PO TABS
6.2500 mg | ORAL_TABLET | Freq: Two times a day (BID) | ORAL | Status: DC
  Administered 2020-09-04 – 2020-09-06 (×5): 6.25 mg via ORAL
  Filled 2020-09-03 (×5): qty 1

## 2020-09-03 MED ORDER — ONDANSETRON HCL 4 MG/2ML IJ SOLN
4.0000 mg | Freq: Four times a day (QID) | INTRAMUSCULAR | Status: DC | PRN
Start: 2020-09-03 — End: 2020-09-06

## 2020-09-03 MED ORDER — BUSPIRONE HCL 15 MG PO TABS
7.5000 mg | ORAL_TABLET | Freq: Two times a day (BID) | ORAL | Status: DC
  Administered 2020-09-04 – 2020-09-06 (×6): 7.5 mg via ORAL
  Filled 2020-09-03 (×7): qty 1

## 2020-09-03 MED ORDER — ALLOPURINOL 100 MG PO TABS
50.0000 mg | ORAL_TABLET | Freq: Every day | ORAL | Status: DC
  Administered 2020-09-04 – 2020-09-06 (×4): 50 mg via ORAL
  Filled 2020-09-03: qty 1
  Filled 2020-09-03: qty 0.5
  Filled 2020-09-03 (×2): qty 1

## 2020-09-03 MED ORDER — CLOPIDOGREL BISULFATE 75 MG PO TABS
75.0000 mg | ORAL_TABLET | Freq: Every day | ORAL | Status: DC
  Administered 2020-09-04 – 2020-09-06 (×3): 75 mg via ORAL
  Filled 2020-09-03 (×3): qty 1

## 2020-09-03 MED ORDER — PREGABALIN 25 MG PO CAPS: 75.0000 mg | ORAL_CAPSULE | ORAL | Status: DC

## 2020-09-03 MED ORDER — FUROSEMIDE 10 MG/ML IJ SOLN
40.0000 mg | Freq: Once | INTRAMUSCULAR | Status: AC
  Administered 2020-09-03: 40 mg via INTRAVENOUS
  Filled 2020-09-03: qty 4

## 2020-09-03 MED ORDER — ALBUTEROL SULFATE (2.5 MG/3ML) 0.083% IN NEBU: 3.0000 mL | INHALATION_SOLUTION | RESPIRATORY_TRACT | Status: DC | PRN

## 2020-09-03 MED ORDER — INSULIN ASPART 100 UNIT/ML IJ SOLN
0.0000 [IU] | Freq: Three times a day (TID) | INTRAMUSCULAR | Status: DC
  Administered 2020-09-04 – 2020-09-05 (×5): 2 [IU] via SUBCUTANEOUS
  Administered 2020-09-06 (×2): 3 [IU] via SUBCUTANEOUS

## 2020-09-03 MED ORDER — FUROSEMIDE 10 MG/ML IJ SOLN
20.0000 mg | Freq: Two times a day (BID) | INTRAMUSCULAR | Status: DC
  Administered 2020-09-04: 20 mg via INTRAVENOUS
  Filled 2020-09-03: qty 2

## 2020-09-03 MED ORDER — GUAIFENESIN ER 600 MG PO TB12
1200.0000 mg | ORAL_TABLET | Freq: Two times a day (BID) | ORAL | Status: DC
  Administered 2020-09-04 – 2020-09-06 (×6): 1200 mg via ORAL
  Filled 2020-09-03 (×6): qty 2

## 2020-09-03 MED ORDER — BENZONATATE 100 MG PO CAPS: 200.0000 mg | ORAL_CAPSULE | Freq: Three times a day (TID) | ORAL | Status: DC | PRN

## 2020-09-03 MED ORDER — ACETAMINOPHEN 500 MG PO TABS: 1000.0000 mg | ORAL_TABLET | Freq: Four times a day (QID) | ORAL | Status: DC | PRN

## 2020-09-03 MED ORDER — SODIUM CHLORIDE 0.9 % IV SOLN: 250.0000 mL | INTRAVENOUS | Status: DC | PRN

## 2020-09-03 MED ORDER — ASPIRIN EC 81 MG PO TBEC
81.0000 mg | DELAYED_RELEASE_TABLET | Freq: Every day | ORAL | Status: DC
  Administered 2020-09-04 – 2020-09-06 (×3): 81 mg via ORAL
  Filled 2020-09-03 (×3): qty 1

## 2020-09-03 MED ORDER — ALBUTEROL SULFATE HFA 108 (90 BASE) MCG/ACT IN AERS: 2.0000 | INHALATION_SPRAY | Freq: Four times a day (QID) | RESPIRATORY_TRACT | Status: DC | PRN

## 2020-09-03 MED ORDER — ACETAMINOPHEN 325 MG PO TABS
650.0000 mg | ORAL_TABLET | ORAL | Status: DC | PRN
  Administered 2020-09-05: 650 mg via ORAL
  Filled 2020-09-03: qty 2

## 2020-09-03 MED ORDER — SODIUM CHLORIDE 0.9% FLUSH: 3.0000 mL | INTRAVENOUS | Status: DC | PRN

## 2020-09-03 MED ORDER — LEVOTHYROXINE SODIUM 100 MCG PO TABS
100.0000 ug | ORAL_TABLET | ORAL | Status: DC
  Administered 2020-09-04 – 2020-09-05 (×2): 100 ug via ORAL
  Filled 2020-09-03 (×2): qty 1

## 2020-09-03 MED ORDER — DIAZEPAM 5 MG PO TABS
5.0000 mg | ORAL_TABLET | Freq: Two times a day (BID) | ORAL | Status: DC | PRN
  Administered 2020-09-04: 5 mg via ORAL
  Filled 2020-09-03: qty 1

## 2020-09-03 NOTE — ED Triage Notes (Signed)
Pt c/o worsening shortness of breath and wheezing since Saturday. Hx CHF, c/o new swelling to lower extremities.

## 2020-09-03 NOTE — ED Provider Notes (Signed)
Emergency Medicine Provider Triage Evaluation Note  Grace Bradford 75 y.o. female was evaluated in triage.  Pt complains of shortness of breath that has been worsening over the last 4 to 5 days.  She has also had some bilateral lower extremity swelling.  She called her nurse line and was told to increase her Lasix.  Patient states that she did so but states that symptoms not improved.  She initially went to her primary care doctor and they sent her to the ED for further evaluation.  Denies any chest pain, abdominal pain, fevers, cough.   Review of Systems  Positive: Shortness of breath, leg swelling Negative: Chest pain, abdominal pain, nausea/vomiting.  Physical Exam  BP 134/82   Pulse 70   Temp 98.2 F (36.8 C) (Oral)   Resp 18   Ht 5\' 4"  (1.626 m)   Wt 65.8 kg   SpO2 100%   BMI 24.89 kg/m  Gen:   Awake, no distress   HEENT:  Atraumatic  Resp:  Normal effort.  Able to speak in full sentences without any difficulty.  No evidence of respiratory distress. Cardiac:  Normal rate  Abd:   Nondistended, nontender  MSK:   Moves extremities without difficulty  Neuro:  Speech clear   Other:   Mild swelling noted to bilateral lower extremities.  No overlying warmth, erythema  Medical Decision Making  Medically screening exam initiated at 3:10 PM  Appropriate orders placed.  Grace Bradford was informed that the remainder of the evaluation will be completed by another provider, this initial triage assessment does not replace that evaluation, and the importance of remaining in the ED until their evaluation is complete.   Clinical Impression  Shortness of breath   Portions of this note were generated with Dragon dictation software. Dictation errors may occur despite best attempts at proofreading.     Volanda Napoleon, PA-C 09/03/20 1511    Arnaldo Natal, MD 09/03/20 [phone]
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
## 2020-09-03 NOTE — ED Provider Notes (Signed)
Patient presents today with over 3 to 4 days of gradually progressing shortness of breath.  She has been in contact with her [place] who has encouraged her to increase her furosemide however she is continue to have persistent lower extremity edema worsening shortness of breath and endorses some wheezing with significant weakness. Vital signs are stable here in clinic however patient does have visible lower extremity swelling and is very tachypneic. Given complexity of patient's coexisting medical conditions including CHF, pulmonary arterial hypertension, and chronic renal failure stage IV patient warrants further evaluation and emergent assessment in the setting of the emergency department to evaluate the source of her symptoms.  Patient has been has been informed to transport patient immediately to [place], FNP-C    Scot Jun, [place] 09/03/20 1426

## 2020-09-03 NOTE — ED Notes (Signed)
Pt O2 dropped to 87% with ambulation. Pt became very short of breath within 1 minute of ambulation.

## 2020-09-04 DIAGNOSIS — N184 Chronic kidney disease, stage 4 (severe): Secondary | ICD-10-CM

## 2020-09-04 LAB — BASIC METABOLIC PANEL
Anion gap: 10 (ref 5–15)
BUN: 53 mg/dL — ABNORMAL HIGH (ref 8–23)
CO2: 25 mmol/L (ref 22–32)
Calcium: 7.8 mg/dL — ABNORMAL LOW (ref 8.9–10.3)
Chloride: 107 mmol/L (ref 98–111)
Creatinine, Ser: 2.53 mg/dL — ABNORMAL HIGH (ref 0.44–1.00)
GFR, Estimated: 20 mL/min — ABNORMAL LOW (ref >60)
Glucose, Bld: 178 mg/dL — ABNORMAL HIGH (ref 70–99)
Potassium: 4.4 mmol/L (ref 3.5–5.1)
Sodium: 142 mmol/L (ref 135–145)

## 2020-09-04 LAB — GLUCOSE, CAPILLARY
Glucose-Capillary: 105 mg/dL — ABNORMAL HIGH (ref 70–99)
Glucose-Capillary: 142 mg/dL — ABNORMAL HIGH (ref 70–99)
Glucose-Capillary: 131 mg/dL — ABNORMAL HIGH (ref 70–99)
Glucose-Capillary: 127 mg/dL — ABNORMAL HIGH (ref 70–99)

## 2020-09-04 LAB — HEMOGLOBIN A1C
Hgb A1c MFr Bld: 7.1 % — ABNORMAL HIGH (ref 4.8–5.6)
Mean Plasma Glucose: 157.07 mg/dL

## 2020-09-04 MED ORDER — PRAVASTATIN SODIUM 40 MG PO TABS
80.0000 mg | ORAL_TABLET | Freq: Every day | ORAL | Status: DC
  Administered 2020-09-04 – 2020-09-05 (×2): 80 mg via ORAL
  Filled 2020-09-04 (×2): qty 2

## 2020-09-04 MED ORDER — FUROSEMIDE 10 MG/ML IJ SOLN: 40.0000 mg | Freq: Two times a day (BID) | INTRAMUSCULAR | Status: DC

## 2020-09-04 MED ORDER — ENOXAPARIN SODIUM 40 MG/0.4ML IJ SOSY
40.0000 mg | PREFILLED_SYRINGE | INTRAMUSCULAR | Status: DC
  Administered 2020-09-04: 40 mg via SUBCUTANEOUS
  Filled 2020-09-04: qty 0.4

## 2020-09-04 MED ORDER — IVABRADINE HCL 7.5 MG PO TABS
7.5000 mg | ORAL_TABLET | Freq: Two times a day (BID) | ORAL | Status: DC
  Administered 2020-09-04 – 2020-09-06 (×4): 7.5 mg via ORAL
  Filled 2020-09-04 (×5): qty 1

## 2020-09-04 MED ORDER — HYDROCODONE-ACETAMINOPHEN 10-325 MG PO TABS
1.0000 | ORAL_TABLET | Freq: Four times a day (QID) | ORAL | Status: DC | PRN
  Administered 2020-09-04 – 2020-09-06 (×4): 1 via ORAL
  Filled 2020-09-04 (×4): qty 1

## 2020-09-04 MED ORDER — DICLOFENAC SODIUM 1 % EX GEL
2.0000 g | Freq: Four times a day (QID) | CUTANEOUS | Status: DC | PRN
  Administered 2020-09-04 – 2020-09-06 (×3): 2 g via TOPICAL
  Filled 2020-09-04: qty 100

## 2020-09-04 MED ORDER — CLOBETASOL PROPIONATE 0.05 % EX CREA
1.0000 "application " | TOPICAL_CREAM | Freq: Two times a day (BID) | CUTANEOUS | Status: DC
  Administered 2020-09-04 – 2020-09-06 (×4): 1 via TOPICAL
  Filled 2020-09-04: qty 15

## 2020-09-04 MED ORDER — FUROSEMIDE 10 MG/ML IJ SOLN
40.0000 mg | Freq: Two times a day (BID) | INTRAMUSCULAR | Status: DC
  Administered 2020-09-04: 40 mg via INTRAVENOUS
  Filled 2020-09-04: qty 4

## 2020-09-04 MED ORDER — FUROSEMIDE 10 MG/ML IJ SOLN
40.0000 mg | Freq: Once | INTRAMUSCULAR | Status: AC
  Administered 2020-09-04: 40 mg via INTRAVENOUS
  Filled 2020-09-04: qty 4

## 2020-09-04 MED ORDER — ENOXAPARIN SODIUM 30 MG/0.3ML IJ SOSY
30.0000 mg | PREFILLED_SYRINGE | INTRAMUSCULAR | Status: DC
  Administered 2020-09-05 – 2020-09-06 (×2): 30 mg via SUBCUTANEOUS
  Filled 2020-09-04 (×2): qty 0.3

## 2020-09-04 MED ORDER — LORATADINE 10 MG PO TABS
10.0000 mg | ORAL_TABLET | Freq: Every day | ORAL | Status: DC
  Administered 2020-09-04 – 2020-09-06 (×3): 10 mg via ORAL
  Filled 2020-09-04 (×3): qty 1

## 2020-09-04 MED ORDER — FUROSEMIDE 10 MG/ML IJ SOLN
80.0000 mg | Freq: Two times a day (BID) | INTRAMUSCULAR | Status: DC
  Administered 2020-09-04 – 2020-09-06 (×4): 80 mg via INTRAVENOUS
  Filled 2020-09-04 (×4): qty 8

## 2020-09-04 NOTE — Progress Notes (Signed)
Heart Failure Nurse Navigator Progress Note   ReDS Vest / Clip - 09/04/20 1200      ReDS Vest / Clip   Station Marker A    Ruler Value 33    ReDS Value Range High volume overload    ReDS Actual Value 44          Weight 204lbs, height 5'1", pt sitting in recliner.

## 2020-09-04 NOTE — Progress Notes (Signed)
PROGRESS NOTE  Grace Bradford QPR:[id number] DOB: [birth year] DOA: 09/03/2020 PCP: Jani Gravel, MD   LOS: 1 day   Brief Narrative / Interim history: 75 year old female with history of chronic combined CHF, EF 40-45%, RV dysfunction, CKD stage IV with baseline creatinine around 2.6, DM 2, here with progressive shortness of breath and fluid buildup.  This has been going on for few weeks.  She saw heart failure clinic couple of weeks ago and at that time her reticulocyte dose was slightly increased.  Despite that she continued to feel more more short of breath and decided to come to the hospital.  Subjective / 24h Interval events: She tells me she is feeling about the same this morning as she was yesterday.  Assessment & Plan: Principal Problem Acute hypoxic respiratory failure due to acute on chronic systolic CHF with acute pulmonary edema -likely in the setting of fluid overload/CHF exacerbation.  She reports dietary compliance as well as plans with her medications -Continue IV Lasix, daily weights, strict ins and outs. -Cardiology consulted.  Continue sildenafil for RV dysfunction  Active Problems DM2-continue sliding scale  Essential hypertension-continue Coreg, furosemide  Hypothyroidism-continue Synthroid  CAD-continue aspirin, Plavix, Coreg, Crestor  Chronic kidney disease stage IV-Baseline creatinine around 2.6 per cardiology notes.  Currently at baseline.  Monitor with diuresis  Scheduled Meds: . allopurinol  50 mg Oral Daily  . aspirin EC  81 mg Oral Daily  . busPIRone  7.5 mg Oral BID  . carvedilol  6.25 mg Oral BID WC  . clopidogrel  75 mg Oral Daily  . furosemide  20 mg Intravenous Q12H  . guaiFENesin  1,200 mg Oral BID  . insulin aspart  0-15 Units Subcutaneous TID WC  . levothyroxine  100 mcg Oral Once per day on Mon Tue Wed Thu Fri  . pregabalin  75 mg Oral Daily   And  . pregabalin  150 mg Oral QHS  . sildenafil  60 mg Oral TID  . sodium chloride flush  3 mL  Intravenous Q12H   Continuous Infusions: . sodium chloride     PRN Meds:.sodium chloride, acetaminophen, albuterol, benzonatate, diazepam, fluticasone, ondansetron (ZOFRAN) IV, sodium chloride flush  Diet Orders (From admission, onward)    Start     Ordered   09/03/20 2144  Diet heart healthy/carb modified Room service appropriate? Yes; Fluid consistency: Thin  Diet effective now       Question Answer Comment  Diet-HS Snack? Nothing   Room service appropriate? Yes   Fluid consistency: Thin      09/03/20 2143          DVT prophylaxis: SCDs Start: 09/03/20 2144     Code Status: Full Code  Family Communication: No family at bedside  Status is: Inpatient  Remains inpatient appropriate because:Inpatient level of care appropriate due to severity of illness   Dispo: The patient is from: Home              Anticipated d/c is to: Home              Patient currently is not medically stable to d/c.   Difficult to place patient No   Level of care: Telemetry Cardiac  Consultants:  Cardiology  Procedures:  none  Microbiology  none  Antimicrobials: none    Objective: Vitals:   09/04/20 0100 09/04/20 0107 09/04/20 0437 09/04/20 0815  BP: (!) 125/105  114/79 130/85  Pulse: 80  82 92  Resp: 15   17  Temp:  98.3 F (36.8 C)  97.8 F (36.6 C) 98.4 F (36.9 C)  TempSrc: Oral  Oral Oral  SpO2: 98%   94%  Weight:  93.5 kg 93.1 kg   Height:        Intake/Output Summary (Last 24 hours) at 09/04/2020 0539 Last data filed at 09/04/2020 0800 Gross per 24 hour  Intake 240 ml  Output --  Net 240 ml   Filed Weights   09/04/20 0107 09/04/20 0437  Weight: 93.5 kg 93.1 kg    Examination:  Constitutional: NAD Eyes: no scleral icterus ENMT: Mucous membranes are moist.  Neck: normal, supple Respiratory: Bibasilar crackles, no wheezing, normal respiratory effort Cardiovascular: Regular rate and rhythm, no murmurs / rubs / gallops.  1+ pitting lower extremity  edema Abdomen: non distended, no tenderness. Bowel sounds positive.  Musculoskeletal: no clubbing / cyanosis.  Skin: no rashes Neurologic: CN 2-12 grossly intact. Strength 5/5 in all 4.    Data Reviewed: I have independently reviewed following labs and imaging studies   CBC: Recent Labs  Lab 09/03/20 1511  WBC 7.4  NEUTROABS 4.5  HGB 9.0*  HCT 30.3*  MCV 81.0  PLT 767*   Basic Metabolic Panel: Recent Labs  Lab 09/03/20 1511 09/04/20 0102  NA 141 142  K 4.6 4.4  CL 107 107  CO2 25 25  GLUCOSE 231* 178*  BUN 54* 53*  CREATININE 2.69* 2.53*  CALCIUM 7.6* 7.8*   Liver Function Tests: Recent Labs  Lab 09/03/20 1511  AST 55*  ALT 58*  ALKPHOS 111  BILITOT 0.8  PROT 5.6*  ALBUMIN 2.8*   Coagulation Profile: No results for input(s): INR, PROTIME in the last 168 hours. HbA1C: Recent Labs    09/04/20 0102  HGBA1C 7.1*   CBG: Recent Labs  Lab 09/03/20 2212 09/04/20 0559  GLUCAP 118* 105*    Recent Results (from the past 240 hour(s))  Resp Panel by RT-PCR (Flu A&B, Covid) Nasopharyngeal Swab     Status: None   Collection Time: 09/03/20  4:37 PM   Specimen: Nasopharyngeal Swab; Nasopharyngeal(NP) swabs in vial transport medium  Result Value Ref Range Status   SARS Coronavirus 2 by RT PCR NEGATIVE NEGATIVE Final    Comment: (NOTE) SARS-CoV-2 target nucleic acids are NOT DETECTED.  The SARS-CoV-2 RNA is generally detectable in upper respiratory specimens during the acute phase of infection. The lowest concentration of SARS-CoV-2 viral copies this assay can detect is 138 copies/mL. A negative result does not preclude SARS-Cov-2 infection and should not be used as the sole basis for treatment or other patient management decisions. A negative result may occur with  improper specimen collection/handling, submission of specimen other than nasopharyngeal swab, presence of viral mutation(s) within the areas targeted by this assay, and inadequate number of  viral copies(<138 copies/mL). A negative result must be combined with clinical observations, patient history, and epidemiological information. The expected result is Negative.  Fact Sheet for Patients:  EntrepreneurPulse.com.au  Fact Sheet for Healthcare Providers:  IncredibleEmployment.be  This test is no t yet approved or cleared by the [place] FDA and  has been authorized for detection and/or diagnosis of SARS-CoV-2 by FDA under an Emergency Use Authorization (EUA). This EUA will remain  in effect (meaning this test can be used) for the duration of the COVID-19 declaration under Section 564(b)(1) of the Act, 21 U.S.C.section 360bbb-3(b)(1), unless the authorization is terminated  or revoked sooner.       Influenza A by PCR NEGATIVE NEGATIVE  Final   Influenza B by PCR NEGATIVE NEGATIVE Final    Comment: (NOTE) The Xpert Xpress SARS-CoV-2/FLU/RSV plus assay is intended as an aid in the diagnosis of influenza from Nasopharyngeal swab specimens and should not be used as a sole basis for treatment. Nasal washings and aspirates are unacceptable for Xpert Xpress SARS-CoV-2/FLU/RSV testing.  Fact Sheet for Patients: EntrepreneurPulse.com.au  Fact Sheet for Healthcare Providers: IncredibleEmployment.be  This test is not yet approved or cleared by the [place] FDA and has been authorized for detection and/or diagnosis of SARS-CoV-2 by FDA under an Emergency Use Authorization (EUA). This EUA will remain in effect (meaning this test can be used) for the duration of the COVID-19 declaration under Section 564(b)(1) of the Act, 21 U.S.C. section 360bbb-3(b)(1), unless the authorization is terminated or revoked.  Performed at [facility], [place] [address] [place], [place] [address]      Radiology Studies: DG Chest 2 View  Result Date: 09/03/2020 CLINICAL DATA:  Shortness of breath and  wheezing History of heart failure EXAM: CHEST - 2 VIEW COMPARISON:  07/11/2019 FINDINGS: Unchanged cardiomegaly. Pulmonary vascular congestion again seen. Lungs otherwise clear. IMPRESSION: Findings most consistent with CHF. Electronically Signed   By: Miachel Roux M.D.   On: 09/03/2020 15:56    Marzetta Board, MD, PhD [facility]  Between 7 am - 7 pm I am available, please contact me via Amion (for emergencies) or Securechat (non urgent messages)  Between 7 pm - 7 am I am not available, please contact night coverage MD/APP via Amion

## 2020-09-04 NOTE — Plan of Care (Signed)
  Problem: Education: Goal: Knowledge of General Education information will improve Description Including pain rating scale, medication(s)/side effects and non-pharmacologic comfort measures Outcome: Progressing   

## 2020-09-04 NOTE — Progress Notes (Signed)
Patient arrived from ED to 5C19. Safety precautions and orders reviewed with patient. VSS. TELE applied and confirmed. SCDs applied. Family notified per request. Patient denied any pain and appears in no distress at this time. Patient ambulated to br w/ minimal assist, no SOB noted on exertion. Will cont to monitor.
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
## 2020-09-04 NOTE — Progress Notes (Addendum)
  Patient c/o chronic back pain, shared that she takes hydrocodone and skelaxin three times a day as needed. Attending MD notified.

## 2020-09-05 DIAGNOSIS — N179 Acute kidney failure, unspecified: Secondary | ICD-10-CM

## 2020-09-05 DIAGNOSIS — I272 Pulmonary hypertension, unspecified: Secondary | ICD-10-CM

## 2020-09-05 DIAGNOSIS — I5023 Acute on chronic systolic (congestive) heart failure: Secondary | ICD-10-CM

## 2020-09-05 LAB — GLUCOSE, CAPILLARY
Glucose-Capillary: 126 mg/dL — ABNORMAL HIGH (ref 70–99)
Glucose-Capillary: 136 mg/dL — ABNORMAL HIGH (ref 70–99)
Glucose-Capillary: 150 mg/dL — ABNORMAL HIGH (ref 70–99)
Glucose-Capillary: 160 mg/dL — ABNORMAL HIGH (ref 70–99)

## 2020-09-05 LAB — TSH: TSH: 0.665 u[IU]/mL (ref 0.350–4.500)

## 2020-09-05 LAB — BASIC METABOLIC PANEL
Anion gap: 10 (ref 5–15)
BUN: 52 mg/dL — ABNORMAL HIGH (ref 8–23)
CO2: 29 mmol/L (ref 22–32)
Calcium: 8.1 mg/dL — ABNORMAL LOW (ref 8.9–10.3)
Chloride: 103 mmol/L (ref 98–111)
Creatinine, Ser: 2.34 mg/dL — ABNORMAL HIGH (ref 0.44–1.00)
GFR, Estimated: 21 mL/min — ABNORMAL LOW (ref >60)
Glucose, Bld: 236 mg/dL — ABNORMAL HIGH (ref 70–99)
Potassium: 3.3 mmol/L — ABNORMAL LOW (ref 3.5–5.1)
Sodium: 142 mmol/L (ref 135–145)

## 2020-09-05 MED ORDER — POTASSIUM CHLORIDE CRYS ER 20 MEQ PO TBCR
40.0000 meq | EXTENDED_RELEASE_TABLET | ORAL | Status: AC
  Administered 2020-09-05 (×2): 40 meq via ORAL
  Filled 2020-09-05 (×2): qty 2

## 2020-09-05 MED ORDER — OXYMETAZOLINE HCL 0.05 % NA SOLN
1.0000 | Freq: Two times a day (BID) | NASAL | Status: DC
  Administered 2020-09-05 – 2020-09-06 (×3): 1 via NASAL
  Filled 2020-09-05: qty 30

## 2020-09-05 MED ORDER — DAPAGLIFLOZIN PROPANEDIOL 5 MG PO TABS
5.0000 mg | ORAL_TABLET | Freq: Every day | ORAL | Status: DC
  Administered 2020-09-05 – 2020-09-06 (×2): 5 mg via ORAL
  Filled 2020-09-05 (×2): qty 1

## 2020-09-05 NOTE — Plan of Care (Signed)

## 2020-09-05 NOTE — Progress Notes (Addendum)
Advanced Heart Failure Rounding Note  PCP-Cardiologist: None   Subjective:    Admitted w/ a/c CHF w/ fluid overload.   Diuresing w/ IV Lasix, -2.2L in UOP yesterday. Wt down 3 lb. Still fluid overloaded. No resting.   Scr improving, 2.7>>2.5>>2.3 K 3.3     Objective:   Weight Range: 92.5 kg Body mass index is 38.53 kg/m.   Vital Signs:   Temp:  [97.8 F (36.6 C)-98.7 F (37.1 C)] 98.7 F (37.1 C) (05/13 0441) Pulse Rate:  [77-84] 77 (05/13 0441) Resp:  [16-19] 19 (05/13 0441) BP: (104-140)/(59-90) 116/59 (05/13 0441) SpO2:  [98 %-100 %] 98 % (05/13 0441) Weight:  [92.5 kg] 92.5 kg (05/13 0500) Last BM Date: 09/04/20  Weight change: Filed Weights   09/04/20 0107 09/04/20 0437 09/05/20 0500  Weight: 93.5 kg 93.1 kg 92.5 kg    Intake/Output:   Intake/Output Summary (Last 24 hours) at 09/05/2020 1128 Last data filed at 09/05/2020 0049 Gross per 24 hour  Intake 600 ml  Output 2200 ml  Net -1600 ml      Physical Exam    General:  Well appearing, sitting up on side of bed. No resp difficulty HEENT: Normal Neck: Supple. JVP elevated to jaw . Carotids 2+ bilat; no bruits. No lymphadenopathy or thyromegaly appreciated. Cor: PMI nondisplaced. Regular rate & rhythm. No rubs, gallops or murmurs. Lungs: Clear Abdomen: Soft, nontender, nondistended. No hepatosplenomegaly. No bruits or masses. Good bowel sounds. Extremities: No cyanosis, clubbing, rash, 1+ edema Neuro: Alert & orientedx3, cranial nerves grossly intact. moves all 4 extremities w/o difficulty. Affect pleasant   Telemetry   NSR 70s   EKG    No new EKG to review   Labs    CBC Recent Labs    09/03/20 1511  WBC 7.4  NEUTROABS 4.5  HGB 9.0*  HCT 30.3*  MCV 81.0  PLT 270*   Basic Metabolic Panel Recent Labs    09/04/20 0102 09/05/20 0246  NA 142 142  K 4.4 3.3*  CL 107 103  CO2 25 29  GLUCOSE 178* 236*  BUN 53* 52*  CREATININE 2.53* 2.34*  CALCIUM 7.8* 8.1*   Liver Function  Tests Recent Labs    09/03/20 1511  AST 55*  ALT 58*  ALKPHOS 111  BILITOT 0.8  PROT 5.6*  ALBUMIN 2.8*   No results for input(s): LIPASE, AMYLASE in the last 72 hours. Cardiac Enzymes No results for input(s): CKTOTAL, CKMB, CKMBINDEX, TROPONINI in the last 72 hours.  BNP: BNP (last 3 results) Recent Labs    06/04/20 1530 09/03/20 1511  BNP 202.4* 1,286.7*    ProBNP (last 3 results) No results for input(s): PROBNP in the last 8760 hours.   D-Dimer No results for input(s): DDIMER in the last 72 hours. Hemoglobin A1C Recent Labs    09/04/20 0102  HGBA1C 7.1*   Fasting Lipid Panel No results for input(s): CHOL, HDL, LDLCALC, TRIG, CHOLHDL, LDLDIRECT in the last 72 hours. Thyroid Function Tests Recent Labs    09/05/20 0246  TSH 0.665    Other results:   Imaging     No results found.   Medications:     Scheduled Medications: . allopurinol  50 mg Oral Daily  . aspirin EC  81 mg Oral Daily  . busPIRone  7.5 mg Oral BID  . carvedilol  6.25 mg Oral BID WC  . clobetasol cream  1 application Topical BID  . clopidogrel  75 mg Oral Daily  .  enoxaparin (LOVENOX) injection  30 mg Subcutaneous Q24H  . furosemide  80 mg Intravenous BID  . guaiFENesin  1,200 mg Oral BID  . insulin aspart  0-15 Units Subcutaneous TID WC  . ivabradine  7.5 mg Oral BID WC  . levothyroxine  100 mcg Oral Once per day on Mon Tue Wed Thu Fri  . loratadine  10 mg Oral Daily  . oxymetazoline  1 spray Each Nare BID  . pravastatin  80 mg Oral q1800  . pregabalin  75 mg Oral Daily   And  . pregabalin  150 mg Oral QHS  . sildenafil  60 mg Oral TID  . sodium chloride flush  3 mL Intravenous Q12H     Infusions: . sodium chloride       PRN Medications:  sodium chloride, acetaminophen, albuterol, benzonatate, diazepam, diclofenac Sodium, fluticasone, HYDROcodone-acetaminophen, ondansetron (ZOFRAN) IV, sodium chloride flush   Assessment/Plan   1. A/C Systolic Heart Failure,  NICM dating back to 2016.   -ECHO 07/2020 EF 40-45% moderate RV function.  -Diuresing but remains fluid overloaded.  -Continue IV Lasix 80 mg bid -Scr stable/ improving, resume Home Farxiga 5 mg daily  -Continue corlanor 7.5 mg twice -Continue coreg 6.25 mg twice a day   2. AKI on CKD Stage IIIb - Creatinine baseline has been ~ 2.6  - Improving w/ diuresis, 2.7>2.5>2.3 - Restart home Farxiga - Follow BMP   3. 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  - Continue sildenafil 60 mg TID.  - At some point will need repeat RHC  4. OSA  - Uses dental appliance.   5. DMII  - On sliding scale - Restart home Farxiga   6. Hypothyroidism  -  TSH WNL - Continue levothyroxine   7. Hypokalemia - K 3.3 - supp w/ KCl. D.w PharmD  Length of Stay: 2  Lyda Jester, PA-C  09/05/2020, 11:28 AM  Advanced Heart Failure Team Pager [phone] (M-F; 7a - 5p)  Please contact [facility] for night-coverage after hours (5p -7a ) and weekends on [email or web address]  Patient seen and examined with the above-signed Advanced Practice Provider and/or Housestaff. I personally reviewed laboratory data, imaging studies and relevant notes. I independently examined the patient and formulated the important aspects of the plan. I have edited the note to reflect any of my changes or salient points. I have personally discussed the plan with the patient and/or family.  She is diuresing well with IV lasix. Breathing better. No orthopnea or PND. Creatinine improved.   General:  Lying in bed . No resp difficulty HEENT: normal Neck: supple. JVP 10 . Carotids 2+ bilat; no bruits. No lymphadenopathy or thryomegaly appreciated. Cor: PMI nondisplaced. Regular rate & rhythm. No rubs, gallops or murmurs. Lungs: clear Abdomen: obese soft, nontender, nondistended. No hepatosplenomegaly. No bruits or masses. Good bowel  sounds. Extremities: no cyanosis, clubbing, rash, tr  edema Neuro: alert & orientedx3, cranial nerves grossly intact. moves all 4 extremities w/o difficulty. Affect pleasant  Agree with above. Continue IV lasix. Supp K. Restart Wilder Glade. Baseline weight about 190. She has 10-15 pounds to go it appears. Place TED hose.  Glori Bickers, MD  12:05 PM

## 2020-09-05 NOTE — Progress Notes (Signed)
Patient experienced one episode of minimal drainage nose bleed, expressed that she has history of nosebleed, last episode  was 3 years ago, went to [facility] ED, MD gave her some type of nasal spray which stop the bleed. She is unsure which trigger this issue. Report to oncoming RN to notify MD during round. Patient denied any other distress. Will continue to monitor.

## 2020-09-05 NOTE — Progress Notes (Signed)
PROGRESS NOTE  Grace Bradford TDV:[id number] DOB: [birth year] DOA: 09/03/2020 PCP: Jani Gravel, MD   LOS: 2 days   Brief Narrative / Interim history: 75 year old female with history of chronic combined CHF, EF 40-45%, RV dysfunction, CKD stage IV with baseline creatinine around 2.6, DM 2, here with progressive shortness of breath and fluid buildup.  This has been going on for few weeks.  She saw heart failure clinic couple of weeks ago and at that time her reticulocyte dose was slightly increased.  Despite that she continued to feel more more short of breath and decided to come to the hospital.  Subjective / 24h Interval events: Feels sleepy this morning.  Appreciates that her breathing is better.  Had an episode of epistaxis earlier this morning  Assessment & Plan: Principal Problem Acute hypoxic respiratory failure due to acute on chronic systolic CHF with acute pulmonary edema -likely in the setting of fluid overload/CHF exacerbation.  She reports dietary compliance as well as compliance with her medications -Continue IV Lasix, daily weights, strict ins and outs. -Heart failure team consulted, appreciate input.  Renal function is stable, continue Lasix  Active Problems DM2-continue sliding scale  CBG (last 3)  Recent Labs    09/04/20 1611 09/04/20 2102 09/05/20 0611  GLUCAP 131* 127* 126*    Essential hypertension-continue Coreg, furosemide  Hypothyroidism-continue Synthroid  CAD-continue aspirin, Plavix, Coreg, Crestor  Chronic kidney disease stage IV-Baseline creatinine around 2.6 per cardiology notes.  Currently at baseline.  Monitor with diuresis  Scheduled Meds: . allopurinol  50 mg Oral Daily  . aspirin EC  81 mg Oral Daily  . busPIRone  7.5 mg Oral BID  . carvedilol  6.25 mg Oral BID WC  . clobetasol cream  1 application Topical BID  . clopidogrel  75 mg Oral Daily  . enoxaparin (LOVENOX) injection  30 mg Subcutaneous Q24H  . furosemide  80 mg Intravenous BID  .  guaiFENesin  1,200 mg Oral BID  . insulin aspart  0-15 Units Subcutaneous TID WC  . ivabradine  7.5 mg Oral BID WC  . levothyroxine  100 mcg Oral Once per day on Mon Tue Wed Thu Fri  . loratadine  10 mg Oral Daily  . oxymetazoline  1 spray Each Nare BID  . pravastatin  80 mg Oral q1800  . pregabalin  75 mg Oral Daily   And  . pregabalin  150 mg Oral QHS  . sildenafil  60 mg Oral TID  . sodium chloride flush  3 mL Intravenous Q12H   Continuous Infusions: . sodium chloride     PRN Meds:.sodium chloride, acetaminophen, albuterol, benzonatate, diazepam, diclofenac Sodium, fluticasone, HYDROcodone-acetaminophen, ondansetron (ZOFRAN) IV, sodium chloride flush  Diet Orders (From admission, onward)    Start     Ordered   09/03/20 2144  Diet heart healthy/carb modified Room service appropriate? Yes; Fluid consistency: Thin  Diet effective now       Question Answer Comment  Diet-HS Snack? Nothing   Room service appropriate? Yes   Fluid consistency: Thin      09/03/20 2143          DVT prophylaxis: enoxaparin (LOVENOX) injection 30 mg Start: 09/05/20 1215 SCDs Start: 09/03/20 2144     Code Status: Full Code  Family Communication: No family at bedside  Status is: Inpatient  Remains inpatient appropriate because:Inpatient level of care appropriate due to severity of illness   Dispo: The patient is from: Home  Anticipated d/c is to: Home              Patient currently is not medically stable to d/c.   Difficult to place patient No   Level of care: Telemetry Cardiac  Consultants:  Cardiology  Procedures:  none  Microbiology  none  Antimicrobials: none    Objective: Vitals:   09/04/20 1638 09/04/20 2232 09/05/20 0441 09/05/20 0500  BP: 126/67 140/76 (!) 116/59   Pulse: 82 77 77   Resp: 18 16 19    Temp: 97.8 F (36.6 C) 98.3 F (36.8 C) 98.7 F (37.1 C)   TempSrc: Oral Oral Oral   SpO2: 98% 100% 98%   Weight:    92.5 kg  Height:         Intake/Output Summary (Last 24 hours) at 09/05/2020 1028 Last data filed at 09/05/2020 0049 Gross per 24 hour  Intake 600 ml  Output 2200 ml  Net -1600 ml   Filed Weights   09/04/20 0107 09/04/20 0437 09/05/20 0500  Weight: 93.5 kg 93.1 kg 92.5 kg    Examination:  Constitutional: NAD Eyes: No icterus ENMT: mmm Neck: normal, supple Respiratory: Bibasilar crackles, no wheezing, normal respiratory effort Cardiovascular: Regular rate and rhythm, no murmurs, trace edema Abdomen: Soft, NT, ND, bowel sounds positive Musculoskeletal: no clubbing / cyanosis.  Skin: No rashes Neurologic: No focal deficits   Data Reviewed: I have independently reviewed following labs and imaging studies   CBC: Recent Labs  Lab 09/03/20 1511  WBC 7.4  NEUTROABS 4.5  HGB 9.0*  HCT 30.3*  MCV 81.0  PLT 035*   Basic Metabolic Panel: Recent Labs  Lab 09/03/20 1511 09/04/20 0102 09/05/20 0246  NA 141 142 142  K 4.6 4.4 3.3*  CL 107 107 103  CO2 25 25 29   GLUCOSE 231* 178* 236*  BUN 54* 53* 52*  CREATININE 2.69* 2.53* 2.34*  CALCIUM 7.6* 7.8* 8.1*   Liver Function Tests: Recent Labs  Lab 09/03/20 1511  AST 55*  ALT 58*  ALKPHOS 111  BILITOT 0.8  PROT 5.6*  ALBUMIN 2.8*   Coagulation Profile: No results for input(s): INR, PROTIME in the last 168 hours. HbA1C: Recent Labs    09/04/20 0102  HGBA1C 7.1*   CBG: Recent Labs  Lab 09/04/20 0559 09/04/20 1132 09/04/20 1611 09/04/20 2102 09/05/20 0611  GLUCAP 105* 142* 131* 127* 126*    Recent Results (from the past 240 hour(s))  Resp Panel by RT-PCR (Flu A&B, Covid) Nasopharyngeal Swab     Status: None   Collection Time: 09/03/20  4:37 PM   Specimen: Nasopharyngeal Swab; Nasopharyngeal(NP) swabs in vial transport medium  Result Value Ref Range Status   SARS Coronavirus 2 by RT PCR NEGATIVE NEGATIVE Final    Comment: (NOTE) SARS-CoV-2 target nucleic acids are NOT DETECTED.  The SARS-CoV-2 RNA is generally  detectable in upper respiratory specimens during the acute phase of infection. The lowest concentration of SARS-CoV-2 viral copies this assay can detect is 138 copies/mL. A negative result does not preclude SARS-Cov-2 infection and should not be used as the sole basis for treatment or other patient management decisions. A negative result may occur with  improper specimen collection/handling, submission of specimen other than nasopharyngeal swab, presence of viral mutation(s) within the areas targeted by this assay, and inadequate number of viral copies(<138 copies/mL). A negative result must be combined with clinical observations, patient history, and epidemiological information. The expected result is Negative.  Fact Sheet for Patients:  EntrepreneurPulse.com.au  Fact Sheet for Healthcare Providers:  IncredibleEmployment.be  This test is no t yet approved or cleared by the [place] FDA and  has been authorized for detection and/or diagnosis of SARS-CoV-2 by FDA under an Emergency Use Authorization (EUA). This EUA will remain  in effect (meaning this test can be used) for the duration of the COVID-19 declaration under Section 564(b)(1) of the Act, 21 U.S.C.section 360bbb-3(b)(1), unless the authorization is terminated  or revoked sooner.       Influenza A by PCR NEGATIVE NEGATIVE Final   Influenza B by PCR NEGATIVE NEGATIVE Final    Comment: (NOTE) The Xpert Xpress SARS-CoV-2/FLU/RSV plus assay is intended as an aid in the diagnosis of influenza from Nasopharyngeal swab specimens and should not be used as a sole basis for treatment. Nasal washings and aspirates are unacceptable for Xpert Xpress SARS-CoV-2/FLU/RSV testing.  Fact Sheet for Patients: EntrepreneurPulse.com.au  Fact Sheet for Healthcare Providers: IncredibleEmployment.be  This test is not yet approved or cleared by the [place] FDA  and has been authorized for detection and/or diagnosis of SARS-CoV-2 by FDA under an Emergency Use Authorization (EUA). This EUA will remain in effect (meaning this test can be used) for the duration of the COVID-19 declaration under Section 564(b)(1) of the Act, 21 U.S.C. section 360bbb-3(b)(1), unless the authorization is terminated or revoked.  Performed at [facility], [place] [address] [place],  [address]      Radiology Studies: No results found.  Marzetta Board, MD, PhD [facility]  Between 7 am - 7 pm I am available, please contact me via Amion (for emergencies) or Securechat (non urgent messages)  Between 7 pm - 7 am I am not available, please contact night coverage MD/APP via Amion

## 2020-09-06 DIAGNOSIS — I509 Heart failure, unspecified: Secondary | ICD-10-CM

## 2020-09-06 LAB — GLUCOSE, CAPILLARY
Glucose-Capillary: 162 mg/dL — ABNORMAL HIGH (ref 70–99)
Glucose-Capillary: 152 mg/dL — ABNORMAL HIGH (ref 70–99)

## 2020-09-06 LAB — BASIC METABOLIC PANEL
Anion gap: 10 (ref 5–15)
BUN: 52 mg/dL — ABNORMAL HIGH (ref 8–23)
CO2: 26 mmol/L (ref 22–32)
Calcium: 7.9 mg/dL — ABNORMAL LOW (ref 8.9–10.3)
Chloride: 104 mmol/L (ref 98–111)
Creatinine, Ser: 2.18 mg/dL — ABNORMAL HIGH (ref 0.44–1.00)
GFR, Estimated: 23 mL/min — ABNORMAL LOW (ref >60)
Glucose, Bld: 171 mg/dL — ABNORMAL HIGH (ref 70–99)
Potassium: 3.7 mmol/L (ref 3.5–5.1)
Sodium: 140 mmol/L (ref 135–145)

## 2020-09-06 MED ORDER — TORSEMIDE 100 MG PO TABS: 50.0000 mg | ORAL_TABLET | Freq: Every day | ORAL | 0 refills | Status: DC

## 2020-09-06 NOTE — Progress Notes (Signed)
Advanced Heart Failure Rounding Note  PCP-Cardiologist: None   Subjective:    Admitted w/ a/c CHF w/ fluid overload.   Continues to diurese with IV lasix. Weight down 15 more pounds. Creatinine down to 2.18  Breathing better. No SOB, orthopnea or PND.    Objective:   Weight Range: 85.7 kg Body mass index is 35.7 kg/m.   Vital Signs:   Temp:  [98.4 F (36.9 C)-98.6 F (37 C)] 98.4 F (36.9 C) (05/14 0535) Pulse Rate:  [68-79] 79 (05/14 0535) Resp:  [16-18] 18 (05/14 0535) BP: (91-141)/(53-85) 141/85 (05/14 0535) SpO2:  [98 %-100 %] 98 % (05/14 0535) Weight:  [85.7 kg] 85.7 kg (05/14 0529) Last BM Date: 09/06/20  Weight change: Filed Weights   09/04/20 0437 09/05/20 0500 09/06/20 0529  Weight: 93.1 kg 92.5 kg 85.7 kg    Intake/Output:   Intake/Output Summary (Last 24 hours) at 09/06/2020 1101 Last data filed at 09/06/2020 0523 Gross per 24 hour  Intake 240 ml  Output 2200 ml  Net -1960 ml      Physical Exam    General:  Well appearing. No resp difficulty HEENT: normal Neck: supple. no JVD. Carotids 2+ bilat; no bruits. No lymphadenopathy or thryomegaly appreciated. Cor: PMI nondisplaced. Regular rate & rhythm. No rubs, gallops or murmurs. Lungs: clear Abdomen: soft, nontender, nondistended. No hepatosplenomegaly. No bruits or masses. Good bowel sounds. Extremities: no cyanosis, clubbing, rash, edema Neuro: alert & orientedx3, cranial nerves grossly intact. moves all 4 extremities w/o difficulty. Affect pleasant  Telemetry   NSR 70-80 Personally reviewed  Labs    CBC Recent Labs    09/03/20 1511  WBC 7.4  NEUTROABS 4.5  HGB 9.0*  HCT 30.3*  MCV 81.0  PLT 709*   Basic Metabolic Panel Recent Labs    09/05/20 0246 09/06/20 0505  NA 142 140  K 3.3* 3.7  CL 103 104  CO2 29 26  GLUCOSE 236* 171*  BUN 52* 52*  CREATININE 2.34* 2.18*  CALCIUM 8.1* 7.9*   Liver Function Tests Recent Labs    09/03/20 1511  AST 55*  ALT 58*   ALKPHOS 111  BILITOT 0.8  PROT 5.6*  ALBUMIN 2.8*   No results for input(s): LIPASE, AMYLASE in the last 72 hours. Cardiac Enzymes No results for input(s): CKTOTAL, CKMB, CKMBINDEX, TROPONINI in the last 72 hours.  BNP: BNP (last 3 results) Recent Labs    06/04/20 1530 09/03/20 1511  BNP 202.4* 1,286.7*    ProBNP (last 3 results) No results for input(s): PROBNP in the last 8760 hours.   D-Dimer No results for input(s): DDIMER in the last 72 hours. Hemoglobin A1C Recent Labs    09/04/20 0102  HGBA1C 7.1*   Fasting Lipid Panel No results for input(s): CHOL, HDL, LDLCALC, TRIG, CHOLHDL, LDLDIRECT in the last 72 hours. Thyroid Function Tests Recent Labs    09/05/20 0246  TSH 0.665    Other results:   Imaging    No results found.   Medications:     Scheduled Medications: . allopurinol  50 mg Oral Daily  . aspirin EC  81 mg Oral Daily  . busPIRone  7.5 mg Oral BID  . carvedilol  6.25 mg Oral BID WC  . clobetasol cream  1 application Topical BID  . clopidogrel  75 mg Oral Daily  . dapagliflozin propanediol  5 mg Oral Daily  . enoxaparin (LOVENOX) injection  30 mg Subcutaneous Q24H  . furosemide  80 mg Intravenous  BID  . guaiFENesin  1,200 mg Oral BID  . insulin aspart  0-15 Units Subcutaneous TID WC  . ivabradine  7.5 mg Oral BID WC  . levothyroxine  100 mcg Oral Once per day on Mon Tue Wed Thu Fri  . loratadine  10 mg Oral Daily  . oxymetazoline  1 spray Each Nare BID  . pravastatin  80 mg Oral q1800  . pregabalin  75 mg Oral Daily   And  . pregabalin  150 mg Oral QHS  . sildenafil  60 mg Oral TID  . sodium chloride flush  3 mL Intravenous Q12H    Infusions: . sodium chloride      PRN Medications: sodium chloride, acetaminophen, albuterol, benzonatate, diazepam, diclofenac Sodium, fluticasone, HYDROcodone-acetaminophen, ondansetron (ZOFRAN) IV, sodium chloride flush   Assessment/Plan   1. A/C Systolic Heart Failure, NICM dating back to  2016.   -ECHO 07/2020 EF 40-45% moderate RV function. +LBBB -She has diuresed well. Back down to baseline weight  - Continue Farxiga 5 mg daily  -Continue corlanor 7.5 mg twice -Continue coreg 6.25 mg twice a day  - I think she can go home today from HF perspective. - would use torsemide 50mg  daily at home with close f/u of volume status and renal function    2. AKI on CKD Stage IIIb - Creatinine baseline has been ~ 2.6  - Improving w/ diuresis, 2.7>2.5>2.3 > 2.18 -Continue home Farxiga  3. 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  - Continue sildenafil 60 mg TID.   4. OSA  - Uses dental appliance.   5. DMII  - On sliding scale - Continue home Farxiga   6. Hypokalemia - K 3.7 - supp w/ KCl. D.w PharmD  Length of Stay: 3  Glori Bickers, MD  09/06/2020, 11:01 AM  Advanced Heart Failure Team Pager [phone] (M-F; [place])  Please contact [facility] for night-coverage after hours (5p -7a ) and weekends on [email or web address]

## 2020-09-06 NOTE — Discharge Summary (Signed)
Physician Discharge Summary  DAWNN NAM UUV:[id number] DOB: Aug 02, 1945 DOA: 09/03/2020  PCP: Jani Gravel, MD  Admit date: 09/03/2020 Discharge date: 09/06/2020  Admitted From: home Disposition:  home  Recommendations for Outpatient Follow-up:  1. Follow up with PCP in 1-2 weeks 2. Please obtain BMP/CBC in one week 3. Follow-up with cardiology as scheduled  Home Health: none Equipment/Devices: none  Discharge Condition: stable CODE STATUS: Full code Diet recommendation: low sodium heart healthy   HPI: Per admitting MD, Grace Bradford is a 75 y.o. female seen in ed with complaints of SOB, lower extremity edema, wheezing and generalized weakness.  Chart review shows patient was seen by cardiology provider middle of April and had an echocardiogram results as below.  Patient wants called by her cardiologist and advised to increase her diuretic therapy that patient is taking, Lasix and still has persistent shortness of breath that has been progressive over the past 3 days. Pt has past medical history of congestive heart failure, chronic kidney disease, history of CVA, hypertension, GERD, gout.  Hospital Course / Discharge diagnoses: Principal Problem Acute hypoxic respiratory failure due to acute on chronic systolic CHF with acute pulmonary edema -likely in the setting of fluid overload/CHF exacerbation.  She reports dietary compliance as well as compliance with her medications.  Cardiology was consulted and followed patient while hospitalized.  She was placed on IV diuresis and her weight is improved from 206 pounds on admission to 188 pounds on discharge.  This is her baseline weight, cleared for discharge by cardiology, heart home diuretics will be changed to 50 mg of torsemide daily per cardiology.  She will have follow-up as an outpatient  Active Problems DM2-continue home regimen on discharge Essential hypertension-continue home regimen on discharge Hypothyroidism-continue  Synthroid CAD-continue aspirin, Plavix, Coreg, Crestor Chronic kidney disease stage IV-Baseline creatinine around 2.6, she did well with diuresis, creatinine has remained stable in fact improved better than her baseline and is at 2.1 on discharge.   Discharge Instructions   Allergies as of 09/06/2020      Reactions   Rocephin [ceftriaxone Sodium In Dextrose] Itching   Tolerated cefdinir 07/2017   Ace Inhibitors Cough      Medication List    TAKE these medications   acetaminophen 500 MG tablet Commonly known as: TYLENOL Take 1,000 mg by mouth every 6 (six) hours as needed for mild pain or moderate pain.   albuterol 108 (90 Base) MCG/ACT inhaler Commonly known as: VENTOLIN HFA Inhale 2 puffs into the lungs every 6 (six) hours as needed for wheezing or shortness of breath.   albuterol (2.5 MG/3ML) 0.083% nebulizer solution Commonly known as: PROVENTIL Inhale 3 mLs into the lungs every 4 (four) hours as needed for shortness of breath.   allopurinol 100 MG tablet Commonly known as: ZYLOPRIM Take 50 mg by mouth daily.   ARNICA EX Apply 1 application topically 2 (two) times daily as needed (pain).   aspirin EC 81 MG tablet Take 81 mg by mouth daily.   benzonatate 200 MG capsule Commonly known as: TESSALON Take 1 capsule (200 mg total) by mouth 3 (three) times daily as needed for cough.   busPIRone 7.5 MG tablet Commonly known as: BUSPAR Take 7.5 mg by mouth 2 (two) times daily.   carvedilol 6.25 MG tablet Commonly known as: COREG Take 1 tablet (6.25 mg total) by mouth 2 (two) times daily with a meal.   Casc[place] 450 MG Caps Take 450 mg by mouth 2 (two)  times daily.   clobetasol cream 0.05 % Commonly known as: TEMOVATE Apply 1 application topically 2 (two) times daily.   clopidogrel 75 MG tablet Commonly known as: PLAVIX Take 75 mg by mouth daily.   Corlanor 7.5 MG Tabs tablet Generic drug: ivabradine TAKE 1 TABLET(7.5 MG) BY MOUTH TWICE DAILY WITH A  MEAL What changed: See the new instructions.   dapagliflozin propanediol 5 MG Tabs tablet Commonly known as: Farxiga Take 1 tablet (5 mg total) by mouth daily before breakfast.   diazepam 5 MG tablet Commonly known as: VALIUM Take 1 tablet (5 mg total) by mouth every 12 (twelve) hours as needed for muscle spasms.   diclofenac Sodium 1 % Gel Commonly known as: VOLTAREN Apply 2 g topically 4 (four) times daily as needed (pain).   fexofenadine 180 MG tablet Commonly known as: ALLEGRA Take 180 mg by mouth daily.   fluticasone 50 MCG/ACT nasal spray Commonly known as: FLONASE Place 2 sprays into both nostrils 2 (two) times daily as needed for allergies.   Ginger Root 500 MG Caps Take 500 mg by mouth daily.   guaiFENesin 600 MG 12 hr tablet Commonly known as: MUCINEX Take 2 tablets (1,200 mg total) by mouth 2 (two) times daily.   HYDROcodone-acetaminophen 10-325 MG tablet Commonly known as: NORCO Take 1 tablet by mouth every 6 (six) hours as needed for moderate pain.   hydrOXYzine 25 MG tablet Commonly known as: ATARAX/VISTARIL Take 1 tablet (25 mg total) by mouth 4 (four) times daily. What changed:   how much to take  when to take this   Levemir FlexTouch 100 UNIT/ML FlexPen Generic drug: insulin detemir Inject 10 Units into the skin daily.   levothyroxine 100 MCG tablet Commonly known as: SYNTHROID Take 100 mcg by mouth See admin instructions. Pt takes Monday through Friday and does not take on the weekends   lidocaine 5 % ointment Commonly known as: XYLOCAINE Apply 1 application topically 2 (two) times daily as needed for pain.   metaxalone 800 MG tablet Commonly known as: SKELAXIN Take 800 mg by mouth 3 (three) times daily.   multivitamin with minerals Tabs tablet Take 1 tablet by mouth daily.   omeprazole 40 MG capsule Commonly known as: PRILOSEC Take 40 mg by mouth daily.   ondansetron 4 MG tablet Commonly known as: ZOFRAN Take 4 mg by mouth every 8  (eight) hours as needed for vomiting or nausea.   pantoprazole 40 MG tablet Commonly known as: PROTONIX Take 1 tablet (40 mg total) by mouth 2 (two) times daily.   potassium chloride SA 20 MEQ tablet Commonly known as: KLOR-CON Take 20 mEq by mouth 2 (two) times daily.   pravastatin 80 MG tablet Commonly known as: PRAVACHOL Take 80 mg by mouth Daily.   pregabalin 75 MG capsule Commonly known as: LYRICA Take 75-150 mg by mouth See admin instructions. 75 mg in the am and 150 mg at bedtimes   PROBIOTIC PO Take 1 capsule by mouth daily.   sildenafil 20 MG tablet Commonly known as: REVATIO TAKE 3 TABLETS (60 MG) BY MOUTH THREE TIMES DAILY What changed: See the new instructions.   Soothe 0.6-0.6 % Soln Generic drug: Propylene Glycol-Glycerin Place 1 drop into both eyes daily as needed (dry eyes).   torsemide 100 MG tablet Commonly known as: DEMADEX Take 0.5 tablets (50 mg total) by mouth daily. And  as needed for weight gain > 233 What changed: when to take this   Trulicity 1.5 OQ/9.4TM  Sopn Generic drug: Dulaglutide Inject 1.5 mg into the skin once a week. Thursday       Consultations:  Cardiology-heart failure  Procedures/Studies:  None  DG Chest 2 View  Result Date: 09/03/2020 CLINICAL DATA:  Shortness of breath and wheezing History of heart failure EXAM: CHEST - 2 VIEW COMPARISON:  07/11/2019 FINDINGS: Unchanged cardiomegaly. Pulmonary vascular congestion again seen. Lungs otherwise clear. IMPRESSION: Findings most consistent with CHF. Electronically Signed   By: Miachel Roux M.D.   On: 09/03/2020 15:56      Subjective: Doing well this morning, no shortness of breath, feels much better, able to ambulate without significant difficulties  Discharge Exam: BP (!) 141/85 (BP Location: Right Arm)   Pulse 79   Temp 98.4 F (36.9 C) (Oral)   Resp 18   Ht 5\' 1"  (1.549 m)   Wt 85.7 kg   SpO2 98%   BMI 35.70 kg/m   General: Pt is alert, awake, not in acute  distress Cardiovascular: RRR, S1/S2 +, no rubs, no gallops Respiratory: CTA bilaterally, no wheezing, no rhonchi Abdominal: Soft, NT, ND, bowel sounds + Extremities: no edema, no cyanosis    The results of significant diagnostics from this hospitalization (including imaging, microbiology, ancillary and laboratory) are listed below for reference.     Microbiology: Recent Results (from the past 240 hour(s))  Resp Panel by RT-PCR (Flu A&B, Covid) Nasopharyngeal Swab     Status: None   Collection Time: 09/03/20  4:37 PM   Specimen: Nasopharyngeal Swab; Nasopharyngeal(NP) swabs in vial transport medium  Result Value Ref Range Status   SARS Coronavirus 2 by RT PCR NEGATIVE NEGATIVE Final    Comment: (NOTE) SARS-CoV-2 target nucleic acids are NOT DETECTED.  The SARS-CoV-2 RNA is generally detectable in upper respiratory specimens during the acute phase of infection. The lowest concentration of SARS-CoV-2 viral copies this assay can detect is 138 copies/mL. A negative result does not preclude SARS-Cov-2 infection and should not be used as the sole basis for treatment or other patient management decisions. A negative result may occur with  improper specimen collection/handling, submission of specimen other than nasopharyngeal swab, presence of viral mutation(s) within the areas targeted by this assay, and inadequate number of viral copies(<138 copies/mL). A negative result must be combined with clinical observations, patient history, and epidemiological information. The expected result is Negative.  Fact Sheet for Patients:  EntrepreneurPulse.com.au  Fact Sheet for Healthcare Providers:  IncredibleEmployment.be  This test is no t yet approved or cleared by the [place] FDA and  has been authorized for detection and/or diagnosis of SARS-CoV-2 by FDA under an Emergency Use Authorization (EUA). This EUA will remain  in effect (meaning this test  can be used) for the duration of the COVID-19 declaration under Section 564(b)(1) of the Act, 21 U.S.C.section 360bbb-3(b)(1), unless the authorization is terminated  or revoked sooner.       Influenza A by PCR NEGATIVE NEGATIVE Final   Influenza B by PCR NEGATIVE NEGATIVE Final    Comment: (NOTE) The Xpert Xpress SARS-CoV-2/FLU/RSV plus assay is intended as an aid in the diagnosis of influenza from Nasopharyngeal swab specimens and should not be used as a sole basis for treatment. Nasal washings and aspirates are unacceptable for Xpert Xpress SARS-CoV-2/FLU/RSV testing.  Fact Sheet for Patients: EntrepreneurPulse.com.au  Fact Sheet for Healthcare Providers: IncredibleEmployment.be  This test is not yet approved or cleared by the [place] FDA and has been authorized for detection and/or diagnosis of SARS-CoV-2 by FDA  under an Emergency Use Authorization (EUA). This EUA will remain in effect (meaning this test can be used) for the duration of the COVID-19 declaration under Section 564(b)(1) of the Act, 21 U.S.C. section 360bbb-3(b)(1), unless the authorization is terminated or revoked.  Performed at [facility], [place] [address] [place], [place] [address]      Labs: Basic Metabolic Panel: Recent Labs  Lab 09/03/20 1511 09/04/20 0102 09/05/20 0246 09/06/20 0505  NA 141 142 142 140  K 4.6 4.4 3.3* 3.7  CL 107 107 103 104  CO2 25 25 29 26   GLUCOSE 231* 178* 236* 171*  BUN 54* 53* 52* 52*  CREATININE 2.69* 2.53* 2.34* 2.18*  CALCIUM 7.6* 7.8* 8.1* 7.9*   Liver Function Tests: Recent Labs  Lab 09/03/20 1511  AST 55*  ALT 58*  ALKPHOS 111  BILITOT 0.8  PROT 5.6*  ALBUMIN 2.8*   CBC: Recent Labs  Lab 09/03/20 1511  WBC 7.4  NEUTROABS 4.5  HGB 9.0*  HCT 30.3*  MCV 81.0  PLT 121*   CBG: Recent Labs  Lab 09/05/20 1107 09/05/20 1602 09/05/20 2119 09/06/20 0556 09/06/20 1104  GLUCAP 136* 150* 160* 162*  152*   Hgb A1c Recent Labs    09/04/20 0102  HGBA1C 7.1*   Lipid Profile No results for input(s): CHOL, HDL, LDLCALC, TRIG, CHOLHDL, LDLDIRECT in the last 72 hours. Thyroid function studies Recent Labs    09/05/20 0246  TSH 0.665   Urinalysis    Component Value Date/Time   COLORURINE STRAW (A) 07/24/2017 1503   APPEARANCEUR CLEAR 07/24/2017 1503   LABSPEC 1.011 07/24/2017 1503   PHURINE 6.0 07/24/2017 1503   GLUCOSEU NEGATIVE 07/24/2017 1503   HGBUR NEGATIVE 07/24/2017 1503   BILIRUBINUR NEGATIVE 07/24/2017 1503   KETONESUR NEGATIVE 07/24/2017 1503   PROTEINUR NEGATIVE 07/24/2017 1503   UROBILINOGEN 0.2 10/17/2014 2232   NITRITE NEGATIVE 07/24/2017 1503   LEUKOCYTESUR TRACE (A) 07/24/2017 1503    FURTHER DISCHARGE INSTRUCTIONS:   Get Medicines reviewed and adjusted: Please take all your medications with you for your next visit with your Primary MD   Laboratory/radiological data: Please request your Primary MD to go over all hospital tests and procedure/radiological results at the follow up, please ask your Primary MD to get all Hospital records sent to his/her office.   In some cases, they will be blood work, cultures and biopsy results pending at the time of your discharge. Please request that your primary care M.D. goes through all the records of your hospital data and follows up on these results.   Also Note the following: If you experience worsening of your admission symptoms, develop shortness of breath, life threatening emergency, suicidal or homicidal thoughts you must seek medical attention immediately by calling 911 or calling your MD immediately  if symptoms less severe.   You must read complete instructions/literature along with all the possible adverse reactions/side effects for all the Medicines you take and that have been prescribed to you. Take any new Medicines after you have completely understood and accpet all the possible adverse reactions/side  effects.    Do not drive when taking Pain medications or sleeping medications (Benzodaizepines)   Do not take more than prescribed Pain, Sleep and Anxiety Medications. It is not advisable to combine anxiety,sleep and pain medications without talking with your primary care practitioner   Special Instructions: If you have smoked or chewed Tobacco  in the last 2 yrs please stop smoking, stop any regular Alcohol  and or any Recreational drug use.   Wear Seat belts while driving.   Please note: You were cared for by a hospitalist during your hospital stay. Once you are discharged, your primary care physician will handle any further medical issues. Please note that NO REFILLS for any discharge medications will be authorized once you are discharged, as it is imperative that you return to your primary care physician (or establish a relationship with a primary care physician if you do not have one) for your post hospital discharge needs so that they can reassess your need for medications and monitor your lab values.  Time coordinating discharge: 35 minutes  SIGNED:  Marzetta Board, MD, PhD 09/06/2020, 11:24 AM

## 2020-09-09 ENCOUNTER — Telehealth (HOSPITAL_COMMUNITY): Payer: Self-pay | Admitting: *Deleted

## 2020-09-09 NOTE — Telephone Encounter (Signed)
Pt left VM stating her weight was up from her hospital discharge. I called back to get more information no answer/left vm for return call.

## 2020-09-15 DIAGNOSIS — G894 Chronic pain syndrome: Secondary | ICD-10-CM | POA: Diagnosis not present

## 2020-09-15 DIAGNOSIS — M5136 Other intervertebral disc degeneration, lumbar region: Secondary | ICD-10-CM | POA: Diagnosis not present

## 2020-09-15 DIAGNOSIS — M5126 Other intervertebral disc displacement, lumbar region: Secondary | ICD-10-CM | POA: Diagnosis not present

## 2020-09-15 DIAGNOSIS — Z79899 Other long term (current) drug therapy: Secondary | ICD-10-CM | POA: Diagnosis not present

## 2020-09-15 DIAGNOSIS — Z79891 Long term (current) use of opiate analgesic: Secondary | ICD-10-CM | POA: Diagnosis not present

## 2020-09-15 DIAGNOSIS — M47816 Spondylosis without myelopathy or radiculopathy, lumbar region: Secondary | ICD-10-CM | POA: Diagnosis not present

## 2020-09-16 DIAGNOSIS — L281 Prurigo nodularis: Secondary | ICD-10-CM | POA: Diagnosis not present

## 2020-09-16 DIAGNOSIS — L821 Other seborrheic keratosis: Secondary | ICD-10-CM | POA: Diagnosis not present

## 2020-09-17 DIAGNOSIS — I5042 Chronic combined systolic (congestive) and diastolic (congestive) heart failure: Secondary | ICD-10-CM | POA: Diagnosis not present

## 2020-09-17 DIAGNOSIS — I2721 Secondary pulmonary arterial hypertension: Secondary | ICD-10-CM | POA: Diagnosis not present

## 2020-09-17 DIAGNOSIS — N184 Chronic kidney disease, stage 4 (severe): Secondary | ICD-10-CM | POA: Diagnosis not present

## 2020-09-17 DIAGNOSIS — I129 Hypertensive chronic kidney disease with stage 1 through stage 4 chronic kidney disease, or unspecified chronic kidney disease: Secondary | ICD-10-CM | POA: Diagnosis not present

## 2020-09-17 DIAGNOSIS — E1122 Type 2 diabetes mellitus with diabetic chronic kidney disease: Secondary | ICD-10-CM | POA: Diagnosis not present

## 2020-09-23 DIAGNOSIS — I130 Hypertensive heart and chronic kidney disease with heart failure and stage 1 through stage 4 chronic kidney disease, or unspecified chronic kidney disease: Secondary | ICD-10-CM | POA: Diagnosis not present

## 2020-09-23 DIAGNOSIS — E1122 Type 2 diabetes mellitus with diabetic chronic kidney disease: Secondary | ICD-10-CM | POA: Diagnosis not present

## 2020-09-23 DIAGNOSIS — I5042 Chronic combined systolic (congestive) and diastolic (congestive) heart failure: Secondary | ICD-10-CM | POA: Diagnosis not present

## 2020-09-23 DIAGNOSIS — N184 Chronic kidney disease, stage 4 (severe): Secondary | ICD-10-CM | POA: Diagnosis not present

## 2020-09-24 ENCOUNTER — Other Ambulatory Visit: Payer: Self-pay

## 2020-09-24 ENCOUNTER — Encounter (HOSPITAL_COMMUNITY): Payer: Self-pay | Admitting: Internal Medicine

## 2020-09-24 ENCOUNTER — Ambulatory Visit (HOSPITAL_COMMUNITY)
Admission: RE | Admit: 2020-09-24 | Discharge: 2020-09-24 | Disposition: A | Discharge status: Home / Self Care | Payer: Medicare Other | Source: Ambulatory Visit | Attending: Internal Medicine | Admitting: Internal Medicine

## 2020-09-24 VITALS — BP 104/70 | HR 69 | Wt 208.2 lb

## 2020-09-24 DIAGNOSIS — Z794 Long term (current) use of insulin: Secondary | ICD-10-CM | POA: Diagnosis not present

## 2020-09-24 DIAGNOSIS — I428 Other cardiomyopathies: Secondary | ICD-10-CM | POA: Insufficient documentation

## 2020-09-24 DIAGNOSIS — I5023 Acute on chronic systolic (congestive) heart failure: Secondary | ICD-10-CM | POA: Diagnosis not present

## 2020-09-24 DIAGNOSIS — Z8249 Family history of ischemic heart disease and other diseases of the circulatory system: Secondary | ICD-10-CM | POA: Diagnosis not present

## 2020-09-24 DIAGNOSIS — E1122 Type 2 diabetes mellitus with diabetic chronic kidney disease: Secondary | ICD-10-CM | POA: Diagnosis not present

## 2020-09-24 DIAGNOSIS — I252 Old myocardial infarction: Secondary | ICD-10-CM | POA: Diagnosis not present

## 2020-09-24 DIAGNOSIS — Z79899 Other long term (current) drug therapy: Secondary | ICD-10-CM | POA: Diagnosis not present

## 2020-09-24 DIAGNOSIS — Z7982 Long term (current) use of aspirin: Secondary | ICD-10-CM | POA: Insufficient documentation

## 2020-09-24 DIAGNOSIS — I2510 Atherosclerotic heart disease of native coronary artery without angina pectoris: Secondary | ICD-10-CM | POA: Insufficient documentation

## 2020-09-24 DIAGNOSIS — I130 Hypertensive heart and chronic kidney disease with heart failure and stage 1 through stage 4 chronic kidney disease, or unspecified chronic kidney disease: Secondary | ICD-10-CM | POA: Insufficient documentation

## 2020-09-24 DIAGNOSIS — Z6839 Body mass index (BMI) 39.0-39.9, adult: Secondary | ICD-10-CM | POA: Insufficient documentation

## 2020-09-24 DIAGNOSIS — N184 Chronic kidney disease, stage 4 (severe): Secondary | ICD-10-CM | POA: Diagnosis not present

## 2020-09-24 DIAGNOSIS — E669 Obesity, unspecified: Secondary | ICD-10-CM | POA: Diagnosis not present

## 2020-09-24 DIAGNOSIS — G4733 Obstructive sleep apnea (adult) (pediatric): Secondary | ICD-10-CM | POA: Diagnosis not present

## 2020-09-24 DIAGNOSIS — I5032 Chronic diastolic (congestive) heart failure: Secondary | ICD-10-CM | POA: Diagnosis not present

## 2020-09-24 MED ORDER — METOLAZONE 2.5 MG PO TABS: ORAL_TABLET | ORAL | 3 refills | Status: DC

## 2020-09-24 MED ORDER — TORSEMIDE 100 MG PO TABS: ORAL_TABLET | ORAL | 3 refills | Status: AC
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
## 2020-09-24 NOTE — Patient Instructions (Signed)
Labs done today. We will contact you only if your labs are abnormal.  START Metolazone 1 tablet by mouth today and tomorrow ONLY  TAKE AN EXTRA 2 TABLETS OF POTASSIUM TODAY AND TOMORROW  INCREASE Torsemide to 100mg  (1 tablet) by mouth every morning and 50mg  (1/2 tablet) by mouth every evening.  No other medication changes were made. Please continue all current medications as prescribed.  IF WEIGHT HAS NOT DECREASED BY THIS Friday PLEASE CONTACT OUR OFFICE.  Your physician recommends that you schedule a follow-up appointment in: 1 week with our [facility] here in our office.    If you have any questions or concerns before your next appointment please send [place] a message through [place] or call our office at [phone].    TO LEAVE A MESSAGE FOR THE NURSE SELECT OPTION 2, PLEASE LEAVE A MESSAGE INCLUDING: . YOUR NAME . DATE OF BIRTH . CALL BACK NUMBER . REASON FOR CALL**this is important as we prioritize the call backs  YOU WILL RECEIVE A CALL BACK THE SAME DAY AS LONG AS YOU CALL BEFORE 4:00 PM   Do the following things EVERYDAY: 1) Weigh yourself in the morning before breakfast. Write it down and keep it in a log. 2) Take your medicines as prescribed 3) Eat low salt foods--Limit salt (sodium) to 2000 mg per day.  4) Stay as active as you can everyday 5) Limit all fluids for the day to less than 2 liters   At the [facility], you and your health needs are our priority. As part of our continuing mission to provide you with exceptional heart care, we have created designated Provider Care Teams. These Care Teams include your primary Cardiologist (physician) and Advanced Practice Providers (APPs- Physician Assistants and Nurse Practitioners) who all work together to provide you with the care you need, when you need it.   You may see any of the following providers on your designated Care Team at your next follow up: Marland Kitchen Dr Glori Bickers . Dr Loralie Champagne . Darrick Grinder, NP . Lyda Jester, PA . Audry Riles, PharmD   Please be sure to bring in all your medications bottles to every appointment.

## 2020-09-25 DIAGNOSIS — M150 Primary generalized (osteo)arthritis: Secondary | ICD-10-CM | POA: Diagnosis not present

## 2020-09-25 DIAGNOSIS — I129 Hypertensive chronic kidney disease with stage 1 through stage 4 chronic kidney disease, or unspecified chronic kidney disease: Secondary | ICD-10-CM | POA: Diagnosis not present

## 2020-09-25 DIAGNOSIS — Z0000 Encounter for general adult medical examination without abnormal findings: Secondary | ICD-10-CM | POA: Diagnosis not present

## 2020-09-25 DIAGNOSIS — E1129 Type 2 diabetes mellitus with other diabetic kidney complication: Secondary | ICD-10-CM | POA: Diagnosis not present

## 2020-09-25 DIAGNOSIS — E1159 Type 2 diabetes mellitus with other circulatory complications: Secondary | ICD-10-CM | POA: Diagnosis not present

## 2020-09-25 DIAGNOSIS — I5022 Chronic systolic (congestive) heart failure: Secondary | ICD-10-CM | POA: Diagnosis not present

## 2020-09-25 DIAGNOSIS — E7800 Pure hypercholesterolemia, unspecified: Secondary | ICD-10-CM | POA: Diagnosis not present

## 2020-09-25 DIAGNOSIS — I509 Heart failure, unspecified: Secondary | ICD-10-CM | POA: Diagnosis not present

## 2020-09-25 DIAGNOSIS — M1A09X0 Idiopathic chronic gout, multiple sites, without tophus (tophi): Secondary | ICD-10-CM | POA: Diagnosis not present

## 2020-09-25 LAB — BASIC METABOLIC PANEL
Anion gap: 15 (ref 5–15)
BUN: 62 mg/dL — ABNORMAL HIGH (ref 8–23)
CO2: 25 mmol/L (ref 22–32)
Calcium: 7.2 mg/dL — ABNORMAL LOW (ref 8.9–10.3)
Chloride: 104 mmol/L (ref 98–111)
Creatinine, Ser: 2.7 mg/dL — ABNORMAL HIGH (ref 0.44–1.00)
GFR, Estimated: 18 mL/min — ABNORMAL LOW (ref >60)
Glucose, Bld: 92 mg/dL (ref 70–99)
Potassium: 3.7 mmol/L (ref 3.5–5.1)
Sodium: 144 mmol/L (ref 135–145)

## 2020-09-25 LAB — BRAIN NATRIURETIC PEPTIDE: B Natriuretic Peptide: 1752.8 pg/mL — ABNORMAL HIGH (ref 0.0–100.0)

## 2020-09-30 ENCOUNTER — Other Ambulatory Visit (HOSPITAL_COMMUNITY): Payer: Self-pay | Admitting: Internal Medicine

## 2020-10-02 ENCOUNTER — Encounter (HOSPITAL_COMMUNITY): Payer: Self-pay

## 2020-10-02 ENCOUNTER — Other Ambulatory Visit: Payer: Self-pay

## 2020-10-02 ENCOUNTER — Ambulatory Visit (HOSPITAL_COMMUNITY)
Admission: RE | Admit: 2020-10-02 | Discharge: 2020-10-02 | Disposition: A | Discharge status: Home / Self Care | Payer: Medicare Other | Source: Ambulatory Visit | Attending: Cardiology | Admitting: Cardiology

## 2020-10-02 VITALS — BP 140/66 | HR 64 | Wt 207.8 lb

## 2020-10-02 DIAGNOSIS — Z7902 Long term (current) use of antithrombotics/antiplatelets: Secondary | ICD-10-CM | POA: Diagnosis not present

## 2020-10-02 DIAGNOSIS — I252 Old myocardial infarction: Secondary | ICD-10-CM | POA: Insufficient documentation

## 2020-10-02 DIAGNOSIS — I5032 Chronic diastolic (congestive) heart failure: Secondary | ICD-10-CM | POA: Diagnosis not present

## 2020-10-02 DIAGNOSIS — E1140 Type 2 diabetes mellitus with diabetic neuropathy, unspecified: Secondary | ICD-10-CM | POA: Diagnosis not present

## 2020-10-02 DIAGNOSIS — I130 Hypertensive heart and chronic kidney disease with heart failure and stage 1 through stage 4 chronic kidney disease, or unspecified chronic kidney disease: Secondary | ICD-10-CM | POA: Diagnosis not present

## 2020-10-02 DIAGNOSIS — Z7982 Long term (current) use of aspirin: Secondary | ICD-10-CM | POA: Diagnosis not present

## 2020-10-02 DIAGNOSIS — Z79899 Other long term (current) drug therapy: Secondary | ICD-10-CM | POA: Diagnosis not present

## 2020-10-02 DIAGNOSIS — E1122 Type 2 diabetes mellitus with diabetic chronic kidney disease: Secondary | ICD-10-CM | POA: Insufficient documentation

## 2020-10-02 DIAGNOSIS — Z8249 Family history of ischemic heart disease and other diseases of the circulatory system: Secondary | ICD-10-CM | POA: Insufficient documentation

## 2020-10-02 DIAGNOSIS — I2510 Atherosclerotic heart disease of native coronary artery without angina pectoris: Secondary | ICD-10-CM | POA: Diagnosis not present

## 2020-10-02 DIAGNOSIS — I255 Ischemic cardiomyopathy: Secondary | ICD-10-CM | POA: Insufficient documentation

## 2020-10-02 DIAGNOSIS — N1832 Chronic kidney disease, stage 3b: Secondary | ICD-10-CM | POA: Insufficient documentation

## 2020-10-02 DIAGNOSIS — R0602 Shortness of breath: Secondary | ICD-10-CM | POA: Diagnosis not present

## 2020-10-02 DIAGNOSIS — Z794 Long term (current) use of insulin: Secondary | ICD-10-CM | POA: Insufficient documentation

## 2020-10-02 DIAGNOSIS — Z7984 Long term (current) use of oral hypoglycemic drugs: Secondary | ICD-10-CM | POA: Insufficient documentation

## 2020-10-02 LAB — BASIC METABOLIC PANEL
Anion gap: 13 (ref 5–15)
BUN: 68 mg/dL — ABNORMAL HIGH (ref 8–23)
CO2: 27 mmol/L (ref 22–32)
Calcium: 7.2 mg/dL — ABNORMAL LOW (ref 8.9–10.3)
Chloride: 100 mmol/L (ref 98–111)
Creatinine, Ser: 3.21 mg/dL — ABNORMAL HIGH (ref 0.44–1.00)
GFR, Estimated: 15 mL/min — ABNORMAL LOW (ref >60)
Glucose, Bld: 119 mg/dL — ABNORMAL HIGH (ref 70–99)
Potassium: 3.5 mmol/L (ref 3.5–5.1)
Sodium: 140 mmol/L (ref 135–145)

## 2020-10-02 NOTE — Patient Instructions (Signed)
Labs done today. We will contact you only if your labs are abnormal.  [facility] will be out to give you iv lasix for 2 days, when you receive the IV lasix DO NOT TAKE YOUR TORSEMIDE ON THOSE DAYS.  No other medication changes were made. Please continue all current medications as prescribed.  Your physician recommends that you schedule a follow-up appointment in: 7-10 days  If you have any questions or concerns before your next appointment please send [place] a message through [place] or call our office at [phone].    TO LEAVE A MESSAGE FOR THE NURSE SELECT OPTION 2, PLEASE LEAVE A MESSAGE INCLUDING: YOUR NAME DATE OF BIRTH CALL BACK NUMBER REASON FOR CALL**this is important as we prioritize the call backs  YOU WILL RECEIVE A CALL BACK THE SAME DAY AS LONG AS YOU CALL BEFORE 4:00 PM   Do the following things EVERYDAY: Weigh yourself in the morning before breakfast. Write it down and keep it in a log. Take your medicines as prescribed Eat low salt foods--Limit salt (sodium) to 2000 mg per day.  Stay as active as you can everyday Limit all fluids for the day to less than 2 liters   At the [facility], you and your health needs are our priority. As part of our continuing mission to provide you with exceptional heart care, we have created designated Provider Care Teams. These Care Teams include your primary Cardiologist (physician) and Advanced Practice Providers (APPs- Physician Assistants and Nurse Practitioners) who all work together to provide you with the care you need, when you need it.   You may see any of the following providers on your designated Care Team at your next follow up: Dr Glori Bickers Dr Haynes Kerns, NP Lyda Jester, [place] Audry Riles, PharmD   Please be sure to bring in all your medications bottles to every appointment.
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
## 2020-10-03 DIAGNOSIS — I5032 Chronic diastolic (congestive) heart failure: Secondary | ICD-10-CM | POA: Diagnosis not present

## 2020-10-04 DIAGNOSIS — E876 Hypokalemia: Secondary | ICD-10-CM | POA: Diagnosis not present

## 2020-10-04 DIAGNOSIS — N183 Chronic kidney disease, stage 3 unspecified: Secondary | ICD-10-CM | POA: Diagnosis not present

## 2020-10-04 DIAGNOSIS — I5032 Chronic diastolic (congestive) heart failure: Secondary | ICD-10-CM | POA: Diagnosis not present

## 2020-10-04 DIAGNOSIS — I272 Pulmonary hypertension, unspecified: Secondary | ICD-10-CM | POA: Diagnosis not present

## 2020-10-04 DIAGNOSIS — H05223 Edema of bilateral orbit: Secondary | ICD-10-CM | POA: Diagnosis not present

## 2020-10-05 DIAGNOSIS — I517 Cardiomegaly: Secondary | ICD-10-CM | POA: Diagnosis not present

## 2020-10-05 DIAGNOSIS — I5032 Chronic diastolic (congestive) heart failure: Secondary | ICD-10-CM | POA: Diagnosis not present

## 2020-10-05 DIAGNOSIS — J9811 Atelectasis: Secondary | ICD-10-CM | POA: Diagnosis not present

## 2020-10-05 DIAGNOSIS — I5022 Chronic systolic (congestive) heart failure: Secondary | ICD-10-CM | POA: Diagnosis not present

## 2020-10-05 DIAGNOSIS — I272 Pulmonary hypertension, unspecified: Secondary | ICD-10-CM | POA: Diagnosis not present

## 2020-10-05 DIAGNOSIS — R0600 Dyspnea, unspecified: Secondary | ICD-10-CM | POA: Diagnosis not present

## 2020-10-05 DIAGNOSIS — N183 Chronic kidney disease, stage 3 unspecified: Secondary | ICD-10-CM | POA: Diagnosis not present

## 2020-10-06 DIAGNOSIS — I5032 Chronic diastolic (congestive) heart failure: Secondary | ICD-10-CM | POA: Diagnosis not present

## 2020-10-07 DIAGNOSIS — H05223 Edema of bilateral orbit: Secondary | ICD-10-CM | POA: Diagnosis not present

## 2020-10-07 DIAGNOSIS — I5032 Chronic diastolic (congestive) heart failure: Secondary | ICD-10-CM | POA: Diagnosis not present

## 2020-10-09 ENCOUNTER — Telehealth (HOSPITAL_COMMUNITY): Payer: Self-pay | Admitting: *Deleted

## 2020-10-09 NOTE — Telephone Encounter (Signed)
Pt left vm stating her weight is 213lbs and she needs to know a game plan. I called pt back to get more information no answer/left vm requesting return call.

## 2020-10-12 DIAGNOSIS — E119 Type 2 diabetes mellitus without complications: Secondary | ICD-10-CM | POA: Diagnosis not present

## 2020-10-12 DIAGNOSIS — I5031 Acute diastolic (congestive) heart failure: Secondary | ICD-10-CM | POA: Diagnosis not present

## 2020-10-12 DIAGNOSIS — I5032 Chronic diastolic (congestive) heart failure: Secondary | ICD-10-CM | POA: Diagnosis not present

## 2020-10-12 DIAGNOSIS — N179 Acute kidney failure, unspecified: Secondary | ICD-10-CM | POA: Diagnosis not present

## 2020-10-12 DIAGNOSIS — N183 Chronic kidney disease, stage 3 unspecified: Secondary | ICD-10-CM | POA: Diagnosis not present

## 2020-10-13 DIAGNOSIS — M5416 Radiculopathy, lumbar region: Secondary | ICD-10-CM | POA: Diagnosis not present

## 2020-10-13 DIAGNOSIS — M5136 Other intervertebral disc degeneration, lumbar region: Secondary | ICD-10-CM | POA: Diagnosis not present

## 2020-10-13 DIAGNOSIS — M5459 Other low back pain: Secondary | ICD-10-CM | POA: Diagnosis not present

## 2020-10-13 DIAGNOSIS — G894 Chronic pain syndrome: Secondary | ICD-10-CM | POA: Diagnosis not present

## 2020-10-13 DIAGNOSIS — M47816 Spondylosis without myelopathy or radiculopathy, lumbar region: Secondary | ICD-10-CM | POA: Diagnosis not present

## 2020-10-13 DIAGNOSIS — M79604 Pain in right leg: Secondary | ICD-10-CM | POA: Diagnosis not present

## 2020-10-13 DIAGNOSIS — I5022 Chronic systolic (congestive) heart failure: Secondary | ICD-10-CM | POA: Diagnosis not present

## 2020-10-13 DIAGNOSIS — M5126 Other intervertebral disc displacement, lumbar region: Secondary | ICD-10-CM | POA: Diagnosis not present
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
## 2020-10-14 ENCOUNTER — Ambulatory Visit (HOSPITAL_COMMUNITY)
Admission: RE | Admit: 2020-10-14 | Discharge: 2020-10-14 | Disposition: A | Discharge status: Home / Self Care | Payer: Medicare Other | Source: Ambulatory Visit | Attending: Family Medicine | Admitting: Family Medicine

## 2020-10-14 ENCOUNTER — Encounter (HOSPITAL_COMMUNITY): Payer: Self-pay

## 2020-10-14 ENCOUNTER — Other Ambulatory Visit: Payer: Self-pay

## 2020-10-14 ENCOUNTER — Inpatient Hospital Stay (HOSPITAL_COMMUNITY)
Admission: AD | Admit: 2020-10-14 | Discharge: 2020-10-25 | DRG: 286 | Disposition: A | Discharge status: Hospice | Payer: Medicare Other | Source: Ambulatory Visit | Attending: Cardiology | Admitting: Cardiology

## 2020-10-14 ENCOUNTER — Other Ambulatory Visit (HOSPITAL_COMMUNITY): Payer: Self-pay | Admitting: Adult Health

## 2020-10-14 VITALS — BP 120/78 | HR 60 | Wt 211.8 lb

## 2020-10-14 DIAGNOSIS — Z6841 Body Mass Index (BMI) 40.0 and over, adult: Secondary | ICD-10-CM | POA: Diagnosis not present

## 2020-10-14 DIAGNOSIS — N184 Chronic kidney disease, stage 4 (severe): Secondary | ICD-10-CM | POA: Diagnosis not present

## 2020-10-14 DIAGNOSIS — I255 Ischemic cardiomyopathy: Secondary | ICD-10-CM | POA: Diagnosis present

## 2020-10-14 DIAGNOSIS — M898X9 Other specified disorders of bone, unspecified site: Secondary | ICD-10-CM | POA: Diagnosis present

## 2020-10-14 DIAGNOSIS — I5021 Acute systolic (congestive) heart failure: Secondary | ICD-10-CM

## 2020-10-14 DIAGNOSIS — Z7902 Long term (current) use of antithrombotics/antiplatelets: Secondary | ICD-10-CM | POA: Insufficient documentation

## 2020-10-14 DIAGNOSIS — Z7982 Long term (current) use of aspirin: Secondary | ICD-10-CM | POA: Insufficient documentation

## 2020-10-14 DIAGNOSIS — I472 Ventricular tachycardia: Secondary | ICD-10-CM | POA: Diagnosis not present

## 2020-10-14 DIAGNOSIS — T508X5A Adverse effect of diagnostic agents, initial encounter: Secondary | ICD-10-CM | POA: Diagnosis present

## 2020-10-14 DIAGNOSIS — I272 Pulmonary hypertension, unspecified: Secondary | ICD-10-CM | POA: Diagnosis not present

## 2020-10-14 DIAGNOSIS — I252 Old myocardial infarction: Secondary | ICD-10-CM | POA: Insufficient documentation

## 2020-10-14 DIAGNOSIS — I517 Cardiomegaly: Secondary | ICD-10-CM | POA: Diagnosis not present

## 2020-10-14 DIAGNOSIS — I130 Hypertensive heart and chronic kidney disease with heart failure and stage 1 through stage 4 chronic kidney disease, or unspecified chronic kidney disease: Principal | ICD-10-CM | POA: Diagnosis present

## 2020-10-14 DIAGNOSIS — N179 Acute kidney failure, unspecified: Secondary | ICD-10-CM | POA: Diagnosis present

## 2020-10-14 DIAGNOSIS — E119 Type 2 diabetes mellitus without complications: Secondary | ICD-10-CM | POA: Diagnosis not present

## 2020-10-14 DIAGNOSIS — Z515 Encounter for palliative care: Secondary | ICD-10-CM | POA: Diagnosis not present

## 2020-10-14 DIAGNOSIS — E871 Hypo-osmolality and hyponatremia: Secondary | ICD-10-CM | POA: Diagnosis not present

## 2020-10-14 DIAGNOSIS — I428 Other cardiomyopathies: Secondary | ICD-10-CM | POA: Diagnosis present

## 2020-10-14 DIAGNOSIS — J811 Chronic pulmonary edema: Secondary | ICD-10-CM | POA: Diagnosis not present

## 2020-10-14 DIAGNOSIS — N1832 Chronic kidney disease, stage 3b: Secondary | ICD-10-CM

## 2020-10-14 DIAGNOSIS — I5022 Chronic systolic (congestive) heart failure: Secondary | ICD-10-CM

## 2020-10-14 DIAGNOSIS — Z79890 Hormone replacement therapy (postmenopausal): Secondary | ICD-10-CM

## 2020-10-14 DIAGNOSIS — I509 Heart failure, unspecified: Secondary | ICD-10-CM | POA: Diagnosis not present

## 2020-10-14 DIAGNOSIS — I2582 Chronic total occlusion of coronary artery: Secondary | ICD-10-CM | POA: Diagnosis present

## 2020-10-14 DIAGNOSIS — D631 Anemia in chronic kidney disease: Secondary | ICD-10-CM | POA: Diagnosis not present

## 2020-10-14 DIAGNOSIS — E785 Hyperlipidemia, unspecified: Secondary | ICD-10-CM | POA: Diagnosis present

## 2020-10-14 DIAGNOSIS — I2510 Atherosclerotic heart disease of native coronary artery without angina pectoris: Secondary | ICD-10-CM | POA: Diagnosis not present

## 2020-10-14 DIAGNOSIS — D509 Iron deficiency anemia, unspecified: Secondary | ICD-10-CM | POA: Diagnosis present

## 2020-10-14 DIAGNOSIS — Z8673 Personal history of transient ischemic attack (TIA), and cerebral infarction without residual deficits: Secondary | ICD-10-CM

## 2020-10-14 DIAGNOSIS — I5084 End stage heart failure: Secondary | ICD-10-CM | POA: Diagnosis present

## 2020-10-14 DIAGNOSIS — D649 Anemia, unspecified: Secondary | ICD-10-CM | POA: Diagnosis not present

## 2020-10-14 DIAGNOSIS — R32 Unspecified urinary incontinence: Secondary | ICD-10-CM | POA: Diagnosis present

## 2020-10-14 DIAGNOSIS — I5032 Chronic diastolic (congestive) heart failure: Secondary | ICD-10-CM

## 2020-10-14 DIAGNOSIS — G4733 Obstructive sleep apnea (adult) (pediatric): Secondary | ICD-10-CM | POA: Diagnosis present

## 2020-10-14 DIAGNOSIS — Z833 Family history of diabetes mellitus: Secondary | ICD-10-CM

## 2020-10-14 DIAGNOSIS — Z888 Allergy status to other drugs, medicaments and biological substances status: Secondary | ICD-10-CM

## 2020-10-14 DIAGNOSIS — I5023 Acute on chronic systolic (congestive) heart failure: Secondary | ICD-10-CM | POA: Diagnosis present

## 2020-10-14 DIAGNOSIS — Z8249 Family history of ischemic heart disease and other diseases of the circulatory system: Secondary | ICD-10-CM | POA: Insufficient documentation

## 2020-10-14 DIAGNOSIS — E876 Hypokalemia: Secondary | ICD-10-CM | POA: Diagnosis not present

## 2020-10-14 DIAGNOSIS — Z79899 Other long term (current) drug therapy: Secondary | ICD-10-CM

## 2020-10-14 DIAGNOSIS — R0602 Shortness of breath: Secondary | ICD-10-CM | POA: Insufficient documentation

## 2020-10-14 DIAGNOSIS — Z8639 Personal history of other endocrine, nutritional and metabolic disease: Secondary | ICD-10-CM

## 2020-10-14 DIAGNOSIS — I071 Rheumatic tricuspid insufficiency: Secondary | ICD-10-CM | POA: Diagnosis present

## 2020-10-14 DIAGNOSIS — E669 Obesity, unspecified: Secondary | ICD-10-CM | POA: Insufficient documentation

## 2020-10-14 DIAGNOSIS — E860 Dehydration: Secondary | ICD-10-CM | POA: Diagnosis present

## 2020-10-14 DIAGNOSIS — I493 Ventricular premature depolarization: Secondary | ICD-10-CM | POA: Diagnosis not present

## 2020-10-14 DIAGNOSIS — Z66 Do not resuscitate: Secondary | ICD-10-CM | POA: Diagnosis not present

## 2020-10-14 DIAGNOSIS — N141 Nephropathy induced by other drugs, medicaments and biological substances: Secondary | ICD-10-CM | POA: Diagnosis present

## 2020-10-14 DIAGNOSIS — Z90710 Acquired absence of both cervix and uterus: Secondary | ICD-10-CM

## 2020-10-14 DIAGNOSIS — I5042 Chronic combined systolic (congestive) and diastolic (congestive) heart failure: Secondary | ICD-10-CM | POA: Insufficient documentation

## 2020-10-14 DIAGNOSIS — E1122 Type 2 diabetes mellitus with diabetic chronic kidney disease: Secondary | ICD-10-CM

## 2020-10-14 DIAGNOSIS — Z794 Long term (current) use of insulin: Secondary | ICD-10-CM | POA: Insufficient documentation

## 2020-10-14 DIAGNOSIS — Z20822 Contact with and (suspected) exposure to covid-19: Secondary | ICD-10-CM | POA: Diagnosis present

## 2020-10-14 DIAGNOSIS — I5082 Biventricular heart failure: Secondary | ICD-10-CM | POA: Diagnosis present

## 2020-10-14 DIAGNOSIS — K219 Gastro-esophageal reflux disease without esophagitis: Secondary | ICD-10-CM | POA: Diagnosis present

## 2020-10-14 DIAGNOSIS — I5043 Acute on chronic combined systolic (congestive) and diastolic (congestive) heart failure: Secondary | ICD-10-CM | POA: Diagnosis present

## 2020-10-14 DIAGNOSIS — Z7189 Other specified counseling: Secondary | ICD-10-CM | POA: Diagnosis not present

## 2020-10-14 LAB — BASIC METABOLIC PANEL
Anion gap: 13 (ref 5–15)
Anion gap: 15 (ref 5–15)
BUN: 90 mg/dL — ABNORMAL HIGH (ref 8–23)
BUN: 87 mg/dL — ABNORMAL HIGH (ref 8–23)
CO2: 23 mmol/L (ref 22–32)
CO2: 22 mmol/L (ref 22–32)
Calcium: 7.3 mg/dL — ABNORMAL LOW (ref 8.9–10.3)
Calcium: 7.5 mg/dL — ABNORMAL LOW (ref 8.9–10.3)
Chloride: 104 mmol/L (ref 98–111)
Chloride: 104 mmol/L (ref 98–111)
Creatinine, Ser: 3.87 mg/dL — ABNORMAL HIGH (ref 0.44–1.00)
Creatinine, Ser: 3.52 mg/dL — ABNORMAL HIGH (ref 0.44–1.00)
GFR, Estimated: 12 mL/min — ABNORMAL LOW (ref >60)
GFR, Estimated: 13 mL/min — ABNORMAL LOW (ref >60)
Glucose, Bld: 155 mg/dL — ABNORMAL HIGH (ref 70–99)
Glucose, Bld: 159 mg/dL — ABNORMAL HIGH (ref 70–99)
Potassium: 3.1 mmol/L — ABNORMAL LOW (ref 3.5–5.1)
Potassium: 3.3 mmol/L — ABNORMAL LOW (ref 3.5–5.1)
Sodium: 140 mmol/L (ref 135–145)
Sodium: 141 mmol/L (ref 135–145)

## 2020-10-14 LAB — GLUCOSE, CAPILLARY
Glucose-Capillary: 128 mg/dL — ABNORMAL HIGH (ref 70–99)
Glucose-Capillary: 157 mg/dL — ABNORMAL HIGH (ref 70–99)

## 2020-10-14 LAB — CBC
HCT: 29.5 % — ABNORMAL LOW (ref 36.0–46.0)
Hemoglobin: 8.9 g/dL — ABNORMAL LOW (ref 12.0–15.0)
MCH: 20.9 pg — ABNORMAL LOW (ref 26.0–34.0)
MCHC: 30.2 g/dL (ref 30.0–36.0)
MCV: 69.4 fL — ABNORMAL LOW (ref 80.0–100.0)
Platelets: DECREASED 10*3/uL (ref 150–400)
RBC: 4.25 MIL/uL (ref 3.87–5.11)
RDW: 18.8 % — ABNORMAL HIGH (ref 11.5–15.5)
WBC: 7.1 10*3/uL (ref 4.0–10.5)
nRBC: 0.7 % — ABNORMAL HIGH (ref 0.0–0.2)

## 2020-10-14 LAB — SARS CORONAVIRUS 2 (TAT 6-24 HRS): SARS Coronavirus 2: NEGATIVE

## 2020-10-14 LAB — BRAIN NATRIURETIC PEPTIDE: B Natriuretic Peptide: 1972 pg/mL — ABNORMAL HIGH (ref 0.0–100.0)

## 2020-10-14 MED ORDER — POTASSIUM CHLORIDE CRYS ER 20 MEQ PO TBCR
40.0000 meq | EXTENDED_RELEASE_TABLET | Freq: Once | ORAL | Status: AC
  Administered 2020-10-14: 40 meq via ORAL
  Filled 2020-10-14: qty 2

## 2020-10-14 MED ORDER — PREGABALIN 75 MG PO CAPS
75.0000 mg | ORAL_CAPSULE | Freq: Every day | ORAL | Status: DC
  Administered 2020-10-14: 75 mg via ORAL

## 2020-10-14 MED ORDER — BUSPIRONE HCL 5 MG PO TABS
7.5000 mg | ORAL_TABLET | Freq: Two times a day (BID) | ORAL | Status: DC
  Administered 2020-10-14 – 2020-10-25 (×22): 7.5 mg via ORAL
  Filled 2020-10-14 (×22): qty 2

## 2020-10-14 MED ORDER — ACETAMINOPHEN 500 MG PO TABS
1000.0000 mg | ORAL_TABLET | Freq: Four times a day (QID) | ORAL | Status: DC | PRN
  Administered 2020-10-17 – 2020-10-24 (×6): 1000 mg via ORAL
  Filled 2020-10-14 (×8): qty 2

## 2020-10-14 MED ORDER — HYDROXYZINE HCL 25 MG PO TABS
25.0000 mg | ORAL_TABLET | Freq: Four times a day (QID) | ORAL | Status: DC
  Administered 2020-10-14 – 2020-10-25 (×43): 25 mg via ORAL
  Filled 2020-10-14 (×43): qty 1

## 2020-10-14 MED ORDER — CARVEDILOL 3.125 MG PO TABS
3.1250 mg | ORAL_TABLET | Freq: Two times a day (BID) | ORAL | Status: DC
  Administered 2020-10-14 – 2020-10-25 (×22): 3.125 mg via ORAL
  Filled 2020-10-14 (×23): qty 1

## 2020-10-14 MED ORDER — ALBUTEROL SULFATE HFA 108 (90 BASE) MCG/ACT IN AERS
2.0000 | INHALATION_SPRAY | Freq: Four times a day (QID) | RESPIRATORY_TRACT | Status: DC | PRN
  Filled 2020-10-14: qty 6.7

## 2020-10-14 MED ORDER — SODIUM CHLORIDE 0.9% FLUSH: 3.0000 mL | Freq: Two times a day (BID) | INTRAVENOUS | Status: DC

## 2020-10-14 MED ORDER — INSULIN ASPART 100 UNIT/ML IJ SOLN
0.0000 [IU] | Freq: Every day | INTRAMUSCULAR | Status: DC
  Administered 2020-10-17 – 2020-10-18 (×2): 2 [IU] via SUBCUTANEOUS

## 2020-10-14 MED ORDER — SODIUM CHLORIDE 0.9 % IV SOLN: INTRAVENOUS | Status: DC

## 2020-10-14 MED ORDER — HEPARIN SODIUM (PORCINE) 5000 UNIT/ML IJ SOLN
5000.0000 [IU] | Freq: Three times a day (TID) | INTRAMUSCULAR | Status: DC
  Administered 2020-10-14 – 2020-10-25 (×31): 5000 [IU] via SUBCUTANEOUS
  Filled 2020-10-14 (×33): qty 1

## 2020-10-14 MED ORDER — PRAVASTATIN SODIUM 40 MG PO TABS
80.0000 mg | ORAL_TABLET | Freq: Every day | ORAL | Status: DC
  Administered 2020-10-15 – 2020-10-24 (×10): 80 mg via ORAL
  Filled 2020-10-14 (×10): qty 2

## 2020-10-14 MED ORDER — SODIUM CHLORIDE 0.9 % IV SOLN
250.0000 mL | INTRAVENOUS | Status: DC | PRN
  Administered 2020-10-18: 250 mL via INTRAVENOUS

## 2020-10-14 MED ORDER — SODIUM CHLORIDE 0.9% FLUSH: 3.0000 mL | INTRAVENOUS | Status: DC | PRN

## 2020-10-14 MED ORDER — PREGABALIN 75 MG PO CAPS
150.0000 mg | ORAL_CAPSULE | Freq: Every day | ORAL | Status: DC
  Administered 2020-10-14 – 2020-10-24 (×11): 150 mg via ORAL
  Filled 2020-10-14 (×11): qty 2

## 2020-10-14 MED ORDER — INSULIN ASPART 100 UNIT/ML IJ SOLN
0.0000 [IU] | Freq: Three times a day (TID) | INTRAMUSCULAR | Status: DC
  Administered 2020-10-15: 1 [IU] via SUBCUTANEOUS
  Administered 2020-10-16: 3 [IU] via SUBCUTANEOUS
  Administered 2020-10-16: 1 [IU] via SUBCUTANEOUS
  Administered 2020-10-16 – 2020-10-17 (×2): 3 [IU] via SUBCUTANEOUS
  Administered 2020-10-17: 2 [IU] via SUBCUTANEOUS
  Administered 2020-10-17: 3 [IU] via SUBCUTANEOUS
  Administered 2020-10-18 – 2020-10-19 (×5): 2 [IU] via SUBCUTANEOUS
  Administered 2020-10-19: 1 [IU] via SUBCUTANEOUS
  Administered 2020-10-20: 2 [IU] via SUBCUTANEOUS
  Administered 2020-10-20 (×2): 3 [IU] via SUBCUTANEOUS
  Administered 2020-10-21: 2 [IU] via SUBCUTANEOUS
  Administered 2020-10-21: 1 [IU] via SUBCUTANEOUS
  Administered 2020-10-22: 2 [IU] via SUBCUTANEOUS
  Administered 2020-10-22 (×2): 3 [IU] via SUBCUTANEOUS
  Administered 2020-10-23 (×3): 2 [IU] via SUBCUTANEOUS
  Administered 2020-10-24: 5 [IU] via SUBCUTANEOUS
  Administered 2020-10-24: 3 [IU] via SUBCUTANEOUS
  Administered 2020-10-24 – 2020-10-25 (×3): 2 [IU] via SUBCUTANEOUS

## 2020-10-14 MED ORDER — LEVOTHYROXINE SODIUM 100 MCG PO TABS
100.0000 ug | ORAL_TABLET | ORAL | Status: DC
  Administered 2020-10-15 – 2020-10-24 (×8): 100 ug via ORAL
  Filled 2020-10-14 (×8): qty 1

## 2020-10-14 MED ORDER — FLUTICASONE PROPIONATE 50 MCG/ACT NA SUSP
2.0000 | Freq: Two times a day (BID) | NASAL | Status: DC | PRN
  Administered 2020-10-15 – 2020-10-17 (×4): 2 via NASAL
  Filled 2020-10-14: qty 16

## 2020-10-14 MED ORDER — ASPIRIN EC 81 MG PO TBEC
81.0000 mg | DELAYED_RELEASE_TABLET | Freq: Every day | ORAL | Status: DC
  Administered 2020-10-15 – 2020-10-25 (×11): 81 mg via ORAL
  Filled 2020-10-14 (×11): qty 1

## 2020-10-14 MED ORDER — SODIUM CHLORIDE 0.9% FLUSH
3.0000 mL | Freq: Two times a day (BID) | INTRAVENOUS | Status: DC
  Administered 2020-10-14 – 2020-10-25 (×18): 3 mL via INTRAVENOUS

## 2020-10-14 MED ORDER — ALBUTEROL SULFATE (2.5 MG/3ML) 0.083% IN NEBU
3.0000 mL | INHALATION_SOLUTION | RESPIRATORY_TRACT | Status: DC | PRN
  Administered 2020-10-17 – 2020-10-18 (×3): 3 mL via RESPIRATORY_TRACT
  Filled 2020-10-14 (×3): qty 3

## 2020-10-14 MED ORDER — PANTOPRAZOLE SODIUM 40 MG PO TBEC
40.0000 mg | DELAYED_RELEASE_TABLET | Freq: Every day | ORAL | Status: DC
  Administered 2020-10-14 – 2020-10-25 (×12): 40 mg via ORAL
  Filled 2020-10-14 (×12): qty 1

## 2020-10-14 MED ORDER — CLOPIDOGREL BISULFATE 75 MG PO TABS
75.0000 mg | ORAL_TABLET | Freq: Every day | ORAL | Status: DC
  Administered 2020-10-15 – 2020-10-25 (×11): 75 mg via ORAL
  Filled 2020-10-14 (×11): qty 1

## 2020-10-14 MED ORDER — SILDENAFIL CITRATE 20 MG PO TABS
60.0000 mg | ORAL_TABLET | Freq: Three times a day (TID) | ORAL | Status: DC
  Administered 2020-10-14 – 2020-10-25 (×33): 60 mg via ORAL
  Filled 2020-10-14 (×35): qty 3

## 2020-10-14 MED ORDER — IVABRADINE HCL 7.5 MG PO TABS
7.5000 mg | ORAL_TABLET | Freq: Two times a day (BID) | ORAL | Status: DC
  Administered 2020-10-15 – 2020-10-25 (×21): 7.5 mg via ORAL
  Filled 2020-10-14 (×24): qty 1

## 2020-10-14 MED ORDER — SODIUM CHLORIDE 0.9 % IV SOLN: 250.0000 mL | INTRAVENOUS | Status: DC | PRN

## 2020-10-14 MED ORDER — PREGABALIN 75 MG PO CAPS
75.0000 mg | ORAL_CAPSULE | Freq: Every day | ORAL | Status: DC
  Administered 2020-10-15 – 2020-10-25 (×11): 75 mg via ORAL
  Filled 2020-10-14 (×12): qty 1

## 2020-10-14 MED ORDER — ADULT MULTIVITAMIN W/MINERALS CH
1.0000 | ORAL_TABLET | Freq: Every day | ORAL | Status: DC
  Administered 2020-10-15 – 2020-10-25 (×11): 1 via ORAL
  Filled 2020-10-14 (×11): qty 1

## 2020-10-14 MED ORDER — FUROSEMIDE 10 MG/ML IJ SOLN
80.0000 mg | Freq: Two times a day (BID) | INTRAMUSCULAR | Status: DC
  Administered 2020-10-14 – 2020-10-18 (×7): 80 mg via INTRAVENOUS
  Filled 2020-10-14 (×7): qty 8

## 2020-10-14 MED ORDER — ALLOPURINOL 100 MG PO TABS
50.0000 mg | ORAL_TABLET | Freq: Every day | ORAL | Status: DC
  Administered 2020-10-15 – 2020-10-25 (×11): 50 mg via ORAL
  Filled 2020-10-14 (×11): qty 1

## 2020-10-14 NOTE — Progress Notes (Signed)
ReDS Vest / Clip - 10/14/20 1200       ReDS Vest / Clip   Station Marker A    Ruler Value 31    ReDS Value Range High volume overload    ReDS Actual Value 45

## 2020-10-14 NOTE — Progress Notes (Signed)
Orthopedic Tech Progress Note Patient Details:  Grace Bradford 10/11/1945 [id number]  Ortho Devices Type of Ortho Device: Haematologist Ortho Device/Splint Location: bil legs Ortho Device/Splint Interventions: Ordered, Application   Post Interventions Patient Tolerated: Well  Thanks,  Verdene Lennert, PT, DPT  Acute Rehabilitation Ortho Tech Supervisor [phone] pager #[phone] office     [name] 10/14/2020, 5:16 PM
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
## 2020-10-15 ENCOUNTER — Inpatient Hospital Stay (HOSPITAL_COMMUNITY)
Admission: AD | Disposition: A | Discharge status: Hospice | Payer: Self-pay | Source: Ambulatory Visit | Attending: Internal Medicine

## 2020-10-15 ENCOUNTER — Inpatient Hospital Stay (HOSPITAL_COMMUNITY): Payer: Medicare Other

## 2020-10-15 DIAGNOSIS — E119 Type 2 diabetes mellitus without complications: Secondary | ICD-10-CM

## 2020-10-15 DIAGNOSIS — N1832 Chronic kidney disease, stage 3b: Secondary | ICD-10-CM

## 2020-10-15 DIAGNOSIS — I493 Ventricular premature depolarization: Secondary | ICD-10-CM

## 2020-10-15 HISTORY — PX: RIGHT HEART CATH: CATH118263

## 2020-10-15 LAB — BASIC METABOLIC PANEL
Anion gap: 14 (ref 5–15)
BUN: 85 mg/dL — ABNORMAL HIGH (ref 8–23)
CO2: 25 mmol/L (ref 22–32)
Calcium: 7.7 mg/dL — ABNORMAL LOW (ref 8.9–10.3)
Chloride: 101 mmol/L (ref 98–111)
Creatinine, Ser: 3.35 mg/dL — ABNORMAL HIGH (ref 0.44–1.00)
GFR, Estimated: 14 mL/min — ABNORMAL LOW (ref >60)
Glucose, Bld: 100 mg/dL — ABNORMAL HIGH (ref 70–99)
Potassium: 3 mmol/L — ABNORMAL LOW (ref 3.5–5.1)
Sodium: 140 mmol/L (ref 135–145)

## 2020-10-15 LAB — POCT I-STAT EG7
Acid-Base Excess: 2 mmol/L (ref 0.0–2.0)
Acid-Base Excess: 3 mmol/L — ABNORMAL HIGH (ref 0.0–2.0)
Bicarbonate: 27 mmol/L (ref 20.0–28.0)
Bicarbonate: 28.1 mmol/L — ABNORMAL HIGH (ref 20.0–28.0)
Calcium, Ion: 0.92 mmol/L — ABNORMAL LOW (ref 1.15–1.40)
Calcium, Ion: 1.02 mmol/L — ABNORMAL LOW (ref 1.15–1.40)
HCT: 30 % — ABNORMAL LOW (ref 36.0–46.0)
HCT: 31 % — ABNORMAL LOW (ref 36.0–46.0)
Hemoglobin: 10.2 g/dL — ABNORMAL LOW (ref 12.0–15.0)
Hemoglobin: 10.5 g/dL — ABNORMAL LOW (ref 12.0–15.0)
O2 Saturation: 62 %
O2 Saturation: 63 %
Potassium: 2.6 mmol/L — CL (ref 3.5–5.1)
Potassium: 2.7 mmol/L — CL (ref 3.5–5.1)
Sodium: 145 mmol/L (ref 135–145)
Sodium: 147 mmol/L — ABNORMAL HIGH (ref 135–145)
TCO2: 28 mmol/L (ref 22–32)
TCO2: 29 mmol/L (ref 22–32)
pCO2, Ven: 42 mmHg — ABNORMAL LOW (ref 44.0–60.0)
pCO2, Ven: 44.3 mmHg (ref 44.0–60.0)
pH, Ven: 7.41 (ref 7.250–7.430)
pH, Ven: 7.416 (ref 7.250–7.430)
pO2, Ven: 32 mmHg (ref 32.0–45.0)
pO2, Ven: 32 mmHg (ref 32.0–45.0)

## 2020-10-15 LAB — CBC
HCT: 30.2 % — ABNORMAL LOW (ref 36.0–46.0)
Hemoglobin: 9.1 g/dL — ABNORMAL LOW (ref 12.0–15.0)
MCH: 20.9 pg — ABNORMAL LOW (ref 26.0–34.0)
MCHC: 30.1 g/dL (ref 30.0–36.0)
MCV: 69.4 fL — ABNORMAL LOW (ref 80.0–100.0)
Platelets: 122 10*3/uL — ABNORMAL LOW (ref 150–400)
RBC: 4.35 MIL/uL (ref 3.87–5.11)
RDW: 19.1 % — ABNORMAL HIGH (ref 11.5–15.5)
WBC: 6.2 10*3/uL (ref 4.0–10.5)
nRBC: 0.6 % — ABNORMAL HIGH (ref 0.0–0.2)

## 2020-10-15 LAB — TSH: TSH: 1.269 u[IU]/mL (ref 0.350–4.500)

## 2020-10-15 LAB — GLUCOSE, CAPILLARY
Glucose-Capillary: 135 mg/dL — ABNORMAL HIGH (ref 70–99)
Glucose-Capillary: 115 mg/dL — ABNORMAL HIGH (ref 70–99)
Glucose-Capillary: 104 mg/dL — ABNORMAL HIGH (ref 70–99)
Glucose-Capillary: 200 mg/dL — ABNORMAL HIGH (ref 70–99)

## 2020-10-15 SURGERY — RIGHT HEART CATH
Anesthesia: LOCAL

## 2020-10-15 MED ORDER — HEPARIN (PORCINE) IN NACL 1000-0.9 UT/500ML-% IV SOLN
INTRAVENOUS | Status: AC
  Filled 2020-10-15: qty 500

## 2020-10-15 MED ORDER — POTASSIUM CHLORIDE CRYS ER 20 MEQ PO TBCR
40.0000 meq | EXTENDED_RELEASE_TABLET | Freq: Once | ORAL | Status: AC
  Administered 2020-10-15: 40 meq via ORAL
  Filled 2020-10-15: qty 2

## 2020-10-15 MED ORDER — HEPARIN (PORCINE) IN NACL 1000-0.9 UT/500ML-% IV SOLN
INTRAVENOUS | Status: DC | PRN
  Administered 2020-10-15: 500 mL

## 2020-10-15 MED ORDER — DIAZEPAM 5 MG PO TABS
2.5000 mg | ORAL_TABLET | Freq: Once | ORAL | Status: AC
  Administered 2020-10-15: 2.5 mg via ORAL
  Filled 2020-10-15: qty 1

## 2020-10-15 MED ORDER — LIDOCAINE HCL (PF) 1 % IJ SOLN
INTRAMUSCULAR | Status: AC
  Filled 2020-10-15: qty 30

## 2020-10-15 MED ORDER — LIDOCAINE HCL (PF) 1 % IJ SOLN
INTRAMUSCULAR | Status: DC | PRN
  Administered 2020-10-15: 2 mL

## 2020-10-15 SURGICAL SUPPLY — 5 items
CATH BALLN WEDGE 5F 110CM (CATHETERS) ×1 IMPLANT
KIT MICROPUNCTURE NIT STIFF (SHEATH) ×2 IMPLANT
PACK CARDIAC CATHETERIZATION (CUSTOM PROCEDURE TRAY) ×2 IMPLANT
SHEATH GLIDE SLENDER 4/5FR (SHEATH) ×2 IMPLANT
TRANSDUCER W/STOPCOCK (MISCELLANEOUS) ×2 IMPLANT

## 2020-10-15 NOTE — Progress Notes (Addendum)
Advanced Heart Failure Rounding Note  PCP-Cardiologist: None   Subjective:   Admitted with AKI and volume overload. Started on IV lasix. I/O not accurate. Weight 1 pound.   Creatinine 3.8-->3.35  Denies SOB. Hungry wants to eat.    Objective:   Weight Range: 94.7 kg Body mass index is 39.43 kg/m.   Vital Signs:   Temp:  [97.4 F (36.3 C)-98.2 F (36.8 C)] 98.2 F (36.8 C) (06/22 0411) Pulse Rate:  [65-72] 72 (06/22 0411) Resp:  [16-18] 16 (06/22 0411) BP: (125-146)/(49-66) 146/66 (06/22 0411) SpO2:  [96 %-98 %] 98 % (06/22 0411) Weight:  [94.7 kg-95.1 kg] 94.7 kg (06/22 0206) Last BM Date: 10/14/20  Weight change: Filed Weights   10/14/20 1625 10/15/20 0206  Weight: 95.1 kg 94.7 kg    Intake/Output:   Intake/Output Summary (Last 24 hours) at 10/15/2020 0759 Last data filed at 10/15/2020 0537 Gross per 24 hour  Intake 243 ml  Output 1400 ml  Net -1157 ml      Physical Exam    General:   No resp difficulty HEENT: Normal Neck: Supple. JVP 10-11. Carotids 2+ bilat; no bruits. No lymphadenopathy or thyromegaly appreciated. Cor: PMI nondisplaced. Regular rate & rhythm. No rubs, gallops or murmurs. Lungs: Clear Abdomen: Soft, nontender, nondistended. No hepatosplenomegaly. No bruits or masses. Good bowel sounds. Extremities: No cyanosis, clubbing, rash, R and LLE unna boots. R and L 1+ edema.  Neuro: Alert & orientedx3, cranial nerves grossly intact. moves all 4 extremities w/o difficulty. Affect pleasant   Telemetry  SR with PVCs 60-70s   EKG   N/A  Labs    CBC Recent Labs    10/14/20 1810 10/15/20 0214  WBC 7.1 6.2  HGB 8.9* 9.1*  HCT 29.5* 30.2*  MCV 69.4* 69.4*  PLT PLATELET CLUMPS NOTED ON SMEAR, COUNT APPEARS DECREASED 914*   Basic Metabolic Panel Recent Labs    10/14/20 1810 10/15/20 0214  NA 141 140  K 3.3* 3.0*  CL 104 101  CO2 22 25  GLUCOSE 159* 100*  BUN 87* 85*  CREATININE 3.52* 3.35*  CALCIUM 7.5* 7.7*   Liver  Function Tests No results for input(s): AST, ALT, ALKPHOS, BILITOT, PROT, ALBUMIN in the last 72 hours. No results for input(s): LIPASE, AMYLASE in the last 72 hours. Cardiac Enzymes No results for input(s): CKTOTAL, CKMB, CKMBINDEX, TROPONINI in the last 72 hours.  BNP: BNP (last 3 results) Recent Labs    09/03/20 1511 09/25/20 0842 10/14/20 1252  BNP 1,286.7* 1,752.8* 1,972.0*    ProBNP (last 3 results) No results for input(s): PROBNP in the last 8760 hours.   D-Dimer No results for input(s): DDIMER in the last 72 hours. Hemoglobin A1C No results for input(s): HGBA1C in the last 72 hours. Fasting Lipid Panel No results for input(s): CHOL, HDL, LDLCALC, TRIG, CHOLHDL, LDLDIRECT in the last 72 hours. Thyroid Function Tests Recent Labs    10/15/20 0214  TSH 1.269    Other results:   Imaging    No results found.   Medications:     Scheduled Medications:  allopurinol  50 mg Oral Daily   aspirin EC  81 mg Oral Daily   busPIRone  7.5 mg Oral BID   carvedilol  3.125 mg Oral BID WC   clopidogrel  75 mg Oral Daily   furosemide  80 mg Intravenous BID   heparin  5,000 Units Subcutaneous Q8H   hydrOXYzine  25 mg Oral QID   insulin aspart  0-5 Units Subcutaneous QHS   insulin aspart  0-9 Units Subcutaneous TID WC   ivabradine  7.5 mg Oral BID WC   levothyroxine  100 mcg Oral Once per day on Mon Tue Wed Thu Fri   multivitamin with minerals  1 tablet Oral Daily   pantoprazole  40 mg Oral Daily   pravastatin  80 mg Oral q1800   pregabalin  150 mg Oral QHS   pregabalin  75 mg Oral Daily   sildenafil  60 mg Oral TID   sodium chloride flush  3 mL Intravenous Q12H    Infusions:  sodium chloride     sodium chloride     sodium chloride 10 mL/hr at 10/15/20 0657    PRN Medications: sodium chloride, sodium chloride, acetaminophen, albuterol, albuterol, fluticasone, sodium chloride flush, sodium chloride flush    Patient Profile   Itzelle is a 75 y/o woman with  obesity, DM2, HTN, HL, CRI and HF with 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 Echo 3/18 EF 45-50%.  Admitted with AKI and volume overload.   Assessment/Plan  1. Acute on chronic Systolic Heart Failure - NICM, EF 30% (10/2014).  --> Echo 06/2016, EF 45-50% - Echo 4/19 EF 40-45% - Echo 3/21 EF 35-40% moderate RV dysfunction - Diuretics had been cut back since 2/22 after admit for dehydration and AKI. SCr peaked to 4.5 - Admitted with volume overload. Needs additional diuresis.  - Continue IV diuresis and adjust post cath. -  - - Hold Farxiga w/ worsening SCr. - Continue  Coreg to 3.125 mg bid to allow BP room to push diuresis. - Continue Sildenafil (for RV dysjunction). - Off Spiro & dig w/ renal dysfunction. - Renal function better today.  -  2. 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 Hollie Salk - SCr baseline ~2.6 - Renal US showed mildly increased renal cortical cortical echogenicity but otherwise unremarkable. - Myeloma w/u negative    - Admit creatinine 3.9--> today 3.35 - Follow BMET daily .   3. H/o Hypercalcemia  - Calcitrol and calcium replacement has been discontinued. - Bone survey w/ no evidence of lytic or sclerotic lesions. Myeloma w/u negative - Ca 7.7 today     4. Type 2 DM  - Controlled. Hgb A1c 6.1.  - Stop Farxiga. - SSI  5. PVC Will watch and may need add amio.   Consult PT. RHC later today and adjust diuretics as needed.     Length of Stay: 1  Amy Clegg, NP  10/15/2020, 7:59 AM  Advanced Heart Failure Team Pager [phone] (M-F; [place])  Please contact [facility] for night-coverage after hours (5p -7a ) and weekends on [email or web address]  Patient seen and examined with the above-signed Advanced Practice Provider and/or Housestaff. I personally reviewed laboratory data, imaging studies and relevant notes. I independently examined the patient and formulated the important aspects of the plan. I have edited the note to reflect any of my changes or salient  points. I have personally discussed the plan with the patient and/or family.  Diuresed well but weight only down one pound. Breathing better but still SOB with mild exertion. Creatinine mildly improved.   General:  Sitting in bed  No resp difficulty HEENT: normal Neck: supple.JVP hard to see  looks elevated . Carotids 2+ bilat; no bruits. No lymphadenopathy or thryomegaly appreciated. Cor: PMI nondisplaced. Regular rate & rhythm. No rubs, gallops or murmurs. Lungs: clear Abdomen: obese soft, nontender, nondistended. No hepatosplenomegaly. No  bruits or masses. Good bowel sounds. Extremities: no cyanosis, clubbing, rash, 1+ edema Neuro: alert & orientedx3, cranial nerves grossly intact. moves all 4 extremities w/o difficulty. Affect pleasant  Mildly improved but hard to assess volume status. Seems to have very narrow euvolemic window. May be nearing time for HD. Will plan RHC today. Will also need repeat echo.   Glori Bickers, MD  2:55 PM

## 2020-10-15 NOTE — Evaluation (Signed)
Occupational Therapy Evaluation Patient Details Name: Grace Bradford MRN: [id number] DOB: 01/22/1946 Today's Date: 10/15/2020    History of Present Illness Pt is a 75 y.o. female admitted 10/14/20 with SOB. Workup for acute on chronic HF exacerbation. PMH includes CHF, CVA, HTN, gout, MI, neuropathy, OSA, DM2, back pain.   Clinical Impression   PTA patient independent using rollator for mobility and ADLs, limited IADLs.  Admitted for above and presenting with problem list below, including generalized weakness, impaired balance, pain and decreased activity tolerance. Patient currently requires setup for UB ADLs and up to min assist for LB ADLs, min assist to min guard for transfers and in room mobility using RW.  Based on performance today, believe she will benefit from continued OT services while admitted to optimize independence and return to [facility] but anticipate no further needs after dc home.     Follow Up Recommendations  No OT follow up;Supervision - Intermittent    Equipment Recommendations  None recommended by OT    Recommendations for Other Services PT consult     Precautions / Restrictions Precautions Precautions: Fall Restrictions Weight Bearing Restrictions: No      Mobility Bed Mobility Overal bed mobility: Needs Assistance Bed Mobility: Supine to Sit     Supine to sit: Min assist     General bed mobility comments: pt reaching for UB support to fully ascend trunk, increased time required    Transfers Overall transfer level: Needs assistance Equipment used: Rolling walker (2 wheeled) Transfers: Sit to/from Stand Sit to Stand: Min assist         General transfer comment: min assist to power up and steady from EOB    Balance Overall balance assessment: Needs assistance Sitting-balance support: No upper extremity supported;Feet supported Sitting balance-Leahy Scale: Fair     Standing balance support: Bilateral upper extremity supported;During functional  activity Standing balance-Leahy Scale: Poor Standing balance comment: relies on UE support                           ADL either performed or assessed with clinical judgement   ADL Overall ADL's : Needs assistance/impaired     Grooming: Set up;Sitting           Upper Body Dressing : Set up;Sitting   Lower Body Dressing: Minimal assistance;Sit to/from stand   Toilet Transfer: Minimal assistance;Ambulation;RW Toilet Transfer Details (indicate cue type and reason): simulated to recliner         Functional mobility during ADLs: Min guard;Rolling walker General ADL Comments: pt limited by weakness, pain and decreased activity tolerance     Vision         Perception     Praxis      Pertinent Vitals/Pain Pain Assessment: 0-10 Pain Score: 6  Pain Location: R thigh (sciatica) chronic Pain Descriptors / Indicators: Throbbing Pain Intervention(s): Limited activity within patient's tolerance;Repositioned;Monitored during session;Patient requesting pain meds-RN notified     Hand Dominance Right   Extremity/Trunk Assessment Upper Extremity Assessment Upper Extremity Assessment: Generalized weakness   Lower Extremity Assessment Lower Extremity Assessment: Defer to PT evaluation       Communication Communication Communication: No difficulties   Cognition Arousal/Alertness: Awake/alert Behavior During Therapy: WFL for tasks assessed/performed Overall Cognitive Status: Within Functional Limits for tasks assessed  General Comments  VSS on RA    Exercises     Shoulder Instructions      Home Living Family/patient expects to be discharged to:: Private residence Living Arrangements: Spouse/significant other Available Help at Discharge: Family;Available 24 hours/day Type of Home: House Home Access: Stairs to enter [employer] of Steps: 4 Entrance Stairs-Rails: Left;Right Home Layout: Two  level Alternate Level Stairs-Number of Steps: chair lift   Bathroom Shower/Tub: Occupational psychologist: Standard     Home Equipment: Environmental consultant - 2 wheels;Walker - 4 wheels;Bedside commode;Shower seat - built in;Grab bars - tub/shower          Prior Functioning/Environment Level of Independence: Independent with assistive device(s)        Comments: uses rollator for mobility, independent ADLs, limited IADLs (spouse or oldest daughter assists)(not driving)        OT Problem List: Decreased strength;Impaired balance (sitting and/or standing);Decreased activity tolerance;Decreased knowledge of precautions;Pain;Obesity      OT Treatment/Interventions: Self-care/ADL training;Therapeutic exercise;DME and/or AE instruction;Therapeutic activities;Patient/family education;Balance training;Energy conservation    OT Goals(Current goals can be found in the care plan section) Acute Rehab OT Goals Patient Stated Goal: to get back home OT Goal Formulation: With patient Time For Goal Achievement: 10/29/20 Potential to Achieve Goals: Good  OT Frequency: Min 2X/week   Barriers to D/C:            Co-evaluation              AM-PAC OT "6 Clicks" Daily Activity     Outcome Measure Help from another person eating meals?: Total (NPO today) Help from another person taking care of personal grooming?: A Little Help from another person toileting, which includes using toliet, bedpan, or urinal?: A Little Help from another person bathing (including washing, rinsing, drying)?: A Little Help from another person to put on and taking off regular upper body clothing?: A Little Help from another person to put on and taking off regular lower body clothing?: A Little 6 Click Score: 16   End of Session Equipment Utilized During Treatment: Rolling walker Nurse Communication: Mobility status;Patient requests pain meds  Activity Tolerance: Patient tolerated treatment well Patient left: in  chair;with call bell/phone within reach;with chair alarm set  OT Visit Diagnosis: Other abnormalities of gait and mobility (R26.89);Muscle weakness (generalized) (M62.81);Pain Pain - Right/Left: Right Pain - part of body: Leg                Time: 6226-3335 OT Time Calculation (min): 19 min Charges:  OT General Charges $OT Visit: 1 Visit OT Evaluation $OT Eval Moderate Complexity: 1 Mod  Jolaine Artist, OT Acute Rehabilitation Services Pager [phone] Office [phone]   Delight Stare 10/15/2020, 10:38 AM

## 2020-10-15 NOTE — Evaluation (Signed)
Physical Therapy Evaluation Patient Details Name: Grace Bradford MRN: [id number] DOB: 07/14/1945 Today's Date: 10/15/2020   History of Present Illness  Pt is a 75 y.o. female admitted 10/14/20 with SOB. Workup for acute on chronic HF exacerbation, AKI. Plan for heart cath 6/22. PMH includes CHF, CVA, HTN, gout, MI, neuropathy, OSA, DM2, back pain.   Clinical Impression  Pt presents with an overall decrease in functional mobility secondary to above. PTA, pt mod indep with rollator for limited distances, lives with spouse, family assists with ADLs/iADLs as needed. Today, pt tolerated short ambulation distance with rollator and min guard for balance. Pt reports limited by fatigue and R knee "sciatica" pain. Pt would benefit from continued acute PT services to maximize functional mobility and independence prior to d/c with outpatient PT to further address lower back and RLE impairments.  SpO2 97-100% on RA HR 84 Post-mobility BP 153/70    Follow Up Recommendations Outpatient PT;Supervision - Intermittent    Equipment Recommendations  None recommended by PT    Recommendations for Other Services       Precautions / Restrictions Precautions Precautions: Fall Restrictions Weight Bearing Restrictions: No      Mobility  Bed Mobility Overal bed mobility: Needs Assistance Bed Mobility: Supine to Sit     Supine to sit: Min assist     General bed mobility comments: Received sitting in recliner    Transfers Overall transfer level: Needs assistance Equipment used: 4-wheeled walker Transfers: Sit to/from Stand Sit to Stand: Min guard         General transfer comment: Verbal cues to lock rollator brakes, able to stand with min guard for balance, heavy reliance on UE support to push into standing  Ambulation/Gait Ambulation/Gait assistance: Min guard Gait Distance (Feet): 40 Feet Assistive device: 4-wheeled walker Gait Pattern/deviations: Step-through pattern;Decreased stride  length;Antalgic;Trunk flexed Gait velocity: Decreased   General Gait Details: Slow, angaltic gait with rollator and intermittent min guard for balance; pt with trunk flexed against rollator, attributes this to lower back and R knee pain; declines further distance secondary to pain and fatigue  Stairs            Wheelchair Mobility    Modified Rankin (Stroke Patients Only)       Balance Overall balance assessment: Needs assistance Sitting-balance support: No upper extremity supported;Feet supported Sitting balance-Leahy Scale: Fair     Standing balance support: Bilateral upper extremity supported;During functional activity;Single extremity supported Standing balance-Leahy Scale: Poor Standing balance comment: relies on UE support                             Pertinent Vitals/Pain Pain Assessment: Faces Pain Score: 6  Faces Pain Scale: Hurts even more Pain Location: R knee (attributes to sciatica) Pain Descriptors / Indicators: Throbbing Pain Intervention(s): Limited activity within patient's tolerance;Monitored during session    [place] expects to be discharged to:: Private residence Living Arrangements: Spouse/significant other Available Help at Discharge: Family;Available 24 hours/day Type of Home: House Home Access: Stairs to enter Entrance Stairs-Rails: Chemical engineer of Steps: 4 Home Layout: Two level Home Equipment: Environmental consultant - 2 wheels;Walker - 4 wheels;Bedside commode;Shower seat - built in;Grab bars - tub/shower      Prior Function Level of Independence: Independent with assistive device(s)         Comments: Mod indep limited ambulator with rollator; independent with ADLs; spouse or daughter assists with iADLs. Does not drive  Hand Dominance   Dominant Hand: Right    Extremity/Trunk Assessment   Upper Extremity Assessment Upper Extremity Assessment: Generalized weakness    Lower Extremity  Assessment Lower Extremity Assessment: Generalized weakness       Communication   Communication: No difficulties  Cognition Arousal/Alertness: Lethargic;Awake/alert Behavior During Therapy: WFL for tasks assessed/performed Overall Cognitive Status: Within Functional Limits for tasks assessed                                 General Comments: Initially lethargic falling asleep during conversation, more alert with mobility      General Comments General comments (skin integrity, edema, etc.): SpO2 97-100% on RA, HR 84, post-mobility BP 153/70    Exercises     Assessment/Plan    PT Assessment Patient needs continued PT services  PT Problem List Decreased strength;Decreased activity tolerance;Decreased balance;Decreased mobility;Cardiopulmonary status limiting activity;Pain       PT Treatment Interventions DME instruction;Gait training;Stair training;Functional mobility training;Balance training;Therapeutic exercise;Therapeutic activities;Patient/family education    PT Goals (Current goals can be found in the Care Plan section)  Acute Rehab PT Goals Patient Stated Goal: Return home, decreased pain PT Goal Formulation: With patient Time For Goal Achievement: 10/29/20 Potential to Achieve Goals: Good    Frequency Min 3X/week   Barriers to discharge        Co-evaluation               AM-PAC PT "6 Clicks" Mobility  Outcome Measure Help needed turning from your back to your side while in a flat bed without using bedrails?: A Little Help needed moving from lying on your back to sitting on the side of a flat bed without using bedrails?: A Little Help needed moving to and from a bed to a chair (including a wheelchair)?: A Little Help needed standing up from a chair using your arms (e.g., wheelchair or bedside chair)?: A Little Help needed to walk in hospital room?: A Little Help needed climbing 3-5 steps with a railing? : A Lot 6 Click Score: 17    End of  Session   Activity Tolerance: Patient limited by pain;Patient limited by fatigue Patient left: in chair;with call bell/phone within reach;with chair alarm set Nurse Communication: Mobility status PT Visit Diagnosis: Other abnormalities of gait and mobility (R26.89);Pain Pain - Right/Left: Right Pain - part of body: Leg    Time: 1046-1100 PT Time Calculation (min) (ACUTE ONLY): 14 min   Charges:   PT Evaluation $PT Eval Moderate Complexity: 1 Mod        Mabeline Caras, PT, DPT Acute Rehabilitation Services  Pager [phone] Office [phone]  Derry Lory 10/15/2020, 12:44 PM

## 2020-10-15 NOTE — Interval H&P Note (Signed)
History and Physical Interval Note:  10/15/2020 2:33 PM  Grace Bradford  has presented today for surgery, with the diagnosis of heart failure.  The various methods of treatment have been discussed with the patient and family. After consideration of risks, benefits and other options for treatment, the patient has consented to  Procedure(s): RIGHT HEART CATH (N/A) as a surgical intervention.  The patient's history has been reviewed, patient examined, no change in status, stable for surgery.  I have reviewed the patient's chart and labs.  Questions were answered to the patient's satisfaction.     Grace Bradford

## 2020-10-16 ENCOUNTER — Inpatient Hospital Stay (HOSPITAL_COMMUNITY): Payer: Medicare Other

## 2020-10-16 ENCOUNTER — Encounter (HOSPITAL_COMMUNITY): Payer: Self-pay | Admitting: Internal Medicine

## 2020-10-16 DIAGNOSIS — I5023 Acute on chronic systolic (congestive) heart failure: Secondary | ICD-10-CM

## 2020-10-16 LAB — BASIC METABOLIC PANEL
Anion gap: 12 (ref 5–15)
Anion gap: 5 (ref 5–15)
BUN: 67 mg/dL — ABNORMAL HIGH (ref 8–23)
BUN: 64 mg/dL — ABNORMAL HIGH (ref 8–23)
CO2: 27 mmol/L (ref 22–32)
CO2: 30 mmol/L (ref 22–32)
Calcium: 7.7 mg/dL — ABNORMAL LOW (ref 8.9–10.3)
Calcium: 7.3 mg/dL — ABNORMAL LOW (ref 8.9–10.3)
Chloride: 102 mmol/L (ref 98–111)
Chloride: 105 mmol/L (ref 98–111)
Creatinine, Ser: 2.8 mg/dL — ABNORMAL HIGH (ref 0.44–1.00)
Creatinine, Ser: 2.66 mg/dL — ABNORMAL HIGH (ref 0.44–1.00)
GFR, Estimated: 17 mL/min — ABNORMAL LOW (ref >60)
GFR, Estimated: 18 mL/min — ABNORMAL LOW (ref >60)
Glucose, Bld: 146 mg/dL — ABNORMAL HIGH (ref 70–99)
Glucose, Bld: 203 mg/dL — ABNORMAL HIGH (ref 70–99)
Potassium: 2.9 mmol/L — ABNORMAL LOW (ref 3.5–5.1)
Potassium: 3.7 mmol/L (ref 3.5–5.1)
Sodium: 141 mmol/L (ref 135–145)
Sodium: 140 mmol/L (ref 135–145)

## 2020-10-16 LAB — ECHOCARDIOGRAM COMPLETE
AR max vel: 2.34 cm2
AV Area VTI: 2.28 cm2
AV Area mean vel: 2.27 cm2
AV Mean grad: 4 mmHg
AV Peak grad: 8 mmHg
Ao pk vel: 1.41 m/s
Area-P 1/2: 5.84 cm2
Height: 61 in
S' Lateral: 5.1 cm
Weight: 3268.8 oz

## 2020-10-16 LAB — GLUCOSE, CAPILLARY
Glucose-Capillary: 144 mg/dL — ABNORMAL HIGH (ref 70–99)
Glucose-Capillary: 206 mg/dL — ABNORMAL HIGH (ref 70–99)
Glucose-Capillary: 192 mg/dL — ABNORMAL HIGH (ref 70–99)
Glucose-Capillary: 198 mg/dL — ABNORMAL HIGH (ref 70–99)

## 2020-10-16 LAB — MAGNESIUM: Magnesium: 1.4 mg/dL — ABNORMAL LOW (ref 1.7–2.4)

## 2020-10-16 MED ORDER — POTASSIUM CHLORIDE CRYS ER 20 MEQ PO TBCR
60.0000 meq | EXTENDED_RELEASE_TABLET | Freq: Once | ORAL | Status: AC
  Filled 2020-10-16: qty 3

## 2020-10-16 MED ORDER — POTASSIUM CHLORIDE CRYS ER 20 MEQ PO TBCR
40.0000 meq | EXTENDED_RELEASE_TABLET | ORAL | Status: DC
  Administered 2020-10-16: 40 meq via ORAL
  Filled 2020-10-16 (×4): qty 2

## 2020-10-16 MED ORDER — POTASSIUM CHLORIDE CRYS ER 20 MEQ PO TBCR
20.0000 meq | EXTENDED_RELEASE_TABLET | Freq: Once | ORAL | Status: AC
  Administered 2020-10-16: 20 meq via ORAL

## 2020-10-16 MED ORDER — POTASSIUM CHLORIDE CRYS ER 20 MEQ PO TBCR
60.0000 meq | EXTENDED_RELEASE_TABLET | Freq: Once | ORAL | Status: AC
  Administered 2020-10-16: 60 meq via ORAL

## 2020-10-16 MED ORDER — PERFLUTREN LIPID MICROSPHERE
1.0000 mL | INTRAVENOUS | Status: AC | PRN
  Administered 2020-10-16: 6 mL via INTRAVENOUS
  Filled 2020-10-16: qty 10

## 2020-10-16 MED ORDER — POTASSIUM CHLORIDE ER 10 MEQ PO TBCR
20.0000 meq | EXTENDED_RELEASE_TABLET | Freq: Once | ORAL | Status: AC
  Administered 2020-10-16: 20 meq via ORAL
  Filled 2020-10-16: qty 2

## 2020-10-16 MED FILL — Lidocaine HCl Local Preservative Free (PF) Inj 1%: INTRAMUSCULAR | Qty: 30 | Status: AC

## 2020-10-16 NOTE — Progress Notes (Addendum)
Advanced Heart Failure Rounding Note  PCP-Cardiologist: None   Subjective:   Admitted with AKI and volume overload. Started on IV lasix. RHC yesterday demonstrated moderately to severely elevated biventricular filling pressures (R>L) and severe mixed pulmonary HTN.  Brisk diuresis yesterday, -4L in UOP. Wt down 4 lb. Had orthopnea/PND last night.   SCr 3.35>>2.80  K 2.9   Ventricular bigeminy on tele   RHC  Findings:   RA = 15 RV = 78/24 PA = 86/33 (52) PCW = 22 Fick cardiac output/index = 5.8/3.00 PVR = 5.0 Ao sat = 99% PA sat = 62%, 63%   Assessment: 1. Moderately to severely elevated biventricular filling pressures (R>L) 2. Severe mixed pulmonary HTN      Objective:   Weight Range: 92.7 kg Body mass index is 38.6 kg/m.   Vital Signs:   Temp:  [97.6 F (36.4 C)-99.1 F (37.3 C)] 98.9 F (37.2 C) (06/23 0426) Pulse Rate:  [19-80] 79 (06/23 0426) Resp:  [10-32] 18 (06/23 0426) BP: (136-157)/(58-118) 136/74 (06/23 0426) SpO2:  [96 %-100 %] 98 % (06/23 0426) Weight:  [92.7 kg] 92.7 kg (06/23 0058) Last BM Date: 10/13/20  Weight change: Filed Weights   10/14/20 1625 10/15/20 0206 10/16/20 0058  Weight: 95.1 kg 94.7 kg 92.7 kg    Intake/Output:   Intake/Output Summary (Last 24 hours) at 10/16/2020 0858 Last data filed at 10/16/2020 0200 Gross per 24 hour  Intake 438.24 ml  Output 4050 ml  Net -3611.76 ml      Physical Exam     General:  fatigued appearing, elderly, sitting up on side of bed No respiratory difficulty HEENT: normal Neck: supple. JVD elevated to jaw  Carotids 2+ bilat; no bruits. No lymphadenopathy or thyromegaly appreciated. Cor: PMI nondisplaced. Irregular, PVCs . No rubs, gallops or murmurs. Lungs: decreased BS at the bases bilaterally, RLL crackles  Abdomen: soft, nontender, nondistended. No hepatosplenomegaly. No bruits or masses. Good bowel sounds. Extremities: no cyanosis, clubbing, rash, 1+ bilateral LE edema + unna  boots  Neuro: alert & oriented x 3, cranial nerves grossly intact. moves all 4 extremities w/o difficulty. Affect pleasant.     Telemetry   NSR w/ PVCs, ventricular bigeminy   EKG    No new EKG to review   Labs    CBC Recent Labs    10/14/20 1810 10/15/20 0214 10/15/20 1509  WBC 7.1 6.2  --   HGB 8.9* 9.1* 10.2*  10.5*  HCT 29.5* 30.2* 30.0*  31.0*  MCV 69.4* 69.4*  --   PLT PLATELET CLUMPS NOTED ON SMEAR, COUNT APPEARS DECREASED 122*  --    Basic Metabolic Panel Recent Labs    10/15/20 0214 10/15/20 1509 10/16/20 0257  NA 140 147*  145 141  K 3.0* 2.6*  2.7* 2.9*  CL 101  --  102  CO2 25  --  27  GLUCOSE 100*  --  146*  BUN 85*  --  67*  CREATININE 3.35*  --  2.80*  CALCIUM 7.7*  --  7.7*   Liver Function Tests No results for input(s): AST, ALT, ALKPHOS, BILITOT, PROT, ALBUMIN in the last 72 hours. No results for input(s): LIPASE, AMYLASE in the last 72 hours. Cardiac Enzymes No results for input(s): CKTOTAL, CKMB, CKMBINDEX, TROPONINI in the last 72 hours.  BNP: BNP (last 3 results) Recent Labs    09/03/20 1511 09/25/20 0842 10/14/20 1252  BNP 1,286.7* 1,752.8* 1,972.0*    ProBNP (last 3 results) No results  for input(s): PROBNP in the last 8760 hours.   D-Dimer No results for input(s): DDIMER in the last 72 hours. Hemoglobin A1C No results for input(s): HGBA1C in the last 72 hours. Fasting Lipid Panel No results for input(s): CHOL, HDL, LDLCALC, TRIG, CHOLHDL, LDLDIRECT in the last 72 hours. Thyroid Function Tests Recent Labs    10/15/20 0214  TSH 1.269    Other results:   Imaging    CARDIAC CATHETERIZATION  Result Date: 10/15/2020 Findings: RA = 15 RV = 78/24 PA = 86/33 (52) PCW = 22 Fick cardiac output/index = 5.8/3.00 PVR = 5.0 Ao sat = 99% PA sat = 62%, 63% Assessment: 1. Moderately to severely elevated biventricular filling pressures (R>L) 2. Severe mixed pulmonary HTN Plan/Discussion: Will continue IV diuresis. Given  narrow euvolemic window may be approaching time to consider HD. Will d/w Dr. Hollie Salk. Glori Bickers, MD 3:24 PM     Medications:     Scheduled Medications:  allopurinol  50 mg Oral Daily   aspirin EC  81 mg Oral Daily   busPIRone  7.5 mg Oral BID   carvedilol  3.125 mg Oral BID WC   clopidogrel  75 mg Oral Daily   furosemide  80 mg Intravenous BID   heparin  5,000 Units Subcutaneous Q8H   hydrOXYzine  25 mg Oral QID   insulin aspart  0-5 Units Subcutaneous QHS   insulin aspart  0-9 Units Subcutaneous TID WC   ivabradine  7.5 mg Oral BID WC   levothyroxine  100 mcg Oral Once per day on Mon Tue Wed Thu Fri   multivitamin with minerals  1 tablet Oral Daily   pantoprazole  40 mg Oral Daily   pravastatin  80 mg Oral q1800   pregabalin  150 mg Oral QHS   pregabalin  75 mg Oral Daily   sildenafil  60 mg Oral TID   sodium chloride flush  3 mL Intravenous Q12H    Infusions:  sodium chloride      PRN Medications: sodium chloride, acetaminophen, albuterol, albuterol, fluticasone, sodium chloride flush    Patient Profile   Grace Bradford is a 75 y/o woman with obesity, DM2, HTN, HL, CRI and HF with 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 Echo 3/18 EF 45-50%.  Admitted with AKI and volume overload.   Assessment/Plan  1. Acute on chronic Systolic Heart Failure - NICM, EF 30% (10/2014).  --> Echo 06/2016, EF 45-50% - Echo 4/19 EF 40-45% - Echo 3/21 EF 35-40% moderate RV dysfunction - Diuretics had been cut back since 2/22 after admit for dehydration and AKI. SCr peaked to 4.5 - Admitted with volume overload. Responding well w/ IV Lasix but still fluid overloaded  - RHC w/ moderately to severely elevated biventricular filling pressures (R>L). RA 15. PCW 22. CO/CI normal - Continue IV Lasix 80 mg bid and supp K  - Given narrow euvolemic window may be approaching time to consider HD - Hold Farxiga w/ worsening SCr. - Continue Coreg to 3.125 mg bid  - Continue Sildenafil (for RV  dysjunction). - Off Spiro & dig w/ renal dysfunction. - Renal function better today. Follow w/ diuresis  - will update echo   2. 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 Hollie Salk - SCr baseline ~2.6 - Renal US showed mildly increased renal cortical cortical echogenicity but otherwise unremarkable. - Myeloma w/u negative    - Admit creatinine 3.9--> 3.35->2.8  - Follow BMET daily .   3. H/o  Hypercalcemia  - Calcitrol and calcium replacement has been discontinued. - Bone survey w/ no evidence of lytic or sclerotic lesions. Myeloma w/u negative - Ca 7.7 today     4. Type 2 DM  - Controlled. Hgb A1c 6.1.  - Stop Farxiga. - SSI  5. PVCs - Supp K (2.9) and Check Mg level d/w PharmD   Mobilize w/ PT    Length of Stay: 2  Lyda Jester, PA-C  10/16/2020, 8:58 AM  Advanced Heart Failure Team Pager [phone] (M-F; 7a - 5p)  Please contact [facility] for night-coverage after hours (5p -7a ) and weekends on [email or web address]  Patient seen and examined with the above-signed Advanced Practice Provider and/or Housestaff. I personally reviewed laboratory data, imaging studies and relevant notes. I independently examined the patient and formulated the important aspects of the plan. I have edited the note to reflect any of my changes or salient points. I have personally discussed the plan with the patient and/or family.  RHC results from yesterday reviewed with her. She remains volume overloaded. Weigh down 5 pounds but still SOB and edematous. SCR improving. K 2.9  General:  Sitting up on side of bed No resp difficulty HEENT: normal Neck: supple. JVP to jaw  Carotids 2+ bilat; no bruits. No lymphadenopathy or thryomegaly appreciated. Cor: PMI nondisplaced. Regular rate & rhythm. No rubs, gallops or murmurs. Lungs: clear Abdomen: obese soft, nontender, nondistended. No hepatosplenomegaly. No bruits or masses. Good bowel sounds. Extremities: no cyanosis, clubbing, rash, 2+  edema Neuro: alert & orientedx3, cranial nerves grossly intact. moves all 4 extremities w/o difficulty. Affect pleasant  Will continue diuresis with IV lasix. Given narrow euvolemic window, I am concerned that she may be getting close to time to prepare for HD with vein mapping. I suspect non-compliance with fluid restriction also playing a role. Will order fluid restriction. Supp K aggressively.   Glori Bickers, MD  7:01 PM
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
## 2020-10-17 LAB — BASIC METABOLIC PANEL
Anion gap: 11 (ref 5–15)
BUN: 61 mg/dL — ABNORMAL HIGH (ref 8–23)
CO2: 27 mmol/L (ref 22–32)
Calcium: 7.8 mg/dL — ABNORMAL LOW (ref 8.9–10.3)
Chloride: 102 mmol/L (ref 98–111)
Creatinine, Ser: 2.35 mg/dL — ABNORMAL HIGH (ref 0.44–1.00)
GFR, Estimated: 21 mL/min — ABNORMAL LOW (ref >60)
Glucose, Bld: 161 mg/dL — ABNORMAL HIGH (ref 70–99)
Potassium: 3.9 mmol/L (ref 3.5–5.1)
Sodium: 140 mmol/L (ref 135–145)

## 2020-10-17 LAB — GLUCOSE, CAPILLARY
Glucose-Capillary: 190 mg/dL — ABNORMAL HIGH (ref 70–99)
Glucose-Capillary: 174 mg/dL — ABNORMAL HIGH (ref 70–99)
Glucose-Capillary: 201 mg/dL — ABNORMAL HIGH (ref 70–99)
Glucose-Capillary: 201 mg/dL — ABNORMAL HIGH (ref 70–99)

## 2020-10-17 MED ORDER — POTASSIUM CHLORIDE CRYS ER 20 MEQ PO TBCR
40.0000 meq | EXTENDED_RELEASE_TABLET | Freq: Every day | ORAL | Status: DC
  Administered 2020-10-17 – 2020-10-25 (×9): 40 meq via ORAL
  Filled 2020-10-17 (×9): qty 2

## 2020-10-17 MED ORDER — METOLAZONE 2.5 MG PO TABS
2.5000 mg | ORAL_TABLET | Freq: Once | ORAL | Status: AC
  Administered 2020-10-17: 2.5 mg via ORAL
  Filled 2020-10-17: qty 1

## 2020-10-17 MED ORDER — DICLOFENAC SODIUM 1 % EX GEL
2.0000 g | Freq: Four times a day (QID) | CUTANEOUS | Status: DC
  Administered 2020-10-17 – 2020-10-25 (×31): 2 g via TOPICAL
  Filled 2020-10-17: qty 100

## 2020-10-17 MED ORDER — MAGNESIUM SULFATE 4 GM/100ML IV SOLN
4.0000 g | Freq: Once | INTRAVENOUS | Status: AC
  Administered 2020-10-17: 4 g via INTRAVENOUS
  Filled 2020-10-17: qty 100

## 2020-10-17 NOTE — Progress Notes (Addendum)
CSW spoke with [facility] to clarify about what services they provide. [facility] reported that they are like a doctor's office without walls and they bill the patients insurance just like a doctor's office but do not provide ongoing weekly care like home health as their agreement with [facility] ended on May 1st. Ms. Baldi will need home health services set up as [facility] does not provide that type of care but their physicians can provide [facility] orders if needed. [facility] will send a nurse to the patients home and help with medications if needed but not on a regular weekly basis and therefore Ms. Steinmiller may need paramedicine services for help with medication assistance if needed.  Liviah Cake, MSW, [place] Heart Failure Social Worker

## 2020-10-17 NOTE — Progress Notes (Signed)
Occupational Therapy Treatment Patient Details Name: Grace Bradford MRN: [id number] DOB: 08/16/1945 Today's Date: 10/17/2020    History of present illness Pt is a 75 y.o. female admitted 10/14/20 with SOB. Workup for acute on chronic HF exacerbation, AKI. S/p RHC 6/22. PMH includes CHF, CVA, HTN, gout, MI, neuropathy, OSA, DM2, back pain.   OT comments  Patient seated on commode upon entry.  Completed hygiene after +BM with min assist given max encouragement to complete task, functional mobility in room using rollator with min guard and grooming at sink with min guard.  Noted HR up to 130 briefly with grooming at sink, ranging from 80s-120 during activity in room.  Continue per POC, DC plan remains appropriate.    Follow Up Recommendations  No OT follow up;Supervision - Intermittent    Equipment Recommendations  None recommended by OT    Recommendations for Other Services      Precautions / Restrictions Precautions Precautions: Fall Restrictions Weight Bearing Restrictions: No       Mobility Bed Mobility Overal bed mobility: Needs Assistance Bed Mobility: Sit to Supine       Sit to supine: Min assist   General bed mobility comments: to bring LB back to supine    Transfers Overall transfer level: Needs assistance Equipment used: 4-wheeled walker Transfers: Sit to/from Stand Sit to Stand: Min guard         General transfer comment: min guard for safety/balance    Balance Overall balance assessment: Needs assistance Sitting-balance support: No upper extremity supported;Feet supported Sitting balance-Leahy Scale: Good     Standing balance support: Bilateral upper extremity supported;During functional activity;Single extremity supported Standing balance-Leahy Scale: Poor Standing balance comment: requires at least 1 UE support during ADLs, leaning on sink                           ADL either performed or assessed with clinical judgement   ADL  Overall ADL's : Needs assistance/impaired     Grooming: Supervision/safety;Standing;Wash/dry hands               Lower Body Dressing: Minimal assistance;Sit to/from stand   Toilet Transfer: Min guard;Ambulation (rollator)   Toileting- Clothing Manipulation and Hygiene: Minimal assistance;Sit to/from stand Toileting - Clothing Manipulation Details (indicate cue type and reason): for hygiene, to ensure cleaniness     Functional mobility during ADLs: Min guard (rollator) General ADL Comments: pt limited by weakness, pain and decreased activity tolerance     Vision       Perception     Praxis      Cognition Arousal/Alertness: Awake/alert Behavior During Therapy: WFL for tasks assessed/performed Overall Cognitive Status: Within Functional Limits for tasks assessed                                 General Comments: WFL for simple tasks, some decreased insight into deficits and requires encouragement to participate in ADLs (hygiene); likely baseline        Exercises     Shoulder Instructions       General Comments SpO2 >925 on RA, HR ranged from 80 up to 130 briefly during grooming at sink    Pertinent Vitals/ Pain       Pain Assessment: Faces Faces Pain Scale: Hurts a little bit Pain Location: R knee (attributes to sciatica) Pain Descriptors / Indicators: Throbbing Pain Intervention(s): Monitored during session;Repositioned  Home Living                                          Prior Functioning/Environment              Frequency  Min 2X/week        Progress Toward Goals  OT Goals(current goals can now be found in the care plan section)  Progress towards OT goals: Progressing toward goals  Acute Rehab OT Goals Patient Stated Goal: Return home, decreased pain OT Goal Formulation: With patient  Plan Discharge plan remains appropriate;Frequency remains appropriate    Co-evaluation                 AM-PAC  OT "6 Clicks" Daily Activity     Outcome Measure   Help from another person eating meals?: None Help from another person taking care of personal grooming?: A Little Help from another person toileting, which includes using toliet, bedpan, or urinal?: A Little Help from another person bathing (including washing, rinsing, drying)?: A Little Help from another person to put on and taking off regular upper body clothing?: A Little Help from another person to put on and taking off regular lower body clothing?: A Little 6 Click Score: 19    End of Session Equipment Utilized During Treatment: Other (comment) (rollator)  OT Visit Diagnosis: Other abnormalities of gait and mobility (R26.89);Muscle weakness (generalized) (M62.81);Pain Pain - Right/Left: Right Pain - part of body: Leg   Activity Tolerance Patient tolerated treatment well   Patient Left in bed;with call bell/phone within reach;with bed alarm set;with nursing/sitter in room   Nurse Communication Mobility status        Time: 1123-1140 OT Time Calculation (min): 17 min  Charges: OT General Charges $OT Visit: 1 Visit OT Treatments $Self Care/Home Management : 8-22 mins  Jolaine Artist, [place] Pager [phone] Office [phone]    Delight Stare 10/17/2020, 1:13 PM

## 2020-10-17 NOTE — Care Management Important Message (Signed)
Important Message  Patient Details  Name: Grace Bradford MRN: [id number] Date of Birth: January 31, 1946   Medicare Important Message Given:  Yes     Falyn Rubel [address] 10/17/2020, 2:56 PM

## 2020-10-17 NOTE — Progress Notes (Addendum)
Advanced Heart Failure Rounding Note  PCP-Cardiologist: None   Subjective:   Admitted with AKI and volume overload. Started on IV lasix. RHC yesterday demonstrated moderately to severely elevated biventricular filling pressures (R>L) and severe mixed pulmonary HTN.  Echo EF <20%. RV moderately reduced. Severe TR. In comparison to prior study, EF is down from 40-45%.   I/Os not accurate due to incontinence. Now w/ [place]. Wt up 1 lb.   SCr continues to trend down 3.35>>2.80>>2.6 today. Remains fluid overloaded + orthopnea overnight.   K 3.9. Now rare PVCs   RHC  Findings:   RA = 15 RV = 78/24 PA = 86/33 (52) PCW = 22 Fick cardiac output/index = 5.8/3.00 PVR = 5.0 Ao sat = 99% PA sat = 62%, 63%   Assessment: 1. Moderately to severely elevated biventricular filling pressures (R>L) 2. Severe mixed pulmonary HTN    Objective:   Weight Range: 93.1 kg Body mass index is 38.79 kg/m.   Vital Signs:   Temp:  [98.2 F (36.8 C)-98.6 F (37 C)] 98.6 F (37 C) (06/24 0341) Pulse Rate:  [66-86] 86 (06/24 0800) Resp:  [17-26] 21 (06/24 0800) BP: (123-150)/(51-123) 145/123 (06/24 0800) SpO2:  [2 %-100 %] 2 % (06/24 0800) Weight:  [93.1 kg] 93.1 kg (06/24 0341) Last BM Date: 10/16/20  Weight change: Filed Weights   10/15/20 0206 10/16/20 0058 10/17/20 0341  Weight: 94.7 kg 92.7 kg 93.1 kg    Intake/Output:   Intake/Output Summary (Last 24 hours) at 10/17/2020 1141 Last data filed at 10/17/2020 1000 Gross per 24 hour  Intake 123 ml  Output 1850 ml  Net -1727 ml      Physical Exam    General:  fatigued appearing, obese, No respiratory difficulty HEENT: normal Neck: supple. JVD 10 cm. Carotids 2+ bilat; no bruits. No lymphadenopathy or thyromegaly appreciated. Cor: PMI nondisplaced. Regular rate & rhythm. + TR murmur  Lungs: decreased BS at the bases, + upper lobe expiratory wheezing bilaterally  Abdomen: obese, soft, nontender, nondistended. No  hepatosplenomegaly. No bruits or masses. Good bowel sounds. Extremities: no cyanosis, clubbing, rash, 1+ bilateral LE edema + unna boots  Neuro: alert & oriented x 3, cranial nerves grossly intact. moves all 4 extremities w/o difficulty. Affect pleasant.   Telemetry   NSR w/ occasional PVCs.   EKG    No new EKG to review   Labs    CBC Recent Labs    10/14/20 1810 10/15/20 0214 10/15/20 1509  WBC 7.1 6.2  --   HGB 8.9* 9.1* 10.2*  10.5*  HCT 29.5* 30.2* 30.0*  31.0*  MCV 69.4* 69.4*  --   PLT PLATELET CLUMPS NOTED ON SMEAR, COUNT APPEARS DECREASED 122*  --    Basic Metabolic Panel Recent Labs    10/16/20 0257 10/16/20 1536 10/17/20 0246  NA 141 140 140  K 2.9* 3.7 3.9  CL 102 105 102  CO2 27 30 27   GLUCOSE 146* 203* 161*  BUN 67* 64* 61*  CREATININE 2.80* 2.66* 2.35*  CALCIUM 7.7* 7.3* 7.8*  MG 1.4*  --   --    Liver Function Tests No results for input(s): AST, ALT, ALKPHOS, BILITOT, PROT, ALBUMIN in the last 72 hours. No results for input(s): LIPASE, AMYLASE in the last 72 hours. Cardiac Enzymes No results for input(s): CKTOTAL, CKMB, CKMBINDEX, TROPONINI in the last 72 hours.  BNP: BNP (last 3 results) Recent Labs    09/03/20 1511 09/25/20 0842 10/14/20 1252  BNP 1,286.7*  1,752.8* 1,972.0*    ProBNP (last 3 results) No results for input(s): PROBNP in the last 8760 hours.   D-Dimer No results for input(s): DDIMER in the last 72 hours. Hemoglobin A1C No results for input(s): HGBA1C in the last 72 hours. Fasting Lipid Panel No results for input(s): CHOL, HDL, LDLCALC, TRIG, CHOLHDL, LDLDIRECT in the last 72 hours. Thyroid Function Tests Recent Labs    10/15/20 0214  TSH 1.269    Other results:   Imaging    ECHOCARDIOGRAM COMPLETE  Result Date: 10/16/2020    ECHOCARDIOGRAM REPORT   Patient Name:   TEPHANIE ESCORCIA Date of Exam: 10/16/2020 Medical Rec #:  [id number]      Height:       61.0 in Accession #:    [id number]     Weight:        204.3 lb Date of Birth:  1946-01-12      BSA:          1.906 m Patient Age:    75 years       BP:           128/79 mmHg Patient Gender: F              HR:           77 bpm. Exam Location:  Inpatient Procedure: 2D Echo, Cardiac Doppler and Color Doppler Indications:    CHF  History:        Patient has prior history of Echocardiogram examinations, most                 recent 07/29/2020. Cardiomyopathy and CHF, Previous Myocardial                 Infarction, Stroke and Pulmonary HTN; Risk Factors:Diabetes and                 Hypertension.  Sonographer:    Cammy Brochure Referring Phys: [phone] Taylor  1. Left ventricular ejection fraction, by estimation, is <20%. The left ventricle has severely decreased function. The left ventricle demonstrates global hypokinesis. The left ventricular internal cavity size was moderately dilated. Left ventricular diastolic parameters are consistent with Grade III diastolic dysfunction (restrictive).  2. Right ventricular systolic function is moderately reduced. The right ventricular size is moderately enlarged. There is severely elevated pulmonary artery systolic pressure.  3. Left atrial size was moderately dilated.  4. Right atrial size was mildly dilated.  5. The mitral valve is normal in structure. Trivial mitral valve regurgitation. No evidence of mitral stenosis.  6. Tricuspid valve regurgitation is severe.  7. The aortic valve is tricuspid. Aortic valve regurgitation is not visualized. No aortic stenosis is present.  8. The inferior vena cava is dilated in size with <50% respiratory variability, suggesting right atrial pressure of 15 mmHg. FINDINGS  Left Ventricle: Left ventricular ejection fraction, by estimation, is <20%. The left ventricle has severely decreased function. The left ventricle demonstrates global hypokinesis. Definity contrast agent was given IV to delineate the left ventricular endocardial borders. The left ventricular internal cavity  size was moderately dilated. There is no left ventricular hypertrophy. Left ventricular diastolic parameters are consistent with Grade III diastolic dysfunction (restrictive). Right Ventricle: The right ventricular size is moderately enlarged. Right ventricular systolic function is moderately reduced. There is severely elevated pulmonary artery systolic pressure. The tricuspid regurgitant velocity is 4.23 m/s, and with an assumed right atrial pressure of 15 mmHg, the estimated right ventricular systolic pressure  is 86.6 mmHg. Left Atrium: Left atrial size was moderately dilated. Right Atrium: Right atrial size was mildly dilated. Pericardium: There is no evidence of pericardial effusion. Mitral Valve: The mitral valve is normal in structure. Trivial mitral valve regurgitation. No evidence of mitral valve stenosis. Tricuspid Valve: The tricuspid valve is normal in structure. Tricuspid valve regurgitation is severe. No evidence of tricuspid stenosis. Aortic Valve: The aortic valve is tricuspid. Aortic valve regurgitation is not visualized. No aortic stenosis is present. Aortic valve mean gradient measures 4.0 mmHg. Aortic valve peak gradient measures 8.0 mmHg. Aortic valve area, by VTI measures 2.28 cm. Pulmonic Valve: The pulmonic valve was normal in structure. Pulmonic valve regurgitation is trivial. No evidence of pulmonic stenosis. Aorta: The aortic root is normal in size and structure. Venous: The inferior vena cava is dilated in size with less than 50% respiratory variability, suggesting right atrial pressure of 15 mmHg. IAS/Shunts: The interatrial septum is aneurysmal. No atrial level shunt detected by color flow Doppler.  LEFT VENTRICLE PLAX 2D LVIDd:         6.00 cm LVIDs:         5.10 cm LV PW:         1.20 cm LV IVS:        0.95 cm LVOT diam:     2.05 cm LV SV:         66 LV SV Index:   35 LVOT Area:     3.30 cm  RIGHT VENTRICLE            IVC RV Basal diam:  5.10 cm    IVC diam: 2.70 cm RV S prime:      6.53 cm/s LEFT ATRIUM             Index       RIGHT ATRIUM           Index LA diam:        4.10 cm 2.15 cm/m  RA Area:     21.30 cm LA Vol (A2C):   64.1 ml 33.63 ml/m RA Volume:   63.30 ml  33.21 ml/m LA Vol (A4C):   66.5 ml 34.89 ml/m LA Biplane Vol: 65.0 ml 34.10 ml/m  AORTIC VALVE AV Area (Vmax):    2.34 cm AV Area (Vmean):   2.27 cm AV Area (VTI):     2.28 cm AV Vmax:           141.00 cm/s AV Vmean:          93.000 cm/s AV VTI:            0.290 m AV Peak Grad:      8.0 mmHg AV Mean Grad:      4.0 mmHg LVOT Vmax:         100.00 cm/s LVOT Vmean:        64.000 cm/s LVOT VTI:          0.200 m LVOT/AV VTI ratio: 0.69  AORTA Ao Root diam: 2.80 cm Ao Asc diam:  3.30 cm MITRAL VALVE                TRICUSPID VALVE MV Area (PHT): 5.84 cm     TR Peak grad:   71.6 mmHg MV Decel Time: 130 msec     TR Vmax:        423.00 cm/s MV E velocity: 122.00 cm/s MV A velocity: 55.50 cm/s   SHUNTS MV E/A ratio:  2.20  Systemic VTI:  0.20 m                             Systemic Diam: 2.05 cm Kirk Ruths MD Electronically signed by Kirk Ruths MD Signature Date/Time: 10/16/2020/1:49:07 PM    Final      Medications:     Scheduled Medications:  allopurinol  50 mg Oral Daily   aspirin EC  81 mg Oral Daily   busPIRone  7.5 mg Oral BID   carvedilol  3.125 mg Oral BID WC   clopidogrel  75 mg Oral Daily   furosemide  80 mg Intravenous BID   heparin  5,000 Units Subcutaneous Q8H   hydrOXYzine  25 mg Oral QID   insulin aspart  0-5 Units Subcutaneous QHS   insulin aspart  0-9 Units Subcutaneous TID WC   ivabradine  7.5 mg Oral BID WC   levothyroxine  100 mcg Oral Once per day on Mon Tue Wed Thu Fri   multivitamin with minerals  1 tablet Oral Daily   pantoprazole  40 mg Oral Daily   pravastatin  80 mg Oral q1800   pregabalin  150 mg Oral QHS   pregabalin  75 mg Oral Daily   sildenafil  60 mg Oral TID   sodium chloride flush  3 mL Intravenous Q12H    Infusions:  sodium chloride      PRN  Medications: sodium chloride, acetaminophen, albuterol, albuterol, fluticasone, sodium chloride flush    Patient Profile   Kimble is a 75 y/o woman with obesity, DM2, HTN, HL, CRI and HF with 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 Echo 3/18 EF 45-50%.  Admitted with AKI and volume overload.   Assessment/Plan  1. Acute on chronic Systolic Heart Failure - NICM, EF 30% (10/2014).  --> Echo 06/2016, EF 45-50% - Echo 4/19 EF 40-45% - Echo 3/21 EF 35-40% moderate RV dysfunction - Diuretics had been cut back since 2/22 after admit for dehydration and AKI. SCr peaked to 4.5 - Admitted with volume overload. Responding well w/ IV Lasix but still fluid overloaded  - Echo w/ EF now < 20%, RV moderately reduced. Severe MR  - RHC w/ moderately to severely elevated biventricular filling pressures (R>L). RA 15. PCW 22. CO/CI normal - Continue IV Lasix 80 mg bid and supp K  - Add 2.5 metolazone x 1  - Given narrow euvolemic window may be approaching time to consider HD - Hold Farxiga w/ worsening SCr. - Continue Coreg to 3.125 mg bid  - Continue Sildenafil (for RV dysjunction). - Off Spiro & dig w/ renal dysfunction. - Renal function better today. Follow w/ diuresis  - fluid restrict 1200 cc/day   2. 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 Hollie Salk - SCr baseline ~2.6 - Renal US showed mildly increased renal cortical cortical echogenicity but otherwise unremarkable. - Myeloma w/u negative    - Admit creatinine 3.9--> 3.35->2.8 ->2.6 ->2.4  - Follow BMET daily . - concerned that she may be getting close to time to prepare for HD with vein mapping   3. H/o Hypercalcemia  - Calcitrol and calcium replacement has been discontinued. - Bone survey w/ no evidence of lytic or sclerotic lesions. Myeloma w/u negative - Ca 7.8 today     4. Type 2 DM  - Controlled. Hgb A1c 6.1.  - Stop Farxiga. - SSI  5. PVCs/Ventricular Bigeminy  - resolved after correction of  hypokalemia  - Keep K > 4.0 and  Mg > 2.0   6. Severe TR - likely functional, RV moderately enlarged, moderately reduced systolic function   7. Hypomagnesemia - Mg 1.4 - supp w/ MgSO4 4 gm  - repeat lab in am   Length of Stay: [address] Ladoris Gene  10/17/2020, 11:41 AM  Advanced Heart Failure Team Pager [phone] (M-F; 7a - 5p)  Please contact [facility] for night-coverage after hours (5p -7a ) and weekends on [email or web address]  Patient seen and examined with the above-signed Advanced Practice Provider and/or Housestaff. I personally reviewed laboratory data, imaging studies and relevant notes. I independently examined the patient and formulated the important aspects of the plan. I have edited the note to reflect any of my changes or salient points. I have personally discussed the plan with the patient and/or family.  Modes diuresis with IV lasix but weight up a pound. Feeling better. Creatinine improving   General:  Obese woman sitting up in bed No resp difficulty HEENT: normal Neck: supple. JVP to jaw  Carotids 2+ bilat; no bruits. No lymphadenopathy or thryomegaly appreciated. Cor: PMI nondisplaced. Regular rate & rhythm. No rubs, gallops or murmurs. Lungs: clear Abdomen: soft, nontender, nondistended. No hepatosplenomegaly. No bruits or masses. Good bowel sounds. Extremities: no cyanosis, clubbing, rash, 1-2+ edema + UNNA Neuro: alert & orientedx3, cranial nerves grossly intact. moves all 4 extremities w/o difficulty. Affect pleasant  Remains fluid overloaded. SCr improving with IV diuresis but she has not lost much weight. Suspect fluid intake is higher than she is admitting to. Agree with metolazone. May need lasix gtt. Supp K & Mg.   Glori Bickers, MD  3:04 PM

## 2020-10-17 NOTE — Progress Notes (Signed)
Physical Therapy Treatment Patient Details Name: Grace Bradford MRN: [id number] DOB: 1945/07/11 Today's Date: 10/17/2020    History of Present Illness Pt is a 75 y.o. female admitted 10/14/20 with SOB. Workup for acute on chronic HF exacerbation, AKI. S/p RHC 6/22. PMH includes CHF, CVA, HTN, gout, MI, neuropathy, OSA, DM2, back pain.   PT Comments    Pt progressing with mobility. Today's session focused on transfers and ambulation as well as seated/standing ADL tasks at sink. Pt moving fairly well with rollator and intermittent min guard for balance; requires frequent seated rest breaks after short bouts of activity. Notable improvements in RLE pain, although pain, generalized weakness and decreased activity tolerance are still functional limitations. Will continue to follow acutely to address established goals.   Follow Up Recommendations  Home health PT;Supervision for mobility/OOB     Equipment Recommendations  None recommended by PT    Recommendations for Other Services       Precautions / Restrictions Precautions Precautions: Fall Restrictions Weight Bearing Restrictions: No    Mobility  Bed Mobility               General bed mobility comments: Received sitting in recliner    Transfers Overall transfer level: Needs assistance Equipment used: 4-wheeled walker;None Transfers: Sit to/from Stand Sit to Stand: Min guard         General transfer comment: Pt with good awareness to lock rollator brakes prior to standing; additional sit<>stands at sink from chair without DME, pt relying heavily on pulling up on sink with BUEs; min guard for balance  Ambulation/Gait Ambulation/Gait assistance: Min guard;Supervision Gait Distance (Feet): 40 Feet Assistive device: 4-wheeled walker Gait Pattern/deviations: Step-through pattern;Decreased stride length;Trunk flexed Gait velocity: Decreased   General Gait Details: Slow, fatigued gait with rollator and intermittent min  guard for balance, progressing to supervision for safety; very forward flexed posture, c/o R knee pain; declines further distance secondary to fatigue post-washup at sink   Chief Strategy Officer    Modified Rankin (Stroke Patients Only)       Balance Overall balance assessment: Needs assistance Sitting-balance support: No upper extremity supported;Feet supported Sitting balance-Leahy Scale: Good     Standing balance support: Bilateral upper extremity supported;During functional activity;Single extremity supported Standing balance-Leahy Scale: Poor Standing balance comment: Reliant on at least single UE support when performing bathing at sink; able to reach perineal area, but requires assist to reach mid back                            Cognition Arousal/Alertness: Awake/alert Behavior During Therapy: WFL for tasks assessed/performed Overall Cognitive Status: Within Functional Limits for tasks assessed                                 General Comments: WFL for simple tasks; question some decreased insight into function deficits; likely near baseline cognition      Exercises      General Comments General comments (skin integrity, edema, etc.): SpO2 >/92% on RA, HR 80s-90s      Pertinent Vitals/Pain Pain Assessment: Faces Faces Pain Scale: Hurts a little bit Pain Location: R knee (attributes to sciatica) Pain Descriptors / Indicators: Throbbing Pain Intervention(s): Monitored during session;Limited activity within patient's tolerance    Home Living  Prior Function            PT Goals (current goals can now be found in the care plan section) Progress towards PT goals: Progressing toward goals    Frequency    Min 3X/week      PT Plan Discharge plan needs to be updated    Co-evaluation              AM-PAC PT "6 Clicks" Mobility   Outcome Measure  Help needed turning from  your back to your side while in a flat bed without using bedrails?: None Help needed moving from lying on your back to sitting on the side of a flat bed without using bedrails?: A Little Help needed moving to and from a bed to a chair (including a wheelchair)?: A Little Help needed standing up from a chair using your arms (e.g., wheelchair or bedside chair)?: A Little Help needed to walk in hospital room?: A Little Help needed climbing 3-5 steps with a railing? : A Lot 6 Click Score: 18    End of Session   Activity Tolerance: Patient tolerated treatment well;Patient limited by fatigue Patient left: in chair;with call bell/phone within reach;with chair alarm set Nurse Communication: Mobility status PT Visit Diagnosis: Other abnormalities of gait and mobility (R26.89);Pain Pain - Right/Left: Right Pain - part of body: Leg     Time: 3149-7026 PT Time Calculation (min) (ACUTE ONLY): 23 min  Charges:  $Therapeutic Activity: 23-37 mins                     Mabeline Caras, PT, DPT Acute Rehabilitation Services  Pager [phone] Office [place] 10/17/2020, 12:47 PM

## 2020-10-17 NOTE — Progress Notes (Signed)
Inpatient Diabetes Program Recommendations  AACE/ADA: New Consensus Statement on Inpatient Glycemic Control   Target Ranges:  Prepandial:   less than 140 mg/dL      Peak postprandial:   less than 180 mg/dL (1-2 hours)      Critically ill patients:  140 - 180 mg/dL   Results for THAMARA, LEGER (MRN [id number]) as of 10/17/2020 10:38  Ref. Range 10/16/2020 05:44 10/16/2020 10:57 10/16/2020 15:42 10/16/2020 20:35 10/17/2020 05:16  Glucose-Capillary Latest Ref Range: 70 - 99 mg/dL 144 (H) 206 (H) 192 (H) 198 (H) 190 (H)   Review of Glycemic Control  Diabetes history: DM2 Outpatient Diabetes medications: Levemir 5 units QAM, Farxiga 5 mg daily, Trulicity 3 mg Qweek Current orders for Inpatient glycemic control: Novolog 0-9 units TID with meals, Novolog 0-5 units QHS  Inpatient Diabetes Program Recommendations:    Insulin: Please consider ordering Novolog 3 units TID with meals for meal coverage if patient eats at least 50% of meals.  Thanks, Barnie Alderman, RN, MSN, CDE Diabetes Coordinator Inpatient Diabetes Program [phone] (Team Pager from 8am to 5pm)

## 2020-10-18 LAB — BASIC METABOLIC PANEL
Anion gap: 8 (ref 5–15)
BUN: 57 mg/dL — ABNORMAL HIGH (ref 8–23)
CO2: 29 mmol/L (ref 22–32)
Calcium: 7.8 mg/dL — ABNORMAL LOW (ref 8.9–10.3)
Chloride: 101 mmol/L (ref 98–111)
Creatinine, Ser: 2.39 mg/dL — ABNORMAL HIGH (ref 0.44–1.00)
GFR, Estimated: 21 mL/min — ABNORMAL LOW (ref >60)
Glucose, Bld: 170 mg/dL — ABNORMAL HIGH (ref 70–99)
Potassium: 3.8 mmol/L (ref 3.5–5.1)
Sodium: 138 mmol/L (ref 135–145)

## 2020-10-18 LAB — GLUCOSE, CAPILLARY
Glucose-Capillary: 198 mg/dL — ABNORMAL HIGH (ref 70–99)
Glucose-Capillary: 157 mg/dL — ABNORMAL HIGH (ref 70–99)
Glucose-Capillary: 166 mg/dL — ABNORMAL HIGH (ref 70–99)
Glucose-Capillary: 216 mg/dL — ABNORMAL HIGH (ref 70–99)

## 2020-10-18 LAB — MAGNESIUM: Magnesium: 1.9 mg/dL (ref 1.7–2.4)

## 2020-10-18 MED ORDER — FUROSEMIDE 10 MG/ML IJ SOLN
30.0000 mg/h | INTRAVENOUS | Status: DC
  Administered 2020-10-18 – 2020-10-19 (×2): 20 mg/h via INTRAVENOUS
  Administered 2020-10-19 – 2020-10-21 (×5): 30 mg/h via INTRAVENOUS
  Filled 2020-10-18 (×10): qty 20

## 2020-10-18 NOTE — Progress Notes (Signed)
Arrived in room with pt in chair eating dinner. Pt states her PIV is not leaking. IV meds still infusing with no c/o. RN aware and educated to add another consult if IV begins to malfunction.

## 2020-10-18 NOTE — Progress Notes (Signed)
Advanced Heart Failure Rounding Note  PCP-Cardiologist: None   Subjective:   Admitted with AKI and volume overload. Started on IV lasix. RHC yesterday demonstrated moderately to severely elevated biventricular filling pressures (R>L) and severe mixed pulmonary HTN.  Echo EF <20%. RV moderately reduced. Severe TR. In comparison to prior study, EF is down from 40-45%.   Remains on IV lasix. I/Os even. Weight trending up. Still SOB with activity. Was confused overnight - pulled leads off. Creatinine now stabilized at 2.3  RHC  Findings:   RA = 15 RV = 78/24 PA = 86/33 (52) PCW = 22 Fick cardiac output/index = 5.8/3.00 PVR = 5.0 Ao sat = 99% PA sat = 62%, 63%   Assessment: 1. Moderately to severely elevated biventricular filling pressures (R>L) 2. Severe mixed pulmonary HTN    Objective:   Weight Range: 93.7 kg Body mass index is 39.04 kg/m.   Vital Signs:   Temp:  [97.7 F (36.5 C)-98.7 F (37.1 C)] 98.4 F (36.9 C) (06/25 0847) Pulse Rate:  [66-79] 79 (06/25 0847) Resp:  [18-22] 19 (06/25 0847) BP: (119-136)/(63-84) 127/63 (06/25 0847) SpO2:  [95 %-100 %] 95 % (06/25 0329) Weight:  [93.7 kg] 93.7 kg (06/25 0329) Last BM Date: 10/17/20  Weight change: Filed Weights   10/16/20 0058 10/17/20 0341 10/18/20 0329  Weight: 92.7 kg 93.1 kg 93.7 kg    Intake/Output:   Intake/Output Summary (Last 24 hours) at 10/18/2020 1437 Last data filed at 10/18/2020 1235 Gross per 24 hour  Intake 1020 ml  Output 1900 ml  Net -880 ml       Physical Exam    General:  Obese woman lying in bed. No resp difficulty HEENT: normal Neck: supple. JVP to jaw Carotids 2+ bilat; no bruits. No lymphadenopathy or thryomegaly appreciated. Cor: PMI nondisplaced. Regular rate & rhythm. 2/6 TR Lungs: clear Abdomen: soft, nontender, nondistended. No hepatosplenomegaly. No bruits or masses. Good bowel sounds. Extremities: no cyanosis, clubbing, rash, 2+ edema +TED Neuro: alert &  orientedx3, cranial nerves grossly intact. moves all 4 extremities w/o difficulty. Affect pleasant    Telemetry   NSR 70-80s Personally reviewed   Labs    CBC Recent Labs    10/15/20 1509  HGB 10.2*  10.5*  HCT 30.0*  31.0*    Basic Metabolic Panel Recent Labs    10/16/20 0257 10/16/20 1536 10/17/20 0246 10/18/20 0242  NA 141   < > 140 138  K 2.9*   < > 3.9 3.8  CL 102   < > 102 101  CO2 27   < > 27 29  GLUCOSE 146*   < > 161* 170*  BUN 67*   < > 61* 57*  CREATININE 2.80*   < > 2.35* 2.39*  CALCIUM 7.7*   < > 7.8* 7.8*  MG 1.4*  --   --  1.9   < > = values in this interval not displayed.    Liver Function Tests No results for input(s): AST, ALT, ALKPHOS, BILITOT, PROT, ALBUMIN in the last 72 hours. No results for input(s): LIPASE, AMYLASE in the last 72 hours. Cardiac Enzymes No results for input(s): CKTOTAL, CKMB, CKMBINDEX, TROPONINI in the last 72 hours.  BNP: BNP (last 3 results) Recent Labs    09/03/20 1511 09/25/20 0842 10/14/20 1252  BNP 1,286.7* 1,752.8* 1,972.0*     ProBNP (last 3 results) No results for input(s): PROBNP in the last 8760 hours.   D-Dimer No results for input(s):  DDIMER in the last 72 hours. Hemoglobin A1C No results for input(s): HGBA1C in the last 72 hours. Fasting Lipid Panel No results for input(s): CHOL, HDL, LDLCALC, TRIG, CHOLHDL, LDLDIRECT in the last 72 hours. Thyroid Function Tests No results for input(s): TSH, T4TOTAL, T3FREE, THYROIDAB in the last 72 hours.  Invalid input(s): FREET3   Other results:   Imaging    No results found.   Medications:     Scheduled Medications:  allopurinol  50 mg Oral Daily   aspirin EC  81 mg Oral Daily   busPIRone  7.5 mg Oral BID   carvedilol  3.125 mg Oral BID WC   clopidogrel  75 mg Oral Daily   diclofenac Sodium  2 g Topical QID   heparin  5,000 Units Subcutaneous Q8H   hydrOXYzine  25 mg Oral QID   insulin aspart  0-5 Units Subcutaneous QHS   insulin  aspart  0-9 Units Subcutaneous TID WC   ivabradine  7.5 mg Oral BID WC   levothyroxine  100 mcg Oral Once per day on Mon Tue Wed Thu Fri   multivitamin with minerals  1 tablet Oral Daily   pantoprazole  40 mg Oral Daily   potassium chloride  40 mEq Oral Daily   pravastatin  80 mg Oral q1800   pregabalin  150 mg Oral QHS   pregabalin  75 mg Oral Daily   sildenafil  60 mg Oral TID   sodium chloride flush  3 mL Intravenous Q12H    Infusions:  sodium chloride 250 mL (10/18/20 1247)   furosemide (LASIX) 200 mg in dextrose 5% 100 mL (2mg /mL) infusion 20 mg/hr (10/18/20 1248)    PRN Medications: sodium chloride, acetaminophen, albuterol, fluticasone, sodium chloride flush    Patient Profile   Grace Bradford is a 75 y/o woman with obesity, DM2, HTN, HL, CRI and HF with 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 Echo 3/18 EF 45-50%.  Admitted with AKI and volume overload.   Assessment/Plan  1. 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  --> Echo 06/2016, EF 45-50% - Echo 4/19 EF 40-45% - Echo 3/21 EF 35-40% moderate RV dysfunction - Diuretics had been cut back since 2/22 after admit for dehydration and AKI. SCr peaked to 4.5 - Admitted with volume overload. Responding well w/ IV Lasix but still fluid overloaded  - Echo w/ EF now < 20%, RV moderately reduced. Severe MR  - RHC w/ moderately to severely elevated biventricular filling pressures (R>L). RA 15. PCW 22. CO/CI normal - Only modest diuresis despite IV lasix and metolazone. Will switch to lasix gtt at 20.  - Given narrow euvolemic window may be approaching time to consider HD - Hold Farxiga w/ worsening SCr. - Continue Coreg to 3.125 mg bid  - Continue Sildenafil (for RV dysjunction). - Off Spiro & dig w/ renal dysfunction. - Continue fluid restriction  2. 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 Hollie Salk - SCr baseline ~2.6 - Renal US showed mildly increased renal cortical cortical  echogenicity but otherwise unremarkable. - Myeloma w/u negative    - Admit creatinine 3.9--> 3.35->2.8 ->2.6 ->2.4 -> 2.3 - Follow BMET daily . - concerned that she may be getting close to time to prepare for HD with vein mapping   3. H/o Hypercalcemia  - Calcitrol and calcium replacement has been discontinued. - Bone survey w/ no evidence of lytic or sclerotic lesions. Myeloma w/u negative -  Ca 7.8 today     4. Type 2 DM  - Controlled. Hgb A1c 6.1.  - Stop Farxiga. - SSI  5. PVCs/Ventricular Bigeminy  - resolved after correction of hypokalemia  - Keep K > 4.0 and Mg > 2.0   6. Severe TR - likely functional, RV moderately enlarged, moderately reduced systolic function   7. Hypomagnesemia - Mg 1.9 - will supp  Length of Stay: [place], MD  10/18/2020, 2:37 PM  Advanced Heart Failure Team Pager [phone] (M-F; 7a - 5p)  Please contact [facility] for night-coverage after hours (5p -7a ) and weekends on [email or web address]

## 2020-10-19 LAB — BASIC METABOLIC PANEL
Anion gap: 10 (ref 5–15)
BUN: 57 mg/dL — ABNORMAL HIGH (ref 8–23)
CO2: 30 mmol/L (ref 22–32)
Calcium: 8.2 mg/dL — ABNORMAL LOW (ref 8.9–10.3)
Chloride: 99 mmol/L (ref 98–111)
Creatinine, Ser: 2.13 mg/dL — ABNORMAL HIGH (ref 0.44–1.00)
GFR, Estimated: 24 mL/min — ABNORMAL LOW (ref >60)
Glucose, Bld: 144 mg/dL — ABNORMAL HIGH (ref 70–99)
Potassium: 3.7 mmol/L (ref 3.5–5.1)
Sodium: 139 mmol/L (ref 135–145)

## 2020-10-19 LAB — GLUCOSE, CAPILLARY
Glucose-Capillary: 158 mg/dL — ABNORMAL HIGH (ref 70–99)
Glucose-Capillary: 150 mg/dL — ABNORMAL HIGH (ref 70–99)
Glucose-Capillary: 199 mg/dL — ABNORMAL HIGH (ref 70–99)
Glucose-Capillary: 177 mg/dL — ABNORMAL HIGH (ref 70–99)

## 2020-10-19 MED ORDER — METOLAZONE 2.5 MG PO TABS
2.5000 mg | ORAL_TABLET | Freq: Once | ORAL | Status: AC
  Administered 2020-10-19: 2.5 mg via ORAL
  Filled 2020-10-19: qty 1

## 2020-10-19 MED ORDER — POTASSIUM CHLORIDE CRYS ER 20 MEQ PO TBCR
40.0000 meq | EXTENDED_RELEASE_TABLET | Freq: Once | ORAL | Status: AC
  Administered 2020-10-19: 40 meq via ORAL
  Filled 2020-10-19: qty 2

## 2020-10-19 MED ORDER — DIAZEPAM 5 MG PO TABS
5.0000 mg | ORAL_TABLET | Freq: Once | ORAL | Status: AC
  Administered 2020-10-19: 5 mg via ORAL
  Filled 2020-10-19: qty 1

## 2020-10-19 NOTE — Progress Notes (Signed)
Advanced Heart Failure Rounding Note  PCP-Cardiologist: None   Subjective:   Admitted with AKI and volume overload. Started on IV lasix. RHC yesterday demonstrated moderately to severely elevated biventricular filling pressures (R>L) and severe mixed pulmonary HTN.  Echo EF <20%. RV moderately reduced. Severe TR. In comparison to prior study, EF is down from 40-45%.   Lasix gtt at 20 started yesterday. Weight down 1 pound. Denies SOB. Wants to go home. SCr down to 2.1   Weight at 205 (was 188 at discharge in 5/22)  RHC    RA = 15 RV = 78/24 PA = 86/33 (52) PCW = 22 Fick cardiac output/index = 5.8/3.00 PVR = 5.0 Ao sat = 99% PA sat = 62%, 63%   Assessment: 1. Moderately to severely elevated biventricular filling pressures (R>L) 2. Severe mixed pulmonary HTN    Objective:   Weight Range: 93.2 kg Body mass index is 38.81 kg/m.   Vital Signs:   Temp:  [98.1 F (36.7 C)-98.8 F (37.1 C)] 98.4 F (36.9 C) (06/26 0746) Pulse Rate:  [65-75] 75 (06/26 0746) Resp:  [13-20] 20 (06/26 0746) BP: (119-149)/(55-71) 140/71 (06/26 0746) SpO2:  [99 %-100 %] 100 % (06/26 0746) Weight:  [93.2 kg] 93.2 kg (06/26 0300) Last BM Date: 10/18/20  Weight change: Filed Weights   10/17/20 0341 10/18/20 0329 10/19/20 0300  Weight: 93.1 kg 93.7 kg 93.2 kg    Intake/Output:   Intake/Output Summary (Last 24 hours) at 10/19/2020 1142 Last data filed at 10/19/2020 0931 Gross per 24 hour  Intake 1425.75 ml  Output 1700 ml  Net -274.25 ml       Physical Exam    General:  Sitting up in chair. No resp difficulty HEENT: normal Neck: supple. JVP to ear Carotids 2+ bilat; no bruits. No lymphadenopathy or thryomegaly appreciated. Cor: PMI nondisplaced. Regular rate & rhythm. 2/6 TR Lungs: clear Abdomen: obese soft, nontender, nondistended. No hepatosplenomegaly. No bruits or masses. Good bowel sounds. Extremities: no cyanosis, clubbing, rash, 1+ edema + TED Neuro: alert &  orientedx3, cranial nerves grossly intact. moves all 4 extremities w/o difficulty. Affect pleasant   Telemetry   NSR 70-80s Personally reviewed   Labs    CBC No results for input(s): WBC, NEUTROABS, HGB, HCT, MCV, PLT in the last 72 hours.  Basic Metabolic Panel Recent Labs    10/18/20 0242 10/19/20 0334  NA 138 139  K 3.8 3.7  CL 101 99  CO2 29 30  GLUCOSE 170* 144*  BUN 57* 57*  CREATININE 2.39* 2.13*  CALCIUM 7.8* 8.2*  MG 1.9  --     Liver Function Tests No results for input(s): AST, ALT, ALKPHOS, BILITOT, PROT, ALBUMIN in the last 72 hours. No results for input(s): LIPASE, AMYLASE in the last 72 hours. Cardiac Enzymes No results for input(s): CKTOTAL, CKMB, CKMBINDEX, TROPONINI in the last 72 hours.  BNP: BNP (last 3 results) Recent Labs    09/03/20 1511 09/25/20 0842 10/14/20 1252  BNP 1,286.7* 1,752.8* 1,972.0*     ProBNP (last 3 results) No results for input(s): PROBNP in the last 8760 hours.   D-Dimer No results for input(s): DDIMER in the last 72 hours. Hemoglobin A1C No results for input(s): HGBA1C in the last 72 hours. Fasting Lipid Panel No results for input(s): CHOL, HDL, LDLCALC, TRIG, CHOLHDL, LDLDIRECT in the last 72 hours. Thyroid Function Tests No results for input(s): TSH, T4TOTAL, T3FREE, THYROIDAB in the last 72 hours.  Invalid input(s): FREET3  Other results:   Imaging    No results found.   Medications:     Scheduled Medications:  allopurinol  50 mg Oral Daily   aspirin EC  81 mg Oral Daily   busPIRone  7.5 mg Oral BID   carvedilol  3.125 mg Oral BID WC   clopidogrel  75 mg Oral Daily   diazepam  5 mg Oral Once   diclofenac Sodium  2 g Topical QID   heparin  5,000 Units Subcutaneous Q8H   hydrOXYzine  25 mg Oral QID   insulin aspart  0-5 Units Subcutaneous QHS   insulin aspart  0-9 Units Subcutaneous TID WC   ivabradine  7.5 mg Oral BID WC   levothyroxine  100 mcg Oral Once per day on Mon Tue Wed Thu Fri    metolazone  2.5 mg Oral Once   multivitamin with minerals  1 tablet Oral Daily   pantoprazole  40 mg Oral Daily   potassium chloride  40 mEq Oral Daily   potassium chloride  40 mEq Oral Once   pravastatin  80 mg Oral q1800   pregabalin  150 mg Oral QHS   pregabalin  75 mg Oral Daily   sildenafil  60 mg Oral TID   sodium chloride flush  3 mL Intravenous Q12H    Infusions:  sodium chloride 250 mL (10/18/20 1247)   furosemide (LASIX) 200 mg in dextrose 5% 100 mL (2mg /mL) infusion 30 mg/hr (10/19/20 1042)    PRN Medications: sodium chloride, acetaminophen, albuterol, fluticasone, sodium chloride flush    Patient Profile   Grace Bradford is a 75 y/o woman with obesity, DM2, HTN, HL, CRI and HF with 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 Echo 3/18 EF 45-50%.  Admitted with AKI and volume overload.   Assessment/Plan  1. 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  --> Echo 06/2016, EF 45-50% - Echo 4/19 EF 40-45% - Echo 3/21 EF 35-40% moderate RV dysfunction - Diuretics had been cut back since 2/22 after admit for dehydration and AKI. SCr peaked to 4.5 - Echo 6/22 w/ EF < 20%, RV moderately reduced. Severe TR  - RHC w/ moderately to severely elevated biventricular filling pressures (R>L). RA 15. PCW 22. CO/CI normal - Only modest diuresis on lasix gtt at 20.  - Weight still almost 20 pounds above recent baseline  (was 188 at discharge in 5/22. She is 205 today) - Increase lasix gtt to 30. Give dose metolazone - Given narrow euvolemic window may be approaching time to consider HD - Hold Farxiga w/ worsening SCr. - Continue Coreg to 3.125 mg bid  - Continue Sildenafil (for RV dysjunction). - Off Spiro & dig w/ renal dysfunction. - Continue fluid restriction  2. 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 Hollie Salk - SCr baseline ~2.6 - Renal US showed mildly increased renal cortical cortical echogenicity but otherwise unremarkable. - Myeloma  w/u negative    - Admit creatinine 3.9--> 3.35->2.8 ->2.6 ->2.4 -> 2.3 -> 2,1 - Follow BMET daily . - concerned that she may be getting close to time to prepare for HD with vein mapping   3. H/o Hypercalcemia  - Calcitrol and calcium replacement has been discontinued. - Bone survey w/ no evidence of lytic or sclerotic lesions. Myeloma w/u negative - Ca 7.8 today     4. Type 2 DM  - Controlled. Hgb A1c 6.1.  - Stop Farxiga. - SSI  5. PVCs/Ventricular Bigeminy  - resolved after correction of hypokalemia  - Keep K > 4.0 and Mg > 2.0   6. Severe TR - likely functional, RV moderately enlarged, moderately reduced systolic function   7. Hypomagnesemia - supped on 6/25  Length of Stay: [place], MD  10/19/2020, 11:42 AM  Advanced Heart Failure Team Pager [phone] (M-F; 7a - 5p)  Please contact [facility] for night-coverage after hours (5p -7a ) and weekends on [email or web address]

## 2020-10-20 LAB — BASIC METABOLIC PANEL
Anion gap: 12 (ref 5–15)
BUN: 60 mg/dL — ABNORMAL HIGH (ref 8–23)
CO2: 27 mmol/L (ref 22–32)
Calcium: 8.4 mg/dL — ABNORMAL LOW (ref 8.9–10.3)
Chloride: 98 mmol/L (ref 98–111)
Creatinine, Ser: 2.57 mg/dL — ABNORMAL HIGH (ref 0.44–1.00)
GFR, Estimated: 19 mL/min — ABNORMAL LOW (ref >60)
Glucose, Bld: 226 mg/dL — ABNORMAL HIGH (ref 70–99)
Potassium: 4.2 mmol/L (ref 3.5–5.1)
Sodium: 137 mmol/L (ref 135–145)

## 2020-10-20 LAB — URINALYSIS, ROUTINE W REFLEX MICROSCOPIC
Bilirubin Urine: NEGATIVE
Glucose, UA: NEGATIVE mg/dL
Hgb urine dipstick: NEGATIVE
Ketones, ur: NEGATIVE mg/dL
Leukocytes,Ua: NEGATIVE
Nitrite: NEGATIVE
Protein, ur: NEGATIVE mg/dL
Specific Gravity, Urine: 1.006 (ref 1.005–1.030)
pH: 5 (ref 5.0–8.0)

## 2020-10-20 LAB — GLUCOSE, CAPILLARY
Glucose-Capillary: 227 mg/dL — ABNORMAL HIGH (ref 70–99)
Glucose-Capillary: 185 mg/dL — ABNORMAL HIGH (ref 70–99)
Glucose-Capillary: 207 mg/dL — ABNORMAL HIGH (ref 70–99)
Glucose-Capillary: 165 mg/dL — ABNORMAL HIGH (ref 70–99)

## 2020-10-20 LAB — MAGNESIUM: Magnesium: 1.6 mg/dL — ABNORMAL LOW (ref 1.7–2.4)

## 2020-10-20 MED ORDER — MAGNESIUM SULFATE 2 GM/50ML IV SOLN
2.0000 g | Freq: Once | INTRAVENOUS | Status: AC
  Administered 2020-10-20: 2 g via INTRAVENOUS
  Filled 2020-10-20: qty 50

## 2020-10-20 MED ORDER — METOLAZONE 2.5 MG PO TABS
2.5000 mg | ORAL_TABLET | Freq: Once | ORAL | Status: AC
  Administered 2020-10-20: 2.5 mg via ORAL
  Filled 2020-10-20: qty 1
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
## 2020-10-20 NOTE — Progress Notes (Addendum)
Advanced Heart Failure Rounding Note  PCP-Cardiologist: None   Subjective:   Admitted with AKI and volume overload. Started on IV lasix. RHC yesterday demonstrated moderately to severely elevated biventricular filling pressures (R>L) and severe mixed pulmonary HTN.  Echo EF <20%. RV moderately reduced. Severe TR. In comparison to prior study, EF is down from 40-45%.   Yesterday lasix drip increased to 30 mg per hour and given dose of metolazone. I/O not accurate. Creatinine trending up 2.1>2.6.     Weight down 1 pound.  (was 188 at discharge in 5/22)  Wants to go home. Denies SOB.   RHC  10/15/20 RA = 15 RV = 78/24 PA = 86/33 (52) PCW = 22 Fick cardiac output/index = 5.8/3.00 PVR = 5.0 Ao sat = 99% PA sat = 62%, 63%  Assessment: 1. Moderately to severely elevated biventricular filling pressures (R>L) 2. Severe mixed pulmonary HTN    Objective:   Weight Range: 93.7 kg Body mass index is 39.04 kg/m.   Vital Signs:   Temp:  [97.6 F (36.4 C)-98.4 F (36.9 C)] 98.4 F (36.9 C) (06/27 0440) Pulse Rate:  [67-78] 75 (06/27 0440) Resp:  [16-20] 18 (06/27 0440) BP: (105-130)/(49-71) 127/61 (06/27 0440) SpO2:  [97 %-100 %] 99 % (06/27 0440) Weight:  [93.7 kg-94.2 kg] 93.7 kg (06/27 0905) Last BM Date: 10/18/20  Weight change: Filed Weights   10/19/20 0300 10/20/20 0440 10/20/20 0905  Weight: 93.2 kg 94.2 kg 93.7 kg    Intake/Output:   Intake/Output Summary (Last 24 hours) at 10/20/2020 0906 Last data filed at 10/20/2020 0356 Gross per 24 hour  Intake 903.24 ml  Output 1100 ml  Net -196.76 ml      Physical Exam    General:  Sitting in the chair. No resp difficulty HEENT: normal Neck: supple. JVP 10-11. Carotids 2+ bilat; no bruits. No lymphadenopathy or thryomegaly appreciated. Cor: PMI nondisplaced. Regular rate & rhythm. No rubs, gallops or murmurs. Lungs: clear Abdomen: obese, soft, nontender, nondistended. No hepatosplenomegaly. No bruits or  masses. Good bowel sounds. Extremities: no cyanosis, clubbing, rash, R and LLE unna boots.  Neuro: alert & orientedx3, cranial nerves grossly intact. moves all 4 extremities w/o difficulty. Affect pleasant   Telemetry  NSR with occasional PVCs 60-70s    Labs    CBC No results for input(s): WBC, NEUTROABS, HGB, HCT, MCV, PLT in the last 72 hours.  Basic Metabolic Panel Recent Labs    10/18/20 0242 10/19/20 0334 10/20/20 0356  NA 138 139 137  K 3.8 3.7 4.2  CL 101 99 98  CO2 29 30 27   GLUCOSE 170* 144* 226*  BUN 57* 57* 60*  CREATININE 2.39* 2.13* 2.57*  CALCIUM 7.8* 8.2* 8.4*  MG 1.9  --  1.6*   Liver Function Tests No results for input(s): AST, ALT, ALKPHOS, BILITOT, PROT, ALBUMIN in the last 72 hours. No results for input(s): LIPASE, AMYLASE in the last 72 hours. Cardiac Enzymes No results for input(s): CKTOTAL, CKMB, CKMBINDEX, TROPONINI in the last 72 hours.  BNP: BNP (last 3 results) Recent Labs    09/03/20 1511 09/25/20 0842 10/14/20 1252  BNP 1,286.7* 1,752.8* 1,972.0*    ProBNP (last 3 results) No results for input(s): PROBNP in the last 8760 hours.   D-Dimer No results for input(s): DDIMER in the last 72 hours. Hemoglobin A1C No results for input(s): HGBA1C in the last 72 hours. Fasting Lipid Panel No results for input(s): CHOL, HDL, LDLCALC, TRIG, CHOLHDL, LDLDIRECT in the  last 72 hours. Thyroid Function Tests No results for input(s): TSH, T4TOTAL, T3FREE, THYROIDAB in the last 72 hours.  Invalid input(s): FREET3   Other results:   Imaging    No results found.   Medications:     Scheduled Medications:  allopurinol  50 mg Oral Daily   aspirin EC  81 mg Oral Daily   busPIRone  7.5 mg Oral BID   carvedilol  3.125 mg Oral BID WC   clopidogrel  75 mg Oral Daily   diclofenac Sodium  2 g Topical QID   heparin  5,000 Units Subcutaneous Q8H   hydrOXYzine  25 mg Oral QID   insulin aspart  0-5 Units Subcutaneous QHS   insulin aspart   0-9 Units Subcutaneous TID WC   ivabradine  7.5 mg Oral BID WC   levothyroxine  100 mcg Oral Once per day on Mon Tue Wed Thu Fri   multivitamin with minerals  1 tablet Oral Daily   pantoprazole  40 mg Oral Daily   potassium chloride  40 mEq Oral Daily   pravastatin  80 mg Oral q1800   pregabalin  150 mg Oral QHS   pregabalin  75 mg Oral Daily   sildenafil  60 mg Oral TID   sodium chloride flush  3 mL Intravenous Q12H    Infusions:  sodium chloride 250 mL (10/18/20 1247)   furosemide (LASIX) 200 mg in dextrose 5% 100 mL (2mg /mL) infusion 30 mg/hr (10/20/20 0800)   magnesium sulfate bolus IVPB      PRN Medications: sodium chloride, acetaminophen, albuterol, fluticasone, sodium chloride flush    Patient Profile   Sheldon is a 75 y/o woman with obesity, DM2, HTN, HL, CRI and HF with 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 Echo 3/18 EF 45-50%.  Admitted with AKI and volume overload.   Assessment/Plan  1. 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  --> Echo 06/2016, EF 45-50% - Echo 4/19 EF 40-45% - Echo 3/21 EF 35-40% moderate RV dysfunction - Diuretics had been cut back since 2/22 after admit for dehydration and AKI. SCr peaked to 4.5 - Echo 6/22 w/ EF < 20%, RV moderately reduced. Severe TR  - RHC w/ moderately to severely elevated biventricular filling pressures (R>L). RA 15. PCW 22. CO/CI normal - Only modest diuresis on lasix gtt at 30+ metolazone. Will continue today. Weight down 1 pound.  - Given narrow euvolemic window may be approaching time to consider HD - Hold Farxiga w/ worsening SCr. - Continue Coreg to 3.125 mg bid  - Continue Sildenafil (for RV dysjunction). - Off Spiro & dig w/ renal dysfunction. - Continue fluid restriction  2. 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 Hollie Salk - SCr baseline ~2.6 - Renal US showed mildly increased renal cortical cortical echogenicity but otherwise unremarkable. - Myeloma w/u  negative    - Admit creatinine 3.9--> 3.35->2.8 ->2.6 ->2.4 -> 2.3 -> 2.1->2.6 - Follow BMET daily . - concerned that she may be getting close to time to prepare for HD with vein mapping   3. H/o Hypercalcemia  - Calcitrol and calcium replacement has been discontinued. - Bone survey w/ no evidence of lytic or sclerotic lesions. Myeloma w/u negative - Ca 8.4  today     4. Type 2 DM  - Controlled. Hgb A1c 6.1.  - Stop Farxiga. - SSI  5. PVCs/Ventricular Bigeminy  - resolved after correction of hypokalemia  -  Keep K > 4.0 and Mg > 2.0  - Mag 1.6. Give 2 grams Mag.   6. Severe TR - likely functional, RV moderately enlarged, moderately reduced systolic function   7. Hypomagnesemia - supped on 6/25 - Mag 1.6. Give 2 grams Mag.   Length of Stay: [place], NP  10/20/2020, 9:06 AM  Advanced Heart Failure Team Pager [phone] (M-F; 7a - 5p)  Please contact [facility] for night-coverage after hours (5p -7a ) and weekends on [email or web address]  Patient seen and examined with the above-signed Advanced Practice Provider and/or Housestaff. I personally reviewed laboratory data, imaging studies and relevant notes. I independently examined the patient and formulated the important aspects of the plan. I have edited the note to reflect any of my changes or salient points. I have personally discussed the plan with the patient and/or family.  Minimal urine output despite high-dose IV lasix. SCr worsening. Remains fluid overloaded on exam. Denies SOB, orthopnea or PND  General:  Sitting up in chair  No resp difficulty HEENT: normal Neck: supple. JVP to ear Carotids 2+ bilat; no bruits. No lymphadenopathy or thryomegaly appreciated. Cor: PMI nondisplaced. Regular rate & rhythm. 2/6 TR Lungs: clear Abdomen: soft, nontender, nondistended. No hepatosplenomegaly. No bruits or masses. Good bowel sounds. Extremities: no cyanosis, clubbing, rash, 1-2+ edema Neuro: alert & orientedx3, cranial nerves  grossly intact. moves all 4 extremities w/o difficulty. Affect pleasant  She has severe cardio-renal syndrome. I think she will need HD to manage her fluid but with severely reduced EF not sure she will be able to tolerate. Have consulted renal. Supp electrolytes.   Glori Bickers, MD  6:20 PM

## 2020-10-20 NOTE — Progress Notes (Signed)
Occupational Therapy Treatment Patient Details Name: Grace Bradford MRN: [id number] DOB: 15-Jan-1946 Today's Date: 10/20/2020    History of present illness Pt is a 75 y.o. female admitted 10/14/20 with SOB. Workup for acute on chronic HF exacerbation, AKI. S/p RHC 6/22. PMH includes CHF, CVA, HTN, gout, MI, neuropathy, OSA, DM2, back pain.   OT comments  Pt making steady progress towards OT goals this session. Pt received seated in recliner agreeable to OT intervention. Pt continues to present with increased pain, decreased activity tolerance, and generalized deconditioning. Pt currently requires MOD A for LB ADLS, min guard assist for functional mobility with rollator and is able to complete seated grooming tasks MOD I. Pt on RA during session with SpO2 >87% during session, pt with one episode of increased HR to 126 bpm with mobility but mostly WFL. Pt would continue to benefit from skilled occupational therapy while admitted and after d/c to address the below listed limitations in order to improve overall functional mobility and facilitate independence with BADL participation. DC plan remains appropriate, will follow acutely per POC.     Follow Up Recommendations  No OT follow up;Supervision - Intermittent    Equipment Recommendations  None recommended by OT    Recommendations for Other Services      Precautions / Restrictions Precautions Precautions: Fall Restrictions Weight Bearing Restrictions: No       Mobility Bed Mobility               General bed mobility comments: pt up in recliner at beginning of session and left up in recliner at end of session    Transfers Overall transfer level: Needs assistance Equipment used: 4-wheeled walker (bari rollator) Transfers: Sit to/from Stand Sit to Stand: Min guard         General transfer comment: min guard for safety/balance and line mgmt    Balance Overall balance assessment: Needs assistance Sitting-balance support:  No upper extremity supported;Feet supported Sitting balance-Leahy Scale: Good     Standing balance support: Bilateral upper extremity supported Standing balance-Leahy Scale: Poor Standing balance comment: reliant on BUE support for functional mobility                           ADL either performed or assessed with clinical judgement   ADL Overall ADL's : Needs assistance/impaired     Grooming: Wash/dry face;Oral care;Sitting;Modified independent   Upper Body Bathing: Maximal assistance;Sitting Upper Body Bathing Details (indicate cue type and reason): MAX A to don lotion on pts back         Lower Body Dressing: Moderate assistance;Sit to/from stand Lower Body Dressing Details (indicate cue type and reason): to thread pants and pull to waist line, pt reports at home she is able to don pants independently Toilet Transfer: Min guard;Ambulation (with rollator) Toilet Transfer Details (indicate cue type and reason): simulated via functional mobility from recliner<>sink       Tub/Shower Transfer Details (indicate cue type and reason): pt reports walkin in shower with seat at home Functional mobility during ADLs: Min guard (rollator) General ADL Comments: pt continues to present with increased pain,decreased activity tolerance and generalized deconditioning     Vision       Perception     Praxis      Cognition Arousal/Alertness: Awake/alert Behavior During Therapy: WFL for tasks assessed/performed Overall Cognitive Status: Within Functional Limits for tasks assessed  Exercises     Shoulder Instructions       General Comments pt on RA during session with SpO2 >87% during session, pt with one episode of increased HR to 126 bpm with mobility but mostly [facility]    Pertinent Vitals/ Pain       Pain Assessment: Faces Faces Pain Scale: Hurts a little bit Pain Location: R inner thigh Pain Descriptors /  Indicators: Sore Pain Intervention(s): Monitored during session;Repositioned;RN gave pain meds during session  Home Living                                          Prior Functioning/Environment              Frequency  Min 2X/week        Progress Toward Goals  OT Goals(current goals can now be found in the care plan section)  Progress towards OT goals: Progressing toward goals  Acute Rehab OT Goals Patient Stated Goal: go home OT Goal Formulation: With patient Time For Goal Achievement: 10/29/20 Potential to Achieve Goals: Good  Plan Discharge plan remains appropriate;Frequency remains appropriate    Co-evaluation                 AM-PAC OT "6 Clicks" Daily Activity     Outcome Measure   Help from another person eating meals?: None Help from another person taking care of personal grooming?: None Help from another person toileting, which includes using toliet, bedpan, or urinal?: A Little Help from another person bathing (including washing, rinsing, drying)?: A Little Help from another person to put on and taking off regular upper body clothing?: None Help from another person to put on and taking off regular lower body clothing?: A Little 6 Click Score: 21    End of Session Equipment Utilized During Treatment: Gait belt;Other (comment) (rollator)  OT Visit Diagnosis: Other abnormalities of gait and mobility (R26.89);Muscle weakness (generalized) (M62.81);Pain Pain - Right/Left: Right Pain - part of body: Leg   Activity Tolerance Patient tolerated treatment well   Patient Left in chair;with call bell/phone within reach   Nurse Communication Mobility status        Time: 3762-8315 OT Time Calculation (min): 66 min  Charges: OT General Charges $OT Visit: 1 Visit OT Treatments $Self Care/Home Management : 53-67 mins  Harley Alto., COTA/L Acute Rehabilitation Services [phone]    Precious Haws 10/20/2020,  10:49 AM

## 2020-10-20 NOTE — Progress Notes (Signed)
Orthopedic Tech Progress Note Patient Details:  Grace Bradford [birth year] [id number]  Ortho Devices Type of Ortho Device: Haematologist Ortho Device/Splint Location: bil Ortho Device/Splint Interventions: Ordered, Application   Post Interventions Patient Tolerated: Well (wanted them wrapped loser than initial application)  Unna boot applied bil.  RN covered smaller than pea sized open circular wound with foam dressing.  This therapist cut the dressing down to size as to not cause any increased pressure on other areas and unna boots applied looser than last time per pt request.  She tolerated application well and approved of the new tightness level.  Thanks,  Verdene Lennert, PT, DPT  Acute Rehabilitation Ortho Tech Supervisor [phone] pager #[phone] office     [name] 10/20/2020, 4:34 PM

## 2020-10-20 NOTE — Progress Notes (Signed)
Physical Therapy Treatment Patient Details Name: Grace Bradford MRN: [id number] DOB: [birth year] Today's Date: 10/20/2020    History of Present Illness Pt is a 75 y.o. female admitted 10/14/20 with SOB. Workup for acute on chronic HF exacerbation, AKI. S/p RHC 6/22. PMH includes CHF, CVA, HTN, gout, MI, neuropathy, OSA, DM2, back pain.    PT Comments    Today's skilled session focused on LE strengthening. Mobility (transfers and gait) not performed due to arrival of IV team nurse to start pt's IV. Acute PT to continue.   Follow Up Recommendations  Home health PT;Supervision for mobility/OOB     Equipment Recommendations  None recommended by PT    Recommendations for Other Services       Precautions / Restrictions Precautions Precautions: Fall Restrictions Weight Bearing Restrictions: No    Mobility  Bed Mobility               General bed mobility comments: pt up in recliner at beginning of session and left up in recliner at end of session    Transfers Overall transfer level: Needs assistance Equipment used: 4-wheeled walker (bari rollator) Transfers: Sit to/from Stand Sit to Stand: Min guard         General transfer comment: min guard for safety/balance and line mgmt         Balance Overall balance assessment: Needs assistance Sitting-balance support: No upper extremity supported;Feet supported Sitting balance-Leahy Scale: Good     Standing balance support: Bilateral upper extremity supported Standing balance-Leahy Scale: Poor Standing balance comment: reliant on BUE support for functional mobility                            Cognition Arousal/Alertness: Awake/alert Behavior During Therapy: WFL for tasks assessed/performed Overall Cognitive Status: Within Functional Limits for tasks assessed                          Exercises General Exercises - Lower Extremity Long Arc Quad: AAROM;Strengthening;Both;10 reps;Seated Hip  Flexion/Marching: AROM;Strengthening;Both;10 reps;Seated Toe Raises: AROM;Strengthening;Both;10 reps;Seated Heel Raises: AROM;Strengthening;Both;10 reps;Seated    General Comments General comments (skin integrity, edema, etc.): pt on RA during session with SpO2 >87% during session, pt with one episode of increased HR to 125 bpm with mobility but mostly [facility]      Pertinent Vitals/Pain Pain Assessment: No/denies pain Pain Score: 0-No pain Faces Pain Scale: Hurts a little bit Pain Location: R inner thigh Pain Descriptors / Indicators: Sore Pain Intervention(s): Monitored during session;Repositioned;RN gave pain meds during session     PT Goals (current goals can now be found in the care plan section) Acute Rehab PT Goals Patient Stated Goal: go home PT Goal Formulation: With patient Time For Goal Achievement: 10/29/20 Potential to Achieve Goals: Good Progress towards PT goals: Progressing toward goals    Frequency    Min 3X/week      PT Plan Current plan remains appropriate    AM-PAC PT "6 Clicks" Mobility   Outcome Measure  Help needed turning from your back to your side while in a flat bed without using bedrails?: None Help needed moving from lying on your back to sitting on the side of a flat bed without using bedrails?: A Little Help needed moving to and from a bed to a chair (including a wheelchair)?: A Little Help needed standing up from a chair using your arms (e.g., wheelchair or bedside chair)?: A Little Help  needed to walk in hospital room?: A Little Help needed climbing 3-5 steps with a railing? : A Lot 6 Click Score: 18    End of Session   Activity Tolerance: Patient tolerated treatment well;Patient limited by fatigue Patient left: in chair;with call bell/phone within reach;with chair alarm set;with nursing/sitter in room (with IV team RN in room) Nurse Communication: Mobility status PT Visit Diagnosis: Muscle weakness (generalized) (M62.81)     Time:  4665-9935 PT Time Calculation (min) (ACUTE ONLY): 13 min  Charges:  $Therapeutic Exercise: 8-22 mins                    Willow Ora, PTA, [facility] Acute Rehab Services Office- [phone] 10/20/20, 11:34 AM   Willow Ora 10/20/2020, 11:33 AM

## 2020-10-20 NOTE — Progress Notes (Signed)
Inpatient Diabetes Program Recommendations  AACE/ADA: New Consensus Statement on Inpatient Glycemic Control (2015)  Target Ranges:  Prepandial:   less than 140 mg/dL      Peak postprandial:   less than 180 mg/dL (1-2 hours)      Critically ill patients:  140 - 180 mg/dL   Results for Grace Bradford, Grace Bradford (MRN [id number]) as of 10/20/2020 14:00  Ref. Range 10/20/2020 05:52 10/20/2020 12:02  Glucose-Capillary Latest Ref Range: 70 - 99 mg/dL 227 (H) 185 (H)    Home DM Meds: Levemir 5 units QAM     Farxiga 5 mg daily     Trulicity 3 mg Qweek   Current orders: Novolog 0-9 units TID ac/hs     MD- Please consider adding Levemir 5 units Daily (home dose)     --Will follow patient during hospitalization--  Wyn Quaker RN, MSN, CDE Diabetes Coordinator Inpatient Glycemic Control Team Team Pager: [phone] (8a-5p)

## 2020-10-21 LAB — BASIC METABOLIC PANEL
Anion gap: 12 (ref 5–15)
BUN: 68 mg/dL — ABNORMAL HIGH (ref 8–23)
CO2: 26 mmol/L (ref 22–32)
Calcium: 8.7 mg/dL — ABNORMAL LOW (ref 8.9–10.3)
Chloride: 98 mmol/L (ref 98–111)
Creatinine, Ser: 2.4 mg/dL — ABNORMAL HIGH (ref 0.44–1.00)
GFR, Estimated: 21 mL/min — ABNORMAL LOW (ref >60)
Glucose, Bld: 173 mg/dL — ABNORMAL HIGH (ref 70–99)
Potassium: 4.2 mmol/L (ref 3.5–5.1)
Sodium: 136 mmol/L (ref 135–145)

## 2020-10-21 LAB — RENAL FUNCTION PANEL
Albumin: 2.7 g/dL — ABNORMAL LOW (ref 3.5–5.0)
Anion gap: 10 (ref 5–15)
BUN: 67 mg/dL — ABNORMAL HIGH (ref 8–23)
CO2: 29 mmol/L (ref 22–32)
Calcium: 8.5 mg/dL — ABNORMAL LOW (ref 8.9–10.3)
Chloride: 98 mmol/L (ref 98–111)
Creatinine, Ser: 2.36 mg/dL — ABNORMAL HIGH (ref 0.44–1.00)
GFR, Estimated: 21 mL/min — ABNORMAL LOW (ref >60)
Glucose, Bld: 156 mg/dL — ABNORMAL HIGH (ref 70–99)
Phosphorus: 3.4 mg/dL (ref 2.5–4.6)
Potassium: 3.8 mmol/L (ref 3.5–5.1)
Sodium: 137 mmol/L (ref 135–145)

## 2020-10-21 LAB — GLUCOSE, CAPILLARY
Glucose-Capillary: 147 mg/dL — ABNORMAL HIGH (ref 70–99)
Glucose-Capillary: 161 mg/dL — ABNORMAL HIGH (ref 70–99)
Glucose-Capillary: 148 mg/dL — ABNORMAL HIGH (ref 70–99)
Glucose-Capillary: 177 mg/dL — ABNORMAL HIGH (ref 70–99)

## 2020-10-21 LAB — MAGNESIUM
Magnesium: 1.7 mg/dL (ref 1.7–2.4)
Magnesium: 2.4 mg/dL (ref 1.7–2.4)

## 2020-10-21 MED ORDER — FUROSEMIDE 10 MG/ML IJ SOLN
80.0000 mg | Freq: Every day | INTRAMUSCULAR | Status: DC
  Administered 2020-10-21 – 2020-10-22 (×2): 80 mg via INTRAVENOUS
  Filled 2020-10-21 (×2): qty 8

## 2020-10-21 MED ORDER — MAGNESIUM SULFATE 4 GM/100ML IV SOLN
4.0000 g | Freq: Once | INTRAVENOUS | Status: AC
  Administered 2020-10-21: 4 g via INTRAVENOUS
  Filled 2020-10-21: qty 100

## 2020-10-21 NOTE — Consult Note (Addendum)
[facility] CM Inpatient Consult   10/21/2020  MEGGAN ONEEL [birth year] [id number]  [facility] [[facility]]  Accountable Care Organization [ACO] Patient:  Medicare CMS DCE  Patient screened for hospitalization with noted extreme high risk score for unplanned readmission risk and to assess for potential [facility]  [[facility]] Care Management service needs for post hospital transition.  Review of patient's medical record reveals patient is active with the Advanced Heart Failure team and with a (addendum) current] history with [facility] noted per AHF LCSW notes 10/17/20 and TOC RNCM 10/16/20 notes.  Plan:  Continue to follow progress and disposition to assess for post hospital care management needs.   For questions contact:   Charlesetta Shanks, RN BSN CCM [facility]  [phone] business mobile phone Toll free office [phone]  Fax number: [phone] [email or web address]

## 2020-10-21 NOTE — Progress Notes (Signed)
REDS Clip  READING= 40>41%  Antonietta Jewel, PharmD, [place] Clinical Pharmacist  Phone: [phone] 10/21/2020 3:03 PM  Please check AMION for all [place] phone numbers After 10:00 PM, call [facility] [phone]

## 2020-10-21 NOTE — Progress Notes (Addendum)
Advanced Heart Failure Rounding Note  PCP-Cardiologist: None   Subjective:   Admitted with AKI and volume overload. Started on IV lasix. RHC yesterday demonstrated moderately to severely elevated biventricular filling pressures (R>L) and severe mixed pulmonary HTN.  Echo EF <20%. RV moderately reduced. Severe TR. In comparison to prior study, EF is down from 40-45%.   6/27 Nephrology consulted. Lasix drip stopped earlier.  Improved diuresis noted.   Creatinine trending up 2.1>2.6.>2.4     Weight unchanged 206.5.  (was 75 at discharge in 5/22)  Feels better today. Denies SOB.    RHC  10/15/20 RA = 15 RV = 78/24 PA = 86/33 (52) PCW = 22 Fick cardiac output/index = 5.8/3.00 PVR = 5.0 Ao sat = 99% PA sat = 62%, 63%  Assessment: 1. Moderately to severely elevated biventricular filling pressures (R>L) 2. Severe mixed pulmonary HTN    Objective:   Weight Range: 93.7 kg Body mass index is 39.02 kg/m.   Vital Signs:   Temp:  [97.9 F (36.6 C)-98.7 F (37.1 C)] 98.2 F (36.8 C) (06/28 0727) Pulse Rate:  [68-79] 79 (06/28 0727) Resp:  [16-20] 18 (06/28 0727) BP: (107-127)/(46-104) 118/63 (06/28 0727) SpO2:  [98 %-100 %] 100 % (06/28 0727) Weight:  [93.7 kg] 93.7 kg (06/28 0140) Last BM Date: 10/18/20  Weight change: Filed Weights   10/20/20 0440 10/20/20 0905 10/21/20 0140  Weight: 94.2 kg 93.7 kg 93.7 kg    Intake/Output:   Intake/Output Summary (Last 24 hours) at 10/21/2020 1040 Last data filed at 10/21/2020 0926 Gross per 24 hour  Intake 290 ml  Output 2050 ml  Net -1760 ml      Physical Exam    General:  Sitting in the chair. No resp difficulty HEENT: normal Neck: supple. JVP 7-8 . Carotids 2+ bilat; no bruits. No lymphadenopathy or thryomegaly appreciated. Cor: PMI nondisplaced. Regular rate & rhythm. No rubs, gallops or murmurs. Lungs: clear Abdomen: soft, nontender, nondistended. No hepatosplenomegaly. No bruits or masses. Good bowel  sounds. Extremities: no cyanosis, clubbing, rash, R and LLE unna boots. 1+ edema in thighs.  Neuro: alert & orientedx3, cranial nerves grossly intact. moves all 4 extremities w/o difficulty. Affect pleasant   Telemetry  NSR 60-70s with occasional PVCs    Labs    CBC No results for input(s): WBC, NEUTROABS, HGB, HCT, MCV, PLT in the last 72 hours.  Basic Metabolic Panel Recent Labs    10/20/20 0356 10/21/20 0328  NA 137 137  K 4.2 3.8  CL 98 98  CO2 27 29  GLUCOSE 226* 156*  BUN 60* 67*  CREATININE 2.57* 2.36*  CALCIUM 8.4* 8.5*  MG 1.6* 1.7  PHOS  --  3.4   Liver Function Tests Recent Labs    10/21/20 0328  ALBUMIN 2.7*   No results for input(s): LIPASE, AMYLASE in the last 72 hours. Cardiac Enzymes No results for input(s): CKTOTAL, CKMB, CKMBINDEX, TROPONINI in the last 72 hours.  BNP: BNP (last 3 results) Recent Labs    09/03/20 1511 09/25/20 0842 10/14/20 1252  BNP 1,286.7* 1,752.8* 1,972.0*    ProBNP (last 3 results) No results for input(s): PROBNP in the last 8760 hours.   D-Dimer No results for input(s): DDIMER in the last 72 hours. Hemoglobin A1C No results for input(s): HGBA1C in the last 72 hours. Fasting Lipid Panel No results for input(s): CHOL, HDL, LDLCALC, TRIG, CHOLHDL, LDLDIRECT in the last 72 hours. Thyroid Function Tests No results for input(s): TSH, T4TOTAL,  T3FREE, THYROIDAB in the last 72 hours.  Invalid input(s): FREET3   Other results:   Imaging    No results found.   Medications:     Scheduled Medications:  allopurinol  50 mg Oral Daily   aspirin EC  81 mg Oral Daily   busPIRone  7.5 mg Oral BID   carvedilol  3.125 mg Oral BID WC   clopidogrel  75 mg Oral Daily   diclofenac Sodium  2 g Topical QID   heparin  5,000 Units Subcutaneous Q8H   hydrOXYzine  25 mg Oral QID   insulin aspart  0-5 Units Subcutaneous QHS   insulin aspart  0-9 Units Subcutaneous TID WC   ivabradine  7.5 mg Oral BID WC    levothyroxine  100 mcg Oral Once per day on Mon Tue Wed Thu Fri   multivitamin with minerals  1 tablet Oral Daily   pantoprazole  40 mg Oral Daily   potassium chloride  40 mEq Oral Daily   pravastatin  80 mg Oral q1800   pregabalin  150 mg Oral QHS   pregabalin  75 mg Oral Daily   sildenafil  60 mg Oral TID   sodium chloride flush  3 mL Intravenous Q12H    Infusions:  sodium chloride 250 mL (10/18/20 1247)   furosemide (LASIX) 200 mg in dextrose 5% 100 mL (2mg /mL) infusion 30 mg/hr (10/21/20 0005)    PRN Medications: sodium chloride, acetaminophen, albuterol, fluticasone, sodium chloride flush    Patient Profile   Grace Bradford is a 75 y/o woman with obesity, DM2, HTN, HL, CRI and HF with 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 Echo 3/18 EF 45-50%.  Admitted with AKI and volume overload.   Assessment/Plan  1. 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  --> Echo 06/2016, EF 45-50% - Echo 4/19 EF 40-45% - Echo 3/21 EF 35-40% moderate RV dysfunction - Diuretics had been cut back since 2/22 after admit for dehydration and AKI. SCr peaked to 4.5 - Echo 6/22 w/ EF < 20%, RV moderately reduced. Severe TR  - RHC w/ moderately to severely elevated biventricular filling pressures (R>L). RA 15. PCW 22. CO/CI normal - Nephrology evaluated. No indication for HD but may be headed that way.  - Lasix drip stopped. Volume status look better. Switch to intermittent IV lasix. ? 120 mg twice a day.  - Given narrow euvolemic window may be approaching time to consider HD - Hold Farxiga w/ worsening SCr. - Continue Coreg to 3.125 mg bid  - Continue Sildenafil (for RV dysjunction). - Off Spiro & dig w/ renal dysfunction. - Continue fluid restriction  2. 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 Hollie Salk - SCr baseline ~2.6 - Renal US showed mildly increased renal cortical cortical echogenicity but otherwise unremarkable. - Myeloma w/u negative    - Admit  creatinine 3.9--> 3.35->2.8 ->2.6 ->2.4 -> 2.3 -> 2.1->2.6->2.4 - Follow BMET daily . - concerned that she may be getting close to time to prepare for HD with vein mapping   3. H/o Hypercalcemia  - Calcitrol and calcium replacement has been discontinued. - Bone survey w/ no evidence of lytic or sclerotic lesions. Myeloma w/u negative - Ca 8.5  today     4. Type 2 DM  - Controlled. Hgb A1c 6.1.  - Stop Farxiga. - SSI  5. PVCs/Ventricular Bigeminy  - resolved after correction of hypokalemia  - Keep K > 4.0  and Mg > 2.0  - Mag 1.7.  - Give 4 grams mag.   6. Severe TR - likely functional, RV moderately enlarged, moderately reduced systolic function   7. Hypomagnesemia - supped on 6/25 - Mag 1.7. Give 4 grams Mag.   Length of Stay: [place], NP  10/21/2020, 10:40 AM  Advanced Heart Failure Team Pager [phone] (M-F; 7a - 5p)  Please contact [facility] for night-coverage after hours (5p -7a ) and weekends on [email or web address]   Patient seen and examined with the above-signed Advanced Practice Provider and/or Housestaff. I personally reviewed laboratory data, imaging studies and relevant notes. I independently examined the patient and formulated the important aspects of the plan. I have edited the note to reflect any of my changes or salient points. I have personally discussed the plan with the patient and/or family.  Remains on IV lasix. Weight unchanged. Feeling better. Denies orthopnea or PND. Weight still up about 15-20 pounds from baseline. REDs lung water reading 41% (normal 25-35%)  General:  Obese woman lying flat in bed No resp difficulty HEENT: normal Neck: supple. JVP up to jaw . Carotids 2+ bilat; no bruits. No lymphadenopathy or thryomegaly appreciated. Cor: PMI nondisplaced. Regular rate & rhythm. 2/6 TR Lungs: clear Abdomen: obese soft, nontender, nondistended. No hepatosplenomegaly. No bruits or masses. Good bowel sounds. Extremities: no cyanosis, clubbing,  rash, 1+ edema Neuro: alert & orientedx3, cranial nerves grossly intact. moves all 4 extremities w/o difficulty. Affect pleasant  Although weight up 15 pounds she doesn't look markedly fluid overloaded despite recent RHC and ReDS suggesting significant fluid overload. I suspect this is probably as good as we are going to get her without HD and I doubt she would tolerate HD well with sever biventricular failure. Can switch to high-dose oral diuretics tomorrow and see how she does. I suspect she is end-stage.   Glori Bickers, MD  4:38 PM

## 2020-10-21 NOTE — Progress Notes (Signed)
[facility] NEPHROLOGY PROGRESS NOTE  Assessment/ Plan: Pt is a 75 y.o. yo female  with history of hypertension, HLD, DM, OSA, stroke, CAD, CHF who was admitted with CHF exacerbation on 6/22 treated with IV diuretics, seen as a consultation for the management of CKD stage IV and fluid management.  She follows with Dr. Hollie Salk at [facility].  #AKI on CKD stage IV, nonoliguric: It seems like the baseline creatinine level anywhere between 2-3.  UA bland.  Creatinine level 3.87 on admission which was improved initially with diuretics.  She has worsening heart failure EF <20 % with increased filling pressure.  She has been diuresing with IV Lasix and metolazone as per cardiology.  Net negative by 9.5 L since admission.  Creatinine level trending down to 2.36 today.  I would recommend to discontinue Lasix drip and switch to intermittent dosing. She is on room air and has no overt uremic symptoms. I agree that she may be heading towards dialysis soon if no improvement in her renal function or difficulty titrating diuretics to maintain euvolemia.  Patient is of course hesitant to do dialysis however she is amenable to it when needed.  I am concerned that it may be challenging for her because of poor cardiac function.   Daily lab, Strict ins and out, avoid hypotension, IV contrast.   #Acute on chronic heart failure both right and left heart.  Recent EF with EF < 20%, severe TR and moderately reduced RV function.  So far responding with diuretics.  No peripheral edema and on room air.  Heart failure team is managing diuretics. Agree with holding Farxiga and Aldactone.   #Hypertension/volume: Continue current cardiac medication including carvedilol.  Diuretics as above.  BP acceptable.   #Anemia of CKD: Hemoglobin at goal.  Monitor lab.   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   Subjective: Seen and examined at bedside.  Urine output around 2.2 L with high-dose of diuretics.  Denies nausea vomiting  chest pain.  Stable shortness of breath.  No new event. Objective Vital signs in last 24 hours: Vitals:   10/21/20 0009 10/21/20 0140 10/21/20 0317 10/21/20 0727  BP: 127/61  (!) 121/46 118/63  Pulse: 72  70 79  Resp: 18  16 18   Temp: 98.2 F (36.8 C)  98.6 F (37 C) 98.2 F (36.8 C)  TempSrc: Oral  Oral Oral  SpO2: 100%  100% 100%  Weight:  93.7 kg    Height:       Weight change: -0.454 kg  Intake/Output Summary (Last 24 hours) at 10/21/2020 0919 Last data filed at 10/21/2020 0817 Gross per 24 hour  Intake 290 ml  Output 1800 ml  Net -1510 ml       Labs: Basic Metabolic Panel: Recent Labs  Lab 10/19/20 0334 10/20/20 0356 10/21/20 0328  NA 139 137 137  K 3.7 4.2 3.8  CL 99 98 98  CO2 30 27 29   GLUCOSE 144* 226* 156*  BUN 57* 60* 67*  CREATININE 2.13* 2.57* 2.36*  CALCIUM 8.2* 8.4* 8.5*  PHOS  --   --  3.4   Liver Function Tests: Recent Labs  Lab 10/21/20 0328  ALBUMIN 2.7*   No results for input(s): LIPASE, AMYLASE in the last 168 hours. No results for input(s): AMMONIA in the last 168 hours. CBC: Recent Labs  Lab 10/14/20 1810 10/15/20 0214 10/15/20 1509  WBC 7.1 6.2  --   HGB 8.9* 9.1* 10.2*  10.5*  HCT 29.5* 30.2* 30.0*  31.0*  MCV 69.4* 69.4*  --   PLT PLATELET CLUMPS NOTED ON SMEAR, COUNT APPEARS DECREASED 122*  --    Cardiac Enzymes: No results for input(s): CKTOTAL, CKMB, CKMBINDEX, TROPONINI in the last 168 hours. CBG: Recent Labs  Lab 10/20/20 0552 10/20/20 1202 10/20/20 1654 10/20/20 2016 10/21/20 0550  GLUCAP 227* 185* 207* 165* 147*    Iron Studies: No results for input(s): IRON, TIBC, TRANSFERRIN, FERRITIN in the last 72 hours. Studies/Results: No results found.  Medications: Infusions:  sodium chloride 250 mL (10/18/20 1247)   furosemide (LASIX) 200 mg in dextrose 5% 100 mL (2mg /mL) infusion 30 mg/hr (10/21/20 0005)    Scheduled Medications:  allopurinol  50 mg Oral Daily   aspirin EC  81 mg Oral Daily    busPIRone  7.5 mg Oral BID   carvedilol  3.125 mg Oral BID WC   clopidogrel  75 mg Oral Daily   diclofenac Sodium  2 g Topical QID   heparin  5,000 Units Subcutaneous Q8H   hydrOXYzine  25 mg Oral QID   insulin aspart  0-5 Units Subcutaneous QHS   insulin aspart  0-9 Units Subcutaneous TID WC   ivabradine  7.5 mg Oral BID WC   levothyroxine  100 mcg Oral Once per day on Mon Tue Wed Thu Fri   multivitamin with minerals  1 tablet Oral Daily   pantoprazole  40 mg Oral Daily   potassium chloride  40 mEq Oral Daily   pravastatin  80 mg Oral q1800   pregabalin  150 mg Oral QHS   pregabalin  75 mg Oral Daily   sildenafil  60 mg Oral TID   sodium chloride flush  3 mL Intravenous Q12H    have reviewed scheduled and prn medications.  Physical Exam: General:NAD, comfortable Heart:RRR, s1s2 nl, no rubs Lungs:clear b/l, no crackle Abdomen:soft, Non-tender, non-distended Extremities:No LE edema Neurology: Alert, awake, no asterixis  Grace Bradford 10/21/2020,9:19 AM  LOS: 7 days

## 2020-10-22 LAB — GLUCOSE, CAPILLARY
Glucose-Capillary: 198 mg/dL — ABNORMAL HIGH (ref 70–99)
Glucose-Capillary: 216 mg/dL — ABNORMAL HIGH (ref 70–99)
Glucose-Capillary: 213 mg/dL — ABNORMAL HIGH (ref 70–99)
Glucose-Capillary: 158 mg/dL — ABNORMAL HIGH (ref 70–99)

## 2020-10-22 LAB — RENAL FUNCTION PANEL
Albumin: 2.8 g/dL — ABNORMAL LOW (ref 3.5–5.0)
Anion gap: 10 (ref 5–15)
BUN: 72 mg/dL — ABNORMAL HIGH (ref 8–23)
CO2: 28 mmol/L (ref 22–32)
Calcium: 8.6 mg/dL — ABNORMAL LOW (ref 8.9–10.3)
Chloride: 98 mmol/L (ref 98–111)
Creatinine, Ser: 2.36 mg/dL — ABNORMAL HIGH (ref 0.44–1.00)
GFR, Estimated: 21 mL/min — ABNORMAL LOW (ref >60)
Glucose, Bld: 195 mg/dL — ABNORMAL HIGH (ref 70–99)
Phosphorus: 3.5 mg/dL (ref 2.5–4.6)
Potassium: 3.5 mmol/L (ref 3.5–5.1)
Sodium: 136 mmol/L (ref 135–145)

## 2020-10-22 LAB — MAGNESIUM: Magnesium: 2.3 mg/dL (ref 1.7–2.4)

## 2020-10-22 MED ORDER — FUROSEMIDE 80 MG PO TABS
80.0000 mg | ORAL_TABLET | Freq: Two times a day (BID) | ORAL | Status: DC
  Administered 2020-10-22: 80 mg via ORAL
  Filled 2020-10-22: qty 1

## 2020-10-22 MED ORDER — METOLAZONE 2.5 MG PO TABS
2.5000 mg | ORAL_TABLET | Freq: Once | ORAL | Status: AC
  Administered 2020-10-22: 2.5 mg via ORAL
  Filled 2020-10-22: qty 1

## 2020-10-22 MED ORDER — POTASSIUM CHLORIDE CRYS ER 20 MEQ PO TBCR
40.0000 meq | EXTENDED_RELEASE_TABLET | Freq: Once | ORAL | Status: AC
  Administered 2020-10-22: 40 meq via ORAL
  Filled 2020-10-22: qty 2

## 2020-10-22 MED ORDER — DIAZEPAM 2 MG PO TABS
2.0000 mg | ORAL_TABLET | Freq: Once | ORAL | Status: AC | PRN
  Administered 2020-10-22: 2 mg via ORAL
  Filled 2020-10-22: qty 1

## 2020-10-22 MED ORDER — TORSEMIDE 20 MG PO TABS
60.0000 mg | ORAL_TABLET | Freq: Two times a day (BID) | ORAL | Status: DC
  Administered 2020-10-22 – 2020-10-24 (×4): 60 mg via ORAL
  Filled 2020-10-22 (×4): qty 3

## 2020-10-22 NOTE — Progress Notes (Addendum)
[facility] NEPHROLOGY PROGRESS NOTE  Assessment/ Plan: Pt is a 75 y.o. yo female  with history of hypertension, HLD, DM, OSA, stroke, CAD, CHF who was admitted with CHF exacerbation on 6/22 treated with IV diuretics, seen as a consultation for the management of CKD stage IV and fluid management.  She follows with Dr. Hollie Salk at [facility].  #AKI on CKD stage IV, nonoliguric: It seems like the baseline creatinine level anywhere between 2-3.  UA bland.  Creatinine level 3.87 on admission which was improved initially with diuretics.  She has worsening heart failure EF <20 % with increased filling pressure.  She has been diuresing with IV Lasix and metolazone as per cardiology.  Net negative by 10.6 L since admission.   The creatinine level continue to downtrend to 2.3 today and she is close to euvolemia.  I think we can start her oral diuretics today.  She thinks torsemide is not working and wants oral furosemide therefore I will switch her oral diuretics to furosemide 80 mg twice a day.  We will arrange her to follow-up with Dr. Hollie Salk at [facility] after discharge.  She has no features of uremia and no need for dialysis at this time.  I am concerned that the dialysis may be challenging for her because of poor cardiac function.     #Acute on chronic heart failure both right and left heart.  Recent EF with EF < 20%, severe TR and moderately reduced RV function.  So far responding with diuretics.  No peripheral edema and on room air.  Oral diuretics as above.  I recommend to hold Farxiga and Aldactone on discharge given low GFR.  #Hypertension/volume: Continue current cardiac medication including carvedilol.  Diuretics as above.  BP acceptable.   #Anemia of CKD: Hemoglobin at goal.  Monitor lab.   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   Subjective: Seen and examined at bedside.  The urine output is around 1.8 L.  She reports feeling good.  Denies nausea, vomiting, dysgeusia, chest  pain, shortness of breath.  She wants to go home today. Objective Vital signs in last 24 hours: Vitals:   10/21/20 2357 10/22/20 0226 10/22/20 0352 10/22/20 0749  BP: (!) 127/59  (!) 142/66   Pulse: 72  74   Resp: 20  18 20   Temp: 98.5 F (36.9 C)  98.7 F (37.1 C) 98.4 F (36.9 C)  TempSrc: Oral   Oral  SpO2: 100%  97%   Weight:  93.2 kg    Height:       Weight change: -0.544 kg  Intake/Output Summary (Last 24 hours) at 10/22/2020 1028 Last data filed at 10/22/2020 0559 Gross per 24 hour  Intake 600 ml  Output 1578 ml  Net -978 ml        Labs: Basic Metabolic Panel: Recent Labs  Lab 10/21/20 0328 10/21/20 1948 10/22/20 0333  NA 137 136 136  K 3.8 4.2 3.5  CL 98 98 98  CO2 29 26 28   GLUCOSE 156* 173* 195*  BUN 67* 68* 72*  CREATININE 2.36* 2.40* 2.36*  CALCIUM 8.5* 8.7* 8.6*  PHOS 3.4  --  3.5    Liver Function Tests: Recent Labs  Lab 10/21/20 0328 10/22/20 0333  ALBUMIN 2.7* 2.8*    No results for input(s): LIPASE, AMYLASE in the last 168 hours. No results for input(s): AMMONIA in the last 168 hours. CBC: Recent Labs  Lab 10/15/20 1509  HGB 10.2*  10.5*  HCT 30.0*  31.0*    Cardiac Enzymes: No results for input(s): CKTOTAL, CKMB, CKMBINDEX, TROPONINI in the last 168 hours. CBG: Recent Labs  Lab 10/21/20 0550 10/21/20 1219 10/21/20 1753 10/21/20 2040 10/22/20 0602  GLUCAP 147* 161* 148* 177* 198*     Iron Studies: No results for input(s): IRON, TIBC, TRANSFERRIN, FERRITIN in the last 72 hours. Studies/Results: No results found.  Medications: Infusions:  sodium chloride 250 mL (10/18/20 1247)    Scheduled Medications:  allopurinol  50 mg Oral Daily   aspirin EC  81 mg Oral Daily   busPIRone  7.5 mg Oral BID   carvedilol  3.125 mg Oral BID WC   clopidogrel  75 mg Oral Daily   diclofenac Sodium  2 g Topical QID   furosemide  80 mg Oral BID   heparin  5,000 Units Subcutaneous Q8H   hydrOXYzine  25 mg Oral QID   insulin  aspart  0-5 Units Subcutaneous QHS   insulin aspart  0-9 Units Subcutaneous TID WC   ivabradine  7.5 mg Oral BID WC   levothyroxine  100 mcg Oral Once per day on Mon Tue Wed Thu Fri   multivitamin with minerals  1 tablet Oral Daily   pantoprazole  40 mg Oral Daily   potassium chloride  40 mEq Oral Daily   pravastatin  80 mg Oral q1800   pregabalin  150 mg Oral QHS   pregabalin  75 mg Oral Daily   sildenafil  60 mg Oral TID   sodium chloride flush  3 mL Intravenous Q12H    have reviewed scheduled and prn medications.  Physical Exam: General:NAD, comfortable Heart:RRR, s1s2 nl, no rubs Lungs: Clear b/l, no crackle Abdomen:soft, Non-tender, non-distended Extremities:No LE edema Neurology: Alert, awake, no asterixis  Treesa Mccully Prasad Anvi Mangal 10/22/2020,10:28 AM  LOS: 8 days

## 2020-10-22 NOTE — Progress Notes (Signed)
OT Cancellation Note  Patient Details Name: Grace Bradford MRN: [id number] DOB: June 28, 1945   Cancelled Treatment:    Reason Eval/Treat Not Completed: Fatigue/lethargy limiting ability to participate;Other (comment) Pt asleep, declining OT session at this time, will check back as time allows for OT session.  Corinne Ports K., COTA/L Acute Rehabilitation Services [phone]   Precious Haws 10/22/2020, 3:32 PM

## 2020-10-22 NOTE — Progress Notes (Addendum)
Advanced Heart Failure Rounding Note  PCP-Cardiologist: None   Subjective:   Admitted with AKI and volume overload. Started on IV lasix. RHC yesterday demonstrated moderately to severely elevated biventricular filling pressures (R>L) and severe mixed pulmonary HTN.  Echo EF <20%. RV moderately reduced. Severe TR. In comparison to prior study, EF is down from 40-45%.   Feels better, breathing improved. Wt down 1 lb. SCr stable at 2.4. Switched back to PO diuretics today, Lasix 80 bid per nephrology.   Had 29 beat run of VT yesterday afternoon but asymptomatic. Mg was 1.7. Supp given. Improved today at 2.3. K 4.2. No further recurrence.   RHC  10/15/20 RA = 15 RV = 78/24 PA = 86/33 (52) PCW = 22 Fick cardiac output/index = 5.8/3.00 PVR = 5.0 Ao sat = 99% PA sat = 62%, 63%  Assessment: 1. Moderately to severely elevated biventricular filling pressures (R>L) 2. Severe mixed pulmonary HTN    Objective:   Weight Range: 93.2 kg Body mass index is 38.81 kg/m.   Vital Signs:   Temp:  [98.2 F (36.8 C)-98.7 F (37.1 C)] 98.4 F (36.9 C) (06/29 0749) Pulse Rate:  [30-157] 74 (06/29 0352) Resp:  [12-40] 20 (06/29 0749) BP: (123-146)/(59-88) 142/66 (06/29 0352) SpO2:  [75 %-100 %] 97 % (06/29 0352) Weight:  [93.2 kg] 93.2 kg (06/29 0226) Last BM Date: 10/19/20  Weight change: Filed Weights   10/20/20 0905 10/21/20 0140 10/22/20 0226  Weight: 93.7 kg 93.7 kg 93.2 kg    Intake/Output:   Intake/Output Summary (Last 24 hours) at 10/22/2020 1207 Last data filed at 10/22/2020 0559 Gross per 24 hour  Intake 600 ml  Output 1578 ml  Net -978 ml      Physical Exam    PHYSICAL EXAM: General:  Well appearing. Sitting up in chair No respiratory difficulty HEENT: normal Neck: supple. JVD to jaw. Carotids 2+ bilat; no bruits. No lymphadenopathy or thyromegaly appreciated. Cor: PMI nondisplaced. Regular rate & rhythm. No rubs, gallops or murmurs. Lungs: clear Abdomen:  soft, nontender, nondistended. No hepatosplenomegaly. No bruits or masses. Good bowel sounds. Extremities: no cyanosis, clubbing, rash, trace bilateral LEE edema, + Unna boots  Neuro: alert & oriented x 3, cranial nerves grossly intact. moves all 4 extremities w/o difficulty. Affect pleasant.   Telemetry   NSR 70s 29 beat run of VT on 6/28. No recurence    Labs    CBC No results for input(s): WBC, NEUTROABS, HGB, HCT, MCV, PLT in the last 72 hours.  Basic Metabolic Panel Recent Labs    10/21/20 0328 10/21/20 1948 10/22/20 0333  NA 137 136 136  K 3.8 4.2 3.5  CL 98 98 98  CO2 29 26 28   GLUCOSE 156* 173* 195*  BUN 67* 68* 72*  CREATININE 2.36* 2.40* 2.36*  CALCIUM 8.5* 8.7* 8.6*  MG 1.7 2.4 2.3  PHOS 3.4  --  3.5   Liver Function Tests Recent Labs    10/21/20 0328 10/22/20 0333  ALBUMIN 2.7* 2.8*   No results for input(s): LIPASE, AMYLASE in the last 72 hours. Cardiac Enzymes No results for input(s): CKTOTAL, CKMB, CKMBINDEX, TROPONINI in the last 72 hours.  BNP: BNP (last 3 results) Recent Labs    09/03/20 1511 09/25/20 0842 10/14/20 1252  BNP 1,286.7* 1,752.8* 1,972.0*    ProBNP (last 3 results) No results for input(s): PROBNP in the last 8760 hours.   D-Dimer No results for input(s): DDIMER in the last 72 hours. Hemoglobin A1C  No results for input(s): HGBA1C in the last 72 hours. Fasting Lipid Panel No results for input(s): CHOL, HDL, LDLCALC, TRIG, CHOLHDL, LDLDIRECT in the last 72 hours. Thyroid Function Tests No results for input(s): TSH, T4TOTAL, T3FREE, THYROIDAB in the last 72 hours.  Invalid input(s): FREET3   Other results:   Imaging    No results found.   Medications:     Scheduled Medications:  allopurinol  50 mg Oral Daily   aspirin EC  81 mg Oral Daily   busPIRone  7.5 mg Oral BID   carvedilol  3.125 mg Oral BID WC   clopidogrel  75 mg Oral Daily   diclofenac Sodium  2 g Topical QID   furosemide  80 mg Oral BID    heparin  5,000 Units Subcutaneous Q8H   hydrOXYzine  25 mg Oral QID   insulin aspart  0-5 Units Subcutaneous QHS   insulin aspart  0-9 Units Subcutaneous TID WC   ivabradine  7.5 mg Oral BID WC   levothyroxine  100 mcg Oral Once per day on Mon Tue Wed Thu Fri   multivitamin with minerals  1 tablet Oral Daily   pantoprazole  40 mg Oral Daily   potassium chloride  40 mEq Oral Daily   pravastatin  80 mg Oral q1800   pregabalin  150 mg Oral QHS   pregabalin  75 mg Oral Daily   sildenafil  60 mg Oral TID   sodium chloride flush  3 mL Intravenous Q12H    Infusions:  sodium chloride 250 mL (10/18/20 1247)    PRN Medications: sodium chloride, acetaminophen, albuterol, fluticasone, sodium chloride flush    Patient Profile   Grace Bradford is a 75 y/o woman with obesity, DM2, HTN, HL, CRI and HF with 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 Echo 3/18 EF 45-50%.  Admitted with AKI and volume overload.   Assessment/Plan  1. 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  --> Echo 06/2016, EF 45-50% - Echo 4/19 EF 40-45% - Echo 3/21 EF 35-40% moderate RV dysfunction - Diuretics had been cut back since 2/22 after admit for dehydration and AKI. SCr peaked to 4.5 - Echo 6/22 w/ EF < 20%, RV moderately reduced. Severe TR  - RHC w/ moderately to severely elevated biventricular filling pressures (R>L). RA 15. PCW 22. CO/CI normal - Nephrology evaluated. No indication for HD but may be headed that way.  - Lasix drip stopped. Volume status look better.  - Switch back to PO diuretics today, Lasix 80 mg bid.  - Per nephrology, she should stay off of Farxiga given low GFR - Continue Coreg to 3.125 mg bid  - Continue Sildenafil (for RV dysjunction). - Off Spiro & dig w/ renal dysfunction. - Continue fluid restriction  2. 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 Hollie Salk - SCr baseline ~2.6 - Renal US showed mildly increased renal cortical cortical echogenicity  but otherwise unremarkable. - Myeloma w/u negative    - Admit creatinine 3.9--> 3.35->2.8 ->2.6 ->2.4 -> 2.3 -> 2.1->2.6->2.4->2.4 - Follow BMET daily . - concerned that she may be getting close to time to prepare for HD with vein mapping   3. H/o Hypercalcemia  - Calcitrol and calcium replacement has been discontinued. - Bone survey w/ no evidence of lytic or sclerotic lesions. Myeloma w/u negative - Ca 8.6 today     4. Type 2 DM  - Controlled. Hgb A1c 6.1.  -  Stop Farxiga. - SSI  5. PVCs/Ventricular Bigeminy/ VT  - 29 beat run of VT 6/28 in setting of low mag, 1.7  - resolved after correction of hypokalemia/hypomagnesemia   - Keep K > 4.0 and Mg > 2.0   6. Severe TR - likely functional, RV moderately enlarged, moderately reduced systolic function   7. Hypomagnesemia - resolve, Mg 2.3 today.   Monitor response to PO Lasix, possible d/c home tomorrow.   Length of Stay: [address], [place]  10/22/2020, 12:07 PM  Advanced Heart Failure Team Pager [phone] (M-F; 7a - 5p)  Please contact [facility] for night-coverage after hours (5p -7a ) and weekends on [email or web address]  Patient seen and examined with the above-signed Advanced Practice Provider and/or Housestaff. I personally reviewed laboratory data, imaging studies and relevant notes. I independently examined the patient and formulated the important aspects of the plan. I have edited the note to reflect any of my changes or salient points. I have personally discussed the plan with the patient and/or family.   Remains volume overloaded. Weight unchanged. Renal has switch to oral lasix. Denies SOB, orthopnea or PND.   General: Lying in bed  No resp difficulty HEENT: normal Neck: supple. JVP to jaw. Carotids 2+ bilat; no bruits. No lymphadenopathy or thryomegaly appreciated. Cor: PMI nondisplaced. Regular rate & rhythm. 2/6 TR Lungs: clear Abdomen: obese soft, nontender, nondistended. No hepatosplenomegaly. No bruits or  masses. Good bowel sounds. Extremities: no cyanosis, clubbing, rash, 2+ edema Neuro: alert & orientedx3, cranial nerves grossly intact. moves all 4 extremities w/o difficulty. Affect pleasant  She remains volume overloaded although not overly symptomatic. Creatinine stable. She is not good candidate for HD. I will give a dose of metolazone today to see if this helps. We have begun discussing [place].   Glori Bickers, MD  5:44 PM

## 2020-10-22 NOTE — Progress Notes (Signed)
Physical Therapy Treatment Patient Details Name: Grace Bradford MRN: [id number] DOB: February 25, 1946 Today's Date: 10/22/2020    History of Present Illness Pt is a 75 y.o. female admitted 10/14/20 with SOB. Workup for acute on chronic HF exacerbation, AKI. S/p RHC 6/22. PMH includes CHF, CVA, HTN, gout, MI, neuropathy, OSA, DM2, back pain.   PT Comments    Pt progressing with mobility. Today's session focused on transfer and gait training with rollator, as well as LE strengthening. Pt demonstrates improving stability and activity tolerance, still requiring seated rest to recover between short activity bouts. Pt motivated to participate despite c/o fatigue and BLE soreness. Pt hopeful for d/c home soon.  SpO2 96-100% on RA, HR 80s    Follow Up Recommendations  Home health PT;Supervision for mobility/OOB     Equipment Recommendations  Other (comment) (bariatric-sized rollator)    Recommendations for Other Services       Precautions / Restrictions Precautions Precautions: Fall Restrictions Weight Bearing Restrictions: No    Mobility  Bed Mobility               General bed mobility comments: Received sitting in recliner    Transfers Overall transfer level: Needs assistance Equipment used: 4-wheeled walker Transfers: Sit to/from Stand Sit to Stand: Supervision         General transfer comment: multiple sit<>stands from recliner to rollator with supervision for safety; initial cues to lock rollator brakes, pt with good carryover to subsequent trials; cues for improved eccentric control into sittin  Ambulation/Gait Ambulation/Gait assistance: Supervision Gait Distance (Feet): 44 Feet (+44) Assistive device: 4-wheeled walker Gait Pattern/deviations: Step-through pattern;Decreased stride length;Trunk flexed Gait velocity: Decreased   General Gait Details: Slow, mostly steady gait with rollator and supervision for safety; stability improving with less antalgic gait  pattern; prolonged seated rest break between gait trials secondary to fatigue and c/o bilateral hip pain   Stairs             Wheelchair Mobility    Modified Rankin (Stroke Patients Only)       Balance Overall balance assessment: Needs assistance Sitting-balance support: No upper extremity supported;Feet supported Sitting balance-Leahy Scale: Good     Standing balance support: Bilateral upper extremity supported;No upper extremity supported Standing balance-Leahy Scale: Fair Standing balance comment: can static stand without UE support; static and dynamic stability improved with UE support                            Cognition Arousal/Alertness: Awake/alert Behavior During Therapy: WFL for tasks assessed/performed Overall Cognitive Status: Within Functional Limits for tasks assessed                                        Exercises General Exercises - Lower Extremity Ankle Circles/Pumps: AROM;Both;Seated Long Arc Quad: AROM;Both;Seated Hip Flexion/Marching: AROM;Both;Seated    General Comments General comments (skin integrity, edema, etc.): SpO2 96-100% on RA, HR 80s      Pertinent Vitals/Pain Pain Assessment: Faces Faces Pain Scale: Hurts a little bit Pain Location: Bilateral hips Pain Descriptors / Indicators: Sore Pain Intervention(s): Monitored during session    Home Living                      Prior Function            PT Goals (current goals can now  be found in the care plan section) Progress towards PT goals: Progressing toward goals    Frequency    Min 3X/week      PT Plan Equipment recommendations need to be updated    Co-evaluation              AM-PAC PT "6 Clicks" Mobility   Outcome Measure  Help needed turning from your back to your side while in a flat bed without using bedrails?: None Help needed moving from lying on your back to sitting on the side of a flat bed without using  bedrails?: None Help needed moving to and from a bed to a chair (including a wheelchair)?: A Little Help needed standing up from a chair using your arms (e.g., wheelchair or bedside chair)?: A Little Help needed to walk in hospital room?: A Little Help needed climbing 3-5 steps with a railing? : A Little 6 Click Score: 20    End of Session   Activity Tolerance: Patient tolerated treatment well Patient left: in chair;with call bell/phone within reach;with chair alarm set Nurse Communication: Mobility status PT Visit Diagnosis: Muscle weakness (generalized) (M62.81);Other abnormalities of gait and mobility (R26.89)     Time: 6945-0388 PT Time Calculation (min) (ACUTE ONLY): 26 min  Charges:  $Therapeutic Exercise: 8-22 mins $Therapeutic Activity: 8-22 mins           Mabeline Caras, PT, DPT Acute Rehabilitation Services  Pager [phone] Office [place] 10/22/2020, 12:46 PM

## 2020-10-22 NOTE — Progress Notes (Signed)
Patient requested valium for muscle spasm. Has periodically received this here in the hospital. Will rx 2mg  x 1 dose. CHF team can re-evaluate in AM whether this should be a standing order.

## 2020-10-23 DIAGNOSIS — N184 Chronic kidney disease, stage 4 (severe): Secondary | ICD-10-CM | POA: Diagnosis not present

## 2020-10-23 DIAGNOSIS — Z515 Encounter for palliative care: Secondary | ICD-10-CM

## 2020-10-23 DIAGNOSIS — I130 Hypertensive heart and chronic kidney disease with heart failure and stage 1 through stage 4 chronic kidney disease, or unspecified chronic kidney disease: Secondary | ICD-10-CM | POA: Diagnosis not present

## 2020-10-23 DIAGNOSIS — E1122 Type 2 diabetes mellitus with diabetic chronic kidney disease: Secondary | ICD-10-CM | POA: Diagnosis not present

## 2020-10-23 DIAGNOSIS — I5042 Chronic combined systolic (congestive) and diastolic (congestive) heart failure: Secondary | ICD-10-CM | POA: Diagnosis not present

## 2020-10-23 DIAGNOSIS — Z7189 Other specified counseling: Secondary | ICD-10-CM

## 2020-10-23 LAB — RENAL FUNCTION PANEL
Albumin: 2.9 g/dL — ABNORMAL LOW (ref 3.5–5.0)
Anion gap: 11 (ref 5–15)
BUN: 77 mg/dL — ABNORMAL HIGH (ref 8–23)
CO2: 26 mmol/L (ref 22–32)
Calcium: 8.9 mg/dL (ref 8.9–10.3)
Chloride: 96 mmol/L — ABNORMAL LOW (ref 98–111)
Creatinine, Ser: 2.57 mg/dL — ABNORMAL HIGH (ref 0.44–1.00)
GFR, Estimated: 19 mL/min — ABNORMAL LOW (ref >60)
Glucose, Bld: 168 mg/dL — ABNORMAL HIGH (ref 70–99)
Phosphorus: 3.5 mg/dL (ref 2.5–4.6)
Potassium: 4.2 mmol/L (ref 3.5–5.1)
Sodium: 133 mmol/L — ABNORMAL LOW (ref 135–145)

## 2020-10-23 LAB — GLUCOSE, CAPILLARY
Glucose-Capillary: 175 mg/dL — ABNORMAL HIGH (ref 70–99)
Glucose-Capillary: 198 mg/dL — ABNORMAL HIGH (ref 70–99)
Glucose-Capillary: 192 mg/dL — ABNORMAL HIGH (ref 70–99)
Glucose-Capillary: 181 mg/dL — ABNORMAL HIGH (ref 70–99)

## 2020-10-23 LAB — MAGNESIUM: Magnesium: 2.2 mg/dL (ref 1.7–2.4)

## 2020-10-23 MED ORDER — FUROSEMIDE 10 MG/ML IJ SOLN
80.0000 mg | Freq: Once | INTRAMUSCULAR | Status: AC
  Administered 2020-10-23: 80 mg via INTRAVENOUS
  Filled 2020-10-23: qty 8
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
## 2020-10-23 NOTE — Progress Notes (Signed)
[facility] NEPHROLOGY PROGRESS NOTE  Assessment/ Plan: Pt is a 75 y.o. yo female  with history of hypertension, HLD, DM, OSA, stroke, CAD, CHF who was admitted with CHF exacerbation on 6/22 treated with IV diuretics, seen as a consultation for the management of CKD stage IV and fluid management.  She follows with Dr. Hollie Salk at [facility].  #AKI on CKD stage IV, nonoliguric: It seems like the baseline creatinine level anywhere between 2-3.  UA bland.  Creatinine level 3.87 on admission which was improved initially with diuretics.  She has worsening heart failure EF <20 % with increased filling pressure.   She has been diuresing with higher dose of IV and oral diuretics.  Still looks volume overload and not much improvement.  Discussed with Dr. Haroldine Laws about end-stage heart disease and challenges to manage volume outpatient with both IV and oral diuretics.  She is now showing early sign of uremia.  With her poor cardiac function the dialysis will be very challenging for her and will not increase the quality of life.  She most likely will not tolerate it.  I have discussed this with the patient and agreed to have palliative care consult to discuss overall goals of care.  I have called and discussed with her outpatient nephrologist Dr. Hollie Salk as well.  We will follow her along closely.   #Acute on chronic heart failure both right and left heart.  Recent EF with EF < 20%, severe TR and moderately reduced RV function.  Not much response with diuretics.  Management as above.  Discussed with Dr. Haroldine Laws.  Continue to hold Farxiga and Aldactone.   #Hypertension/volume: Continue current cardiac medication including carvedilol.  Diuretics as above.  BP acceptable.   #Anemia of CKD: Hemoglobin at goal.  Monitor lab.   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   #Hyponatremia, hypervolemic: I will order a dose of IV Lasix.  On oral torsemide.  Subjective: Seen and examined at bedside.  The urine  output is around 1.5 L.  She feels fatigue tired and has been requiring oxygen.  Still has some lower extremity edema.  Feels more sleepy.  Denies chest pain or shortness of breath.  Discussed about her worsening kidney function etc.  Her nurse at bedside.  Objective Vital signs in last 24 hours: Vitals:   10/23/20 0012 10/23/20 0247 10/23/20 0413 10/23/20 0830  BP: (!) 131/52  (!) 123/58   Pulse: 70  71   Resp: 18  20   Temp: 98.5 F (36.9 C)  98.3 F (36.8 C) 97.9 F (36.6 C)  TempSrc: Oral  Oral Oral  SpO2: 100%  98%   Weight:  93.2 kg    Height:       Weight change: 0 kg  Intake/Output Summary (Last 24 hours) at 10/23/2020 1039 Last data filed at 10/23/2020 1000 Gross per 24 hour  Intake 940 ml  Output 1500 ml  Net -560 ml        Labs: Basic Metabolic Panel: Recent Labs  Lab 10/21/20 0328 10/21/20 1948 10/22/20 0333 10/23/20 0203  NA 137 136 136 133*  K 3.8 4.2 3.5 4.2  CL 98 98 98 96*  CO2 29 26 28 26   GLUCOSE 156* 173* 195* 168*  BUN 67* 68* 72* 77*  CREATININE 2.36* 2.40* 2.36* 2.57*  CALCIUM 8.5* 8.7* 8.6* 8.9  PHOS 3.4  --  3.5 3.5    Liver Function Tests: Recent Labs  Lab 10/21/20 0328 10/22/20 0333 10/23/20 1093  ALBUMIN 2.7* 2.8* 2.9*    No results for input(s): LIPASE, AMYLASE in the last 168 hours. No results for input(s): AMMONIA in the last 168 hours. CBC: No results for input(s): WBC, NEUTROABS, HGB, HCT, MCV, PLT in the last 168 hours.  Cardiac Enzymes: No results for input(s): CKTOTAL, CKMB, CKMBINDEX, TROPONINI in the last 168 hours. CBG: Recent Labs  Lab 10/22/20 0602 10/22/20 1159 10/22/20 1544 10/22/20 2102 10/23/20 0632  GLUCAP 198* 216* 213* 158* 175*     Iron Studies: No results for input(s): IRON, TIBC, TRANSFERRIN, FERRITIN in the last 72 hours. Studies/Results: No results found.  Medications: Infusions:  sodium chloride 250 mL (10/18/20 1247)    Scheduled Medications:  allopurinol  50 mg Oral Daily    aspirin EC  81 mg Oral Daily   busPIRone  7.5 mg Oral BID   carvedilol  3.125 mg Oral BID WC   clopidogrel  75 mg Oral Daily   diclofenac Sodium  2 g Topical QID   heparin  5,000 Units Subcutaneous Q8H   hydrOXYzine  25 mg Oral QID   insulin aspart  0-5 Units Subcutaneous QHS   insulin aspart  0-9 Units Subcutaneous TID WC   ivabradine  7.5 mg Oral BID WC   levothyroxine  100 mcg Oral Once per day on Mon Tue Wed Thu Fri   multivitamin with minerals  1 tablet Oral Daily   pantoprazole  40 mg Oral Daily   potassium chloride  40 mEq Oral Daily   pravastatin  80 mg Oral q1800   pregabalin  150 mg Oral QHS   pregabalin  75 mg Oral Daily   sildenafil  60 mg Oral TID   sodium chloride flush  3 mL Intravenous Q12H   torsemide  60 mg Oral BID    have reviewed scheduled and prn medications.  Physical Exam: General: Ill-looking female, sitting on chair, looks tired Heart:RRR, systolic murmur, no rubs Lungs: Basal crackle Abdomen:soft, Non-tender, non-distended Extremities: LE edema present Neurology: Alert, awake, no asterixis  Brittnei Jagiello Prasad Tiffony Kite 10/23/2020,10:39 AM  LOS: 9 days

## 2020-10-23 NOTE — Progress Notes (Signed)
OT Cancellation Note  Patient Details Name: Grace Bradford MRN: [id number] DOB: 08/09/1945   Cancelled Treatment:    Reason Eval/Treat Not Completed: Fatigue/lethargy limiting ability to participate;Other (comment) Pt received up in recliner reporting fatigue and declining functional mobility or ADLs. Will continue efforts as time allows.   Corinne Ports K., COTA/L Acute Rehabilitation Services [phone]   Precious Haws 10/23/2020, 9:02 AM

## 2020-10-23 NOTE — Progress Notes (Signed)
Dr. Carolin Sicks at bedside, spoke regarding worsening kidney function and being unable to tolerate dialysis.  Spoke with patient about speaking with Palliative Care and patient is in agreement.

## 2020-10-23 NOTE — Progress Notes (Addendum)
Advanced Heart Failure Rounding Note  PCP-Cardiologist: None   Subjective:   Admitted with AKI and volume overload. Started on IV lasix. RHC yesterday demonstrated moderately to severely elevated biventricular filling pressures (R>L) and severe mixed pulmonary HTN.  Echo EF <20%. RV moderately reduced. Severe TR. In comparison to prior study, EF is down from 40-45%.    RHC  10/15/20 RA = 15 RV = 78/24 PA = 86/33 (52) PCW = 22 Fick cardiac output/index = 5.8/3.00 PVR = 5.0 Ao sat = 99% PA sat = 62%, 63%  Assessment: 1. Moderately to severely elevated biventricular filling pressures (R>L) 2. Severe mixed pulmonary HTN  Switched back to PO Lasix yesterday + got 2.5 of metolazone. UOP not impressive, only 1.5L yesterday. Wt unchanged 205 lb. SCr bump 2.4>>2.6.   She dozed off multiple times during exam but easily arousable .She attributes this to poor sleep last PM but ? Early uremic symptoms, BUN trending up at 77 today. K stable at 4.2. Na low at 133. Nephrology has seen and ordered 80 mg IV Lasix     Objective:   Weight Range: 93.2 kg Body mass index is 38.81 kg/m.   Vital Signs:   Temp:  [97.9 F (36.6 C)-98.5 F (36.9 C)] 97.9 F (36.6 C) (06/30 0830) Pulse Rate:  [64-71] 71 (06/30 0413) Resp:  [18-20] 20 (06/30 0413) BP: (115-131)/(52-73) 123/58 (06/30 0413) SpO2:  [98 %-100 %] 98 % (06/30 0413) Weight:  [93.2 kg] 93.2 kg (06/30 0247) Last BM Date: 10/22/20  Weight change: Filed Weights   10/21/20 0140 10/22/20 0226 10/23/20 0247  Weight: 93.7 kg 93.2 kg 93.2 kg    Intake/Output:   Intake/Output Summary (Last 24 hours) at 10/23/2020 1119 Last data filed at 10/23/2020 1000 Gross per 24 hour  Intake 940 ml  Output 1500 ml  Net -560 ml      Physical Exam    General:  Elderly AAF, fatigued appearing, sitting up in chair. No respiratory difficulty HEENT: normal Neck: supple. JVD elevated to ear. Carotids 2+ bilat; no bruits. No lymphadenopathy or  thyromegaly appreciated. Cor: PMI nondisplaced. Regular rate & rhythm. No rubs, gallops or murmurs. Lungs: decreased BS at the bases bilaterally  Abdomen: obese, soft, nontender, nondistended. No hepatosplenomegaly. No bruits or masses. Good bowel sounds. Extremities: no cyanosis, clubbing, rash, 1+ bilateral LE edema + bilateral unna boots  Neuro: drowsy, oriented x 3, cranial nerves grossly intact. moves all 4 extremities w/o difficulty. Affect pleasant.  Telemetry   NSR 80s, 6 beat run NSVT    Labs    CBC No results for input(s): WBC, NEUTROABS, HGB, HCT, MCV, PLT in the last 72 hours.  Basic Metabolic Panel Recent Labs    10/22/20 0333 10/23/20 0203  NA 136 133*  K 3.5 4.2  CL 98 96*  CO2 28 26  GLUCOSE 195* 168*  BUN 72* 77*  CREATININE 2.36* 2.57*  CALCIUM 8.6* 8.9  MG 2.3 2.2  PHOS 3.5 3.5   Liver Function Tests Recent Labs    10/22/20 0333 10/23/20 0203  ALBUMIN 2.8* 2.9*   No results for input(s): LIPASE, AMYLASE in the last 72 hours. Cardiac Enzymes No results for input(s): CKTOTAL, CKMB, CKMBINDEX, TROPONINI in the last 72 hours.  BNP: BNP (last 3 results) Recent Labs    09/03/20 1511 09/25/20 0842 10/14/20 1252  BNP 1,286.7* 1,752.8* 1,972.0*    ProBNP (last 3 results) No results for input(s): PROBNP in the last 8760 hours.   D-Dimer  No results for input(s): DDIMER in the last 72 hours. Hemoglobin A1C No results for input(s): HGBA1C in the last 72 hours. Fasting Lipid Panel No results for input(s): CHOL, HDL, LDLCALC, TRIG, CHOLHDL, LDLDIRECT in the last 72 hours. Thyroid Function Tests No results for input(s): TSH, T4TOTAL, T3FREE, THYROIDAB in the last 72 hours.  Invalid input(s): FREET3   Other results:   Imaging    No results found.   Medications:     Scheduled Medications:  allopurinol  50 mg Oral Daily   aspirin EC  81 mg Oral Daily   busPIRone  7.5 mg Oral BID   carvedilol  3.125 mg Oral BID WC   clopidogrel  75  mg Oral Daily   diclofenac Sodium  2 g Topical QID   furosemide  80 mg Intravenous Once   heparin  5,000 Units Subcutaneous Q8H   hydrOXYzine  25 mg Oral QID   insulin aspart  0-5 Units Subcutaneous QHS   insulin aspart  0-9 Units Subcutaneous TID WC   ivabradine  7.5 mg Oral BID WC   levothyroxine  100 mcg Oral Once per day on Mon Tue Wed Thu Fri   multivitamin with minerals  1 tablet Oral Daily   pantoprazole  40 mg Oral Daily   potassium chloride  40 mEq Oral Daily   pravastatin  80 mg Oral q1800   pregabalin  150 mg Oral QHS   pregabalin  75 mg Oral Daily   sildenafil  60 mg Oral TID   sodium chloride flush  3 mL Intravenous Q12H   torsemide  60 mg Oral BID    Infusions:  sodium chloride 250 mL (10/18/20 1247)    PRN Medications: sodium chloride, acetaminophen, albuterol, fluticasone, sodium chloride flush    Patient Profile   Grace Bradford is a 75 y/o woman with obesity, DM2, HTN, HL, CRI and HF with 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 Echo 3/18 EF 45-50%.  Admitted with AKI and volume overload.   Assessment/Plan  1. 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  --> Echo 06/2016, EF 45-50% - Echo 4/19 EF 40-45% - Echo 3/21 EF 35-40% moderate RV dysfunction - Diuretics had been cut back since 2/22 after admit for dehydration and AKI. SCr peaked to 4.5 - Echo 6/22 w/ EF < 20%, RV moderately reduced. Severe TR  - RHC w/ moderately to severely elevated biventricular filling pressures (R>L). RA 15. PCW 22. CO/CI normal - some diuresis w/ IV Lasix but failing PO again. Fluid overloaded - Nephrology following, plan to restart IV Lasix again today. Suspect we are nearing need for HD, but would be poor candidate given RV dysfunction  - Per nephrology, she should stay off of Farxiga given low GFR - Continue Coreg to 3.125 mg bid  - Continue Sildenafil (for RV dysjunction). - Off Spiro & dig w/ renal dysfunction. - Continue fluid  restriction  2. 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 Hollie Salk - SCr baseline ~2.6 - Renal US showed mildly increased renal cortical cortical echogenicity but otherwise unremarkable. - Myeloma w/u negative    - Admit creatinine 3.9--> 3.35->2.8 ->2.6 ->2.4 -> 2.3 -> 2.1->2.6->2.4->2.4->2.6 - Follow BMET daily . - Suspect we are nearing need for HD, but would be poor candidate given RV dysfunction   3. H/o Hypercalcemia  - Calcitrol and calcium replacement has been discontinued. - Bone survey w/ no evidence of lytic or sclerotic lesions.  Myeloma w/u negative - Ca 8.9 today     4. Type 2 DM  - Controlled. Hgb A1c 6.1.  - Stop Farxiga. - SSI  5. PVCs/Ventricular Bigeminy/ VT  - 29 beat run of VT 6/28 in setting of low mag, 1.7  - resolved after correction of hypokalemia/hypomagnesemia   - Keep K > 4.0 and Mg > 2.0   6. Severe TR - likely functional, RV moderately enlarged, moderately reduced systolic function   7. Hypomagnesemia - resolve, Mg 2.2 today.   8. Hypervolemic Hyponatremia - Na 133 - Diuresis per above - Fluid restrict   Responding poorly to diuretics. Not a good candidate for HD. Nephrology to engage palliative care today to start [place] discussion.  Length of Stay: [address] PA-C  10/23/2020, 11:19 AM  Advanced Heart Failure Team Pager [phone] (M-F; 7a - 5p)  Please contact [facility] for night-coverage after hours (5p -7a ) and weekends on [email or web address]  Patient seen and examined with the above-signed Advanced Practice Provider and/or Housestaff. I personally reviewed laboratory data, imaging studies and relevant notes. I independently examined the patient and formulated the important aspects of the plan. I have edited the note to reflect any of my changes or salient points. I have personally discussed the plan with the patient and/or family.  Denies SOB, orthopnea or PND. Volume status getting worse despite oral torsemide and metolazone.  ReDS 43%. Weight unchanged. ScR 2.36 -> 2.57.  General:  Lying flat in bed  No resp difficulty HEENT: normal Neck: supple. JVP to jaw Carotids 2+ bilat; no bruits. No lymphadenopathy or thryomegaly appreciated. Cor: PMI nondisplaced. Regular rate & rhythm. 2/6 TR Lungs: clear Abdomen: obese soft, nontender, nondistended. No hepatosplenomegaly. No bruits or masses. Good bowel sounds. Extremities: no cyanosis, clubbing, rash, 2+ edema + UNNA boots Neuro: alert & orientedx3, cranial nerves grossly intact. moves all 4 extremities w/o difficulty. Affect pleasant  She has end-stage HF and cardiorenal syndrome. We have been unable to maintain euvolemia despite frequent IV diuresis at home and aggressive care here. I d/w Dr. Carolin Sicks who discussed possibility of HD with her and explained she may not tolerate well with severe HF. She has elected not to proceed with HD attempt. Palliative Care consulted for goals of care.   Glori Bickers, MD  6:09 PM

## 2020-10-24 LAB — RENAL FUNCTION PANEL
Albumin: 2.8 g/dL — ABNORMAL LOW (ref 3.5–5.0)
Anion gap: 10 (ref 5–15)
BUN: 87 mg/dL — ABNORMAL HIGH (ref 8–23)
CO2: 28 mmol/L (ref 22–32)
Calcium: 8.7 mg/dL — ABNORMAL LOW (ref 8.9–10.3)
Chloride: 98 mmol/L (ref 98–111)
Creatinine, Ser: 2.76 mg/dL — ABNORMAL HIGH (ref 0.44–1.00)
GFR, Estimated: 18 mL/min — ABNORMAL LOW (ref >60)
Glucose, Bld: 206 mg/dL — ABNORMAL HIGH (ref 70–99)
Phosphorus: 4 mg/dL (ref 2.5–4.6)
Potassium: 4 mmol/L (ref 3.5–5.1)
Sodium: 136 mmol/L (ref 135–145)

## 2020-10-24 LAB — GLUCOSE, CAPILLARY
Glucose-Capillary: 203 mg/dL — ABNORMAL HIGH (ref 70–99)
Glucose-Capillary: 189 mg/dL — ABNORMAL HIGH (ref 70–99)
Glucose-Capillary: 259 mg/dL — ABNORMAL HIGH (ref 70–99)
Glucose-Capillary: 168 mg/dL — ABNORMAL HIGH (ref 70–99)

## 2020-10-24 MED ORDER — TORSEMIDE 100 MG PO TABS
100.0000 mg | ORAL_TABLET | Freq: Two times a day (BID) | ORAL | Status: DC
  Administered 2020-10-24 – 2020-10-25 (×2): 100 mg via ORAL
  Filled 2020-10-24 (×2): qty 1

## 2020-10-24 NOTE — TOC Transition Note (Signed)
Transition of Care [facility]) - CM/SW Discharge Note   Patient Details  Name: Grace Bradford MRN: [id number] Date of Birth: May 03, 1945  Transition of Care [facility]) CM/SW Contact:  Zenon Mayo, RN Phone Number: 10/24/2020, 3:15 PM   Clinical Narrative:    Patient is for dc to home tomorrow with [facility] hospice.  NCM confirmed with offer of choice that patient wants [facility].  She states spouse Mr. Lacosse will be the contact person.  They will need a hospital bed , 3 n 1,  and oxygen.  Patient states she would like to be transported by car.   Final next level of care: Home w Hospice Care Barriers to Discharge: Continued Medical Work up   Patient Goals and CMS Choice Patient states their goals for this hospitalization and ongoing recovery are:: home with hospice CMS Medicare.gov Compare Post Acute Care list provided to:: Patient Choice offered to / list presented to : Patient  Discharge Placement                       Discharge Plan and Services   Discharge Planning Services: CM Consult Post Acute Care Choice: NA          DME Arranged: Walker rolling with seat ([facility] will supply the DME) DME Agency: [employer] Date DME Agency Contacted: 10/16/20 Time DME Agency Contacted: 23 Representative spoke with at DME Agency: Adela Lank HH Arranged: RN [place] Agency:  ([place]) Date [place]: 10/24/20 Time [place]: 1017 Representative spoke with at [place]: [place] ([place]) Interventions     Readmission Risk Interventions Readmission Risk Prevention Plan 10/16/2020  Transportation Screening Complete  Medication Review Press photographer) Complete  PCP or Specialist appointment within 3-5 days of discharge Complete  HRI or [place] Complete  SW Recovery Care/Counseling Consult Complete  [place] Not Applicable  Some recent  data might be hidden

## 2020-10-24 NOTE — Progress Notes (Addendum)
Manufacturing engineer [facility]) Hospital Liaison: RN note     Notified by Transition of Care Manger of patient/family request for [facility] services at home after discharge. Chart and patient information under review by [facility] physician. Hospice eligibility pending currently.     Writer left a Advertising account executive for pt's spouse, Mitzi Hansen.  [place] liaisons will continue to reach out to pt and family.  Please send signed and completed DNR form home with patient/family.   Please provide prescriptions for mediations at discharge to ensure comfort until patient can be admitted onto hospice services.   DME needs have been discussed with TOC and with PMT NP, Gregary Signs.  Patient currently has the following equipment:  bariatric rolling walker through [employer].  Patient/family requests the following DME for delivery to the home:    bariatric hospital bed, Bariatric BSC, and O2.  [place] equipment manager has been notified and will contact DME provider to arrange delivery to the home. Home address has been verified and is correct in the chart.     Please do not hesitate to call with questions.   Thank you for the opportunity to participate in this patient's care.  Domenic Moras, BSN, RN       [place] (listed on AMION under [phone]  [phone] (24h on call)

## 2020-10-24 NOTE — Progress Notes (Signed)
Daily Progress Note   Patient Name: Grace Bradford       Date: 10/24/2020 DOB: 1945/05/02  Age: 75 y.o. MRN#: [id number] Attending Physician: Jolaine Artist, MD Primary Care Physician: Jani Gravel, MD Admit Date: 10/14/2020  Reason for Consultation/Follow-up: Establishing goals of care "worsened kidney function with end stage heart disease, poor candidate for dialysis"  Subjective: Spoke with Dr. Haroldine Laws - he had a long conversation with Mrs. Sample this morning regarding her prognosis and she is now agreeable for home with hospice.   I went to see patient at bedside. I met Mrs. Milles in the hallway as she was ambulating with PT. She returned to the room to sit in the recliner. Husband Mitzi Hansen arrives to her room several minutes later.   Provided education and counseling at length on the philosophy and benefits of hospice care. Discussed that it offers a holistic approach to care in the setting of end-stage illness/disease, and is about supporting the patient where they are allowing the natural course to occur. Hospice can help a patient feel as good as possible for as long as possible. Discussed the hospice team includes RNs, physicians, social workers, and chaplains. They can provide personal care, support for the family, and help keep patient out of the hospital. Patient and husband are agreeable to plan of home with hospice and would prefer [facility].    Length of Stay: 10  Current Medications: Scheduled Meds:   allopurinol  50 mg Oral Daily   aspirin EC  81 mg Oral Daily   busPIRone  7.5 mg Oral BID   carvedilol  3.125 mg Oral BID WC   clopidogrel  75 mg Oral Daily   diclofenac Sodium  2 g Topical QID   heparin  5,000 Units Subcutaneous Q8H   hydrOXYzine  25 mg Oral QID    insulin aspart  0-5 Units Subcutaneous QHS   insulin aspart  0-9 Units Subcutaneous TID WC   ivabradine  7.5 mg Oral BID WC   levothyroxine  100 mcg Oral Once per day on Mon Tue Wed Thu Fri   multivitamin with minerals  1 tablet Oral Daily   pantoprazole  40 mg Oral Daily   potassium chloride  40 mEq Oral Daily   pravastatin  80 mg Oral q1800   pregabalin  150  mg Oral QHS   pregabalin  75 mg Oral Daily   sildenafil  60 mg Oral TID   sodium chloride flush  3 mL Intravenous Q12H   torsemide  100 mg Oral BID    Continuous Infusions:  sodium chloride 250 mL (10/18/20 1247)    PRN Meds: sodium chloride, acetaminophen, albuterol, fluticasone, sodium chloride flush  Physical Exam Constitutional:      General: She is not in acute distress.    Appearance: She is ill-appearing.  Cardiovascular:     Rate and Rhythm: Normal rate.  Pulmonary:     Effort: Pulmonary effort is normal.  Neurological:     Mental Status: She is alert.            Vital Signs: BP 139/61 (BP Location: Right Arm)   Pulse 76   Temp 98 F (36.7 C) (Oral)   Resp 20   Ht 5' 1"  (1.549 m)   Wt 93.1 kg   SpO2 100%   BMI 38.77 kg/m  SpO2: SpO2: 100 % O2 Device: O2 Device: Room Air O2 Flow Rate: O2 Flow Rate (L/min): 1.5 L/min  Intake/output summary:  Intake/Output Summary (Last 24 hours) at 10/24/2020 1434 Last data filed at 10/24/2020 1115 Gross per 24 hour  Intake 360 ml  Output 1650 ml  Net -1290 ml   LBM: Last BM Date: 10/22/20 Baseline Weight: Weight: 95.1 kg Most recent weight: Weight: 93.1 kg       Palliative Assessment/Data: PPS 50%      Palliative Care Assessment & Plan   HPI/Patient Profile: 75 y.o. female  with past medical history of chronic systolic heart failure due to non-ischemic cardiomyopathy, DM2, CKD (baseline creatinine 2.6), hypertension, and hyperlipidemia. She was seen for heart failure follow-up on 10/14/20 and direct admitted with AKI and volume overload.   Echo 10/16/20  showed EF < 20%. RV moderately reduced. Severe TR.   Right heart cath 6/25 demonstrated moderately to severely elevated biventricular filling pressures and severe mixed pulmonary HTN.   Assessment: - acute on chronic systolic heart failure due to NICM - acute on chronic renal failure - cardiorenal syndrome - severe tricuspid regurgitation - end-stage illness, hospice eligible  Recommendations/Plan: Code status changed to DNR/DNI  Home with hospice ([facility]) - spoke with [facility] and hospice liaison DME needs - oxygen, hospital bed, 2-in-1 seat PMT will continue to follow  Goals of Care and Additional Recommendations: Limitations on Scope of Treatment: No Hemodialysis  Code Status: DNR/DNI   Prognosis:  < 6 months  Discharge Planning: Home with Hospice  Thank you for allowing the Palliative Medicine Team to assist in the care of this patient.   Total Time 35 minutes Prolonged Time Billed  no       Greater than 50%  of this time was spent counseling and coordinating care related to the above assessment and plan.  Lavena Bullion, NP  Please contact Palliative Medicine Team phone at [phone] for questions and concerns.

## 2020-10-24 NOTE — Progress Notes (Addendum)
CSW spoke with the patient and her spouse at bedside regarding going home with hospice and offered patient choice and [name] reported preference for [employer]. CSW made referral to the hospital liaison with [facility] for the patient preference home with hospice. Patient possible discharge tomorrow pending medical readiness. [facility] to handle DME needs.   Grace Bradford, MSW, [place] Heart Failure Social Worker

## 2020-10-24 NOTE — Progress Notes (Signed)
Due Grace Bradford  Assessment/ Plan: Pt is a 75 y.o. yo female  with history of hypertension, HLD, DM, OSA, stroke, CAD, CHF who was admitted with CHF exacerbation on 6/22 treated with IV diuretics, seen as a consultation for the management of CKD stage IV and fluid management.  She follows with Dr. Hollie Salk at [facility].  #AKI on CKD stage IV, nonoliguric: It seems like the baseline creatinine level anywhere between 2-3.  UA bland.  Creatinine level 3.87 on admission which was improved initially with diuretics.  She has worsening heart failure EF <20 % with increased filling pressure.   She has been diuresing with higher dose of IV and oral diuretics.  Still looks volume overload and not much improvement.  Discussed with Dr. Haroldine Laws about end-stage heart disease and challenges to manage volume outpatient with both IV and oral diuretics.  She is now showing early sign of uremia.  With her poor cardiac function the dialysis will be very challenging for her and will not increase the quality of life.  She most likely will not tolerate it therefore not a candidate for outpatient dialysis.  Again, I discussed this with the patient and spoke with her daughter over the phone.  They are all in agreement.  Awaiting palliative consults team to see her.  I have discussed with her outpatient nephrologist Dr. Hollie Salk as well.   #Acute on chronic heart failure both right and left heart.  Recent EF with EF < 20%, severe TR and moderately reduced RV function.  Not much response with diuretics.  Management as above.  Discussed with Dr. Haroldine Laws.  Currently on oral torsemide with intermittent IV diuretics. Continue to hold Farxiga and Aldactone.   #Hypertension/volume: Continue current cardiac medication including carvedilol.  Diuretics as above.  BP acceptable.   #Anemia of CKD: Hemoglobin at goal.  Monitor lab.   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   #Hyponatremia, hypervolemic:  Received a dose of IV Lasix yesterday.  On oral torsemide.  Subjective: Seen and examined at bedside.  The urine output is around 1.8 L with IV diuretics.  She looks tired and mildly confused today.  Denies nausea, vomiting, chest pain or shortness of breath.  I spoke with her daughter over the phone. Objective Vital signs in last 24 hours: Vitals:   10/24/20 0004 10/24/20 0226 10/24/20 0339 10/24/20 0800  BP: (!) 124/55  133/63 139/61  Pulse: 67  72 76  Resp: 15  19 20   Temp: 98.4 F (36.9 C)  98.3 F (36.8 C) 98 F (36.7 C)  TempSrc: Oral  Oral Oral  SpO2: 100%  100% 100%  Weight:  93.1 kg    Height:       Weight change: -0.091 kg  Intake/Output Summary (Last 24 hours) at 10/24/2020 0856 Last data filed at 10/24/2020 1856 Gross per 24 hour  Intake 720 ml  Output 1850 ml  Net -1130 ml        Labs: Basic Metabolic Panel: Recent Labs  Lab 10/22/20 0333 10/23/20 0203 10/24/20 0253  NA 136 133* 136  K 3.5 4.2 4.0  CL 98 96* 98  CO2 28 26 28   GLUCOSE 195* 168* 206*  BUN 72* 77* 87*  CREATININE 2.36* 2.57* 2.76*  CALCIUM 8.6* 8.9 8.7*  PHOS 3.5 3.5 4.0    Liver Function Tests: Recent Labs  Lab 10/22/20 0333 10/23/20 0203 10/24/20 0253  ALBUMIN 2.8* 2.9* 2.8*    No results for input(s): LIPASE, AMYLASE  in the last 168 hours. No results for input(s): AMMONIA in the last 168 hours. CBC: No results for input(s): WBC, NEUTROABS, HGB, HCT, MCV, PLT in the last 168 hours.  Cardiac Enzymes: No results for input(s): CKTOTAL, CKMB, CKMBINDEX, TROPONINI in the last 168 hours. CBG: Recent Labs  Lab 10/23/20 0632 10/23/20 1125 10/23/20 1613 10/23/20 2107 10/24/20 0611  GLUCAP 175* 198* 192* 181* 203*     Iron Studies: No results for input(s): IRON, TIBC, TRANSFERRIN, FERRITIN in the last 72 hours. Studies/Results: No results found.  Medications: Infusions:  sodium chloride 250 mL (10/18/20 1247)    Scheduled Medications:  allopurinol  50 mg Oral  Daily   aspirin EC  81 mg Oral Daily   busPIRone  7.5 mg Oral BID   carvedilol  3.125 mg Oral BID WC   clopidogrel  75 mg Oral Daily   diclofenac Sodium  2 g Topical QID   heparin  5,000 Units Subcutaneous Q8H   hydrOXYzine  25 mg Oral QID   insulin aspart  0-5 Units Subcutaneous QHS   insulin aspart  0-9 Units Subcutaneous TID WC   ivabradine  7.5 mg Oral BID WC   levothyroxine  100 mcg Oral Once per day on Mon Tue Wed Thu Fri   multivitamin with minerals  1 tablet Oral Daily   pantoprazole  40 mg Oral Daily   potassium chloride  40 mEq Oral Daily   pravastatin  80 mg Oral q1800   pregabalin  150 mg Oral QHS   pregabalin  75 mg Oral Daily   sildenafil  60 mg Oral TID   sodium chloride flush  3 mL Intravenous Q12H   torsemide  60 mg Oral BID    have reviewed scheduled and prn medications.  Physical Exam: General: ill-looking female, sitting on chair, looks tired and mildly confused. Heart:RRR, systolic murmur, no rubs Lungs: Clear bilateral, no increased work of breathing Abdomen:soft, Non-tender, non-distended Extremities: LE edema present with bandage applied. Neurology: Alert, awake, no asterixis  Kyair Ditommaso Prasad Arlen Dupuis 10/24/2020,8:56 AM  LOS: 10 days

## 2020-10-24 NOTE — Progress Notes (Signed)
OT Cancellation Note  Patient Details Name: HARBOR PASTER MRN: [id number] DOB: June 07, 1945   Cancelled Treatment:    Reason Eval/Treat Not Completed: Other (comment) pt requesting to finish eating lunch prior to OT session, will check back as time allows for OT session.  Harley Alto., COTA/L Acute Rehabilitation Services [phone]   Precious Haws 10/24/2020, 12:46 PM

## 2020-10-24 NOTE — Discharge Summary (Addendum)
Advanced Heart Failure Team  Discharge Summary   Patient ID: Grace Bradford MRN: [id number], DOB/AGE: 1945-10-08 75 y.o. 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 Seen in HF Clinic in early June and was volume overloaded. Received IV lasix with [facility] with little improvement.   Admitted 10/14/20 with progressive volume overload (weight 211. Baseline 186-190lbs). SCr 2.6 -> 3.9. Started on IV lasix with only mild response. Scr dropped back to baseline at 2.6   Echo EF <20%. RV moderately reduced. Severe TR.   Underwent RHC on 10/15/20 RA = 15 RV = 78/24 PA = 86/33 (52) PCW = 22 Fick cardiac output/index = 5.8/3.00 PVR = 5.0 Ao sat = 99% PA sat = 62%, 63%   Lasix increased and metolazone added with little response. Creatinine worsened. Nephrology consulted and felt patient would not be candidate for HD due to severe HF.   After multiple discussions, it was felt that she had end-stage systolic HF with severe cardiorenal syndrome and the only option would be Hospice. Palliative Consulted and patient opted for d/c ome with hospice on Saturday 10/25/20  D/C meds:  0 Continue oral diuretics for maintenance - Continue Coreg to 3.125 mg bid  - Continue Sildenafil (for RV dysjunction). - Continue ASA and Plavix  - Off Spiro & dig w/ renal dysfunction - Plan to discharge home with hospice with further adjustment to medications    Discharge Vitals: Blood pressure 137/77, pulse 77, temperature 98.3 F (36.8 C), temperature  source Oral, resp. rate 20, height 5\' 1"  (1.549 m), weight 93.6 kg, SpO2 100 %.  Labs: Lab Results  Component Value Date   WBC 6.2 10/15/2020   HGB 10.5 (L) 10/15/2020   HGB 10.2 (L) 10/15/2020   HCT 31.0 (L) 10/15/2020   HCT 30.0 (L) 10/15/2020   MCV 69.4 (L) 10/15/2020   PLT 122 (L) 10/15/2020    Recent Labs  Lab 10/25/20 0319  NA 135  K 3.7  CL 96*  CO2 26  BUN 94*  CREATININE 2.75*  CALCIUM 9.1  GLUCOSE 170*   Lab Results  Component Value Date   CHOL 142 11/13/2013   HDL 54 11/13/2013   LDLCALC 66 11/13/2013   TRIG 108 11/13/2013   BNP (last 3 results) Recent Labs    09/03/20 1511 09/25/20 0842 10/14/20 1252  BNP 1,286.7* 1,752.8* 1,972.0*    ProBNP (last 3 results) No results for input(s): PROBNP in the last 8760 hours.   Diagnostic Studies/Procedures   No results found.  Discharge Medications   Allergies as of 10/25/2020       Reactions   Rocephin [ceftriaxone Sodium In Dextrose] Itching   Tolerated cefdinir 07/2017   Ace Inhibitors Cough        Medication List     STOP taking these medications    ARNICA EX   benzonatate 200 MG capsule Commonly known as: TESSALON   diazepam 5 MG tablet Commonly known as: VALIUM   fluticasone 50 MCG/ACT nasal spray Commonly known as: FLONASE   Ginger  Root 500 MG Caps   guaiFENesin 600 MG 12 hr tablet Commonly known as: MUCINEX   metolazone 2.5 MG tablet Commonly known as: ZAROXOLYN   pravastatin 80 MG tablet Commonly known as: PRAVACHOL   promethazine 25 MG tablet Commonly known as: PHENERGAN       TAKE these medications    albuterol (2.5 MG/3ML) 0.083% nebulizer solution Commonly known as: PROVENTIL Inhale 3 mLs into the lungs every 4 (four) hours as needed for shortness of breath.   ProAir RespiClick 315 (90 Base) MCG/ACT Aepb Generic drug: Albuterol Sulfate Inhale 2 puffs into the lungs daily as needed (sob/wheezing).   allopurinol 100 MG tablet Commonly known as:  ZYLOPRIM Take 50 mg by mouth daily.   aspirin EC 81 MG tablet Take 81 mg by mouth daily.   busPIRone 7.5 MG tablet Commonly known as: BUSPAR Take 7.5 mg by mouth 2 (two) times daily.   carvedilol 3.125 MG tablet Commonly known as: COREG Take 1 tablet (3.125 mg total) by mouth 2 (two) times daily with a meal. What changed:  medication strength how much to take   C[place] 450 MG Caps Take 450 mg by mouth daily.   clopidogrel 75 MG tablet Commonly known as: PLAVIX Take 75 mg by mouth daily.   Corlanor 7.5 MG Tabs tablet Generic drug: ivabradine TAKE 1 TABLET(7.5 MG) BY MOUTH TWICE DAILY WITH A MEAL   dapagliflozin propanediol 5 MG Tabs tablet Commonly known as: Farxiga Take 1 tablet (5 mg total) by mouth daily before breakfast.   diclofenac Sodium 1 % Gel Commonly known as: VOLTAREN Apply 2 g topically 4 (four) times daily as needed (pain).   fexofenadine 180 MG tablet Commonly known as: ALLEGRA Take 180 mg by mouth daily.   HYDROcodone-acetaminophen 10-325 MG tablet Commonly known as: NORCO Take 1 tablet by mouth every 6 (six) hours as needed for moderate pain.   hydrOXYzine 25 MG tablet Commonly known as: ATARAX/VISTARIL TAKE 1 TABLET(25 MG) BY MOUTH FOUR TIMES DAILY What changed: See the new instructions.   Levemir FlexTouch 100 UNIT/ML FlexPen Generic drug: insulin detemir Inject 5 Units into the skin in the morning.   levothyroxine 100 MCG tablet Commonly known as: SYNTHROID Take 100 mcg by mouth See admin instructions. Pt takes Monday through Friday and does not take on the weekends   lidocaine 5 % ointment Commonly known as: XYLOCAINE Apply 1 application topically 2 (two) times daily as needed for pain.   metaxalone 800 MG tablet Commonly known as: SKELAXIN Take 800 mg by mouth in the morning and at bedtime.   multivitamin with minerals Tabs tablet Take 1 tablet by mouth daily.   mupirocin ointment 2 % Commonly known as: BACTROBAN Apply  1 application topically daily.   omeprazole 40 MG capsule Commonly known as: PRILOSEC Take 40 mg by mouth daily.   ondansetron 4 MG tablet Commonly known as: ZOFRAN Take 4 mg by mouth every 8 (eight) hours as needed for vomiting or nausea.   potassium chloride SA 20 MEQ tablet Commonly known as: KLOR-CON Take 20 mEq by mouth daily as needed (low potassium). Takes with Metalazone   pregabalin 75 MG capsule Commonly known as: LYRICA Take 75-150 mg by mouth See admin instructions. 75 mg in the am and 150 mg at bedtimes   PROBIOTIC PO Take 1 capsule by mouth daily.   sildenafil 20 MG tablet Commonly known as: REVATIO TAKE 3 TABLETS (60 MG) BY MOUTH THREE TIMES DAILY   Soothe 0.6-0.6 %  Soln Generic drug: Propylene Glycol-Glycerin Place 1 drop into both eyes daily as needed (dry eyes).   torsemide 100 MG tablet Commonly known as: DEMADEX Take 1 tablet (100 mg total) by mouth every morning AND 0.5 tablets (50 mg total) every evening. And  as needed for weight gain > 233.   Trulicity 3 EO/7.1QR Sopn Generic drug: Dulaglutide Inject 3 mg into the skin once a week. Thursdays               Durable Medical Equipment  (From admission, onward)           Start     Ordered   10/23/20 0928  For home use only DME 4 wheeled rolling walker with seat  Once       Comments: HD  Question:  Patient needs a walker to treat with the following condition  Answer:  Weakness   10/23/20 9758            Disposition   The patient will be discharged in stable condition to home. Discharge Instructions     Diet - low sodium heart healthy   Complete by: As directed    Increase activity slowly   Complete by: As directed    No wound care   Complete by: As directed        Follow-up Information     [facility] Follow up.   Why: Bariatric rollator Contact information: [address] [place] [address] [phone]         [facility] Follow up.    Specialty: Hospice and Palliative Medicine Why: Home Hospice Contact information: [place] [address] [facility] Follow up on 11/07/2020.   Specialty: Cardiology Why: at 1:30 Contact information: [address] [id number] [place] [phone]                  Duration of Discharge Encounter: Greater than 35 minutes   Jarrett Soho  10/25/2020, 1:26 PM   Patient seen and examined and agree with Robbie Lis, PA as detailed above. Please see progress note from today. Going home with hospice.   Gwyndolyn Kaufman, MD

## 2020-10-24 NOTE — Progress Notes (Addendum)
Advanced Heart Failure Rounding Note  PCP-Cardiologist: None   Subjective:   Admitted with AKI and volume overload. Started on IV lasix. RHC yesterday demonstrated moderately to severely elevated biventricular filling pressures (R>L) and severe mixed pulmonary HTN.  Echo EF <20%. RV moderately reduced. Severe TR. In comparison to prior study, EF is down from 40-45%.    RHC  10/15/20 RA = 15 RV = 78/24 PA = 86/33 (52) PCW = 22 Fick cardiac output/index = 5.8/3.00 PVR = 5.0 Ao sat = 99% PA sat = 62%, 63%  Assessment: 1. Moderately to severely elevated biventricular filling pressures (R>L) 2. Severe mixed pulmonary HTN  Received dose of IV lasix 80mg  in addition to scheduled torsemide yesterday.  Weight unchanged.  1.8L output.  Eager to go home, wants to leave today.  Speaking with her she is drowsy, she has a poor understanding of her medical condition, says she thought she was getting better.  Says she's very tired, denies resting dyspnea.      Objective:   Weight Range: 93.1 kg Body mass index is 38.77 kg/m.   Vital Signs:   Temp:  [97.8 F (36.6 C)-98.4 F (36.9 C)] 98 F (36.7 C) (07/01 0800) Pulse Rate:  [64-76] 76 (07/01 0800) Resp:  [15-20] 20 (07/01 0800) BP: (111-163)/(52-70) 139/61 (07/01 0800) SpO2:  [100 %] 100 % (07/01 0800) Weight:  [93.1 kg] 93.1 kg (07/01 0226) Last BM Date: 10/22/20  Weight change: Filed Weights   10/22/20 0226 10/23/20 0247 10/24/20 0226  Weight: 93.2 kg 93.2 kg 93.1 kg    Intake/Output:   Intake/Output Summary (Last 24 hours) at 10/24/2020 0950 Last data filed at 10/24/2020 0960 Gross per 24 hour  Intake 720 ml  Output 1850 ml  Net -1130 ml      Physical Exam    General:  Elderly AAF, fatigued appearing, sitting up in chair. No respiratory difficulty HEENT: normal Neck: supple. JVD elevated to ear. Carotids 2+ bilat; no bruits. No lymphadenopathy or thyromegaly appreciated. Cor: PMI nondisplaced. Regular rate &  rhythm. No rubs, gallops or murmurs. Lungs: bilateral rales throughout all lung fields  Abdomen: obese, soft, nontender, nondistended. No hepatosplenomegaly. No bruits or masses. Good bowel sounds. Extremities: no cyanosis, clubbing, rash, 2+ bilateral LE edema + bilateral unna boots  Neuro: drowsy, oriented x 3, cranial nerves grossly intact. moves all 4 extremities w/o difficulty. Affect pleasant.  Telemetry   NSR 70s    Labs    CBC No results for input(s): WBC, NEUTROABS, HGB, HCT, MCV, PLT in the last 72 hours.  Basic Metabolic Panel Recent Labs    10/22/20 0333 10/23/20 0203 10/24/20 0253  NA 136 133* 136  K 3.5 4.2 4.0  CL 98 96* 98  CO2 28 26 28   GLUCOSE 195* 168* 206*  BUN 72* 77* 87*  CREATININE 2.36* 2.57* 2.76*  CALCIUM 8.6* 8.9 8.7*  MG 2.3 2.2  --   PHOS 3.5 3.5 4.0   Liver Function Tests Recent Labs    10/23/20 0203 10/24/20 0253  ALBUMIN 2.9* 2.8*   No results for input(s): LIPASE, AMYLASE in the last 72 hours. Cardiac Enzymes No results for input(s): CKTOTAL, CKMB, CKMBINDEX, TROPONINI in the last 72 hours.  BNP: BNP (last 3 results) Recent Labs    09/03/20 1511 09/25/20 0842 10/14/20 1252  BNP 1,286.7* 1,752.8* 1,972.0*    ProBNP (last 3 results) No results for input(s): PROBNP in the last 8760 hours.   D-Dimer No results for input(s):  DDIMER in the last 72 hours. Hemoglobin A1C No results for input(s): HGBA1C in the last 72 hours. Fasting Lipid Panel No results for input(s): CHOL, HDL, LDLCALC, TRIG, CHOLHDL, LDLDIRECT in the last 72 hours. Thyroid Function Tests No results for input(s): TSH, T4TOTAL, T3FREE, THYROIDAB in the last 72 hours.  Invalid input(s): FREET3   Other results:   Imaging    No results found.   Medications:     Scheduled Medications:  allopurinol  50 mg Oral Daily   aspirin EC  81 mg Oral Daily   busPIRone  7.5 mg Oral BID   carvedilol  3.125 mg Oral BID WC   clopidogrel  75 mg Oral Daily    diclofenac Sodium  2 g Topical QID   heparin  5,000 Units Subcutaneous Q8H   hydrOXYzine  25 mg Oral QID   insulin aspart  0-5 Units Subcutaneous QHS   insulin aspart  0-9 Units Subcutaneous TID WC   ivabradine  7.5 mg Oral BID WC   levothyroxine  100 mcg Oral Once per day on Mon Tue Wed Thu Fri   multivitamin with minerals  1 tablet Oral Daily   pantoprazole  40 mg Oral Daily   potassium chloride  40 mEq Oral Daily   pravastatin  80 mg Oral q1800   pregabalin  150 mg Oral QHS   pregabalin  75 mg Oral Daily   sildenafil  60 mg Oral TID   sodium chloride flush  3 mL Intravenous Q12H   torsemide  60 mg Oral BID    Infusions:  sodium chloride 250 mL (10/18/20 1247)    PRN Medications: sodium chloride, acetaminophen, albuterol, fluticasone, sodium chloride flush    Patient Profile   Grace Bradford is a 75 y/o woman with obesity, DM2, HTN, HL, CRI and HF with 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 Echo 3/18 EF 45-50%.  Admitted with AKI and volume overload.   Assessment/Plan  1. 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  --> Echo 06/2016, EF 45-50% - Echo 4/19 EF 40-45% - Echo 3/21 EF 35-40% moderate RV dysfunction - Diuretics had been cut back since 2/22 after admit for dehydration and AKI. SCr peaked to 4.5 - Echo 6/22 w/ EF < 20%, RV moderately reduced. Severe TR  - RHC w/ moderately to severely elevated biventricular filling pressures (R>L). RA 15. PCW 22. CO/CI normal - Markedly overloaded on examination, failing diuretic therapy with worsening renal fx, not a candidate for advanced therapies.  Her goals of care discussions ongoing but she has poor understanding of her condition and some early signs of uremia.  Spent a significant amount of time today trying to explain her clinical situation.   - Continue oral diuretics today for maintenance - Per nephrology, she should stay off of Farxiga given low GFR - Continue Coreg to 3.125 mg bid  -  Continue Sildenafil (for RV dysjunction). - Off Spiro & dig w/ renal dysfunction. - Continue fluid restriction  2. 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 Hollie Salk - SCr baseline ~2.6 - Renal US showed mildly increased renal cortical cortical echogenicity but otherwise unremarkable. - Myeloma w/u negative    - Admit creatinine 3.9--> 3.35->2.8 ->2.6 ->2.4 -> 2.3 -> 2.1->2.6->2.4->2.4->2.6>2.8 - Follow BMET daily . - Suspect we are nearing need for HD, but would be poor candidate given RV dysfunction   3. H/o Hypercalcemia  - Calcitrol and calcium replacement has been  discontinued. - Bone survey w/ no evidence of lytic or sclerotic lesions. Myeloma w/u negative    4. Type 2 DM  - Controlled. Hgb A1c 6.1.  - Holding Farxiga. - SSI  5. PVCs/Ventricular Bigeminy/ VT  - 29 beat run of VT 6/28 in setting of low mag, 1.7  - resolved after correction of hypokalemia/hypomagnesemia   - Keep K > 4.0 and Mg > 2.0   6. Severe TR - likely functional, RV moderately enlarged, moderately reduced systolic function   7. Hypomagnesemia - resolved   8. Hypervolemic Hyponatremia - Na 136 - Diuresis per above - Fluid restrict   Responding poorly to diuretics. Not a good candidate for HD. Palliative note not yet signed but based on what's written so far she wishes to remain full code and full scope of care. Needs ongoing goals of care discussions.    Length of Stay: [place], MD  10/24/2020, 9:50 AM  Advanced Heart Failure Team Pager [phone] (M-F; 7a - 5p)  Please contact [facility] for night-coverage after hours (5p -7a ) and weekends on [email or web address]    Katherine Roan, MD  9:50 AM   Patient seen and examined with the above-signed Advanced Practice Provider and/or Housestaff. I personally reviewed laboratory data, imaging studies and relevant notes. I independently examined the patient and formulated the important aspects of the plan. I have edited the note to  reflect any of my changes or salient points. I have personally discussed the plan with the patient and/or family.  Not responding well to IV lasix. Weight unchanged. SCr climbing. Denies SOB, orthopnea or PND  General:  Sitting in chair No resp difficulty HEENT: normal Neck: supple. JVP to ear  Carotids 2+ bilat; no bruits. No lymphadenopathy or thryomegaly appreciated. Cor: PMI nondisplaced. Regular rate & rhythm. 2/6 TR Lungs: clear Abdomen: soft, nontender, nondistended. No hepatosplenomegaly. No bruits or masses. Good bowel sounds. Extremities: no cyanosis, clubbing, rash, 2-3+ edema + UNNA Neuro: alert & orientedx3, cranial nerves grossly intact. moves all 4 extremities w/o difficulty. Affect pleasant  I had a long talk with her about her situation. She has end-stage HF with severe cardiorenal syndrome. We have not been able to manage her volume status well despite very aggressive attempts. She has been deemed not to be a candidate for HD due to severe biventricular HF.   We discussed the options and I told her that going home with Hospice is really the only option I thing she has now to avoid significant discomfort and suffering. She is in agreement. We also discussed code status and will change to No Code.  I d/w Palliative Care and SW who will see her again today and arrange for home with Hospice tomorrow.   Glori Bickers, MD  4:51 PM

## 2020-10-24 NOTE — Progress Notes (Signed)
Physical Therapy Treatment Patient Details Name: Grace Bradford MRN: [id number] DOB: Dec 18, 1945 Today's Date: 10/24/2020    History of Present Illness Pt is a 75 y.o. female admitted 10/14/20 with SOB. Workup for acute on chronic HF exacerbation, AKI. S/p RHC 6/22. PMH includes CHF, CVA, HTN, gout, MI, neuropathy, OSA, DM2, back pain.   PT Comments    Pt progressing well with mobility. Pt's new rollator in room, adjusted to pt's height preference. Pt tolerated transfer and gait training with rollator, supervision and verbal cues for sequencing with rollator brakes. Pt requires seated rest with longer ambulation distance secondary to fatigue. Reviewed educ re: activity recommendations, DME use, fall risk reduction, importance of mobility. Pt reports hopeful for d/c home tomorrow; will have necessary support from family. If to remain admitted, will continue to follow acutely.  SpO2 >/91% on RA, HR 78    Follow Up Recommendations  Home health PT;Supervision for mobility/OOB     Equipment Recommendations   (rollator delivered)    Recommendations for Other Services       Precautions / Restrictions Precautions Precautions: Fall Restrictions Weight Bearing Restrictions: No    Mobility  Bed Mobility               General bed mobility comments: Received sitting in recliner    Transfers Overall transfer level: Needs assistance Equipment used: 4-wheeled walker Transfers: Sit to/from Stand Sit to Stand: Supervision         General transfer comment: multiple sit<>stands from recliner to rollator with supervision for safety; pt with inconsistent carry over of need to lock rollator brakes; trialled sit<>stand from rollator seat in hallway requiring mod verbal cues for safe technique  Ambulation/Gait Ambulation/Gait assistance: Supervision Gait Distance (Feet): 62 Feet (+62) Assistive device: 4-wheeled walker Gait Pattern/deviations: Step-through pattern;Decreased stride  length;Trunk flexed Gait velocity: Decreased   General Gait Details: Slow, mostly steady gait with rollator and supervision for safety; gait speed and stability improving; pt requiring 1x seated rest break on rollator seat secondary to fatigue   Stairs             Wheelchair Mobility    Modified Rankin (Stroke Patients Only)       Balance Overall balance assessment: Needs assistance Sitting-balance support: No upper extremity supported;Feet supported Sitting balance-Leahy Scale: Good     Standing balance support: Bilateral upper extremity supported;No upper extremity supported Standing balance-Leahy Scale: Fair Standing balance comment: can static stand without UE support; static and dynamic stability improved with UE support                            Cognition Arousal/Alertness: Awake/alert Behavior During Therapy: WFL for tasks assessed/performed Overall Cognitive Status: Within Functional Limits for tasks assessed                                 General Comments: WFL for simple tasks; question if pt uses humor to mask some decreased insight into deficits; suspect near baseline cognition      Exercises      General Comments General comments (skin integrity, edema, etc.): Pt's new rollator had been delivered to room upon arrival; adjusted rollator to pt's preferred height (slightly higher than recommended, pt aware). SpO2 92% on RA, HR 78 with ambulation. Pt reports preparing for d/c home tomorrow      Pertinent Vitals/Pain Pain Assessment: Faces Faces Pain Scale:  No hurt Pain Intervention(s): Monitored during session    Home Living                      Prior Function            PT Goals (current goals can now be found in the care plan section) Progress towards PT goals: Progressing toward goals    Frequency    Min 3X/week      PT Plan Current plan remains appropriate    Co-evaluation               AM-PAC PT "6 Clicks" Mobility   Outcome Measure  Help needed turning from your back to your side while in a flat bed without using bedrails?: None Help needed moving from lying on your back to sitting on the side of a flat bed without using bedrails?: None Help needed moving to and from a bed to a chair (including a wheelchair)?: A Little Help needed standing up from a chair using your arms (e.g., wheelchair or bedside chair)?: A Little Help needed to walk in hospital room?: A Little Help needed climbing 3-5 steps with a railing? : A Little 6 Click Score: 20    End of Session   Activity Tolerance: Patient tolerated treatment well Patient left: in chair;with call bell/phone within reach;with chair alarm set;Other (comment) (with NP present) Nurse Communication: Mobility status PT Visit Diagnosis: Muscle weakness (generalized) (M62.81);Other abnormalities of gait and mobility (R26.89) Pain - Right/Left: Right Pain - part of body: Leg     Time: 4210-3128 PT Time Calculation (min) (ACUTE ONLY): 26 min  Charges:  $Gait Training: 8-22 mins $Therapeutic Activity: 8-22 mins                     Mabeline Caras, PT, DPT Acute Rehabilitation Services  Pager [phone] Office [place] 10/24/2020, 2:39 PM

## 2020-10-25 LAB — RENAL FUNCTION PANEL
Albumin: 3.1 g/dL — ABNORMAL LOW (ref 3.5–5.0)
Anion gap: 13 (ref 5–15)
BUN: 94 mg/dL — ABNORMAL HIGH (ref 8–23)
CO2: 26 mmol/L (ref 22–32)
Calcium: 9.1 mg/dL (ref 8.9–10.3)
Chloride: 96 mmol/L — ABNORMAL LOW (ref 98–111)
Creatinine, Ser: 2.75 mg/dL — ABNORMAL HIGH (ref 0.44–1.00)
GFR, Estimated: 18 mL/min — ABNORMAL LOW (ref >60)
Glucose, Bld: 170 mg/dL — ABNORMAL HIGH (ref 70–99)
Phosphorus: 3.5 mg/dL (ref 2.5–4.6)
Potassium: 3.7 mmol/L (ref 3.5–5.1)
Sodium: 135 mmol/L (ref 135–145)

## 2020-10-25 LAB — GLUCOSE, CAPILLARY
Glucose-Capillary: 164 mg/dL — ABNORMAL HIGH (ref 70–99)
Glucose-Capillary: 154 mg/dL — ABNORMAL HIGH (ref 70–99)

## 2020-10-25 MED ORDER — CARVEDILOL 3.125 MG PO TABS: 3.1250 mg | ORAL_TABLET | Freq: Two times a day (BID) | ORAL | 6 refills | Status: AC

## 2020-10-25 MED ORDER — ONDANSETRON HCL 4 MG/2ML IJ SOLN
4.0000 mg | Freq: Three times a day (TID) | INTRAMUSCULAR | Status: DC | PRN
  Filled 2020-10-25: qty 2

## 2020-10-25 NOTE — Progress Notes (Signed)
Progress Note  Patient Name: Grace Bradford Date of Encounter: 10/25/2020  [facility] Cardiologist: None   Subjective   Long discussion held with HF team, palliative and the patient given end-stage HR with severe cardiorenal syndrome not responsive to diuretics. Patient not eligible for HD. Given limited therapeutic options, she has now transitioned to palliative care measures.   Today, the patient states she feels okay. She is excited to go home.   Inpatient Medications    Scheduled Meds:  allopurinol  50 mg Oral Daily   aspirin EC  81 mg Oral Daily   busPIRone  7.5 mg Oral BID   carvedilol  3.125 mg Oral BID WC   clopidogrel  75 mg Oral Daily   diclofenac Sodium  2 g Topical QID   heparin  5,000 Units Subcutaneous Q8H   hydrOXYzine  25 mg Oral QID   insulin aspart  0-5 Units Subcutaneous QHS   insulin aspart  0-9 Units Subcutaneous TID WC   ivabradine  7.5 mg Oral BID WC   levothyroxine  100 mcg Oral Once per day on Mon Tue Wed Thu Fri   multivitamin with minerals  1 tablet Oral Daily   pantoprazole  40 mg Oral Daily   potassium chloride  40 mEq Oral Daily   pravastatin  80 mg Oral q1800   pregabalin  150 mg Oral QHS   pregabalin  75 mg Oral Daily   sildenafil  60 mg Oral TID   sodium chloride flush  3 mL Intravenous Q12H   torsemide  100 mg Oral BID   Continuous Infusions:  sodium chloride 250 mL (10/18/20 1247)   PRN Meds: sodium chloride, acetaminophen, albuterol, fluticasone, ondansetron (ZOFRAN) IV, sodium chloride flush   Vital Signs    Vitals:   10/24/20 2348 10/25/20 0156 10/25/20 0339 10/25/20 0736  BP: (!) 130/59  137/77   Pulse: 66  75 77  Resp: 18  18 20   Temp: 98 F (36.7 C)  (!) 97.5 F (36.4 C) 98.3 F (36.8 C)  TempSrc: Oral  Oral Oral  SpO2: 96%  100% 100%  Weight:  93.6 kg    Height:        Intake/Output Summary (Last 24 hours) at 10/25/2020 1115 Last data filed at 10/25/2020 0730 Gross per 24 hour  Intake 480 ml  Output 1350 ml   Net -870 ml   Last 3 Weights 10/25/2020 10/24/2020 10/23/2020  Weight (lbs) 206 lb 6.4 oz 205 lb 3.2 oz 205 lb 6.4 oz  Weight (kg) 93.622 kg 93.078 kg 93.169 kg      Telemetry    NSR with occasional PVCs - Personally Reviewed  ECG    No new tracing - Personally Reviewed  Physical Exam   GEN: Comfortable, awake and alert, sitting up in a chair Neck: JVD to ear Cardiac: RRR, no murmurs  Respiratory: Crackles at the bases bilaterally GI: Soft, nontender, non-distended  MS: 1-2+ pitting edema, warm Neuro:  Nonfocal  Psych: Normal affect   Labs    High Sensitivity Troponin:  No results for input(s): TROPONINIHS in the last 720 hours.    Chemistry Recent Labs  Lab 10/23/20 0203 10/24/20 0253 10/25/20 0319  NA 133* 136 135  K 4.2 4.0 3.7  CL 96* 98 96*  CO2 26 28 26   GLUCOSE 168* 206* 170*  BUN 77* 87* 94*  CREATININE 2.57* 2.76* 2.75*  CALCIUM 8.9 8.7* 9.1  ALBUMIN 2.9* 2.8* 3.1*  GFRNONAA 19* 18* 18*  ANIONGAP 11 10 13      HematologyNo results for input(s): WBC, RBC, HGB, HCT, MCV, MCH, MCHC, RDW, PLT in the last 168 hours.  BNPNo results for input(s): BNP, PROBNP in the last 168 hours.   DDimer No results for input(s): DDIMER in the last 168 hours.   Radiology    No results found.  Cardiac Studies   Echo EF <20%. RV moderately reduced. Severe TR. In comparison to prior study, EF is down from 40-45%.   RHC  10/15/20 RA = 15 RV = 78/24 PA = 86/33 (52) PCW = 22 Fick cardiac output/index = 5.8/3.00 PVR = 5.0 Ao sat = 99% PA sat = 62%, 63%   Patient Profile     75 y.o. 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 RHC with biventricular failure, deemed not a candidate for HD. Now planned for home with hospice.  Assessment & Plan    #Acute on chronic Systolic Heart Failure with R>>L symptoms  #End stage HF now transitioned to palliative care: - NICM, EF 30%  (10/2014) - Echo 06/2016, EF 45-50% - Echo 4/19 EF 40-45% - Echo 3/21 EF 35-40% moderate RV dysfunction - Echo 6/22 w/ EF < 20%, RV moderately reduced. Severe TR - RHC w/ moderately to severely elevated biventricular filling pressures (R>L). RA 15. PCW 22. CO/CI normal - Markedly overloaded on examination, failing diuretic therapy with worsening renal fx, not a candidate for advanced therapies-->now transitioned to hospice care - Continue oral diuretics for maintenance - Continue Coreg to 3.125 mg bid  - Continue Sildenafil (for RV dysjunction). 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

## 2020-10-25 NOTE — TOC Progression Note (Signed)
Transition of Care [facility]) - Progression Note    Patient Details  Name: Grace Bradford MRN: [id number] Date of Birth: 1946-04-02  [facility]) CM/SW Contact  Zenon Mayo, RN Phone Number: 10/25/2020, 9:12 AM  Clinical Narrative:    Per Chrislyn, the DME has not been delivered yet, she is still trying to contact spouse.  NCM informed patient to inform her spouse that he needs to answer the phone because hospice is trying to contact him.  Chrislyn will let this NCM know update on DME.    Expected Discharge Plan: Home/Self Care Barriers to Discharge: Continued Medical Work up  Expected Discharge Plan and Services Expected Discharge Plan: Home/Self Care   Discharge Planning Services: CM Consult Post Acute Care Choice: NA Living arrangements for the past 2 months: Single Family Home                 DME Arranged: Walker rolling with seat ([employer] will supply the DME) DME Agency: [employer] Date DME Agency Contacted: 10/16/20 Time DME Agency Contacted: 81 Representative spoke with at DME Agency: Adela Lank HH Arranged: RN [place] Agency:  ([place]) Date [place]: 10/24/20 Time [place]: 4604 Representative spoke with at [place]: [place] ([place]) Interventions    Readmission Risk Interventions Readmission Risk Prevention Plan 10/16/2020  Transportation Screening Complete  Medication Review Press photographer) Complete  PCP or Specialist appointment within 3-5 days of discharge Complete  HRI or [place] Complete  SW Recovery Care/Counseling Consult Complete  [place] Not Applicable  Some recent data might be hidden

## 2020-10-25 NOTE — TOC Transition Note (Signed)
Transition of Care [facility]) - CM/SW Discharge Note   Patient Details  Name: Grace Bradford MRN: [id number] Date of Birth: July 10, 1945  Transition of Care [facility]) CM/SW Contact:  Zenon Mayo, RN Phone Number: 10/25/2020, 1:06 PM   Clinical Narrative:    Patient is for dc today, home with hospice, the DME has been delivered to the house.  Per Husband, his daughter will be transporting patient home and will bring an oxygen tank  with her for patient to go home with.  DNR has been signed and is on chart.   Final next level of care: Home w Hospice Care Barriers to Discharge: No Barriers Identified   Patient Goals and CMS Choice Patient states their goals for this hospitalization and ongoing recovery are:: home with hospice CMS Medicare.gov Compare Post Acute Care list provided to:: Patient Choice offered to / list presented to : Patient  Discharge Placement                       Discharge Plan and Services   Discharge Planning Services: CM Consult Post Acute Care Choice: NA          DME Arranged: Walker rolling with seat ([facility] will supply the DME) DME Agency: [employer] Date DME Agency Contacted: 10/16/20 Time DME Agency Contacted: 74 Representative spoke with at DME Agency: Adela Lank HH Arranged: RN [place] Agency:  ([place]) Date [place]: 10/24/20 Time [place]: 7564 Representative spoke with at [place]: [place] ([place]) Interventions     Readmission Risk Interventions Readmission Risk Prevention Plan 10/16/2020  Transportation Screening Complete  Medication Review Press photographer) Complete  PCP or Specialist appointment within 3-5 days of discharge Complete  HRI or [place] Complete  SW Recovery Care/Counseling Consult Complete  [place] Not Applicable  Some recent data might be hidden

## 2020-10-25 NOTE — Progress Notes (Signed)
[facility] NEPHROLOGY PROGRESS NOTE  Assessment/ Plan: Pt is a 75 y.o. yo female  with history of hypertension, HLD, DM, OSA, stroke, CAD, CHF who was admitted with CHF exacerbation on 6/22 treated with IV diuretics, seen as a consultation for the management of CKD stage IV and fluid management.  She follows with Dr. Hollie Salk at [facility].  #AKI on CKD stage IV, nonoliguric: It seems like the baseline creatinine level anywhere between 2-3.  UA bland.  Creatinine level 3.87 on admission which was improved initially with diuretics.  She has worsening heart failure EF <20 % with increased filling pressure.   She has been diuresing with higher dose of IV and oral diuretics.  Still looks volume overload and not much improvement.  Discussed with Dr. Haroldine Laws about end-stage heart disease and challenges to manage volume outpatient with both IV and oral diuretics.  She is now showing early sign of uremia.  With her poor cardiac function the dialysis will be very challenging for her and will not increase the quality of life.  She most likely will not tolerate it therefore not a candidate for outpatient dialysis.  Seen by palliative care team and now patient going home with hospice care.  I have discussed with cardiologist, patient's primary nephrologist Dr. Hollie Salk and patient's daughter over the phone.   #Acute on chronic heart failure both right and left heart.  Recent EF with EF < 20%, severe TR and moderately reduced RV function.  Not much response with diuretics.  Management as above.  Discussed with Dr. Haroldine Laws.  The oral torsemide dose increased.  Continue to hold Farxiga and Aldactone.   #Hypertension/volume: Continue current cardiac medication including carvedilol.  Diuretics as above.  BP acceptable.   #Anemia of CKD: Hemoglobin at goal.  Monitor lab.   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   #Hyponatremia, hypervolemic: Improved with loop diuretics.  She is going home with  hospice.  Continue to follow outpatient. Sign off, please call back with question.  Subjective: Seen and examined at bedside.  Patient reports that she is going home today with hospice.  The urine output is around 1.6 L.  She denies nausea, vomiting, chest pain, shortness of breath. Objective Vital signs in last 24 hours: Vitals:   10/24/20 2348 10/25/20 0156 10/25/20 0339 10/25/20 0736  BP: (!) 130/59  137/77   Pulse: 66  75 77  Resp: 18  18 20   Temp: 98 F (36.7 C)  (!) 97.5 F (36.4 C) 98.3 F (36.8 C)  TempSrc: Oral  Oral Oral  SpO2: 96%  100% 100%  Weight:  93.6 kg    Height:       Weight change: 0.544 kg  Intake/Output Summary (Last 24 hours) at 10/25/2020 0848 Last data filed at 10/25/2020 0730 Gross per 24 hour  Intake 480 ml  Output 1650 ml  Net -1170 ml        Labs: Basic Metabolic Panel: Recent Labs  Lab 10/23/20 0203 10/24/20 0253 10/25/20 0319  NA 133* 136 135  K 4.2 4.0 3.7  CL 96* 98 96*  CO2 26 28 26   GLUCOSE 168* 206* 170*  BUN 77* 87* 94*  CREATININE 2.57* 2.76* 2.75*  CALCIUM 8.9 8.7* 9.1  PHOS 3.5 4.0 3.5    Liver Function Tests: Recent Labs  Lab 10/23/20 0203 10/24/20 0253 10/25/20 0319  ALBUMIN 2.9* 2.8* 3.1*    No results for input(s): LIPASE, AMYLASE in the last 168 hours. No results for input(s):  AMMONIA in the last 168 hours. CBC: No results for input(s): WBC, NEUTROABS, HGB, HCT, MCV, PLT in the last 168 hours.  Cardiac Enzymes: No results for input(s): CKTOTAL, CKMB, CKMBINDEX, TROPONINI in the last 168 hours. CBG: Recent Labs  Lab 10/24/20 0611 10/24/20 1032 10/24/20 1528 10/24/20 2113 10/25/20 0526  GLUCAP 203* 189* 259* 168* 164*     Iron Studies: No results for input(s): IRON, TIBC, TRANSFERRIN, FERRITIN in the last 72 hours. Studies/Results: No results found.  Medications: Infusions:  sodium chloride 250 mL (10/18/20 1247)    Scheduled Medications:  allopurinol  50 mg Oral Daily   aspirin EC  81 mg  Oral Daily   busPIRone  7.5 mg Oral BID   carvedilol  3.125 mg Oral BID WC   clopidogrel  75 mg Oral Daily   diclofenac Sodium  2 g Topical QID   heparin  5,000 Units Subcutaneous Q8H   hydrOXYzine  25 mg Oral QID   insulin aspart  0-5 Units Subcutaneous QHS   insulin aspart  0-9 Units Subcutaneous TID WC   ivabradine  7.5 mg Oral BID WC   levothyroxine  100 mcg Oral Once per day on Mon Tue Wed Thu Fri   multivitamin with minerals  1 tablet Oral Daily   pantoprazole  40 mg Oral Daily   potassium chloride  40 mEq Oral Daily   pravastatin  80 mg Oral q1800   pregabalin  150 mg Oral QHS   pregabalin  75 mg Oral Daily   sildenafil  60 mg Oral TID   sodium chloride flush  3 mL Intravenous Q12H   torsemide  100 mg Oral BID    have reviewed scheduled and prn medications.  Physical Exam: General: Sitting on chair, not in distress. Heart:RRR, systolic murmur, no rubs Lungs: Clear bilateral, no increased work of breathing Abdomen:soft, Non-tender, non-distended Extremities: LE edema present with bandage applied. Neurology: Alert, awake, no asterixis  Buell Parcel Prasad Latorya Bautch 10/25/2020,8:48 AM  LOS: 11 days

## 2020-10-25 NOTE — Progress Notes (Signed)
Daily Progress Note   Patient Name: Grace Bradford       Date: 10/25/2020 DOB: 1945-06-20  Age: 75 y.o. MRN#: [id number] Attending Physician: Jolaine Artist, MD Primary Care Physician: Jani Gravel, MD Admit Date: 10/14/2020  Reason for Consultation/Follow-up: Establishing goals of care "worsened kidney function with end stage heart disease, poor candidate for dialysis"  Subjective: Patient reports that hospital bed and oxygen has been delivered to her home. She is anxious to go home and looking forward to spending time with her family.   Discussed the concept of medications for comfort. At home, patient takes diazepam (Valium) as needed for anxiety and hydrocodone-acetaminophen (Norco) as needed for pain. She reports this regimen works well for her and that she has plenty of each medication currently available at home.   Length of Stay: 11  Current Medications: Scheduled Meds:   allopurinol  50 mg Oral Daily   aspirin EC  81 mg Oral Daily   busPIRone  7.5 mg Oral BID   carvedilol  3.125 mg Oral BID WC   clopidogrel  75 mg Oral Daily   diclofenac Sodium  2 g Topical QID   heparin  5,000 Units Subcutaneous Q8H   hydrOXYzine  25 mg Oral QID   insulin aspart  0-5 Units Subcutaneous QHS   insulin aspart  0-9 Units Subcutaneous TID WC   ivabradine  7.5 mg Oral BID WC   levothyroxine  100 mcg Oral Once per day on Mon Tue Wed Thu Fri   multivitamin with minerals  1 tablet Oral Daily   pantoprazole  40 mg Oral Daily   potassium chloride  40 mEq Oral Daily   pravastatin  80 mg Oral q1800   pregabalin  150 mg Oral QHS   pregabalin  75 mg Oral Daily   sildenafil  60 mg Oral TID   sodium chloride flush  3 mL Intravenous Q12H   torsemide  100 mg Oral BID    Continuous Infusions:  sodium  chloride 250 mL (10/18/20 1247)    PRN Meds: sodium chloride, acetaminophen, albuterol, fluticasone, ondansetron (ZOFRAN) IV, sodium chloride flush          Vital Signs: BP 137/77 (BP Location: Left Arm)   Pulse 77   Temp 98.3 F (36.8 C) (Oral)   Resp 20  Ht 5\' 1"  (1.549 m)   Wt 93.6 kg   SpO2 100%   BMI 39.00 kg/m  SpO2: SpO2: 100 % O2 Device: O2 Device: Nasal Cannula O2 Flow Rate: O2 Flow Rate (L/min): 1.5 L/min       Palliative Assessment/Data: PPS 50%        Palliative Care Assessment & Plan   HPI/Patient Profile: 75 y.o. female  with past medical history of chronic systolic heart failure due to non-ischemic cardiomyopathy, DM2, CKD (baseline creatinine 2.6), hypertension, and hyperlipidemia. She was seen for heart failure follow-up on 10/14/20 and direct admitted with AKI and volume overload.   Echo 10/16/20 showed EF < 20%. RV moderately reduced. Severe TR.   Right heart cath 6/25 demonstrated moderately to severely elevated biventricular filling pressures and severe mixed pulmonary HTN.    Assessment: - acute on chronic systolic heart failure due to NICM - acute on chronic renal failure - cardiorenal syndrome - severe tricuspid regurgitation - end-stage illness, hospice eligible  Recommendations/Plan: DNR/DNI as previously documented - durable DNR form signed and placed on chart Discharging home today with hospice Continue home diazepam (Valium) as needed for anxiety and hydrocodone-acetaminophen (Norco) as needed for pain  Goals of Care and Additional Recommendations: Limitations on Scope of Treatment: No Hemodialysis  Code Status:  DNR/DNI  Prognosis:  < 6 months  Discharge Planning: Home with Hospice   Thank you for allowing the Palliative Medicine Team to assist in the care of this patient.   Total Time 15 minutes Prolonged Time Billed  no       Greater than 50%  of this time was spent counseling and coordinating care related to the  above assessment and plan.  Lavena Bullion, NP  Please contact Palliative Medicine Team phone at [phone] for questions and concerns.

## 2020-10-25 NOTE — Progress Notes (Signed)
Manufacturing engineer [facility]) Hospital Liaison: RN note     Notified by Transition of Care Manger of patient/family request for [facility] services at home after discharge. Chart and patient information have been reviewed by [facility] physician. Hospice eligibility confirmed.  Writer spoke with pt's husband Mitzi Hansen to initiate education related to hospice philosophy, services and team approach to care.  Mitzi Hansen verbalized understanding of information given. Per discussion, plan is for discharge home by private vehicle.    Please send signed and completed DNR form home with patient/family.   Please provide prescriptions for mediations at discharge to ensure comfort until patient can be admitted onto hospice services.   DME has been confirmed to be delivered by pt's husband, Mitzi Hansen. TOC Deborah made aware.  Please do not hesitate to call with questions.   Thank you for the opportunity to participate in this patient's care.  Domenic Moras, BSN, RN       [place] (listed on AMION under [id number] [phone]  [phone] (24h on call)

## 2020-10-26 ENCOUNTER — Other Ambulatory Visit (HOSPITAL_COMMUNITY): Payer: Self-pay | Admitting: Internal Medicine

## 2020-10-27 DIAGNOSIS — N184 Chronic kidney disease, stage 4 (severe): Secondary | ICD-10-CM | POA: Diagnosis not present

## 2020-10-27 DIAGNOSIS — I252 Old myocardial infarction: Secondary | ICD-10-CM | POA: Diagnosis not present

## 2020-10-27 DIAGNOSIS — J302 Other seasonal allergic rhinitis: Secondary | ICD-10-CM | POA: Diagnosis not present

## 2020-10-27 DIAGNOSIS — I5040 Unspecified combined systolic (congestive) and diastolic (congestive) heart failure: Secondary | ICD-10-CM | POA: Diagnosis not present

## 2020-10-27 DIAGNOSIS — I472 Ventricular tachycardia: Secondary | ICD-10-CM | POA: Diagnosis not present

## 2020-10-27 DIAGNOSIS — I071 Rheumatic tricuspid insufficiency: Secondary | ICD-10-CM | POA: Diagnosis not present

## 2020-10-27 DIAGNOSIS — K219 Gastro-esophageal reflux disease without esophagitis: Secondary | ICD-10-CM | POA: Diagnosis not present

## 2020-10-27 DIAGNOSIS — I679 Cerebrovascular disease, unspecified: Secondary | ICD-10-CM | POA: Diagnosis not present

## 2020-10-27 DIAGNOSIS — I272 Pulmonary hypertension, unspecified: Secondary | ICD-10-CM | POA: Diagnosis not present

## 2020-10-27 DIAGNOSIS — E039 Hypothyroidism, unspecified: Secondary | ICD-10-CM | POA: Diagnosis not present

## 2020-10-27 DIAGNOSIS — K449 Diaphragmatic hernia without obstruction or gangrene: Secondary | ICD-10-CM | POA: Diagnosis not present

## 2020-10-27 DIAGNOSIS — K648 Other hemorrhoids: Secondary | ICD-10-CM | POA: Diagnosis not present

## 2020-10-27 DIAGNOSIS — E8770 Fluid overload, unspecified: Secondary | ICD-10-CM | POA: Diagnosis not present

## 2020-10-27 DIAGNOSIS — M109 Gout, unspecified: Secondary | ICD-10-CM | POA: Diagnosis not present

## 2020-10-29 DIAGNOSIS — E8770 Fluid overload, unspecified: Secondary | ICD-10-CM | POA: Diagnosis not present

## 2020-10-29 DIAGNOSIS — N184 Chronic kidney disease, stage 4 (severe): Secondary | ICD-10-CM | POA: Diagnosis not present

## 2020-10-29 DIAGNOSIS — I272 Pulmonary hypertension, unspecified: Secondary | ICD-10-CM | POA: Diagnosis not present

## 2020-10-29 DIAGNOSIS — I472 Ventricular tachycardia: Secondary | ICD-10-CM | POA: Diagnosis not present

## 2020-10-29 DIAGNOSIS — I5040 Unspecified combined systolic (congestive) and diastolic (congestive) heart failure: Secondary | ICD-10-CM | POA: Diagnosis not present

## 2020-10-31 DIAGNOSIS — I472 Ventricular tachycardia: Secondary | ICD-10-CM | POA: Diagnosis not present

## 2020-10-31 DIAGNOSIS — I272 Pulmonary hypertension, unspecified: Secondary | ICD-10-CM | POA: Diagnosis not present

## 2020-10-31 DIAGNOSIS — N184 Chronic kidney disease, stage 4 (severe): Secondary | ICD-10-CM | POA: Diagnosis not present

## 2020-10-31 DIAGNOSIS — I5040 Unspecified combined systolic (congestive) and diastolic (congestive) heart failure: Secondary | ICD-10-CM | POA: Diagnosis not present

## 2020-10-31 DIAGNOSIS — E8770 Fluid overload, unspecified: Secondary | ICD-10-CM | POA: Diagnosis not present

## 2020-11-03 DIAGNOSIS — N184 Chronic kidney disease, stage 4 (severe): Secondary | ICD-10-CM | POA: Diagnosis not present

## 2020-11-03 DIAGNOSIS — E8770 Fluid overload, unspecified: Secondary | ICD-10-CM | POA: Diagnosis not present

## 2020-11-03 DIAGNOSIS — I272 Pulmonary hypertension, unspecified: Secondary | ICD-10-CM | POA: Diagnosis not present

## 2020-11-03 DIAGNOSIS — I472 Ventricular tachycardia: Secondary | ICD-10-CM | POA: Diagnosis not present

## 2020-11-03 DIAGNOSIS — I5040 Unspecified combined systolic (congestive) and diastolic (congestive) heart failure: Secondary | ICD-10-CM | POA: Diagnosis not present

## 2020-11-06 DIAGNOSIS — I5040 Unspecified combined systolic (congestive) and diastolic (congestive) heart failure: Secondary | ICD-10-CM | POA: Diagnosis not present

## 2020-11-06 DIAGNOSIS — I472 Ventricular tachycardia: Secondary | ICD-10-CM | POA: Diagnosis not present

## 2020-11-06 DIAGNOSIS — I272 Pulmonary hypertension, unspecified: Secondary | ICD-10-CM | POA: Diagnosis not present

## 2020-11-06 DIAGNOSIS — E8770 Fluid overload, unspecified: Secondary | ICD-10-CM | POA: Diagnosis not present

## 2020-11-06 DIAGNOSIS — N184 Chronic kidney disease, stage 4 (severe): Secondary | ICD-10-CM | POA: Diagnosis not present

## 2020-11-07 ENCOUNTER — Encounter (HOSPITAL_COMMUNITY): Payer: Medicare Other

## 2020-11-14 DIAGNOSIS — E8770 Fluid overload, unspecified: Secondary | ICD-10-CM | POA: Diagnosis not present

## 2020-11-14 DIAGNOSIS — I472 Ventricular tachycardia: Secondary | ICD-10-CM | POA: Diagnosis not present

## 2020-11-14 DIAGNOSIS — I5040 Unspecified combined systolic (congestive) and diastolic (congestive) heart failure: Secondary | ICD-10-CM | POA: Diagnosis not present

## 2020-11-14 DIAGNOSIS — N184 Chronic kidney disease, stage 4 (severe): Secondary | ICD-10-CM | POA: Diagnosis not present

## 2020-11-14 DIAGNOSIS — I272 Pulmonary hypertension, unspecified: Secondary | ICD-10-CM | POA: Diagnosis not present

## 2020-11-18 ENCOUNTER — Other Ambulatory Visit (HOSPITAL_COMMUNITY): Payer: Self-pay | Admitting: Cardiology

## 2020-11-19 DIAGNOSIS — N184 Chronic kidney disease, stage 4 (severe): Secondary | ICD-10-CM | POA: Diagnosis not present

## 2020-11-19 DIAGNOSIS — E039 Hypothyroidism, unspecified: Secondary | ICD-10-CM | POA: Diagnosis not present

## 2020-11-19 DIAGNOSIS — E118 Type 2 diabetes mellitus with unspecified complications: Secondary | ICD-10-CM | POA: Diagnosis not present

## 2020-11-19 DIAGNOSIS — I5189 Other ill-defined heart diseases: Secondary | ICD-10-CM | POA: Diagnosis not present

## 2020-11-19 DIAGNOSIS — I10 Essential (primary) hypertension: Secondary | ICD-10-CM | POA: Diagnosis not present

## 2020-11-19 DIAGNOSIS — E7800 Pure hypercholesterolemia, unspecified: Secondary | ICD-10-CM | POA: Diagnosis not present

## 2020-11-19 DIAGNOSIS — G4733 Obstructive sleep apnea (adult) (pediatric): Secondary | ICD-10-CM | POA: Diagnosis not present

## 2020-11-19 DIAGNOSIS — E876 Hypokalemia: Secondary | ICD-10-CM | POA: Diagnosis not present

## 2020-11-19 DIAGNOSIS — Z8673 Personal history of transient ischemic attack (TIA), and cerebral infarction without residual deficits: Secondary | ICD-10-CM | POA: Diagnosis not present

## 2020-11-19 DIAGNOSIS — Z8616 Personal history of COVID-19: Secondary | ICD-10-CM | POA: Diagnosis not present

## 2020-11-19 DIAGNOSIS — Z6835 Body mass index (BMI) 35.0-35.9, adult: Secondary | ICD-10-CM | POA: Diagnosis not present

## 2020-11-20 ENCOUNTER — Other Ambulatory Visit (HOSPITAL_COMMUNITY): Payer: Self-pay | Admitting: *Deleted

## 2020-11-20 DIAGNOSIS — N184 Chronic kidney disease, stage 4 (severe): Secondary | ICD-10-CM | POA: Diagnosis not present

## 2020-11-20 DIAGNOSIS — I272 Pulmonary hypertension, unspecified: Secondary | ICD-10-CM | POA: Diagnosis not present

## 2020-11-20 DIAGNOSIS — I5040 Unspecified combined systolic (congestive) and diastolic (congestive) heart failure: Secondary | ICD-10-CM | POA: Diagnosis not present

## 2020-11-20 DIAGNOSIS — E8770 Fluid overload, unspecified: Secondary | ICD-10-CM | POA: Diagnosis not present

## 2020-11-20 DIAGNOSIS — I472 Ventricular tachycardia: Secondary | ICD-10-CM | POA: Diagnosis not present

## 2020-11-20 MED ORDER — SILDENAFIL CITRATE 20 MG PO TABS: ORAL_TABLET | ORAL | 6 refills | Status: AC

## 2020-11-23 DIAGNOSIS — E1122 Type 2 diabetes mellitus with diabetic chronic kidney disease: Secondary | ICD-10-CM | POA: Diagnosis not present

## 2020-11-23 DIAGNOSIS — I130 Hypertensive heart and chronic kidney disease with heart failure and stage 1 through stage 4 chronic kidney disease, or unspecified chronic kidney disease: Secondary | ICD-10-CM | POA: Diagnosis not present

## 2020-11-23 DIAGNOSIS — N184 Chronic kidney disease, stage 4 (severe): Secondary | ICD-10-CM | POA: Diagnosis not present

## 2020-11-23 DIAGNOSIS — I5042 Chronic combined systolic (congestive) and diastolic (congestive) heart failure: Secondary | ICD-10-CM | POA: Diagnosis not present

## 2020-11-24 DIAGNOSIS — I472 Ventricular tachycardia: Secondary | ICD-10-CM | POA: Diagnosis not present

## 2020-11-24 DIAGNOSIS — J302 Other seasonal allergic rhinitis: Secondary | ICD-10-CM | POA: Diagnosis not present

## 2020-11-24 DIAGNOSIS — K219 Gastro-esophageal reflux disease without esophagitis: Secondary | ICD-10-CM | POA: Diagnosis not present

## 2020-11-24 DIAGNOSIS — K648 Other hemorrhoids: Secondary | ICD-10-CM | POA: Diagnosis not present

## 2020-11-24 DIAGNOSIS — I5040 Unspecified combined systolic (congestive) and diastolic (congestive) heart failure: Secondary | ICD-10-CM | POA: Diagnosis not present

## 2020-11-24 DIAGNOSIS — I272 Pulmonary hypertension, unspecified: Secondary | ICD-10-CM | POA: Diagnosis not present

## 2020-11-24 DIAGNOSIS — I679 Cerebrovascular disease, unspecified: Secondary | ICD-10-CM | POA: Diagnosis not present

## 2020-11-24 DIAGNOSIS — K449 Diaphragmatic hernia without obstruction or gangrene: Secondary | ICD-10-CM | POA: Diagnosis not present

## 2020-11-24 DIAGNOSIS — I071 Rheumatic tricuspid insufficiency: Secondary | ICD-10-CM | POA: Diagnosis not present

## 2020-11-24 DIAGNOSIS — E039 Hypothyroidism, unspecified: Secondary | ICD-10-CM | POA: Diagnosis not present

## 2020-11-24 DIAGNOSIS — M109 Gout, unspecified: Secondary | ICD-10-CM | POA: Diagnosis not present

## 2020-11-24 DIAGNOSIS — E8770 Fluid overload, unspecified: Secondary | ICD-10-CM | POA: Diagnosis not present

## 2020-11-24 DIAGNOSIS — N184 Chronic kidney disease, stage 4 (severe): Secondary | ICD-10-CM | POA: Diagnosis not present

## 2020-11-24 DIAGNOSIS — I252 Old myocardial infarction: Secondary | ICD-10-CM | POA: Diagnosis not present

## 2020-12-25 DEATH — deceased
